# Patient Record
Sex: Female | Born: 1950 | Race: White | Hispanic: No | Marital: Married | Smoking: Former smoker
Health system: Southern US, Community
[De-identification: ages and names within clinical notes are randomized; demographics above are authoritative.]

## PROBLEM LIST (undated history)

## (undated) DIAGNOSIS — I2510 Atherosclerotic heart disease of native coronary artery without angina pectoris: Secondary | ICD-10-CM

## (undated) DIAGNOSIS — D649 Anemia, unspecified: Secondary | ICD-10-CM

## (undated) DIAGNOSIS — F419 Anxiety disorder, unspecified: Secondary | ICD-10-CM

## (undated) DIAGNOSIS — M1990 Unspecified osteoarthritis, unspecified site: Secondary | ICD-10-CM

## (undated) DIAGNOSIS — D689 Coagulation defect, unspecified: Secondary | ICD-10-CM

## (undated) DIAGNOSIS — J449 Chronic obstructive pulmonary disease, unspecified: Secondary | ICD-10-CM

## (undated) DIAGNOSIS — I10 Essential (primary) hypertension: Secondary | ICD-10-CM

## (undated) DIAGNOSIS — I739 Peripheral vascular disease, unspecified: Secondary | ICD-10-CM

## (undated) HISTORY — DX: Anxiety disorder, unspecified: F41.9

## (undated) HISTORY — PX: TRIGGER FINGER RELEASE: SHX641

## (undated) HISTORY — DX: Chronic obstructive pulmonary disease, unspecified: J44.9

## (undated) HISTORY — PX: TUBAL LIGATION: SHX77

## (undated) HISTORY — DX: Anemia, unspecified: D64.9

## (undated) HISTORY — DX: Coagulation defect, unspecified: D68.9

## (undated) HISTORY — DX: Atherosclerotic heart disease of native coronary artery without angina pectoris: I25.10

## (undated) HISTORY — PX: TOOTH EXTRACTION: SUR596

## (undated) HISTORY — DX: Peripheral vascular disease, unspecified: I73.9

---

## 1997-06-24 ENCOUNTER — Other Ambulatory Visit: Admission: RE | Admit: 1997-06-24 | Discharge: 1997-06-24 | Payer: Self-pay | Admitting: Gynecology

## 1998-01-04 ENCOUNTER — Other Ambulatory Visit: Admission: RE | Admit: 1998-01-04 | Discharge: 1998-01-04 | Payer: Self-pay | Admitting: Gynecology

## 1998-07-21 ENCOUNTER — Other Ambulatory Visit: Admission: RE | Admit: 1998-07-21 | Discharge: 1998-07-21 | Payer: Self-pay | Admitting: Gynecology

## 1999-07-24 ENCOUNTER — Other Ambulatory Visit: Admission: RE | Admit: 1999-07-24 | Discharge: 1999-07-24 | Payer: Self-pay | Admitting: Gynecology

## 1999-08-07 ENCOUNTER — Other Ambulatory Visit: Admission: RE | Admit: 1999-08-07 | Discharge: 1999-08-07 | Payer: Self-pay | Admitting: Gynecology

## 2000-08-20 ENCOUNTER — Other Ambulatory Visit: Admission: RE | Admit: 2000-08-20 | Discharge: 2000-08-20 | Payer: Self-pay | Admitting: Gynecology

## 2001-10-16 ENCOUNTER — Other Ambulatory Visit: Admission: RE | Admit: 2001-10-16 | Discharge: 2001-10-16 | Payer: Self-pay | Admitting: Gynecology

## 2001-12-09 ENCOUNTER — Encounter: Payer: Self-pay | Admitting: Family Medicine

## 2001-12-09 ENCOUNTER — Ambulatory Visit (HOSPITAL_COMMUNITY): Admission: RE | Admit: 2001-12-09 | Discharge: 2001-12-09 | Payer: Self-pay | Admitting: Family Medicine

## 2002-11-09 ENCOUNTER — Other Ambulatory Visit: Admission: RE | Admit: 2002-11-09 | Discharge: 2002-11-09 | Payer: Self-pay | Admitting: Gynecology

## 2003-01-07 ENCOUNTER — Encounter: Payer: Self-pay | Admitting: Family Medicine

## 2003-01-07 ENCOUNTER — Ambulatory Visit (HOSPITAL_COMMUNITY): Admission: RE | Admit: 2003-01-07 | Discharge: 2003-01-07 | Payer: Self-pay | Admitting: Family Medicine

## 2003-03-31 ENCOUNTER — Ambulatory Visit (HOSPITAL_COMMUNITY): Admission: RE | Admit: 2003-03-31 | Discharge: 2003-03-31 | Payer: Self-pay | Admitting: Family Medicine

## 2003-11-08 ENCOUNTER — Other Ambulatory Visit: Admission: RE | Admit: 2003-11-08 | Discharge: 2003-11-08 | Payer: Self-pay | Admitting: Gynecology

## 2004-01-31 ENCOUNTER — Ambulatory Visit (HOSPITAL_COMMUNITY): Admission: RE | Admit: 2004-01-31 | Discharge: 2004-01-31 | Payer: Self-pay | Admitting: Family Medicine

## 2004-02-21 ENCOUNTER — Ambulatory Visit: Payer: Self-pay | Admitting: Internal Medicine

## 2004-02-23 ENCOUNTER — Ambulatory Visit (HOSPITAL_COMMUNITY): Admission: RE | Admit: 2004-02-23 | Discharge: 2004-02-23 | Payer: Self-pay | Admitting: Internal Medicine

## 2004-02-23 HISTORY — PX: COLONOSCOPY: SHX174

## 2004-05-08 ENCOUNTER — Ambulatory Visit (HOSPITAL_COMMUNITY): Admission: RE | Admit: 2004-05-08 | Discharge: 2004-05-08 | Payer: Self-pay | Admitting: Family Medicine

## 2004-08-09 ENCOUNTER — Ambulatory Visit (HOSPITAL_COMMUNITY): Admission: RE | Admit: 2004-08-09 | Discharge: 2004-08-09 | Payer: Self-pay | Admitting: Family Medicine

## 2004-12-21 ENCOUNTER — Other Ambulatory Visit: Admission: RE | Admit: 2004-12-21 | Discharge: 2004-12-21 | Payer: Self-pay | Admitting: Gynecology

## 2005-12-11 ENCOUNTER — Ambulatory Visit (HOSPITAL_COMMUNITY): Admission: RE | Admit: 2005-12-11 | Discharge: 2005-12-11 | Payer: Self-pay | Admitting: Family Medicine

## 2006-04-22 ENCOUNTER — Ambulatory Visit (HOSPITAL_COMMUNITY): Admission: RE | Admit: 2006-04-22 | Discharge: 2006-04-22 | Payer: Self-pay | Admitting: Family Medicine

## 2006-11-20 ENCOUNTER — Ambulatory Visit (HOSPITAL_COMMUNITY): Admission: RE | Admit: 2006-11-20 | Discharge: 2006-11-20 | Payer: Self-pay | Admitting: Family Medicine

## 2009-02-21 ENCOUNTER — Ambulatory Visit: Payer: Self-pay | Admitting: Cardiology

## 2009-10-11 ENCOUNTER — Encounter (HOSPITAL_COMMUNITY): Admission: RE | Admit: 2009-10-11 | Discharge: 2009-11-10 | Payer: Self-pay | Admitting: Family Medicine

## 2009-11-22 ENCOUNTER — Ambulatory Visit (HOSPITAL_COMMUNITY): Admission: RE | Admit: 2009-11-22 | Discharge: 2009-11-22 | Payer: Self-pay | Admitting: Family Medicine

## 2010-02-23 ENCOUNTER — Ambulatory Visit (HOSPITAL_COMMUNITY): Admission: RE | Admit: 2010-02-23 | Discharge: 2010-02-23 | Payer: Self-pay | Admitting: Family Medicine

## 2010-08-11 NOTE — Op Note (Signed)
Desiree Barber, Desiree Barber             ACCOUNT NO.:  [id number]   MEDICAL RECORD NO.:  [id number]          PATIENT TYPE:  AMB   LOCATION:  DAY                           FACILITY:  [facility]   PHYSICIAN:  R. Roetta Sessions, M.D. DATE OF BIRTH:  25-Feb-1951   DATE OF PROCEDURE:  02/23/2004  DATE OF DISCHARGE:                                 OPERATIVE REPORT   PROCEDURE:  Screening colonoscopy.   INDICATIONS FOR PROCEDURE:  A 60 year old lady sent over at the courtesy of  Dr. Phillips Odor for colorectal cancer screening.  She has no GI symptoms.  There  is no family history of colorectal neoplasia.  She has never had a  colonoscopy or other imaging of her colon.  This colonoscopy is being done  as a standard screening maneuver.  This approach has been discussed with the  patient at length.  Potential risks, benefits, and alternatives have been  reviewed, questions answered.  She is agreeable.   PROCEDURE NOTE:  O2 saturations, blood pressure, pulse, and respirations  were monitored throughout the entire procedure.   CONSCIOUS SEDATION:  Versed and Demerol in incremental doses intravenously.   INSTRUMENT:  The Olympus video chip system (pediatric scope).   FINDINGS:  Digital rectal exam revealed a somewhat lax sphincter tone.   ENDOSCOPIC FINDINGS:  Prep was adequate.   RECTUM:  Examination of rectal mucosa including retroflexed view of the anal  verge revealed no abnormalities.   COLON:  Colonic mucosa was surveyed from the rectosigmoid junction through  the left transverse and right colon to the area of the appendiceal orifice,  ileocecal valve, and cecum.  These structures were well-seen, photographed  for the record.  From this level, the scope was slowly withdrawn.  All  previously mentioned mucosal surfaces were again seen.  The colonic mucosa  appeared normal.  The patient tolerated the procedure well, was reacted in  endoscopy.   IMPRESSION:  1.  Normal rectum aside from internal  hemorrhoids (minimal).  2.  Normal colon.   RECOMMENDATIONS:  Repeat colonoscopy 10 years.     Otelia Sergeant   RMR/MEDQ  D:  02/23/2004  T:  02/23/2004  Job:  [id number]   cc:   Corrie Mckusick, M.D.  Fax: [phone]

## 2012-06-23 ENCOUNTER — Other Ambulatory Visit (HOSPITAL_COMMUNITY): Payer: Self-pay | Admitting: Family Medicine

## 2012-06-23 ENCOUNTER — Ambulatory Visit (HOSPITAL_COMMUNITY)
Admission: RE | Admit: 2012-06-23 | Discharge: 2012-06-23 | Disposition: A | Discharge status: Home / Self Care | Payer: Managed Care, Other (non HMO) | Source: Ambulatory Visit | Attending: Family Medicine | Admitting: Family Medicine

## 2012-06-23 DIAGNOSIS — R509 Fever, unspecified: Secondary | ICD-10-CM | POA: Insufficient documentation

## 2012-06-23 DIAGNOSIS — J984 Other disorders of lung: Secondary | ICD-10-CM | POA: Insufficient documentation

## 2012-06-23 DIAGNOSIS — J069 Acute upper respiratory infection, unspecified: Secondary | ICD-10-CM | POA: Insufficient documentation

## 2012-06-23 DIAGNOSIS — R05 Cough: Secondary | ICD-10-CM | POA: Insufficient documentation

## 2012-06-23 DIAGNOSIS — R059 Cough, unspecified: Secondary | ICD-10-CM | POA: Insufficient documentation

## 2013-08-11 ENCOUNTER — Encounter (INDEPENDENT_AMBULATORY_CARE_PROVIDER_SITE_OTHER): Payer: Self-pay

## 2013-08-11 ENCOUNTER — Other Ambulatory Visit (HOSPITAL_COMMUNITY): Payer: Self-pay | Admitting: Family Medicine

## 2013-08-11 ENCOUNTER — Ambulatory Visit (HOSPITAL_COMMUNITY)
Admission: RE | Admit: 2013-08-11 | Discharge: 2013-08-11 | Disposition: A | Discharge status: Home / Self Care | Payer: Managed Care, Other (non HMO) | Source: Ambulatory Visit | Attending: Family Medicine | Admitting: Family Medicine

## 2013-08-11 DIAGNOSIS — J449 Chronic obstructive pulmonary disease, unspecified: Secondary | ICD-10-CM

## 2013-08-11 DIAGNOSIS — Z87891 Personal history of nicotine dependence: Secondary | ICD-10-CM | POA: Insufficient documentation

## 2013-08-11 DIAGNOSIS — R05 Cough: Secondary | ICD-10-CM | POA: Insufficient documentation

## 2013-08-11 DIAGNOSIS — R0602 Shortness of breath: Secondary | ICD-10-CM | POA: Insufficient documentation

## 2013-08-11 DIAGNOSIS — R059 Cough, unspecified: Secondary | ICD-10-CM | POA: Insufficient documentation

## 2015-04-28 ENCOUNTER — Telehealth: Payer: Self-pay

## 2015-04-28 NOTE — Telephone Encounter (Signed)
Pt called to say that she can't seem to get a hold of DS and was wanting to get her procedure scheduled. I told her that DS said to give her 10-15 minutes that she would try to call her back that she couldn't triage at the moment. Pt said that she and husband are waiting to go out, but would wait for DS to call back first. Please call her at [phone]

## 2015-04-28 NOTE — Telephone Encounter (Signed)
See separate triage.  

## 2015-05-03 ENCOUNTER — Telehealth: Payer: Self-pay

## 2015-05-03 NOTE — Telephone Encounter (Signed)
Gastroenterology Pre-Procedure Review  Request Date:04/28/2015 Requesting Physician: Delman Cheadle, PA  PATIENT REVIEW QUESTIONS: The patient responded to the following health history questions as indicated:    1. Diabetes Melitis: no 2. Joint replacements in the past 12 months: no 3. Major health problems in the past 3 months: no 4. Has an artificial valve or MVP: no 5. Has a defibrillator: no 6. Has been advised in past to take antibiotics in advance of a procedure like teeth cleaning: no 7. Family history of colon cancer: no  8. Alcohol Use: Drinks about 3 beers daily 9. History of sleep apnea: no     MEDICATIONS & ALLERGIES:    Patient reports the following regarding taking any blood thinners:   Plavix? no Aspirin? no Coumadin? no  Patient confirms/reports the following medications:  Current Outpatient Prescriptions  Medication Sig Dispense Refill  . ALPRAZolam (XANAX) 0.25 MG tablet Take 0.25 mg by mouth at bedtime as needed for anxiety.    Marland Kitchen tiotropium (SPIRIVA) 18 MCG inhalation capsule Place 18 mcg into inhaler and inhale daily.     No current facility-administered medications for this visit.    Patient confirms/reports the following allergies:  Allergies  Allergen Reactions  . Demerol [Meperidine]   . Oxycontin [Oxycodone Hcl]     No orders of the defined types were placed in this encounter.    AUTHORIZATION INFORMATION Primary Insurance:  ID #:   Group #:  Pre-Cert / Auth required:  Pre-Cert / Auth #:   Secondary Insurance:   ID #:   Group #:  Pre-Cert / Auth required:  Pre-Cert / Auth #:   SCHEDULE INFORMATION: Procedure has been scheduled as follows:  Date:             Time:  Location:   This Gastroenterology Pre-Precedure Review Form is being routed to the following provider(s): R. Garfield Cornea, MD

## 2015-05-04 NOTE — Telephone Encounter (Signed)
Pt said she drinks 12 oz beers and now she says that it's probably 1-2 a day and sometimes not any. She just likes the taste of the beer. She has been doing this for about 20 years.  She said she did not have any problems with sedation before, but she did get deathly sick on the Demerol. She said pain meds make her very sick on her stomach.  Please advise!

## 2015-05-04 NOTE — Telephone Encounter (Signed)
Please find out what size beers and how long she has been doing this. Did she have any issues with not being sedated adequately with last TCS?

## 2015-05-04 NOTE — Telephone Encounter (Signed)
Let's bring her in to discuss her sedation. May be best as a proprofol candidate.

## 2015-05-06 NOTE — Telephone Encounter (Signed)
Pt is aware and appt scheduled with Walden Field, NP on 05/18/2015 at 2:30 PM.

## 2015-05-18 ENCOUNTER — Ambulatory Visit: Payer: Managed Care, Other (non HMO) | Admitting: Nurse Practitioner

## 2015-05-18 ENCOUNTER — Ambulatory Visit (INDEPENDENT_AMBULATORY_CARE_PROVIDER_SITE_OTHER): Payer: BLUE CROSS/BLUE SHIELD | Admitting: Nurse Practitioner

## 2015-05-18 ENCOUNTER — Other Ambulatory Visit: Payer: Self-pay

## 2015-05-18 ENCOUNTER — Encounter: Payer: Self-pay | Admitting: Nurse Practitioner

## 2015-05-18 VITALS — BP 174/91 | HR 79 | Temp 97.4°F | Ht 64.0 in | Wt 103.8 lb

## 2015-05-18 DIAGNOSIS — Z1211 Encounter for screening for malignant neoplasm of colon: Secondary | ICD-10-CM | POA: Diagnosis not present

## 2015-05-18 DIAGNOSIS — Z886 Allergy status to analgesic agent status: Secondary | ICD-10-CM | POA: Insufficient documentation

## 2015-05-18 MED ORDER — PEG 3350-KCL-NA BICARB-NACL 420 G PO SOLR: 4000.0000 mL | Freq: Once | ORAL | Status: DC

## 2015-05-18 NOTE — Assessment & Plan Note (Signed)
Patient due for routine screening colonoscopy. Was offered an office visit due to daily alcohol consumption and allergy to Demerol. States she drinks about 2-3 beers a day for the past 20 years. Is also currently on Xanax. Generally asymptomatic from a GI standpoint. We will proceed with screening colonoscopy on propofol.  Proceed with TCS in the OR with propofol/MAC with Dr. Gala Romney in near future: the risks, benefits, and alternatives have been discussed with the patient in detail. The patient states understanding and desires to proceed.  The patient is not on any anticoagulants, chronic pain medications, or antidepressants. Daily alcohol consumption and Xanax along with allergy Demerol. We'll plan for the procedure and the OR on propofol/MAC to remove issue with allergy as well as promote adequate sedation given polypharmacy and daily alcohol consumption.

## 2015-05-18 NOTE — Progress Notes (Signed)
cc'ed to pcp °

## 2015-05-18 NOTE — Patient Instructions (Signed)
1. We will schedule your procedure for you. 2. Further recommendations to be based on results of your procedure. 3. Return for follow-up as needed for GI symptoms or based on post procedure recommendations.

## 2015-05-18 NOTE — Progress Notes (Signed)
Primary Care Physician:  Purvis Kilts, MD Primary Gastroenterologist:  Dr. Gala Romney   Chief Complaint  Patient presents with  . Colonoscopy    HPI:   Desiree Barber is a 65 y.o. female who presents to schedule colonoscopy. Phone triage was initially attempted but she was referred for office visit due to daily beer consumption and reaction to Demerol including nausea and vomiting. Previous colonoscopy completed 02/23/2004 which found normal rectum aside from internal hemorrhoids, normal colon. Recommend repeat colonoscopy in 10 years (2015). She is 2 years overdue for her repeat colonoscopy.  Today she states she's allergic to Demerol and has severe N/V on it. Denies abdominal pain, N/V, hematochezia, melena, unintentional weight loss, fever, chills, changes in bowel habits. Denies chest pain, dyspnea, dizziness, lightheadedness, syncope, near syncope. Denies any other upper or lower GI symptoms.  Past Medical History  Diagnosis Date  . COPD (chronic obstructive pulmonary disease) ([place])   . Anxiety     Occasional    Past Surgical History  Procedure Laterality Date  . Colonoscopy  02/23/04    RMR: normal rectum and colon.minimal internal hemorrhoids    Current Outpatient Prescriptions  Medication Sig Dispense Refill  . ALPRAZolam (XANAX) 0.25 MG tablet Take 0.25 mg by mouth at bedtime as needed for anxiety.    Marland Kitchen tiotropium (SPIRIVA) 18 MCG inhalation capsule Place 18 mcg into inhaler and inhale daily.     No current facility-administered medications for this visit.    Allergies as of 05/18/2015 - Review Complete 05/18/2015  Allergen Reaction Noted  . Demerol [meperidine]  05/03/2015  . Oxycontin [oxycodone hcl]  05/03/2015    Family History  Problem Relation Age of Onset  . Colon cancer Neg Hx     Social History   Social History  . Marital Status: Married    Spouse Name: N/A  . Number of Children: N/A  . Years of Education: N/A   Occupational History    . Not on file.   Social History Main Topics  . Smoking status: Current Every Day Smoker -- 0.50 packs/day    Types: Cigarettes  . Smokeless tobacco: Never Used  . Alcohol Use: 0.0 oz/week    0 Standard drinks or equivalent per week     Comment: 3 beers a day  . Drug Use: No  . Sexual Activity: Not on file   Other Topics Concern  . Not on file   Social History Narrative    Review of Systems: General: Negative for anorexia, weight loss, fever, chills, fatigue, weakness. Eyes: Negative for vision changes.  ENT: Negative for hoarseness, difficulty swallowing. CV: Negative for chest pain, angina, palpitations, peripheral edema.  Respiratory: Negative for dyspnea at rest, cough, sputum, wheezing.  GI: See history of present illness. Derm: Negative for rash or itching.  Endo: Negative for unusual weight change.  Heme: Negative for bruising or bleeding. Allergy: Negative for rash or hives.    Physical Exam: BP 174/91 mmHg  Pulse 79  Temp(Src) 97.4 F (36.3 C) (Oral)  Ht 5\' 4"  (1.626 m)  Wt 103 lb 12.8 oz (47.083 kg)  BMI 17.81 kg/m2 General:   Alert and oriented. Pleasant and cooperative. Well-nourished and well-developed.  Head:  Normocephalic and atraumatic. Eyes:  Without icterus, sclera clear and conjunctiva pink.  Ears:  Normal auditory acuity. Cardiovascular:  S1, S2 present without murmurs appreciated. Extremities without clubbing or edema. Respiratory:  Clear to auscultation bilaterally. No wheezes, rales, or rhonchi. No distress.  Gastrointestinal:  +  BS, soft, non-tender and non-distended. No HSM noted. No guarding or rebound. No masses appreciated.  Rectal:  Deferred  Musculoskalatal:  Symmetrical without gross deformities. Neurologic:  Alert and oriented x4;  grossly normal neurologically. Psych:  Alert and cooperative. Normal mood and affect. Heme/Lymph/Immune: No excessive bruising noted.    05/18/2015 2:45 PM

## 2015-05-19 ENCOUNTER — Telehealth: Payer: Self-pay

## 2015-05-19 NOTE — Telephone Encounter (Signed)
Per Elta Guadeloupe at [employer] is required

## 2015-06-07 NOTE — Patient Instructions (Signed)
Desiree Barber  06/07/2015     @PREFPERIOPPHARMACY @   Your procedure is scheduled on  06/13/2015  Report to [facility] at  915  A.M.  Call this number if you have problems the morning of surgery:  [phone]   Remember:  Do not eat food or drink liquids after midnight.  Take these medicines the morning of surgery with A SIP OF WATER  Xanax. Take your spiriva before you come.   Do not wear jewelry, make-up or nail polish.  Do not wear lotions, powders, or perfumes.  You may wear deodorant.  Do not shave 48 hours prior to surgery.  Men may shave face and neck.  Do not bring valuables to the hospital.  [facility] is not responsible for any belongings or valuables.  Contacts, dentures or bridgework may not be worn into surgery.  Leave your suitcase in the car.  After surgery it may be brought to your room.  For patients admitted to the hospital, discharge time will be determined by your treatment team.  Patients discharged the day of surgery will not be allowed to drive home.   Name and phone number of your driver:   family Special instructions:  Follow the diet and prep instructions given to you by Dr Roseanne Kaufman office.  Please read over the following fact sheets that you were given. Coughing and Deep Breathing, Surgical Site Infection Prevention, Anesthesia Post-op Instructions and Care and Recovery After Surgery      Colonoscopy A colonoscopy is an exam to look at the entire large intestine (colon). This exam can help find problems such as tumors, polyps, inflammation, and areas of bleeding. The exam takes about 1 hour.  LET [facility] CARE PROVIDER KNOW ABOUT:   Any allergies you have.  All medicines you are taking, including vitamins, herbs, eye drops, creams, and over-the-counter medicines.  Previous problems you or members of your family have had with the use of anesthetics.  Any blood disorders you have.  Previous surgeries you have had.  Medical  conditions you have. RISKS AND COMPLICATIONS  Generally, this is a safe procedure. However, as with any procedure, complications can occur. Possible complications include:  Bleeding.  Tearing or rupture of the colon wall.  Reaction to medicines given during the exam.  Infection (rare). BEFORE THE PROCEDURE   Ask your health care provider about changing or stopping your regular medicines.  You may be prescribed an oral bowel prep. This involves drinking a large amount of medicated liquid, starting the day before your procedure. The liquid will cause you to have multiple loose stools until your stool is almost clear or light green. This cleans out your colon in preparation for the procedure.  Do not eat or drink anything else once you have started the bowel prep, unless your health care provider tells you it is safe to do so.  Arrange for someone to drive you home after the procedure. PROCEDURE   You will be given medicine to help you relax (sedative).  You will lie on your side with your knees bent.  A long, flexible tube with a light and camera on the end (colonoscope) will be inserted through the rectum and into the colon. The camera sends video back to a computer screen as it moves through the colon. The colonoscope also releases carbon dioxide gas to inflate the colon. This helps your health care provider see the area better.  During the exam,  your health care provider may take a small tissue sample (biopsy) to be examined under a microscope if any abnormalities are found.  The exam is finished when the entire colon has been viewed. AFTER THE PROCEDURE   Do not drive for 24 hours after the exam.  You may have a small amount of blood in your stool.  You may pass moderate amounts of gas and have mild abdominal cramping or bloating. This is caused by the gas used to inflate your colon during the exam.  Ask when your test results will be ready and how you will get your results.  Make sure you get your test results.   This information is not intended to replace advice given to you by your health care provider. Make sure you discuss any questions you have with your health care provider.   Document Released: 03/09/2000 Document Revised: 12/31/2012 Document Reviewed: 11/17/2012 Elsevier Interactive Patient Education 2016 [employer] Colonoscopy, Care After Refer to this sheet in the next few weeks. These instructions provide you with information on caring for yourself after your procedure. Your health care provider may also give you more specific instructions. Your treatment has been planned according to current medical practices, but problems sometimes occur. Call your health care provider if you have any problems or questions after your procedure. WHAT TO EXPECT AFTER THE PROCEDURE  After your procedure, it is typical to have the following:  A small amount of blood in your stool.  Moderate amounts of gas and mild abdominal cramping or bloating. HOME CARE INSTRUCTIONS  Do not drive, operate machinery, or sign important documents for 24 hours.  You may shower and resume your regular physical activities, but move at a slower pace for the first 24 hours.  Take frequent rest periods for the first 24 hours.  Walk around or put a warm pack on your abdomen to help reduce abdominal cramping and bloating.  Drink enough fluids to keep your urine clear or pale yellow.  You may resume your normal diet as instructed by your health care provider. Avoid heavy or fried foods that are hard to digest.  Avoid drinking alcohol for 24 hours or as instructed by your health care provider.  Only take over-the-counter or prescription medicines as directed by your health care provider.  If a tissue sample (biopsy) was taken during your procedure:  Do not take aspirin or blood thinners for 7 days, or as instructed by your health care provider.  Do not drink alcohol for 7 days, or  as instructed by your health care provider.  Eat soft foods for the first 24 hours. SEEK MEDICAL CARE IF: You have persistent spotting of blood in your stool 2-3 days after the procedure. SEEK IMMEDIATE MEDICAL CARE IF:  You have more than a small spotting of blood in your stool.  You pass large blood clots in your stool.  Your abdomen is swollen (distended).  You have nausea or vomiting.  You have a fever.  You have increasing abdominal pain that is not relieved with medicine.   This information is not intended to replace advice given to you by your health care provider. Make sure you discuss any questions you have with your health care provider.   Document Released: 10/25/2003 Document Revised: 12/31/2012 Document Reviewed: 11/17/2012 Elsevier Interactive Patient Education 2016 Elsevier Inc[employer] PATIENT INSTRUCTIONS POST-ANESTHESIA  IMMEDIATELY FOLLOWING SURGERY:  Do not drive or operate machinery for the first twenty four hours after surgery.  Do not make any  important decisions for twenty four hours after surgery or while taking narcotic pain medications or sedatives.  If you develop intractable nausea and vomiting or a severe headache please notify your doctor immediately.  FOLLOW-UP:  Please make an appointment with your surgeon as instructed. You do not need to follow up with anesthesia unless specifically instructed to do so.  WOUND CARE INSTRUCTIONS (if applicable):  Keep a dry clean dressing on the anesthesia/puncture wound site if there is drainage.  Once the wound has quit draining you may leave it open to air.  Generally you should leave the bandage intact for twenty four hours unless there is drainage.  If the epidural site drains for more than 36-48 hours please call the anesthesia department.  QUESTIONS?:  Please feel free to call your physician or the hospital operator if you have any questions, and they will be happy to assist you.

## 2015-06-08 ENCOUNTER — Other Ambulatory Visit: Payer: Self-pay

## 2015-06-08 ENCOUNTER — Encounter (HOSPITAL_COMMUNITY): Payer: Self-pay

## 2015-06-08 ENCOUNTER — Encounter (HOSPITAL_COMMUNITY)
Admission: RE | Admit: 2015-06-08 | Discharge: 2015-06-08 | Disposition: A | Discharge status: Home / Self Care | Payer: BLUE CROSS/BLUE SHIELD | Source: Ambulatory Visit | Attending: Internal Medicine | Admitting: Internal Medicine

## 2015-06-08 DIAGNOSIS — Z01810 Encounter for preprocedural cardiovascular examination: Secondary | ICD-10-CM | POA: Insufficient documentation

## 2015-06-08 DIAGNOSIS — Z01812 Encounter for preprocedural laboratory examination: Secondary | ICD-10-CM | POA: Diagnosis present

## 2015-06-08 LAB — CBC WITH DIFFERENTIAL/PLATELET
BASOS ABS: 0 10*3/uL (ref 0.0–0.1)
BASOS PCT: 0 %
Eosinophils Absolute: 0.2 10*3/uL (ref 0.0–0.7)
Eosinophils Relative: 2 %
HEMATOCRIT: 49.2 % — AB (ref 36.0–46.0)
HEMOGLOBIN: 16 g/dL — AB (ref 12.0–15.0)
LYMPHS PCT: 17 %
Lymphs Abs: 1.7 10*3/uL (ref 0.7–4.0)
MCH: 32 pg (ref 26.0–34.0)
MCHC: 32.5 g/dL (ref 30.0–36.0)
MCV: 98.4 fL (ref 78.0–100.0)
MONOS PCT: 10 %
Monocytes Absolute: 0.9 10*3/uL (ref 0.1–1.0)
NEUTROS ABS: 6.8 10*3/uL (ref 1.7–7.7)
NEUTROS PCT: 71 %
Platelets: 347 10*3/uL (ref 150–400)
RBC: 5 MIL/uL (ref 3.87–5.11)
RDW: 13.6 % (ref 11.5–15.5)
WBC: 9.5 10*3/uL (ref 4.0–10.5)

## 2015-06-08 LAB — BASIC METABOLIC PANEL
ANION GAP: 6 (ref 5–15)
BUN: 6 mg/dL (ref 6–20)
CHLORIDE: 102 mmol/L (ref 101–111)
CO2: 29 mmol/L (ref 22–32)
Calcium: 8.7 mg/dL — ABNORMAL LOW (ref 8.9–10.3)
Creatinine, Ser: 0.58 mg/dL (ref 0.44–1.00)
GFR calc non Af Amer: >60 mL/min (ref >60)
Glucose, Bld: 85 mg/dL (ref 65–99)
POTASSIUM: 4.4 mmol/L (ref 3.5–5.1)
Sodium: 137 mmol/L (ref 135–145)

## 2015-06-08 NOTE — Pre-Procedure Instructions (Signed)
Patient given information to sign up for my chart at home. 

## 2015-06-13 ENCOUNTER — Ambulatory Visit (HOSPITAL_COMMUNITY): Payer: BLUE CROSS/BLUE SHIELD | Admitting: Anesthesiology

## 2015-06-13 ENCOUNTER — Encounter (HOSPITAL_COMMUNITY): Payer: Self-pay | Admitting: *Deleted

## 2015-06-13 ENCOUNTER — Ambulatory Visit (HOSPITAL_COMMUNITY)
Admission: RE | Admit: 2015-06-13 | Discharge: 2015-06-13 | Disposition: A | Discharge status: Home / Self Care | Payer: BLUE CROSS/BLUE SHIELD | Source: Ambulatory Visit | Attending: Internal Medicine | Admitting: Internal Medicine

## 2015-06-13 ENCOUNTER — Encounter (HOSPITAL_COMMUNITY)
Admission: RE | Disposition: A | Discharge status: Home / Self Care | Payer: Self-pay | Source: Ambulatory Visit | Attending: Internal Medicine

## 2015-06-13 DIAGNOSIS — Z1211 Encounter for screening for malignant neoplasm of colon: Secondary | ICD-10-CM | POA: Insufficient documentation

## 2015-06-13 DIAGNOSIS — Z86010 Personal history of colonic polyps: Secondary | ICD-10-CM | POA: Insufficient documentation

## 2015-06-13 DIAGNOSIS — D125 Benign neoplasm of sigmoid colon: Secondary | ICD-10-CM | POA: Insufficient documentation

## 2015-06-13 DIAGNOSIS — Z79899 Other long term (current) drug therapy: Secondary | ICD-10-CM | POA: Diagnosis not present

## 2015-06-13 DIAGNOSIS — F419 Anxiety disorder, unspecified: Secondary | ICD-10-CM | POA: Insufficient documentation

## 2015-06-13 DIAGNOSIS — J449 Chronic obstructive pulmonary disease, unspecified: Secondary | ICD-10-CM | POA: Insufficient documentation

## 2015-06-13 DIAGNOSIS — K5730 Diverticulosis of large intestine without perforation or abscess without bleeding: Secondary | ICD-10-CM | POA: Diagnosis not present

## 2015-06-13 DIAGNOSIS — Z888 Allergy status to other drugs, medicaments and biological substances status: Secondary | ICD-10-CM | POA: Diagnosis not present

## 2015-06-13 HISTORY — PX: COLONOSCOPY WITH PROPOFOL: SHX5780

## 2015-06-13 HISTORY — PX: POLYPECTOMY: SHX5525

## 2015-06-13 SURGERY — COLONOSCOPY WITH PROPOFOL
Anesthesia: Monitor Anesthesia Care

## 2015-06-13 MED ORDER — DEXAMETHASONE SODIUM PHOSPHATE 4 MG/ML IJ SOLN
4.0000 mg | Freq: Once | INTRAMUSCULAR | Status: AC
  Administered 2015-06-13: 4 mg via INTRAVENOUS
  Filled 2015-06-13: qty 1

## 2015-06-13 MED ORDER — FENTANYL CITRATE (PF) 100 MCG/2ML IJ SOLN: 25.0000 ug | INTRAMUSCULAR | Status: DC | PRN

## 2015-06-13 MED ORDER — GLYCOPYRROLATE 0.2 MG/ML IJ SOLN
0.2000 mg | Freq: Once | INTRAMUSCULAR | Status: AC
  Administered 2015-06-13: 0.2 mg via INTRAVENOUS

## 2015-06-13 MED ORDER — PROPOFOL 10 MG/ML IV BOLUS
INTRAVENOUS | Status: DC | PRN
  Administered 2015-06-13 (×2): 10 mg via INTRAVENOUS
  Administered 2015-06-13: 20 mg via INTRAVENOUS

## 2015-06-13 MED ORDER — PROPOFOL 10 MG/ML IV BOLUS
INTRAVENOUS | Status: AC
  Filled 2015-06-13: qty 40

## 2015-06-13 MED ORDER — DEXAMETHASONE SODIUM PHOSPHATE 4 MG/ML IJ SOLN
INTRAMUSCULAR | Status: AC
  Filled 2015-06-13: qty 1

## 2015-06-13 MED ORDER — MIDAZOLAM HCL 2 MG/2ML IJ SOLN
INTRAMUSCULAR | Status: AC
  Filled 2015-06-13: qty 2

## 2015-06-13 MED ORDER — MIDAZOLAM HCL 2 MG/2ML IJ SOLN
1.0000 mg | INTRAMUSCULAR | Status: DC | PRN
  Administered 2015-06-13: 2 mg via INTRAVENOUS

## 2015-06-13 MED ORDER — ONDANSETRON HCL 4 MG/2ML IJ SOLN
4.0000 mg | Freq: Once | INTRAMUSCULAR | Status: AC
  Administered 2015-06-13: 4 mg via INTRAVENOUS

## 2015-06-13 MED ORDER — PROPOFOL 500 MG/50ML IV EMUL
INTRAVENOUS | Status: DC | PRN
  Administered 2015-06-13: 12:00:00 via INTRAVENOUS
  Administered 2015-06-13: 125 ug/kg/min via INTRAVENOUS

## 2015-06-13 MED ORDER — FENTANYL CITRATE (PF) 100 MCG/2ML IJ SOLN
25.0000 ug | Freq: Once | INTRAMUSCULAR | Status: AC
  Administered 2015-06-13: 25 ug via INTRAVENOUS

## 2015-06-13 MED ORDER — ONDANSETRON HCL 4 MG/2ML IJ SOLN
INTRAMUSCULAR | Status: AC
  Filled 2015-06-13: qty 2

## 2015-06-13 MED ORDER — ONDANSETRON HCL 4 MG/2ML IJ SOLN: 4.0000 mg | Freq: Once | INTRAMUSCULAR | Status: DC | PRN

## 2015-06-13 MED ORDER — GLYCOPYRROLATE 0.2 MG/ML IJ SOLN
INTRAMUSCULAR | Status: AC
  Filled 2015-06-13: qty 1

## 2015-06-13 MED ORDER — FENTANYL CITRATE (PF) 100 MCG/2ML IJ SOLN
INTRAMUSCULAR | Status: AC
  Filled 2015-06-13: qty 2

## 2015-06-13 MED ORDER — LACTATED RINGERS IV SOLN
INTRAVENOUS | Status: DC
  Administered 2015-06-13 (×2): via INTRAVENOUS

## 2015-06-13 NOTE — Anesthesia Postprocedure Evaluation (Signed)
Anesthesia Post Note  Patient: Desiree Barber  Procedure(s) Performed: Procedure(s) (LRB): COLONOSCOPY WITH PROPOFOL (N/A) POLYPECTOMY  Patient location during evaluation: PACU Anesthesia Type: MAC Level of consciousness: awake and alert Pain management: pain level controlled Respiratory status: spontaneous breathing Cardiovascular status: blood pressure returned to baseline Postop Assessment: no signs of nausea or vomiting Anesthetic complications: no    Last Vitals:  Filed Vitals:   06/13/15 1255 06/13/15 1306  BP:  141/57  Pulse: 91 91  Temp:  37.1 C  Resp: 17     Last Pain:  Filed Vitals:   06/13/15 1307  PainSc: 0-No pain                 Mahari Strahm

## 2015-06-13 NOTE — H&P (View-Only) (Signed)
Primary Care Physician:  Purvis Kilts, MD Primary Gastroenterologist:  Dr. Gala Romney   Chief Complaint  Patient presents with  . Colonoscopy    HPI:   Desiree Barber is a 65 y.o. female who presents to schedule colonoscopy. Phone triage was initially attempted but she was referred for office visit due to daily beer consumption and reaction to Demerol including nausea and vomiting. Previous colonoscopy completed 02/23/2004 which found normal rectum aside from internal hemorrhoids, normal colon. Recommend repeat colonoscopy in 10 years (2015). She is 2 years overdue for her repeat colonoscopy.  Today she states she's allergic to Demerol and has severe N/V on it. Denies abdominal pain, N/V, hematochezia, melena, unintentional weight loss, fever, chills, changes in bowel habits. Denies chest pain, dyspnea, dizziness, lightheadedness, syncope, near syncope. Denies any other upper or lower GI symptoms.  Past Medical History  Diagnosis Date  . COPD (chronic obstructive pulmonary disease) ([place])   . Anxiety     Occasional    Past Surgical History  Procedure Laterality Date  . Colonoscopy  02/23/04    RMR: normal rectum and colon.minimal internal hemorrhoids    Current Outpatient Prescriptions  Medication Sig Dispense Refill  . ALPRAZolam (XANAX) 0.25 MG tablet Take 0.25 mg by mouth at bedtime as needed for anxiety.    Marland Kitchen tiotropium (SPIRIVA) 18 MCG inhalation capsule Place 18 mcg into inhaler and inhale daily.     No current facility-administered medications for this visit.    Allergies as of 05/18/2015 - Review Complete 05/18/2015  Allergen Reaction Noted  . Demerol [meperidine]  05/03/2015  . Oxycontin [oxycodone hcl]  05/03/2015    Family History  Problem Relation Age of Onset  . Colon cancer Neg Hx     Social History   Social History  . Marital Status: Married    Spouse Name: N/A  . Number of Children: N/A  . Years of Education: N/A   Occupational History    . Not on file.   Social History Main Topics  . Smoking status: Current Every Day Smoker -- 0.50 packs/day    Types: Cigarettes  . Smokeless tobacco: Never Used  . Alcohol Use: 0.0 oz/week    0 Standard drinks or equivalent per week     Comment: 3 beers a day  . Drug Use: No  . Sexual Activity: Not on file   Other Topics Concern  . Not on file   Social History Narrative    Review of Systems: General: Negative for anorexia, weight loss, fever, chills, fatigue, weakness. Eyes: Negative for vision changes.  ENT: Negative for hoarseness, difficulty swallowing. CV: Negative for chest pain, angina, palpitations, peripheral edema.  Respiratory: Negative for dyspnea at rest, cough, sputum, wheezing.  GI: See history of present illness. Derm: Negative for rash or itching.  Endo: Negative for unusual weight change.  Heme: Negative for bruising or bleeding. Allergy: Negative for rash or hives.    Physical Exam: BP 174/91 mmHg  Pulse 79  Temp(Src) 97.4 F (36.3 C) (Oral)  Ht 5\' 4"  (1.626 m)  Wt 103 lb 12.8 oz (47.083 kg)  BMI 17.81 kg/m2 General:   Alert and oriented. Pleasant and cooperative. Well-nourished and well-developed.  Head:  Normocephalic and atraumatic. Eyes:  Without icterus, sclera clear and conjunctiva pink.  Ears:  Normal auditory acuity. Cardiovascular:  S1, S2 present without murmurs appreciated. Extremities without clubbing or edema. Respiratory:  Clear to auscultation bilaterally. No wheezes, rales, or rhonchi. No distress.  Gastrointestinal:  +  BS, soft, non-tender and non-distended. No HSM noted. No guarding or rebound. No masses appreciated.  Rectal:  Deferred  Musculoskalatal:  Symmetrical without gross deformities. Neurologic:  Alert and oriented x4;  grossly normal neurologically. Psych:  Alert and cooperative. Normal mood and affect. Heme/Lymph/Immune: No excessive bruising noted.    05/18/2015 2:45 PM

## 2015-06-13 NOTE — Anesthesia Preprocedure Evaluation (Signed)
Anesthesia Evaluation  Patient identified by MRN, date of birth, ID band Patient awake    Reviewed: Allergy & Precautions, NPO status , Patient's Chart, lab work & pertinent test results  Airway Mallampati: I  TM Distance: >3 FB     Dental  (+) Teeth Intact, Poor Dentition   Pulmonary COPD, Current Smoker,    breath sounds clear to auscultation- rhonchi       Cardiovascular negative cardio ROS   Rhythm:Regular Rate:Normal     Neuro/Psych PSYCHIATRIC DISORDERS Anxiety    GI/Hepatic negative GI ROS,   Endo/Other    Renal/GU      Musculoskeletal   Abdominal   Peds  Hematology   Anesthesia Other Findings   Reproductive/Obstetrics                             Anesthesia Physical Anesthesia Plan  ASA: III  Anesthesia Plan: MAC   Post-op Pain Management:    Induction: Intravenous  Airway Management Planned: Simple Face Mask  Additional Equipment:   Intra-op Plan:   Post-operative Plan:   Informed Consent: I have reviewed the patients History and Physical, chart, labs and discussed the procedure including the risks, benefits and alternatives for the proposed anesthesia with the patient or authorized representative who has indicated his/her understanding and acceptance.     Plan Discussed with:   Anesthesia Plan Comments:         Anesthesia Quick Evaluation

## 2015-06-13 NOTE — Transfer of Care (Signed)
Immediate Anesthesia Transfer of Care Note  Patient: Desiree Barber  Procedure(s) Performed: Procedure(s) with comments: COLONOSCOPY WITH PROPOFOL (N/A) - 1045 POLYPECTOMY - sigmoid colon polyp  Patient Location: PACU  Anesthesia Type:MAC  Level of Consciousness: awake  Airway & Oxygen Therapy: Patient Spontanous Breathing and Patient connected to face mask oxygen  Post-op Assessment: Report given to RN  Post vital signs: Reviewed and stable  Last Vitals:  Filed Vitals:   06/13/15 1135 06/13/15 1140  BP:  146/85  Pulse:    Temp:    Resp: 17 18    Complications: No apparent anesthesia complications

## 2015-06-13 NOTE — Interval H&P Note (Signed)
History and Physical Interval Note:  06/13/2015 11:38 AM  Desiree Barber  has presented today for surgery, with the diagnosis of screening colonoscopy  The various methods of treatment have been discussed with the patient and family. After consideration of risks, benefits and other options for treatment, the patient has consented to  Procedure(s) with comments: COLONOSCOPY WITH PROPOFOL (N/A) - 1045 as a surgical intervention .  The patient's history has been reviewed, patient examined, no change in status, stable for surgery.  I have reviewed the patient's chart and labs.  Questions were answered to the patient's satisfaction.     Desiree Barber  No change. Screening colonoscopy per plan.The risks, benefits, limitations, alternatives and imponderables have been reviewed with the patient. Questions have been answered. All parties are agreeable.

## 2015-06-13 NOTE — Discharge Instructions (Signed)
°Colonoscopy °Discharge Instructions ° °Read the instructions outlined below and refer to this sheet in the next few weeks. These discharge instructions provide you with general information on caring for yourself after you leave the hospital. Your doctor may also give you specific instructions. While your treatment has been planned according to the most current medical practices available, unavoidable complications occasionally occur. If you have any problems or questions after discharge, call Dr. Rourk at [phone]. °ACTIVITY °· You may resume your regular activity, but move at a slower pace for the next 24 hours.  °· Take frequent rest periods for the next 24 hours.  °· Walking will help get rid of the air and reduce the bloated feeling in your belly (abdomen).  °· No driving for 24 hours (because of the medicine (anesthesia) used during the test).   °· Do not sign any important legal documents or operate any machinery for 24 hours (because of the anesthesia used during the test).  °NUTRITION °· Drink plenty of fluids.  °· You may resume your normal diet as instructed by your doctor.  °· Begin with a light meal and progress to your normal diet. Heavy or fried foods are harder to digest and may make you feel sick to your stomach (nauseated).  °· Avoid alcoholic beverages for 24 hours or as instructed.  °MEDICATIONS °· You may resume your normal medications unless your doctor tells you otherwise.  °WHAT YOU CAN EXPECT TODAY °· Some feelings of bloating in the abdomen.  °· Passage of more gas than usual.  °· Spotting of blood in your stool or on the toilet paper.  °IF YOU HAD POLYPS REMOVED DURING THE COLONOSCOPY: °· No aspirin products for 7 days or as instructed.  °· No alcohol for 7 days or as instructed.  °· Eat a soft diet for the next 24 hours.  °FINDING OUT THE RESULTS OF YOUR TEST °Not all test results are available during your visit. If your test results are not back during the visit, make an appointment  with your caregiver to find out the results. Do not assume everything is normal if you have not heard from your caregiver or the medical facility. It is important for you to follow up on all of your test results.  °SEEK IMMEDIATE MEDICAL ATTENTION IF: °· You have more than a spotting of blood in your stool.  °· Your belly is swollen (abdominal distention).  °· You are nauseated or vomiting.  °· You have a temperature over 101.  °· You have abdominal pain or discomfort that is severe or gets worse throughout the day.  ° ° °Colon polyp and diverticulosis information provided ° °Further recommendations to follow pending review of pathology report ° ° °Diverticulosis °Diverticulosis is the condition that develops when small pouches (diverticula) form in the wall of your colon. Your colon, or large intestine, is where water is absorbed and stool is formed. The pouches form when the inside layer of your colon pushes through weak spots in the outer layers of your colon. °CAUSES  °No one knows exactly what causes diverticulosis. °RISK FACTORS °· Being older than 50. Your risk for this condition increases with age. Diverticulosis is rare in people younger than 40 years. By age 80, almost everyone has it. °· Eating a low-fiber diet. °· Being frequently constipated. °· Being overweight. °· Not getting enough exercise. °· Smoking. °· Taking over-the-counter pain medicines, like aspirin and ibuprofen. °SYMPTOMS  °Most people with diverticulosis do not have symptoms. °DIAGNOSIS  °  Because diverticulosis often has no symptoms, health care providers often discover the condition during an exam for other colon problems. In many cases, a health care provider will diagnose diverticulosis while using a flexible scope to examine the colon (colonoscopy). TREATMENT  If you have never developed an infection related to diverticulosis, you may not need treatment. If you have had an infection before, treatment may include:  Eating more  fruits, vegetables, and grains.  Taking a fiber supplement.  Taking a live bacteria supplement (probiotic).  Taking medicine to relax your colon. HOME CARE INSTRUCTIONS   Drink at least 6-8 glasses of water each day to prevent constipation.  Try not to strain when you have a bowel movement.  Keep all follow-up appointments. If you have had an infection before:  Increase the fiber in your diet as directed by your health care provider or dietitian.  Take a dietary fiber supplement if your health care provider approves.  Only take medicines as directed by your health care provider. SEEK MEDICAL CARE IF:   You have abdominal pain.  You have bloating.  You have cramps.  You have not gone to the bathroom in 3 days. SEEK IMMEDIATE MEDICAL CARE IF:   Your pain gets worse.  Yourbloating becomes very bad.  You have a fever or chills, and your symptoms suddenly get worse.  You begin vomiting.  You have bowel movements that are bloody or black. MAKE SURE YOU:  Understand these instructions.  Will watch your condition.  Will get help right away if you are not doing well or get worse.   This information is not intended to replace advice given to you by your health care provider. Make sure you discuss any questions you have with your health care provider.   Document Released: 12/08/2003 Document Revised: 03/17/2013 Document Reviewed: 02/04/2013 Elsevier Interactive Patient Education 2016 Elsevier Inc[employer] Colon Polyps Polyps are lumps of extra tissue growing inside the body. Polyps can grow in the large intestine (colon). Most colon polyps are noncancerous (benign). However, some colon polyps can become cancerous over time. Polyps that are larger than a pea may be harmful. To be safe, caregivers remove and test all polyps. CAUSES  Polyps form when mutations in the genes cause your cells to grow and divide even though no more tissue is needed. RISK FACTORS There are a number  of risk factors that can increase your chances of getting colon polyps. They include:  Being older than 50 years.  Family history of colon polyps or colon cancer.  Long-term colon diseases, such as colitis or Crohn disease.  Being overweight.  Smoking.  Being inactive.  Drinking too much alcohol. SYMPTOMS  Most small polyps do not cause symptoms. If symptoms are present, they may include:  Blood in the stool. The stool may look dark red or black.  Constipation or diarrhea that lasts longer than 1 week. DIAGNOSIS People often do not know they have polyps until their caregiver finds them during a regular checkup. Your caregiver can use 4 tests to check for polyps:  Digital rectal exam. The caregiver wears gloves and feels inside the rectum. This test would find polyps only in the rectum.  Barium enema. The caregiver puts a liquid called barium into your rectum before taking X-rays of your colon. Barium makes your colon look white. Polyps are dark, so they are easy to see in the X-ray pictures.  Sigmoidoscopy. A thin, flexible tube (sigmoidoscope) is placed into your rectum. The sigmoidoscope has a  light and tiny camera in it. The caregiver uses the sigmoidoscope to look at the last third of your colon.  Colonoscopy. This test is like sigmoidoscopy, but the caregiver looks at the entire colon. This is the most common method for finding and removing polyps. TREATMENT  Any polyps will be removed during a sigmoidoscopy or colonoscopy. The polyps are then tested for cancer. PREVENTION  To help lower your risk of getting more colon polyps:  Eat plenty of fruits and vegetables. Avoid eating fatty foods.  Do not smoke.  Avoid drinking alcohol.  Exercise every day.  Lose weight if recommended by your caregiver.  Eat plenty of calcium and folate. Foods that are rich in calcium include milk, cheese, and broccoli. Foods that are rich in folate include chickpeas, kidney beans, and  spinach. HOME CARE INSTRUCTIONS Keep all follow-up appointments as directed by your caregiver. You may need periodic exams to check for polyps. SEEK MEDICAL CARE IF: You notice bleeding during a bowel movement.   This information is not intended to replace advice given to you by your health care provider. Make sure you discuss any questions you have with your health care provider.   Document Released: 12/07/2003 Document Revised: 04/02/2014 Document Reviewed: 05/22/2011 Elsevier Interactive Patient Education [employer].

## 2015-06-15 NOTE — Op Note (Signed)
[facility] Patient Name: Desiree Barber Procedure Date: 06/13/2015 11:47 AM MRN: [id number] Date of Birth: Oct 08, 1950 Attending MD: Norvel Richards , MD CSN: [id number] Age: 65 Admit Type: Outpatient Procedure:                Colonoscopy Indications:              Screening for colorectal malignant neoplasm Providers:                Norvel Richards, MD, Janeece Riggers, RN, Georgeann Oppenheim, Technician Referring MD:             Dr. Hilma Favors, MD (Referring MD) Medicines:                Monitored Anesthesia Care Complications:            No immediate complications. Estimated Blood Loss:     Estimated blood loss was minimal. Procedure:                Pre-Anesthesia Assessment:                           - Prior to the procedure, a History and Physical                            was performed, and patient medications and                            allergies were reviewed. The patient's tolerance of                            previous anesthesia was also reviewed. The risks                            and benefits of the procedure and the sedation                            options and risks were discussed with the patient.                            All questions were answered, and informed consent                            was obtained. Prior Anticoagulants: The patient has                            taken no previous anticoagulant or antiplatelet                            agents. ASA Grade Assessment: III - A patient with                            severe systemic disease. After reviewing the risks  and benefits, the patient was deemed in                            satisfactory condition to undergo the procedure.                           After obtaining informed consent, the colonoscope                            was passed under direct vision. Throughout the                            procedure, the patient's blood pressure,  pulse, and                            oxygen saturations were monitored continuously. The                            [id number]) scope was introduced through                            the anus and advanced to the the cecum, identified                            by appendiceal orifice and ileocecal valve. The                            colonoscopy was performed without difficulty. The                            patient tolerated the procedure well. The quality                            of the bowel preparation was adequate. The                            ileocecal valve, appendiceal orifice, and rectum                            were photographed. Scope In: 11:54:46 AM Scope Out: 12:16:32 PM Scope Withdrawal Time: 0 hours 6 minutes 39 seconds  Total Procedure Duration: 0 hours 21 minutes 46 seconds  Findings:      A 6 mm polyp was found in the sigmoid colon. The polyp was       semi-pedunculated. The polyp was removed with a cold snare. Resection       and retrieval were complete. Estimated blood loss was minimal.      Multiple medium-mouthed diverticula were found in the entire colon.       There was no evidence of diverticular bleeding.      The retroflexed view of the distal rectum and anal verge was normal and       showed no anal or rectal abnormalities. Impression:               - One 6 mm polyp in the sigmoid colon, removed  with                            a cold snare. Resected and retrieved.                           - Diverticulosis in the entire examined colon.                            There was no evidence of diverticular bleeding.                           - The distal rectum and anal verge are normal on                            retroflexion view. Moderate Sedation:      Moderate (conscious) sedation was personally administered by an       anesthesia professional. The following parameters were monitored: oxygen       saturation, heart rate, blood pressure,  respiratory rate, EKG, adequacy       of pulmonary ventilation, and response to care. Total physician       intraservice time was 22 minutes. Recommendation:           - Patient has a contact number available for                            emergencies. The signs and symptoms of potential                            delayed complications were discussed with the                            patient. Return to normal activities tomorrow.                            Written discharge instructions were provided to the                            patient.                           - Resume previous diet. Repeat colonoscopy pending                            review of pathology report                           - Continue present medications.                           - Repeat colonoscopy is recommended for                            surveillance. The colonoscopy date will be  determined after pathology results from today's                            exam become available for review. Procedure Code(s):        --- Professional ---                           [phone], Colonoscopy, flexible; with removal of                            tumor(s), polyp(s), or other lesion(s) by snare                            technique Diagnosis Code(s):        --- Professional ---                           Z12.11, Encounter for screening for malignant                            neoplasm of colon                           D12.5, Benign neoplasm of sigmoid colon                           K57.30, Diverticulosis of large intestine without                            perforation or abscess without bleeding CPT copyright 2016 American Medical Association. All rights reserved. The codes documented in this report are preliminary and upon coder review may  be revised to meet current compliance requirements. Cristopher Estimable. Jalayia Bagheri, MD Norvel Richards, MD 06/15/2015 9:07:10 AM This report has been signed  electronically. Number of Addenda: 0

## 2015-06-16 ENCOUNTER — Encounter: Payer: Self-pay | Admitting: Internal Medicine

## 2015-06-17 ENCOUNTER — Encounter (HOSPITAL_COMMUNITY): Payer: Self-pay | Admitting: Internal Medicine

## 2015-12-20 DIAGNOSIS — J449 Chronic obstructive pulmonary disease, unspecified: Secondary | ICD-10-CM | POA: Diagnosis not present

## 2015-12-20 DIAGNOSIS — F419 Anxiety disorder, unspecified: Secondary | ICD-10-CM | POA: Diagnosis not present

## 2015-12-20 DIAGNOSIS — J4520 Mild intermittent asthma, uncomplicated: Secondary | ICD-10-CM | POA: Diagnosis not present

## 2015-12-20 DIAGNOSIS — Z681 Body mass index (BMI) 19 or less, adult: Secondary | ICD-10-CM | POA: Diagnosis not present

## 2015-12-20 DIAGNOSIS — K219 Gastro-esophageal reflux disease without esophagitis: Secondary | ICD-10-CM | POA: Diagnosis not present

## 2015-12-30 DIAGNOSIS — R102 Pelvic and perineal pain: Secondary | ICD-10-CM | POA: Diagnosis not present

## 2015-12-30 DIAGNOSIS — R300 Dysuria: Secondary | ICD-10-CM | POA: Diagnosis not present

## 2016-02-24 DIAGNOSIS — N390 Urinary tract infection, site not specified: Secondary | ICD-10-CM | POA: Diagnosis not present

## 2016-04-13 ENCOUNTER — Ambulatory Visit: Payer: BLUE CROSS/BLUE SHIELD | Admitting: Urology

## 2016-08-24 DIAGNOSIS — J449 Chronic obstructive pulmonary disease, unspecified: Secondary | ICD-10-CM | POA: Diagnosis not present

## 2016-08-24 DIAGNOSIS — I10 Essential (primary) hypertension: Secondary | ICD-10-CM | POA: Diagnosis not present

## 2016-08-24 DIAGNOSIS — Z1389 Encounter for screening for other disorder: Secondary | ICD-10-CM | POA: Diagnosis not present

## 2016-08-24 DIAGNOSIS — Z681 Body mass index (BMI) 19 or less, adult: Secondary | ICD-10-CM | POA: Diagnosis not present

## 2016-08-24 DIAGNOSIS — E782 Mixed hyperlipidemia: Secondary | ICD-10-CM | POA: Diagnosis not present

## 2016-10-22 DIAGNOSIS — M818 Other osteoporosis without current pathological fracture: Secondary | ICD-10-CM | POA: Diagnosis not present

## 2016-10-22 DIAGNOSIS — E2839 Other primary ovarian failure: Secondary | ICD-10-CM | POA: Diagnosis not present

## 2017-01-28 DIAGNOSIS — H2513 Age-related nuclear cataract, bilateral: Secondary | ICD-10-CM | POA: Diagnosis not present

## 2017-01-28 DIAGNOSIS — H02831 Dermatochalasis of right upper eyelid: Secondary | ICD-10-CM | POA: Diagnosis not present

## 2017-01-28 DIAGNOSIS — H02834 Dermatochalasis of left upper eyelid: Secondary | ICD-10-CM | POA: Diagnosis not present

## 2017-01-28 DIAGNOSIS — H04123 Dry eye syndrome of bilateral lacrimal glands: Secondary | ICD-10-CM | POA: Diagnosis not present

## 2017-02-28 DIAGNOSIS — M7072 Other bursitis of hip, left hip: Secondary | ICD-10-CM | POA: Diagnosis not present

## 2017-02-28 DIAGNOSIS — M545 Low back pain: Secondary | ICD-10-CM | POA: Diagnosis not present

## 2017-02-28 DIAGNOSIS — Z681 Body mass index (BMI) 19 or less, adult: Secondary | ICD-10-CM | POA: Diagnosis not present

## 2017-02-28 DIAGNOSIS — J449 Chronic obstructive pulmonary disease, unspecified: Secondary | ICD-10-CM | POA: Diagnosis not present

## 2017-03-01 ENCOUNTER — Ambulatory Visit (HOSPITAL_COMMUNITY)
Admission: RE | Admit: 2017-03-01 | Discharge: 2017-03-01 | Disposition: A | Discharge status: Home / Self Care | Payer: Medicare Other | Source: Ambulatory Visit | Attending: Family Medicine | Admitting: Family Medicine

## 2017-03-01 ENCOUNTER — Other Ambulatory Visit (HOSPITAL_COMMUNITY): Payer: Self-pay | Admitting: Family Medicine

## 2017-03-01 DIAGNOSIS — M5136 Other intervertebral disc degeneration, lumbar region: Secondary | ICD-10-CM | POA: Insufficient documentation

## 2017-03-01 DIAGNOSIS — M7072 Other bursitis of hip, left hip: Secondary | ICD-10-CM

## 2017-03-01 DIAGNOSIS — R52 Pain, unspecified: Secondary | ICD-10-CM

## 2017-03-01 DIAGNOSIS — R102 Pelvic and perineal pain: Secondary | ICD-10-CM | POA: Diagnosis not present

## 2017-03-01 DIAGNOSIS — M545 Low back pain: Secondary | ICD-10-CM | POA: Diagnosis not present

## 2017-03-07 DIAGNOSIS — M47816 Spondylosis without myelopathy or radiculopathy, lumbar region: Secondary | ICD-10-CM | POA: Diagnosis not present

## 2017-03-07 DIAGNOSIS — Z681 Body mass index (BMI) 19 or less, adult: Secondary | ICD-10-CM | POA: Diagnosis not present

## 2017-07-09 DIAGNOSIS — I10 Essential (primary) hypertension: Secondary | ICD-10-CM | POA: Diagnosis not present

## 2017-07-09 DIAGNOSIS — Z1389 Encounter for screening for other disorder: Secondary | ICD-10-CM | POA: Diagnosis not present

## 2017-07-09 DIAGNOSIS — Z0001 Encounter for general adult medical examination with abnormal findings: Secondary | ICD-10-CM | POA: Diagnosis not present

## 2017-07-09 DIAGNOSIS — Z79899 Other long term (current) drug therapy: Secondary | ICD-10-CM | POA: Diagnosis not present

## 2017-07-09 DIAGNOSIS — E785 Hyperlipidemia, unspecified: Secondary | ICD-10-CM | POA: Diagnosis not present

## 2017-07-09 DIAGNOSIS — M7070 Other bursitis of hip, unspecified hip: Secondary | ICD-10-CM | POA: Diagnosis not present

## 2017-07-09 DIAGNOSIS — Z681 Body mass index (BMI) 19 or less, adult: Secondary | ICD-10-CM | POA: Diagnosis not present

## 2017-07-09 DIAGNOSIS — Z0000 Encounter for general adult medical examination without abnormal findings: Secondary | ICD-10-CM | POA: Diagnosis not present

## 2017-07-09 DIAGNOSIS — R944 Abnormal results of kidney function studies: Secondary | ICD-10-CM | POA: Diagnosis not present

## 2017-07-22 DIAGNOSIS — Z1231 Encounter for screening mammogram for malignant neoplasm of breast: Secondary | ICD-10-CM | POA: Diagnosis not present

## 2017-08-21 DIAGNOSIS — D72829 Elevated white blood cell count, unspecified: Secondary | ICD-10-CM | POA: Diagnosis not present

## 2017-10-22 DIAGNOSIS — Z1389 Encounter for screening for other disorder: Secondary | ICD-10-CM | POA: Diagnosis not present

## 2017-10-22 DIAGNOSIS — M5030 Other cervical disc degeneration, unspecified cervical region: Secondary | ICD-10-CM | POA: Diagnosis not present

## 2017-10-22 DIAGNOSIS — Z681 Body mass index (BMI) 19 or less, adult: Secondary | ICD-10-CM | POA: Diagnosis not present

## 2017-10-22 DIAGNOSIS — F419 Anxiety disorder, unspecified: Secondary | ICD-10-CM | POA: Diagnosis not present

## 2017-11-13 DIAGNOSIS — Z681 Body mass index (BMI) 19 or less, adult: Secondary | ICD-10-CM | POA: Diagnosis not present

## 2017-11-13 DIAGNOSIS — I10 Essential (primary) hypertension: Secondary | ICD-10-CM | POA: Diagnosis not present

## 2017-11-13 DIAGNOSIS — G545 Neuralgic amyotrophy: Secondary | ICD-10-CM | POA: Diagnosis not present

## 2017-12-30 DIAGNOSIS — H2513 Age-related nuclear cataract, bilateral: Secondary | ICD-10-CM | POA: Diagnosis not present

## 2017-12-30 DIAGNOSIS — H04123 Dry eye syndrome of bilateral lacrimal glands: Secondary | ICD-10-CM | POA: Diagnosis not present

## 2017-12-30 DIAGNOSIS — H02834 Dermatochalasis of left upper eyelid: Secondary | ICD-10-CM | POA: Diagnosis not present

## 2017-12-30 DIAGNOSIS — H02831 Dermatochalasis of right upper eyelid: Secondary | ICD-10-CM | POA: Diagnosis not present

## 2018-02-19 DIAGNOSIS — Z1389 Encounter for screening for other disorder: Secondary | ICD-10-CM | POA: Diagnosis not present

## 2018-02-19 DIAGNOSIS — Z681 Body mass index (BMI) 19 or less, adult: Secondary | ICD-10-CM | POA: Diagnosis not present

## 2018-02-19 DIAGNOSIS — M5416 Radiculopathy, lumbar region: Secondary | ICD-10-CM | POA: Diagnosis not present

## 2018-02-19 DIAGNOSIS — M5136 Other intervertebral disc degeneration, lumbar region: Secondary | ICD-10-CM | POA: Diagnosis not present

## 2018-03-27 DIAGNOSIS — M5136 Other intervertebral disc degeneration, lumbar region: Secondary | ICD-10-CM | POA: Diagnosis not present

## 2018-03-27 DIAGNOSIS — M5410 Radiculopathy, site unspecified: Secondary | ICD-10-CM | POA: Diagnosis not present

## 2018-03-27 DIAGNOSIS — M4807 Spinal stenosis, lumbosacral region: Secondary | ICD-10-CM | POA: Diagnosis not present

## 2018-04-08 DIAGNOSIS — M48061 Spinal stenosis, lumbar region without neurogenic claudication: Secondary | ICD-10-CM | POA: Insufficient documentation

## 2018-04-08 DIAGNOSIS — I10 Essential (primary) hypertension: Secondary | ICD-10-CM | POA: Diagnosis not present

## 2018-04-08 DIAGNOSIS — M48062 Spinal stenosis, lumbar region with neurogenic claudication: Secondary | ICD-10-CM | POA: Diagnosis not present

## 2018-04-08 DIAGNOSIS — Z681 Body mass index (BMI) 19 or less, adult: Secondary | ICD-10-CM | POA: Diagnosis not present

## 2018-04-09 DIAGNOSIS — H6120 Impacted cerumen, unspecified ear: Secondary | ICD-10-CM | POA: Diagnosis not present

## 2018-04-09 DIAGNOSIS — Z1389 Encounter for screening for other disorder: Secondary | ICD-10-CM | POA: Diagnosis not present

## 2018-04-09 DIAGNOSIS — Z681 Body mass index (BMI) 19 or less, adult: Secondary | ICD-10-CM | POA: Diagnosis not present

## 2018-04-09 DIAGNOSIS — R630 Anorexia: Secondary | ICD-10-CM | POA: Diagnosis not present

## 2018-04-09 DIAGNOSIS — J019 Acute sinusitis, unspecified: Secondary | ICD-10-CM | POA: Diagnosis not present

## 2018-04-15 ENCOUNTER — Encounter: Payer: Self-pay | Admitting: Internal Medicine

## 2018-04-16 DIAGNOSIS — Z681 Body mass index (BMI) 19 or less, adult: Secondary | ICD-10-CM | POA: Diagnosis not present

## 2018-04-16 DIAGNOSIS — R197 Diarrhea, unspecified: Secondary | ICD-10-CM | POA: Diagnosis not present

## 2018-04-16 DIAGNOSIS — R51 Headache: Secondary | ICD-10-CM | POA: Diagnosis not present

## 2018-04-21 ENCOUNTER — Other Ambulatory Visit (HOSPITAL_COMMUNITY): Payer: Self-pay | Admitting: Physician Assistant

## 2018-04-21 ENCOUNTER — Ambulatory Visit (HOSPITAL_COMMUNITY)
Admission: RE | Admit: 2018-04-21 | Discharge: 2018-04-21 | Disposition: A | Discharge status: Home / Self Care | Payer: Medicare Other | Source: Ambulatory Visit | Attending: Physician Assistant | Admitting: Physician Assistant

## 2018-04-21 DIAGNOSIS — R05 Cough: Secondary | ICD-10-CM | POA: Insufficient documentation

## 2018-04-21 DIAGNOSIS — N390 Urinary tract infection, site not specified: Secondary | ICD-10-CM | POA: Diagnosis not present

## 2018-04-21 DIAGNOSIS — Z1389 Encounter for screening for other disorder: Secondary | ICD-10-CM | POA: Diagnosis not present

## 2018-04-21 DIAGNOSIS — R39198 Other difficulties with micturition: Secondary | ICD-10-CM | POA: Diagnosis not present

## 2018-04-21 DIAGNOSIS — R51 Headache: Secondary | ICD-10-CM | POA: Diagnosis not present

## 2018-04-21 DIAGNOSIS — R059 Cough, unspecified: Secondary | ICD-10-CM

## 2018-04-21 DIAGNOSIS — R634 Abnormal weight loss: Secondary | ICD-10-CM | POA: Diagnosis not present

## 2018-04-21 DIAGNOSIS — Z681 Body mass index (BMI) 19 or less, adult: Secondary | ICD-10-CM | POA: Diagnosis not present

## 2018-05-13 ENCOUNTER — Ambulatory Visit (INDEPENDENT_AMBULATORY_CARE_PROVIDER_SITE_OTHER): Payer: Medicare Other | Admitting: Internal Medicine

## 2018-05-13 ENCOUNTER — Encounter: Payer: Self-pay | Admitting: Internal Medicine

## 2018-05-13 ENCOUNTER — Other Ambulatory Visit: Payer: Self-pay

## 2018-05-13 VITALS — BP 145/80 | HR 92 | Temp 97.9°F | Ht 64.5 in | Wt 88.6 lb

## 2018-05-13 DIAGNOSIS — R6881 Early satiety: Secondary | ICD-10-CM

## 2018-05-13 DIAGNOSIS — R634 Abnormal weight loss: Secondary | ICD-10-CM | POA: Diagnosis not present

## 2018-05-13 DIAGNOSIS — K8070 Calculus of gallbladder and bile duct without cholecystitis without obstruction: Secondary | ICD-10-CM | POA: Diagnosis not present

## 2018-05-13 MED ORDER — PANTOPRAZOLE SODIUM 40 MG PO TBEC: 40.0000 mg | DELAYED_RELEASE_TABLET | Freq: Every day | ORAL | 3 refills | Status: DC

## 2018-05-13 NOTE — Progress Notes (Signed)
Primary Care Physician:  Ginger Organ Primary Gastroenterologist:  Dr.   Pre-Procedure History & Physical: HPI:  Desiree Barber is a 68 y.o. female here for further evaluation of weight loss/early satiety.  Noted to have a single large gallstone in her gallbladder on MRI done for her spine recently.  Scheduled for spine surgery the end of next week.  Patient states she gets hungry but after couple of mouthful she is full.  She denies abdominal pain; she denies frequent reflux symptoms;  she does take OTC Nexium on a "as needed basis".  Denies dysphagia.  She has lost about 12 pounds since she was weighed in here about 2 years ago.   She readily admits to taking 3 Goody powders daily for various aches and pains previously on Advil.  Patient denies any recent melena or rectal bleeding in fact, denies any change in bowel habits.  Reports extensive dental work.  Multiple crowns.  She has a difficult difficult time chewing meat and other foods.    Past Medical History:  Diagnosis Date  . Anxiety    Occasional  . COPD (chronic obstructive pulmonary disease) ([place])     Past Surgical History:  Procedure Laterality Date  . COLONOSCOPY  02/23/04   RMR: normal rectum and colon.minimal internal hemorrhoids  . COLONOSCOPY WITH PROPOFOL N/A 06/13/2015   Procedure: COLONOSCOPY WITH PROPOFOL;  Surgeon: Daneil Dolin, MD;  Location: [facility];  Service: Endoscopy;  Laterality: N/A;  1045  . POLYPECTOMY  06/13/2015   Procedure: POLYPECTOMY;  Surgeon: Daneil Dolin, MD;  Location: [facility];  Service: Endoscopy;;  sigmoid colon polyp  . TOOTH EXTRACTION    . TRIGGER FINGER RELEASE Bilateral   . TUBAL LIGATION      Prior to Admission medications   Medication Sig Start Date End Date Taking? Authorizing Provider  ALPRAZolam Duanne Moron) 0.25 MG tablet Take 0.25 mg by mouth at bedtime as needed for anxiety.   Yes [provider]  lisinopril (PRINIVIL,ZESTRIL) 5 MG tablet  Take 0.5 tablets by mouth daily. 04/24/18  Yes [provider]  tiotropium (SPIRIVA) 18 MCG inhalation capsule Place 18 mcg into inhaler and inhale 3 (three) times a week.    Yes [provider]  aspirin 81 MG tablet Take 81 mg by mouth daily.    [provider]  pantoprazole (PROTONIX) 40 MG tablet Take 1 tablet (40 mg total) by mouth daily. 05/13/18   Theia Dezeeuw, Cristopher Estimable, MD  polyethylene glycol-electrolytes (NULYTELY/GOLYTELY) 420 g solution Take 4,000 mLs by mouth once. Patient not taking: Reported on 05/13/2018 05/18/15   Carlis Stable, NP  vitamin B-12 (CYANOCOBALAMIN) 100 MCG tablet Take 100 mcg by mouth daily.    [provider]    Allergies as of 05/13/2018 - Review Complete 05/13/2018  Allergen Reaction Noted  . Demerol [meperidine]  05/03/2015  . Oxycontin [oxycodone hcl]  05/03/2015  . Fish allergy Nausea Only 06/13/2015    Family History  Problem Relation Age of Onset  . Colon cancer Neg Hx     Social History   Socioeconomic History  . Marital status: Married    Spouse name: Not on file  . Number of children: Not on file  . Years of education: Not on file  . Highest education level: Not on file  Occupational History  . Not on file  Social Needs  . Financial resource strain: Not on file  . Food insecurity:    Worry:  Not on file    Inability: Not on file  . Transportation needs:    Medical: Not on file    Non-medical: Not on file  Tobacco Use  . Smoking status: Current Every Day Smoker    Packs/day: 0.50    Years: 30.00    Pack years: 15.00    Types: Cigarettes  . Smokeless tobacco: Never Used  Substance and Sexual Activity  . Alcohol use: Yes    Alcohol/week: 0.0 standard drinks    Comment: 3 beers a day  . Drug use: No  . Sexual activity: Yes    Birth control/protection: Surgical  Lifestyle  . Physical activity:    Days per week: Not on file    Minutes per session: Not on file  . Stress: Not on file  Relationships  .  Social connections:    Talks on phone: Not on file    Gets together: Not on file    Attends religious service: Not on file    Active member of club or organization: Not on file    Attends meetings of clubs or organizations: Not on file    Relationship status: Not on file  . Intimate partner violence:    Fear of current or ex partner: Not on file    Emotionally abused: Not on file    Physically abused: Not on file    Forced sexual activity: Not on file  Other Topics Concern  . Not on file  Social History Narrative  . Not on file    Review of Systems: See HPI, otherwise negative ROS  Physical Exam: BP (!) 145/80   Pulse 92   Temp 97.9 F (36.6 C) (Oral)   Ht 5' 4.5" (1.638 m)   Wt 88 lb 9.6 oz (40.2 kg)   BMI 14.97 kg/m  General:   Alert,   pleasant and cooperative in NAD Skin:  Intact without significant lesions or rashes. Neck:  Supple; no masses or thyromegaly. No significant cervical adenopathy. Lungs:  Clear throughout to auscultation.   No wheezes, crackles, or rhonchi. No acute distress. Heart:  Regular rate and rhythm; no murmurs, clicks, rubs,  or gallops. Abdomen: Non-distended, normal bowel sounds.  Soft and nontender without appreciable mass or hepatosplenomegaly.  Pulses:  Normal pulses noted. Extremities:  Without clubbing or edema.  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  Noted solitary gallstone on MRI done for other reasons.  She is devoid of any sort of abdominal pain. Patient with occult gallstones.  Clinically, I do not feel this is causing her any problems at this time. Her weight loss and early satiety may be multifactorial in etiology.  She has difficulty processing her meals due to relatively poor dentition.  I do not feel a GI evaluation is needed prior to her spine surgery, however. Evaluation of her upper GI tract may be warranted in the near future if she does not improve with my  recommendations as outlined below:    Recommendations:  Stop goody powders/ Aleve/ Advil entirely.  Begin Protonix 40 mg daily (disp 30 with 3 refills)  We will plan her back in the office in 6 weeks.  If  patient is not significantly improved when she returns for follow-up visit in 6 weeks, then we will offer a diagnostic EGD  No need for gallbladder evaluation at this time  Office visit in 6 weeks      Notice: This dictation was prepared with  Dragon dictation along with smaller Company secretary. Any transcriptional errors that result from this process are unintentional and may not be corrected upon review.

## 2018-05-13 NOTE — Progress Notes (Signed)
Protonix 40 mg daily #30 w 3 rfs [facility].

## 2018-05-13 NOTE — Patient Instructions (Signed)
Stop goody powders/ Aleve/ Advil  Begin Protonix 40 mg daily (disp 30 with 3 refills)  I recommend an EGD (upper endoscopy - but it can wait until after you have back surgery - if needed)  No need for a gallbladder evaluation at this time  Office visit in 6 weeks

## 2018-05-23 DIAGNOSIS — M48062 Spinal stenosis, lumbar region with neurogenic claudication: Secondary | ICD-10-CM | POA: Diagnosis not present

## 2018-06-25 DIAGNOSIS — F419 Anxiety disorder, unspecified: Secondary | ICD-10-CM | POA: Diagnosis not present

## 2018-06-25 DIAGNOSIS — Z681 Body mass index (BMI) 19 or less, adult: Secondary | ICD-10-CM | POA: Diagnosis not present

## 2018-08-20 DIAGNOSIS — Z0000 Encounter for general adult medical examination without abnormal findings: Secondary | ICD-10-CM | POA: Diagnosis not present

## 2018-08-20 DIAGNOSIS — Z681 Body mass index (BMI) 19 or less, adult: Secondary | ICD-10-CM | POA: Diagnosis not present

## 2018-08-20 DIAGNOSIS — Z1389 Encounter for screening for other disorder: Secondary | ICD-10-CM | POA: Diagnosis not present

## 2018-08-25 DIAGNOSIS — Z681 Body mass index (BMI) 19 or less, adult: Secondary | ICD-10-CM | POA: Diagnosis not present

## 2018-08-25 DIAGNOSIS — M791 Myalgia, unspecified site: Secondary | ICD-10-CM | POA: Diagnosis not present

## 2018-09-02 DIAGNOSIS — M25511 Pain in right shoulder: Secondary | ICD-10-CM | POA: Diagnosis not present

## 2018-09-02 DIAGNOSIS — M5412 Radiculopathy, cervical region: Secondary | ICD-10-CM | POA: Diagnosis not present

## 2018-09-16 DIAGNOSIS — Z1231 Encounter for screening mammogram for malignant neoplasm of breast: Secondary | ICD-10-CM | POA: Diagnosis not present

## 2018-09-17 ENCOUNTER — Encounter (HOSPITAL_COMMUNITY): Payer: Self-pay | Admitting: Occupational Therapy

## 2018-09-17 ENCOUNTER — Other Ambulatory Visit: Payer: Self-pay

## 2018-09-17 ENCOUNTER — Ambulatory Visit (HOSPITAL_COMMUNITY): Payer: Medicare Other | Attending: Orthopedic Surgery | Admitting: Occupational Therapy

## 2018-09-17 DIAGNOSIS — M25511 Pain in right shoulder: Secondary | ICD-10-CM | POA: Diagnosis not present

## 2018-09-17 DIAGNOSIS — R29898 Other symptoms and signs involving the musculoskeletal system: Secondary | ICD-10-CM | POA: Diagnosis not present

## 2018-09-17 DIAGNOSIS — G8929 Other chronic pain: Secondary | ICD-10-CM

## 2018-09-17 NOTE — Patient Instructions (Signed)
Repeat all exercises 10-15 times, 1-2 times per day.  1) Shoulder Protraction    Begin with elbows by your side, slowly "punch" straight out in front of you.      2) Shoulder Flexion  Standing:         Begin with arms at your side with thumbs pointed up, slowly raise both arms up and forward towards overhead.               3) Horizontal abduction/adduction  Standing:           Begin with arms straight out in front of you, bring out to the side in at "T" shape. Keep arms straight entire time.                 4) Internal & External Rotation    *No band* -Stand with elbows at the side and elbows bent 90 degrees. Move your forearms away from your body, then bring back inward toward the body.     5) Shoulder Abduction  Standing:       Lying on your back begin with your arms flat on the table next to your side. Slowly move your arms out to the side so that they go overhead, in a jumping jack or snow angel movement.      6) (Home) Extension: Isometric / Bilateral Arm Retraction - Sitting   Facing anchor, hold hands and elbow at shoulder height, with elbow bent.  Pull arms back to squeeze shoulder blades together. Repeat 10-15 times. 1-3 times/day.   7) (Clinic) Extension / Flexion (Assist)   Face anchor, pull arms back, keeping elbow straight, and squeze shoulder blades together. Repeat 10-15 times. 1-3 times/day.   Copyright  VHI. All rights reserved.   8) (Home) Retraction: Row - Bilateral (Anchor)   Facing anchor, arms reaching forward, pull hands toward stomach, keeping elbows bent and at your sides and pinching shoulder blades together. Repeat 10-15 times. 1-3 times/day.   Copyright  VHI. All rights reserved.

## 2018-09-17 NOTE — Therapy (Signed)
[facility] [address] [place], [place], [address] Phone: [phone]   Fax:  [phone]  Occupational Therapy Evaluation  Patient Details  Name: Desiree Barber MRN: [id number] Date of Birth: [birth year] Referring Provider (OT): Dr. Victorino December   Encounter Date: 09/17/2018  OT End of Session - 09/17/18 1058    Visit Number  1    Number of Visits  4    Date for OT Re-Evaluation  10/17/18    Authorization Type  1) Medicare A & B 2) Generic [employer]    OT Start Time  1010    OT Stop Time  1050    OT Time Calculation (min)  40 min    Activity Tolerance  Patient tolerated treatment well    Behavior During Therapy  [facility] for tasks assessed/performed       Past Medical History:  Diagnosis Date  . Anxiety    Occasional  . COPD (chronic obstructive pulmonary disease) ([place])     Past Surgical History:  Procedure Laterality Date  . COLONOSCOPY  02/23/04   RMR: normal rectum and colon.minimal internal hemorrhoids  . COLONOSCOPY WITH PROPOFOL N/A 06/13/2015   Procedure: COLONOSCOPY WITH PROPOFOL;  Surgeon: Daneil Dolin, MD;  [facility];  Service: Endoscopy;  Laterality: N/A;  1045  . POLYPECTOMY  06/13/2015   Procedure: POLYPECTOMY;  Surgeon: Daneil Dolin, MD;  [facility];  Service: Endoscopy;;  sigmoid colon polyp  . TOOTH EXTRACTION    . TRIGGER FINGER RELEASE Bilateral   . TUBAL LIGATION      There were no vitals filed for this visit.  Subjective Assessment - 09/17/18 1052    Subjective   S: It feels like sciatica in my arm.    Pertinent History  Pt is a 68 y/o female presenting with right shoulder pain beginning approximately 1 year ago. Pt also reports burning and itching sensation in her dorsal forearm. Per chart review MD suspecting cervical radiculopathy. Pt was referred to occupational therapy for evaluation and treatment.    Special Tests  FOTO: 64/100    Patient Stated Goals  To have less pain in  my arm.    Currently in Pain?  Yes    Pain Score  5     Pain Location  Shoulder    Pain Orientation  Right    Pain Descriptors / Indicators  Aching;Burning    Pain Type  Chronic pain    Pain Radiating Towards  forearm    Pain Onset  More than a month ago    Pain Frequency  Constant    Aggravating Factors   certain movements, lifitng items    Pain Relieving Factors  nothing    Effect of Pain on Daily Activities  min effect on ADLs, pt pushes through the pain        [facility] OT Assessment - 09/17/18 1007      Assessment   Medical Diagnosis  right shoulder pain    Referring Provider (OT)  Dr. Victorino December    Onset Date/Surgical Date  09/16/17   approximate date-1 year ago   Hand Dominance  Right    Next MD Visit  not yet scheduled    Prior Therapy  None      Precautions   Precautions  None      Restrictions   Weight Bearing Restrictions  No      Balance Screen   Has the patient fallen in  the past 6 months  No    Has the patient had a decrease in activity level because of a fear of falling?   No    Is the patient reluctant to leave their home because of a fear of falling?   No      Prior Function   Level of Independence  Independent    Vocation  Retired    Leisure  Pt enjoys gardening, 4-wheeling in [place], Radio producer on computer, walking      ADL   ADL comments  Pt is having difficulty with pressing down on surfaces for activities such as scrubbing. Also has difficulty with lifting weighted items such as pots and pans. Also has difficulty operating mouse on computer. Experiences discomfort when driving.       Written Expression   Dominant Hand  Right      Cognition   Overall Cognitive Status  Within Functional Limits for tasks assessed      Observation/Other Assessments   Focus on Therapeutic Outcomes (FOTO)   64/100      ROM / Strength   AROM / PROM / Strength  AROM;PROM;Strength      Palpation   Palpation comment  Pt with moderate muscle knot in trapezius and  medial border of scapula      AROM   Overall AROM Comments  Assessed seated, er/IR adducted. Cervical ROM is WNL    AROM Assessment Site  Shoulder    Right/Left Shoulder  Right    Right Shoulder Flexion  170 Degrees    Right Shoulder ABduction  180 Degrees    Right Shoulder Internal Rotation  90 Degrees    Right Shoulder External Rotation  90 Degrees      PROM   PROM Assessment Site  --    Right/Left Shoulder  --      Strength   Overall Strength Comments  Assessed seated, er/IR adducted    Strength Assessment Site  Shoulder    Right/Left Shoulder  Right    Right Shoulder Flexion  4+/5    Right Shoulder ABduction  4/5    Right Shoulder Internal Rotation  5/5    Right Shoulder External Rotation  4+/5                      OT Education - 09/17/18 1041    Education Details  A/ROM exercises, red scapular theraband    Person(s) Educated  Patient    Methods  Explanation;Demonstration;Handout    Comprehension  Verbalized understanding;Returned demonstration       OT Short Term Goals - 09/17/18 1102      OT SHORT TERM GOAL #1   Title  Pt will be provided with and educated on HEP to improve ability to complete daily tasks and leisure tasks with minimal pain.    Time  4    Period  Weeks    Status  New    Target Date  10/17/18      OT SHORT TERM GOAL #2   Title  Pt will decrease pain in RUE to 3/10 or less to improve ability to use RUE for housework tasks such as scrubbing.    Time  4    Period  Weeks    Status  New      OT SHORT TERM GOAL #3   Title  Pt will decrease fascial restrictions in RUE to minimal amounts or less to decrease pain level during functional reaching tasks.  Time  4    Period  Weeks    Status  New      OT SHORT TERM GOAL #4   Title  Pt will increase RUE strength to 5/5 to improve ability to lift and carry weighted pots and pans without need for rest break or to quickly sit pans down.    Time  4    Period  Weeks    Status  New                Plan - 09/17/18 1058    Clinical Impression Statement  A: Pt is a 68 y/o female presenting with RUE pain centered around trapezius and medial border of scapula, and sensation changes in dorsal forearm. Pt with ROM and strength WNL, tenderness experienced with palpation at these areas. Pt experiences moderate pain daily with majority of tasks completed.    OT Occupational Profile and History  Problem Focused Assessment - Including review of records relating to presenting problem    Occupational performance deficits (Please refer to evaluation for details):  ADL's;IADL's;Leisure    Body Structure / Function / Physical Skills  UE functional use;Fascial restriction;Pain;Strength    Rehab Potential  Good    Clinical Decision Making  Limited treatment options, no task modification necessary    Comorbidities Affecting Occupational Performance:  None    Modification or Assistance to Complete Evaluation   No modification of tasks or assist necessary to complete eval    OT Frequency  1x / week    OT Duration  4 weeks    OT Treatment/Interventions  Self-care/ADL training;Ultrasound;Patient/family education;Passive range of motion;Cryotherapy;Electrical Stimulation;Therapeutic activities;Manual Therapy;Therapeutic exercise;Moist Heat    Plan  P: Pt will benefit from skilled OT services to decrease pain and fascial restrictions in RUE, and improve functional use of RUE for daily tasks. Treatment plan: Myofascial release, manual techniques, A/ROM, scapular stability and strengthening, general RUE strengthening, modalities prn    Consulted and Agree with Plan of Care  Patient       Patient will benefit from skilled therapeutic intervention in order to improve the following deficits and impairments:   Body Structure / Function / Physical Skills: UE functional use, Fascial restriction, Pain, Strength       Visit Diagnosis: 1. Chronic right shoulder pain   2. Other symptoms and signs  involving the musculoskeletal system       Problem List Patient Active Problem List   Diagnosis Date Noted  . History of colonic polyps   . Diverticulosis of colon without hemorrhage   . Encounter for screening colonoscopy 05/18/2015    Guadelupe Sabin, OTR/L  [phone] 09/17/2018, 11:06 AM  [facility] [address] [place], [place], [address] Phone: [phone]   Fax:  [phone]  Name: Desiree Barber MRN: [id number] Date of Birth: 1951-02-02

## 2018-09-24 DIAGNOSIS — Z681 Body mass index (BMI) 19 or less, adult: Secondary | ICD-10-CM | POA: Diagnosis not present

## 2018-09-24 DIAGNOSIS — N342 Other urethritis: Secondary | ICD-10-CM | POA: Diagnosis not present

## 2018-09-25 ENCOUNTER — Other Ambulatory Visit: Payer: Self-pay

## 2018-09-25 ENCOUNTER — Ambulatory Visit (HOSPITAL_COMMUNITY): Payer: Medicare Other | Attending: Orthopedic Surgery

## 2018-09-25 DIAGNOSIS — M25511 Pain in right shoulder: Secondary | ICD-10-CM | POA: Insufficient documentation

## 2018-09-25 DIAGNOSIS — R29898 Other symptoms and signs involving the musculoskeletal system: Secondary | ICD-10-CM | POA: Insufficient documentation

## 2018-09-25 DIAGNOSIS — G8929 Other chronic pain: Secondary | ICD-10-CM | POA: Insufficient documentation

## 2018-09-25 NOTE — Therapy (Signed)
[facility] [place], [place], [address] Phone: [phone]   Fax:  [phone]  Occupational Therapy Treatment  Patient Details  Name: LATAYVIA MANDUJANO MRN: [id number] Date of Birth: Mar 30, 1950 Referring Provider (OT): Dr. Victorino December   Encounter Date: 09/25/2018  OT End of Session - 09/25/18 1245    Visit Number  2    Number of Visits  4    Date for OT Re-Evaluation  10/17/18    Authorization Type  1) Medicare A & B 2) Generic [employer]    OT Start Time  1127    OT Stop Time  1215    OT Time Calculation (min)  48 min    Activity Tolerance  Patient tolerated treatment well    Behavior During Therapy  [facility] for tasks assessed/performed       Past Medical History:  Diagnosis Date  . Anxiety    Occasional  . COPD (chronic obstructive pulmonary disease) ([place])     Past Surgical History:  Procedure Laterality Date  . COLONOSCOPY  02/23/04   RMR: normal rectum and colon.minimal internal hemorrhoids  . COLONOSCOPY WITH PROPOFOL N/A 06/13/2015   Procedure: COLONOSCOPY WITH PROPOFOL;  Surgeon: Daneil Dolin, MD;  [facility];  Service: Endoscopy;  Laterality: N/A;  1045  . POLYPECTOMY  06/13/2015   Procedure: POLYPECTOMY;  Surgeon: Daneil Dolin, MD;  [facility];  Service: Endoscopy;;  sigmoid colon polyp  . TOOTH EXTRACTION    . TRIGGER FINGER RELEASE Bilateral   . TUBAL LIGATION      There were no vitals filed for this visit.  Subjective Assessment - 09/25/18 1244    Subjective   S: I have a UTI so the medicine is making me feel tired but I can't sleep.    Currently in Pain?  Yes    Pain Score  7     Pain Location  Arm    Pain Orientation  Right    Pain Descriptors / Indicators  Burning    Pain Type  Chronic pain    Pain Radiating Towards  N/A    Pain Onset  More than a month ago    Pain Frequency  Constant    Aggravating Factors   certain movements, lifting items    Pain Relieving Factors   nothing    Effect of Pain on Daily Activities  patient pushes through the pain         [facility] OT Assessment - 09/25/18 1254      Assessment   Medical Diagnosis  right shoulder pain      Precautions   Precautions  None               OT Treatments/Exercises (OP) - 09/25/18 1254      Exercises   Exercises  Shoulder      Shoulder Exercises: Supine   Protraction  PROM;5 reps    Horizontal ABduction  PROM;5 reps    External Rotation  PROM;5 reps   abducted   Internal Rotation  PROM;5 reps   abducted   Flexion  PROM;5 reps    ABduction  PROM;5 reps      Shoulder Exercises: Standing   Protraction  AROM;10 reps    Horizontal ABduction  AROM;10 reps    Flexion  AROM;10 reps    ABduction  AROM;10 reps    Other Standing Exercises  Y arms lift off; 10X; A/ROM  Shoulder Exercises: Therapy Ball   Other Therapy Ball Exercises  Chest press, flexion: green ball 10X      Shoulder Exercises: ROM/Strengthening   UBE (Upper Arm Bike)  level 1 2' reverse only   pace: 5.0-5.5     Manual Therapy   Manual Therapy  Myofascial release    Manual therapy comments  Manual therapy completed prior to exercises.     Myofascial Release  Myofascial release and manual stretching completed to right forearm, upper, arm, trapezius, and scapularis region to decrease fascial restrictions and increase joint mobility in a pain free zone.               OT Short Term Goals - 09/25/18 1242      OT SHORT TERM GOAL #1   Title  Pt will be provided with and educated on HEP to improve ability to complete daily tasks and leisure tasks with minimal pain.    Time  4    Period  Weeks    Status  On-going    Target Date  10/17/18      OT SHORT TERM GOAL #2   Title  Pt will decrease pain in RUE to 3/10 or less to improve ability to use RUE for housework tasks such as scrubbing.    Time  4    Period  Weeks    Status  On-going      OT SHORT TERM GOAL #3   Title  Pt will decrease fascial  restrictions in RUE to minimal amounts or less to decrease pain level during functional reaching tasks.    Time  4    Period  Weeks    Status  On-going      OT SHORT TERM GOAL #4   Title  Pt will increase RUE strength to 5/5 to improve ability to lift and carry weighted pots and pans without need for rest break or to quickly sit pans down.    Time  4    Period  Weeks    Status  On-going               Plan - 09/25/18 1210    Clinical Impression Statement  A: initiated myofascial release, gentle stretching while focusing on scapular stability and strengthening. Due to recent onset of UTI patient was monitored during session and rest breaks were provided as needed. VC for form and technique were provided.    Body Structure / Function / Physical Skills  UE functional use;Fascial restriction;Pain;Strength    Plan  P: Strengthening of periscapular muscles and thoracic extensors. Global neck strengthening.    Consulted and Agree with Plan of Care  Patient       Patient will benefit from skilled therapeutic intervention in order to improve the following deficits and impairments:   Body Structure / Function / Physical Skills: UE functional use, Fascial restriction, Pain, Strength       Visit Diagnosis: 1. Chronic right shoulder pain   2. Other symptoms and signs involving the musculoskeletal system       Problem List Patient Active Problem List   Diagnosis Date Noted  . History of colonic polyps   . Diverticulosis of colon without hemorrhage   . Encounter for screening colonoscopy 05/18/2015   Ailene Ravel, OTR/L,CBIS  [phone]  09/25/2018, 12:58 PM  [facility] [address] [place], [place], [address] Phone: [phone]   Fax:  [phone]  Name: XARA PAULDING MRN: [id number] Date  of Birth: September 26, 1950

## 2018-09-25 NOTE — Patient Instructions (Signed)
Complete 1-2 times a day. Complete 10-12 repetitions.   Wall Lift Off  1) Begin standing about one foot away from the wall and your forearms against the wall with elbows bent.   2) Keep your core engaged and shoulders gently squeezed back in order to maintain good posture while sliding your forearms up the wall. Don't sway your back as you get to the top.   3) At the top with your arms straight, lift the arms off the wall ~ 6 inches.   4) Slide back down the wall to starting position.

## 2018-09-30 ENCOUNTER — Other Ambulatory Visit: Payer: Self-pay

## 2018-09-30 ENCOUNTER — Ambulatory Visit (HOSPITAL_COMMUNITY): Payer: Medicare Other | Admitting: Occupational Therapy

## 2018-09-30 ENCOUNTER — Encounter (HOSPITAL_COMMUNITY): Payer: Self-pay | Admitting: Occupational Therapy

## 2018-09-30 DIAGNOSIS — M25511 Pain in right shoulder: Secondary | ICD-10-CM | POA: Diagnosis not present

## 2018-09-30 DIAGNOSIS — R29898 Other symptoms and signs involving the musculoskeletal system: Secondary | ICD-10-CM | POA: Diagnosis not present

## 2018-09-30 DIAGNOSIS — G8929 Other chronic pain: Secondary | ICD-10-CM | POA: Diagnosis not present

## 2018-09-30 NOTE — Therapy (Signed)
[facility] [address] [place], [place], [address] Phone: [phone]   Fax:  [phone]  Occupational Therapy Treatment  Patient Details  Name: Desiree Barber MRN: [id number] Date of Birth: December 28, 1950 Referring Provider (OT): Dr. Victorino December   Encounter Date: 09/30/2018  OT End of Session - 09/30/18 1403    Visit Number  3    Number of Visits  4    Date for OT Re-Evaluation  10/17/18    Authorization Type  1) Medicare A & B 2) Generic [employer]    OT Start Time  1313    OT Stop Time  1357    OT Time Calculation (min)  44 min    Activity Tolerance  Patient tolerated treatment well    Behavior During Therapy  [facility] for tasks assessed/performed       Past Medical History:  Diagnosis Date  . Anxiety    Occasional  . COPD (chronic obstructive pulmonary disease) ([place])     Past Surgical History:  Procedure Laterality Date  . COLONOSCOPY  02/23/04   RMR: normal rectum and colon.minimal internal hemorrhoids  . COLONOSCOPY WITH PROPOFOL N/A 06/13/2015   Procedure: COLONOSCOPY WITH PROPOFOL;  Surgeon: Daneil Dolin, MD;  [facility];  Service: Endoscopy;  Laterality: N/A;  1045  . POLYPECTOMY  06/13/2015   Procedure: POLYPECTOMY;  Surgeon: Daneil Dolin, MD;  [facility];  Service: Endoscopy;;  sigmoid colon polyp  . TOOTH EXTRACTION    . TRIGGER FINGER RELEASE Bilateral   . TUBAL LIGATION      There were no vitals filed for this visit.  Subjective Assessment - 09/30/18 1309    Subjective   S: My forearm is itching right now.    Currently in Pain?  Yes    Pain Score  4     Pain Location  Arm    Pain Orientation  Right    Pain Descriptors / Indicators  Burning    Pain Type  Chronic pain    Pain Radiating Towards  N/A    Pain Onset  More than a month ago    Pain Frequency  Constant    Aggravating Factors   certain movements,    Pain Relieving Factors  nothing    Effect of Pain on Daily Activities   pushes through the pain    Multiple Pain Sites  No         [facility] OT Assessment - 09/30/18 1309      Assessment   Medical Diagnosis  right shoulder pain      Precautions   Precautions  None               OT Treatments/Exercises (OP) - 09/30/18 1316      Exercises   Exercises  Shoulder;Wrist      Shoulder Exercises: Supine   Protraction  PROM;5 reps    Horizontal ABduction  PROM;5 reps    External Rotation  PROM;5 reps   abducted   Internal Rotation  PROM;5 reps   abducted   Flexion  PROM;5 reps    ABduction  PROM;5 reps      Shoulder Exercises: Prone   Other Prone Exercises  prone [place]: H1 & H2, 10X, A/ROM      Shoulder Exercises: Standing   Extension  Theraband;10 reps    Theraband Level (Shoulder Extension)  Level 2 (Red)    Row  Theraband;10 reps    Theraband Level (  Shoulder Row)  Level 2 (Red)    Retraction  Theraband;10 reps    Theraband Level (Shoulder Retraction)  Level 2 (Red)    Other Standing Exercises  Y arms lift off; 10X; A/ROM      Shoulder Exercises: Therapy Ball   Other Therapy Ball Exercises  Chest press, flexion: green ball 10X      Shoulder Exercises: ROM/Strengthening   X to V Arms  10X    Ball on Wall  1' flexion, 1' abduction    Other ROM/Strengthening Exercises  wall slides with back to door: 10X keeping backs of hands against door and back straight      Wrist Exercises   Wrist Flexion  Strengthening;10 reps    Bar Weights/Barbell (Wrist Flexion)  2 lbs    Wrist Extension  Strengthening;10 reps    Bar Weights/Barbell (Wrist Extension)  2 lbs    Wrist Radial Deviation  Strengthening;10 reps    Bar Weights/Barbell (Radial Deviation)  2 lbs    Other wrist exercises  wrist extensor stretch: 30" holds, 2x      Manual Therapy   Manual Therapy  Myofascial release    Manual therapy comments  Manual therapy completed prior to exercises.     Myofascial Release  Myofascial release and manual stretching completed to right forearm,  upper, arm, trapezius, and scapularis region to decrease fascial restrictions and increase joint mobility in a pain free zone.             OT Education - 09/30/18 1406    Education Details  wrist extensor stretch    Person(s) Educated  Patient    Methods  Explanation;Demonstration;Handout    Comprehension  Verbalized understanding;Returned demonstration       OT Short Term Goals - 09/25/18 1242      OT SHORT TERM GOAL #1   Title  Pt will be provided with and educated on HEP to improve ability to complete daily tasks and leisure tasks with minimal pain.    Time  4    Period  Weeks    Status  On-going    Target Date  10/17/18      OT SHORT TERM GOAL #2   Title  Pt will decrease pain in RUE to 3/10 or less to improve ability to use RUE for housework tasks such as scrubbing.    Time  4    Period  Weeks    Status  On-going      OT SHORT TERM GOAL #3   Title  Pt will decrease fascial restrictions in RUE to minimal amounts or less to decrease pain level during functional reaching tasks.    Time  4    Period  Weeks    Status  On-going      OT SHORT TERM GOAL #4   Title  Pt will increase RUE strength to 5/5 to improve ability to lift and carry weighted pots and pans without need for rest break or to quickly sit pans down.    Time  4    Period  Weeks    Status  On-going               Plan - 09/30/18 1404    Clinical Impression Statement  A: Continued with manual therapy, working on lateral forearm as well. Added wrist extensor stretch, pt reporting pulling/stretching sensation with exercise. Added scapular strengthening exercises, as well as exercises requiring active thoracic extension. Pt with max difficulty with scapular retraction during  prone exercises. Attempted wrist exercises targeting forearm, no pain or difficulty with exercises. Verbal cuing for form and technique during session.    Body Structure / Function / Physical Skills  UE functional use;Fascial  restriction;Pain;Strength    Plan  P: target scapular strengthening and continue incorporating thoracic extensors. follow up on wrist extensor stretch       Patient will benefit from skilled therapeutic intervention in order to improve the following deficits and impairments:   Body Structure / Function / Physical Skills: UE functional use, Fascial restriction, Pain, Strength       Visit Diagnosis: 1. Chronic right shoulder pain   2. Other symptoms and signs involving the musculoskeletal system       Problem List Patient Active Problem List   Diagnosis Date Noted  . History of colonic polyps   . Diverticulosis of colon without hemorrhage   . Encounter for screening colonoscopy 05/18/2015   Guadelupe Sabin, OTR/L  [phone] 09/30/2018, 2:07 PM  [facility] [address] [place], [place], [address] Phone: [phone]   Fax:  [phone]  Name: Desiree Barber MRN: [id number] Date of Birth: 1951/01/25

## 2018-09-30 NOTE — Patient Instructions (Signed)
Wrist extensor stretch: Hold for 30", complete 3x, 2x/day  1) While standing or sitting upright, hold your injured arm straight out in front of you and point your fingers down toward the ground. With the hand of the uninjured arm, grasp the hand of the injured arm, thumb pressing on the palm, and try to bend the wrist further towards your body.

## 2018-10-01 DIAGNOSIS — N6011 Diffuse cystic mastopathy of right breast: Secondary | ICD-10-CM | POA: Diagnosis not present

## 2018-10-01 DIAGNOSIS — R928 Other abnormal and inconclusive findings on diagnostic imaging of breast: Secondary | ICD-10-CM | POA: Diagnosis not present

## 2018-10-01 DIAGNOSIS — N6489 Other specified disorders of breast: Secondary | ICD-10-CM | POA: Diagnosis not present

## 2018-10-01 DIAGNOSIS — R922 Inconclusive mammogram: Secondary | ICD-10-CM | POA: Diagnosis not present

## 2018-10-08 ENCOUNTER — Other Ambulatory Visit: Payer: Self-pay

## 2018-10-08 ENCOUNTER — Encounter (HOSPITAL_COMMUNITY): Payer: Self-pay | Admitting: Occupational Therapy

## 2018-10-08 ENCOUNTER — Ambulatory Visit (HOSPITAL_COMMUNITY): Payer: Medicare Other | Admitting: Occupational Therapy

## 2018-10-08 DIAGNOSIS — G8929 Other chronic pain: Secondary | ICD-10-CM

## 2018-10-08 DIAGNOSIS — M25511 Pain in right shoulder: Secondary | ICD-10-CM | POA: Diagnosis not present

## 2018-10-08 DIAGNOSIS — R29898 Other symptoms and signs involving the musculoskeletal system: Secondary | ICD-10-CM

## 2018-10-08 NOTE — Patient Instructions (Addendum)
Flexibility: Neck Stretch   Grasp left arm above wrist and pull down across body while gently tilting head same direction. Hold for 3 seconds. Repeat 10 times.   Levator Scapula Stretch   Place left hand on same side shoulder blade. With other hand, gently stretch head down and away. Hold 3 seconds. Repeat 10 times.   Flexibility: Neck Stretch   Grasp left arm above wrist and pull down across body while gently tilting head same direction. Hold 3 seconds. Repeat 10 times.  Flexibility: Neck Retraction   Pull head straight back, keeping eyes and jaw level. *Give yourself a double chin.* Hold 3 seconds. Repeat 10 times.   Lower Cervical / Upper Thoracic Stretch   Clasp hands together in front with arms extended. Gently pull shoulder blades apart and bend head forward. Hold 3 seconds. Repeat 10 times.

## 2018-10-08 NOTE — Therapy (Signed)
[place], [place], [address] Phone: [phone]   Fax:  [phone]  Occupational Therapy Reassessment, Treatment, Discharge  Patient Details  Name: Desiree Barber MRN: [id number] Date of Birth: 1950/11/02 Referring Provider (OT): Dr. Victorino December   Encounter Date: 10/08/2018  OT End of Session - 10/08/18 1202    Visit Number  4    Number of Visits  4    Date for OT Re-Evaluation  10/17/18    Authorization Type  1) Medicare A & B 2) Generic [employer]    OT Start Time  1113    OT Stop Time  1157    OT Time Calculation (min)  44 min    Activity Tolerance  Patient tolerated treatment well    Behavior During Therapy  [facility] for tasks assessed/performed       Past Medical History:  Diagnosis Date  . Anxiety    Occasional  . COPD (chronic obstructive pulmonary disease) ([place])     Past Surgical History:  Procedure Laterality Date  . COLONOSCOPY  02/23/04   RMR: normal rectum and colon.minimal internal hemorrhoids  . COLONOSCOPY WITH PROPOFOL N/A 06/13/2015   Procedure: COLONOSCOPY WITH PROPOFOL;  Surgeon: Daneil Dolin, MD;  [facility];  Service: Endoscopy;  Laterality: N/A;  1045  . POLYPECTOMY  06/13/2015   Procedure: POLYPECTOMY;  Surgeon: Daneil Dolin, MD;  Location: [facility];  Service: Endoscopy;;  sigmoid colon polyp  . TOOTH EXTRACTION    . TRIGGER FINGER RELEASE Bilateral   . TUBAL LIGATION      There were no vitals filed for this visit.  Subjective Assessment - 10/08/18 1111    Subjective   S: The shoulder blade is fine.    Currently in Pain?  No/denies         [facility] OT Assessment - 10/08/18 1111      Assessment   Medical Diagnosis  right shoulder pain      Precautions   Precautions  None      Palpation   Palpation comment  small muscle knot in trapezius      AROM   Overall AROM Comments  Assessed seated, er/IR adducted. Cervical ROM is WNL    AROM Assessment Site   Shoulder    Right/Left Shoulder  Right    Right Shoulder Flexion  180 Degrees   170 previous   Right Shoulder ABduction  180 Degrees   same as previous   Right Shoulder Internal Rotation  90 Degrees   same as previous   Right Shoulder External Rotation  90 Degrees   same as previous     PROM   Overall PROM Comments  P/ROM is WNL      Strength   Overall Strength Comments  Assessed seated, er/IR adducted    Strength Assessment Site  Shoulder    Right/Left Shoulder  Right    Right Shoulder Flexion  5/5   4+/5 previous   Right Shoulder ABduction  5/5   4/5 previous   Right Shoulder Internal Rotation  5/5   same as previous   Right Shoulder External Rotation  5/5   4+/5 previous              OT Treatments/Exercises (OP) - 10/08/18 1127      ADLs   ADL Comments  Educated on computer ergonomics to improve posture and decrease fatigue during computer work.       Exercises  Exercises  Shoulder;Wrist;Neck      Neck Exercises: Stretches   Levator Stretch  2 reps;10 seconds    Neck Stretch  2 reps;10 seconds    Lower Cervical/Upper Thoracic Stretch  2 reps;10 seconds      Shoulder Exercises: Supine   Protraction  PROM;5 reps    Horizontal ABduction  PROM;5 reps    External Rotation  PROM;5 reps    Internal Rotation  PROM;5 reps    Flexion  PROM;5 reps    ABduction  PROM;5 reps      Shoulder Exercises: Prone   Other Prone Exercises  prone [place]: H1 & H2, 10X, A/ROM      Shoulder Exercises: Standing   Other Standing Exercises  Y arms lift off; 10X; A/ROM      Shoulder Exercises: ROM/Strengthening   Ball on Wall  1' flexion, 1' abduction    Other ROM/Strengthening Exercises  wall slides with back to door: 10X keeping backs of hands against door and back straight             OT Education - 10/08/18 1205    Education Details  cervical neck stretches, ulnar nerve stretch on doorway, Biomedical scientist) Educated  Patient    Methods   Explanation;Demonstration;Handout    Comprehension  Verbalized understanding;Returned demonstration       OT Short Term Goals - 10/08/18 1202      OT SHORT TERM GOAL #1   Title  Pt will be provided with and educated on HEP to improve ability to complete daily tasks and leisure tasks with minimal pain.    Time  4    Period  Weeks    Status  Achieved    Target Date  10/17/18      OT SHORT TERM GOAL #2   Title  Pt will decrease pain in RUE to 3/10 or less to improve ability to use RUE for housework tasks such as scrubbing.    Time  4    Period  Weeks    Status  Achieved      OT SHORT TERM GOAL #3   Title  Pt will decrease fascial restrictions in RUE to minimal amounts or less to decrease pain level during functional reaching tasks.    Time  4    Period  Weeks    Status  Achieved      OT SHORT TERM GOAL #4   Title  Pt will increase RUE strength to 5/5 to improve ability to lift and carry weighted pots and pans without need for rest break or to quickly sit pans down.    Time  4    Period  Weeks    Status  Achieved               Plan - 10/08/18 1202    Clinical Impression Statement  A: Reassessment completed this session, pt has met all goals and is no longer experiencing pain in her shoulder or scapular region. Pt reports only has pain at the base of her scalp sometimes-cervical stretches provided to address. Pt's primary complaint is her forearm itching/burning, however has improved over the past week. Pt is demonstrating shoulder ROM, strength, and functional use WNL. Provided education today and asked pt to obtain new referral if forearm discomfort hasn't improved in 1 month. Pt verbalized understanding. Of note-pt is actively trying to stop smoking.    Body Structure / Function / Physical Skills  UE functional use;Fascial restriction;Pain;Strength  Plan  P: Discharge pt       Patient will benefit from skilled therapeutic intervention in order to improve the following  deficits and impairments:   Body Structure / Function / Physical Skills: UE functional use, Fascial restriction, Pain, Strength       Visit Diagnosis: 1. Chronic right shoulder pain   2. Other symptoms and signs involving the musculoskeletal system       Problem List Patient Active Problem List   Diagnosis Date Noted  . History of colonic polyps   . Diverticulosis of colon without hemorrhage   . Encounter for screening colonoscopy 05/18/2015   Guadelupe Sabin, OTR/L  [phone] 10/08/2018, 12:07 PM  [facility] [address] [place], [place], [address] Phone: [phone]   Fax:  [phone]  Name: Desiree Barber MRN: [id number] Date of Birth: December 18, 1950    OCCUPATIONAL THERAPY DISCHARGE SUMMARY  Visits from Start of Care: 4  Current functional level related to goals / functional outcomes: See above. Pt has met all goals and is now using her RUE as dominant during ADL and leisure tasks. No pain experienced on a daily basis, occasional cervical neck discomfort.    Remaining deficits: Right dorsal forearm itching and burning   Education / Equipment: HEP for cervical stretching, computer ergonomics Plan: Patient agrees to discharge.  Patient goals were met. Patient is being discharged due to meeting the stated rehab goals.  ?????

## 2018-10-13 DIAGNOSIS — R39198 Other difficulties with micturition: Secondary | ICD-10-CM | POA: Diagnosis not present

## 2018-10-13 DIAGNOSIS — Z681 Body mass index (BMI) 19 or less, adult: Secondary | ICD-10-CM | POA: Diagnosis not present

## 2018-10-22 ENCOUNTER — Encounter (HOSPITAL_COMMUNITY): Payer: Medicare Other | Admitting: Occupational Therapy

## 2018-11-03 DIAGNOSIS — L42 Pityriasis rosea: Secondary | ICD-10-CM | POA: Diagnosis not present

## 2018-11-03 DIAGNOSIS — Z681 Body mass index (BMI) 19 or less, adult: Secondary | ICD-10-CM | POA: Diagnosis not present

## 2018-11-06 DIAGNOSIS — Z681 Body mass index (BMI) 19 or less, adult: Secondary | ICD-10-CM | POA: Diagnosis not present

## 2018-11-06 DIAGNOSIS — L42 Pityriasis rosea: Secondary | ICD-10-CM | POA: Diagnosis not present

## 2018-12-09 DIAGNOSIS — Z681 Body mass index (BMI) 19 or less, adult: Secondary | ICD-10-CM | POA: Diagnosis not present

## 2018-12-09 DIAGNOSIS — Z23 Encounter for immunization: Secondary | ICD-10-CM | POA: Diagnosis not present

## 2018-12-09 DIAGNOSIS — Z1389 Encounter for screening for other disorder: Secondary | ICD-10-CM | POA: Diagnosis not present

## 2018-12-09 DIAGNOSIS — Z0001 Encounter for general adult medical examination with abnormal findings: Secondary | ICD-10-CM | POA: Diagnosis not present

## 2018-12-24 DIAGNOSIS — J449 Chronic obstructive pulmonary disease, unspecified: Secondary | ICD-10-CM | POA: Diagnosis not present

## 2018-12-24 DIAGNOSIS — I10 Essential (primary) hypertension: Secondary | ICD-10-CM | POA: Diagnosis not present

## 2018-12-24 DIAGNOSIS — M5030 Other cervical disc degeneration, unspecified cervical region: Secondary | ICD-10-CM | POA: Diagnosis not present

## 2019-01-20 DIAGNOSIS — H2513 Age-related nuclear cataract, bilateral: Secondary | ICD-10-CM | POA: Diagnosis not present

## 2019-01-20 DIAGNOSIS — H02834 Dermatochalasis of left upper eyelid: Secondary | ICD-10-CM | POA: Diagnosis not present

## 2019-01-20 DIAGNOSIS — H04123 Dry eye syndrome of bilateral lacrimal glands: Secondary | ICD-10-CM | POA: Diagnosis not present

## 2019-01-20 DIAGNOSIS — H02831 Dermatochalasis of right upper eyelid: Secondary | ICD-10-CM | POA: Diagnosis not present

## 2019-03-26 DIAGNOSIS — I10 Essential (primary) hypertension: Secondary | ICD-10-CM | POA: Diagnosis not present

## 2019-03-26 DIAGNOSIS — J449 Chronic obstructive pulmonary disease, unspecified: Secondary | ICD-10-CM | POA: Diagnosis not present

## 2019-03-26 DIAGNOSIS — Z720 Tobacco use: Secondary | ICD-10-CM | POA: Diagnosis not present

## 2019-03-27 DIAGNOSIS — F17210 Nicotine dependence, cigarettes, uncomplicated: Secondary | ICD-10-CM | POA: Insufficient documentation

## 2019-03-27 DIAGNOSIS — F419 Anxiety disorder, unspecified: Secondary | ICD-10-CM | POA: Diagnosis present

## 2019-03-27 DIAGNOSIS — K5790 Diverticulosis of intestine, part unspecified, without perforation or abscess without bleeding: Secondary | ICD-10-CM | POA: Insufficient documentation

## 2019-03-30 DIAGNOSIS — Z681 Body mass index (BMI) 19 or less, adult: Secondary | ICD-10-CM | POA: Diagnosis not present

## 2019-03-30 DIAGNOSIS — M7551 Bursitis of right shoulder: Secondary | ICD-10-CM | POA: Diagnosis not present

## 2019-04-27 DIAGNOSIS — M79601 Pain in right arm: Secondary | ICD-10-CM | POA: Diagnosis not present

## 2019-04-27 DIAGNOSIS — Z681 Body mass index (BMI) 19 or less, adult: Secondary | ICD-10-CM | POA: Diagnosis not present

## 2019-04-27 DIAGNOSIS — R000 Tachycardia, unspecified: Secondary | ICD-10-CM | POA: Diagnosis not present

## 2019-05-18 DIAGNOSIS — M4722 Other spondylosis with radiculopathy, cervical region: Secondary | ICD-10-CM | POA: Diagnosis not present

## 2019-05-18 DIAGNOSIS — I10 Essential (primary) hypertension: Secondary | ICD-10-CM | POA: Diagnosis not present

## 2019-05-18 DIAGNOSIS — M25511 Pain in right shoulder: Secondary | ICD-10-CM | POA: Diagnosis not present

## 2019-05-22 DIAGNOSIS — M65811 Other synovitis and tenosynovitis, right shoulder: Secondary | ICD-10-CM | POA: Diagnosis not present

## 2019-05-22 DIAGNOSIS — M50122 Cervical disc disorder at C5-C6 level with radiculopathy: Secondary | ICD-10-CM | POA: Diagnosis not present

## 2019-05-22 DIAGNOSIS — M19011 Primary osteoarthritis, right shoulder: Secondary | ICD-10-CM | POA: Diagnosis not present

## 2019-05-22 DIAGNOSIS — S46011A Strain of muscle(s) and tendon(s) of the rotator cuff of right shoulder, initial encounter: Secondary | ICD-10-CM | POA: Diagnosis not present

## 2019-05-22 DIAGNOSIS — M4722 Other spondylosis with radiculopathy, cervical region: Secondary | ICD-10-CM | POA: Diagnosis not present

## 2019-05-22 DIAGNOSIS — M67911 Unspecified disorder of synovium and tendon, right shoulder: Secondary | ICD-10-CM | POA: Diagnosis not present

## 2019-05-24 DIAGNOSIS — J449 Chronic obstructive pulmonary disease, unspecified: Secondary | ICD-10-CM | POA: Diagnosis not present

## 2019-05-24 DIAGNOSIS — I10 Essential (primary) hypertension: Secondary | ICD-10-CM | POA: Diagnosis not present

## 2019-05-24 DIAGNOSIS — Z720 Tobacco use: Secondary | ICD-10-CM | POA: Diagnosis not present

## 2019-05-26 DIAGNOSIS — M19211 Secondary osteoarthritis, right shoulder: Secondary | ICD-10-CM | POA: Diagnosis not present

## 2019-05-26 DIAGNOSIS — M4722 Other spondylosis with radiculopathy, cervical region: Secondary | ICD-10-CM | POA: Diagnosis not present

## 2019-05-26 DIAGNOSIS — I10 Essential (primary) hypertension: Secondary | ICD-10-CM | POA: Diagnosis not present

## 2019-06-01 DIAGNOSIS — M67912 Unspecified disorder of synovium and tendon, left shoulder: Secondary | ICD-10-CM | POA: Diagnosis not present

## 2019-06-03 DIAGNOSIS — R531 Weakness: Secondary | ICD-10-CM | POA: Diagnosis not present

## 2019-06-03 DIAGNOSIS — R293 Abnormal posture: Secondary | ICD-10-CM | POA: Diagnosis not present

## 2019-06-03 DIAGNOSIS — M25611 Stiffness of right shoulder, not elsewhere classified: Secondary | ICD-10-CM | POA: Diagnosis not present

## 2019-06-03 DIAGNOSIS — M25511 Pain in right shoulder: Secondary | ICD-10-CM | POA: Diagnosis not present

## 2019-06-05 DIAGNOSIS — R531 Weakness: Secondary | ICD-10-CM | POA: Diagnosis not present

## 2019-06-05 DIAGNOSIS — M25511 Pain in right shoulder: Secondary | ICD-10-CM | POA: Diagnosis not present

## 2019-06-05 DIAGNOSIS — R293 Abnormal posture: Secondary | ICD-10-CM | POA: Diagnosis not present

## 2019-06-05 DIAGNOSIS — M25611 Stiffness of right shoulder, not elsewhere classified: Secondary | ICD-10-CM | POA: Diagnosis not present

## 2019-06-08 DIAGNOSIS — M25611 Stiffness of right shoulder, not elsewhere classified: Secondary | ICD-10-CM | POA: Diagnosis not present

## 2019-06-08 DIAGNOSIS — M25511 Pain in right shoulder: Secondary | ICD-10-CM | POA: Diagnosis not present

## 2019-06-08 DIAGNOSIS — R293 Abnormal posture: Secondary | ICD-10-CM | POA: Diagnosis not present

## 2019-06-08 DIAGNOSIS — R531 Weakness: Secondary | ICD-10-CM | POA: Diagnosis not present

## 2019-06-10 DIAGNOSIS — R531 Weakness: Secondary | ICD-10-CM | POA: Diagnosis not present

## 2019-06-10 DIAGNOSIS — M25611 Stiffness of right shoulder, not elsewhere classified: Secondary | ICD-10-CM | POA: Diagnosis not present

## 2019-06-10 DIAGNOSIS — M25511 Pain in right shoulder: Secondary | ICD-10-CM | POA: Diagnosis not present

## 2019-06-10 DIAGNOSIS — R293 Abnormal posture: Secondary | ICD-10-CM | POA: Diagnosis not present

## 2019-06-15 DIAGNOSIS — M25611 Stiffness of right shoulder, not elsewhere classified: Secondary | ICD-10-CM | POA: Diagnosis not present

## 2019-06-15 DIAGNOSIS — R293 Abnormal posture: Secondary | ICD-10-CM | POA: Diagnosis not present

## 2019-06-15 DIAGNOSIS — R531 Weakness: Secondary | ICD-10-CM | POA: Diagnosis not present

## 2019-06-15 DIAGNOSIS — M25511 Pain in right shoulder: Secondary | ICD-10-CM | POA: Diagnosis not present

## 2019-06-22 DIAGNOSIS — M25511 Pain in right shoulder: Secondary | ICD-10-CM | POA: Diagnosis not present

## 2019-06-22 DIAGNOSIS — R293 Abnormal posture: Secondary | ICD-10-CM | POA: Diagnosis not present

## 2019-06-22 DIAGNOSIS — R531 Weakness: Secondary | ICD-10-CM | POA: Diagnosis not present

## 2019-06-22 DIAGNOSIS — M25611 Stiffness of right shoulder, not elsewhere classified: Secondary | ICD-10-CM | POA: Diagnosis not present

## 2019-06-24 DIAGNOSIS — I10 Essential (primary) hypertension: Secondary | ICD-10-CM | POA: Diagnosis not present

## 2019-06-24 DIAGNOSIS — M25511 Pain in right shoulder: Secondary | ICD-10-CM | POA: Diagnosis not present

## 2019-06-24 DIAGNOSIS — M25611 Stiffness of right shoulder, not elsewhere classified: Secondary | ICD-10-CM | POA: Diagnosis not present

## 2019-06-24 DIAGNOSIS — R293 Abnormal posture: Secondary | ICD-10-CM | POA: Diagnosis not present

## 2019-06-24 DIAGNOSIS — J449 Chronic obstructive pulmonary disease, unspecified: Secondary | ICD-10-CM | POA: Diagnosis not present

## 2019-06-24 DIAGNOSIS — Z720 Tobacco use: Secondary | ICD-10-CM | POA: Diagnosis not present

## 2019-06-24 DIAGNOSIS — R531 Weakness: Secondary | ICD-10-CM | POA: Diagnosis not present

## 2019-06-26 DIAGNOSIS — M25611 Stiffness of right shoulder, not elsewhere classified: Secondary | ICD-10-CM | POA: Diagnosis not present

## 2019-06-26 DIAGNOSIS — R531 Weakness: Secondary | ICD-10-CM | POA: Diagnosis not present

## 2019-06-26 DIAGNOSIS — R293 Abnormal posture: Secondary | ICD-10-CM | POA: Diagnosis not present

## 2019-06-26 DIAGNOSIS — M25511 Pain in right shoulder: Secondary | ICD-10-CM | POA: Diagnosis not present

## 2019-06-29 DIAGNOSIS — M67912 Unspecified disorder of synovium and tendon, left shoulder: Secondary | ICD-10-CM | POA: Diagnosis not present

## 2019-07-07 DIAGNOSIS — M4722 Other spondylosis with radiculopathy, cervical region: Secondary | ICD-10-CM | POA: Diagnosis not present

## 2019-07-07 DIAGNOSIS — I10 Essential (primary) hypertension: Secondary | ICD-10-CM | POA: Diagnosis not present

## 2019-07-10 DIAGNOSIS — R58 Hemorrhage, not elsewhere classified: Secondary | ICD-10-CM | POA: Diagnosis not present

## 2019-07-10 DIAGNOSIS — Z681 Body mass index (BMI) 19 or less, adult: Secondary | ICD-10-CM | POA: Diagnosis not present

## 2019-07-10 DIAGNOSIS — M6210 Other rupture of muscle (nontraumatic), unspecified site: Secondary | ICD-10-CM | POA: Diagnosis not present

## 2019-07-10 DIAGNOSIS — I10 Essential (primary) hypertension: Secondary | ICD-10-CM | POA: Diagnosis not present

## 2019-07-10 DIAGNOSIS — J449 Chronic obstructive pulmonary disease, unspecified: Secondary | ICD-10-CM | POA: Diagnosis not present

## 2019-07-15 DIAGNOSIS — M25511 Pain in right shoulder: Secondary | ICD-10-CM | POA: Diagnosis not present

## 2019-07-20 DIAGNOSIS — Z1152 Encounter for screening for COVID-19: Secondary | ICD-10-CM | POA: Diagnosis not present

## 2019-07-24 ENCOUNTER — Emergency Department (HOSPITAL_COMMUNITY): Payer: Medicare Other

## 2019-07-24 ENCOUNTER — Observation Stay (HOSPITAL_COMMUNITY)
Admission: EM | Admit: 2019-07-24 | Discharge: 2019-07-25 | Disposition: A | Discharge status: Home Health | Payer: Medicare Other | Attending: Internal Medicine | Admitting: Internal Medicine

## 2019-07-24 ENCOUNTER — Other Ambulatory Visit: Payer: Self-pay

## 2019-07-24 ENCOUNTER — Encounter (HOSPITAL_COMMUNITY): Payer: Self-pay

## 2019-07-24 DIAGNOSIS — F17210 Nicotine dependence, cigarettes, uncomplicated: Secondary | ICD-10-CM | POA: Diagnosis not present

## 2019-07-24 DIAGNOSIS — J9601 Acute respiratory failure with hypoxia: Secondary | ICD-10-CM | POA: Diagnosis not present

## 2019-07-24 DIAGNOSIS — R0902 Hypoxemia: Secondary | ICD-10-CM | POA: Diagnosis not present

## 2019-07-24 DIAGNOSIS — J811 Chronic pulmonary edema: Secondary | ICD-10-CM | POA: Insufficient documentation

## 2019-07-24 DIAGNOSIS — I10 Essential (primary) hypertension: Secondary | ICD-10-CM | POA: Diagnosis not present

## 2019-07-24 DIAGNOSIS — Z981 Arthrodesis status: Secondary | ICD-10-CM | POA: Insufficient documentation

## 2019-07-24 DIAGNOSIS — M1990 Unspecified osteoarthritis, unspecified site: Secondary | ICD-10-CM | POA: Insufficient documentation

## 2019-07-24 DIAGNOSIS — M50223 Other cervical disc displacement at C6-C7 level: Secondary | ICD-10-CM | POA: Diagnosis not present

## 2019-07-24 DIAGNOSIS — M2578 Osteophyte, vertebrae: Secondary | ICD-10-CM | POA: Diagnosis not present

## 2019-07-24 DIAGNOSIS — J449 Chronic obstructive pulmonary disease, unspecified: Secondary | ICD-10-CM | POA: Insufficient documentation

## 2019-07-24 DIAGNOSIS — M4722 Other spondylosis with radiculopathy, cervical region: Secondary | ICD-10-CM | POA: Diagnosis not present

## 2019-07-24 DIAGNOSIS — J90 Pleural effusion, not elsewhere classified: Secondary | ICD-10-CM | POA: Diagnosis not present

## 2019-07-24 DIAGNOSIS — M50222 Other cervical disc displacement at C5-C6 level: Secondary | ICD-10-CM | POA: Diagnosis not present

## 2019-07-24 DIAGNOSIS — Z885 Allergy status to narcotic agent status: Secondary | ICD-10-CM | POA: Diagnosis not present

## 2019-07-24 DIAGNOSIS — Z9981 Dependence on supplemental oxygen: Secondary | ICD-10-CM | POA: Insufficient documentation

## 2019-07-24 DIAGNOSIS — T819XXA Unspecified complication of procedure, initial encounter: Secondary | ICD-10-CM | POA: Diagnosis present

## 2019-07-24 DIAGNOSIS — D649 Anemia, unspecified: Secondary | ICD-10-CM | POA: Diagnosis present

## 2019-07-24 DIAGNOSIS — Z79899 Other long term (current) drug therapy: Secondary | ICD-10-CM | POA: Diagnosis not present

## 2019-07-24 DIAGNOSIS — Z20822 Contact with and (suspected) exposure to covid-19: Secondary | ICD-10-CM | POA: Insufficient documentation

## 2019-07-24 DIAGNOSIS — Z720 Tobacco use: Secondary | ICD-10-CM | POA: Diagnosis not present

## 2019-07-24 HISTORY — DX: Essential (primary) hypertension: I10

## 2019-07-24 HISTORY — DX: Unspecified osteoarthritis, unspecified site: M19.90

## 2019-07-24 LAB — BASIC METABOLIC PANEL
Anion gap: 9 (ref 5–15)
BUN: 7 mg/dL — ABNORMAL LOW (ref 8–23)
CO2: 24 mmol/L (ref 22–32)
Calcium: 7.8 mg/dL — ABNORMAL LOW (ref 8.9–10.3)
Chloride: 105 mmol/L (ref 98–111)
Creatinine, Ser: 0.62 mg/dL (ref 0.44–1.00)
GFR calc Af Amer: >60 mL/min (ref >60)
GFR calc non Af Amer: >60 mL/min (ref >60)
Glucose, Bld: 169 mg/dL — ABNORMAL HIGH (ref 70–99)
Potassium: 4 mmol/L (ref 3.5–5.1)
Sodium: 138 mmol/L (ref 135–145)

## 2019-07-24 LAB — CBC
HCT: 31.2 % — ABNORMAL LOW (ref 36.0–46.0)
Hemoglobin: 8.8 g/dL — ABNORMAL LOW (ref 12.0–15.0)
MCH: 24.6 pg — ABNORMAL LOW (ref 26.0–34.0)
MCHC: 28.2 g/dL — ABNORMAL LOW (ref 30.0–36.0)
MCV: 87.2 fL (ref 80.0–100.0)
Platelets: 507 10*3/uL — ABNORMAL HIGH (ref 150–400)
RBC: 3.58 MIL/uL — ABNORMAL LOW (ref 3.87–5.11)
RDW: 17 % — ABNORMAL HIGH (ref 11.5–15.5)
WBC: 20.9 10*3/uL — ABNORMAL HIGH (ref 4.0–10.5)
nRBC: 0 % (ref 0.0–0.2)

## 2019-07-24 NOTE — ED Provider Notes (Signed)
[facility] EMERGENCY DEPARTMENT Provider Note   CSN: [id number] Arrival date & time: 07/24/19  2145     History Chief Complaint  Patient presents with  . Hypoxia    Desiree Barber is a 69 y.o. female with a history of COPD, HTN, and anxiety who presents the emergency department by EMS from the [facility] specialty surgical center with a chief complaint of hypoxia.  The patient underwent ACDF with Dr. Christella Noa this afternoon from approximately 13:00 to 16:00.  During the procedure, she was given Decadron, Versed, fentanyl, Toradol, rocuronium.  Patient was initially satting at 96% on 6 L after the procedure.  She was initially nauseated, which improved with Reglan.  She was able to sit up in a chair for several minutes on room air, but then oxygen saturation decreased to 88% and she was placed on 4 L nasal cannula.  She was able to decrease down to 2 L.  Around 18:20, ambulation was attempted, but she was unable to ambulate in the home due to an unsteady gait.  She was again endorsing nausea.  She remained on 2 L nasal cannula with oxygen saturation in the mid 90s.  She was able to tolerate sipping clear liquids, but continues to endorse generally not feeling well.  Stating that she felt as if she could not wake up.  Oxygen was able to be titrated down to 0.5 L nasal cannula with oxygen remaining at 92 to 94%.  At 19:10, she was able to ambulate with two-person assist due to unsteady gait.  She has been unable to void and bladder scan demonstrated 550.  She was titrated down to room air, but oxygen saturation decreased down to 88%.  She was instructed to cough and was started on incentive spirometry.  She denied any shortness of breath, respiratory distress.  With hypoxia, she was then again placed on 3 L. At 20:10, ambulation was attempted again with 2 nurses to assist.  She was able to void 100 cc clear yellow urine.  However, oxygen desaturated down to 79 to 80% after she was  on room air for 7 minutes.  She was again placed on 2 L nasal cannula and oxygen saturation increased to 97%.  At this time, Dr. Venetia Constable was notified about the patient's status and hypoxia on room air and she was advised to come to the emergency department for further work-up and evaluation.  The patient reports that she has been having a productive cough for the last few days with clear sputum.  No recent shortness of breath, chest pain, leg swelling, fever, chills, abdominal pain, nausea, vomiting, diarrhea.  Her only other complaint at this time is that she is very sleepy.  She reports that about a month ago that a potato basket fell onto her right arm and she has persistent bruising noted to the right forearm and upper arm.  No other recent falls or injuries.  Reports that she has had previous surgeries with general anesthesia without any difficulty following the procedure.  The history is provided by the patient. No language interpreter was used.       Past Medical History:  Diagnosis Date  . Anxiety    Occasional  . Arthritis   . COPD (chronic obstructive pulmonary disease) ([place])   . HTN (hypertension)     Patient Active Problem List   Diagnosis Date Noted  . Acute respiratory failure with hypoxia ([place]) 07/25/2019  . HTN (hypertension) 07/25/2019  . Post-operative complication  07/25/2019  . Anemia 07/25/2019  . History of colonic polyps   . Diverticulosis of colon without hemorrhage   . Encounter for screening colonoscopy 05/18/2015    Past Surgical History:  Procedure Laterality Date  . COLONOSCOPY  02/23/04   RMR: normal rectum and colon.minimal internal hemorrhoids  . COLONOSCOPY WITH PROPOFOL N/A 06/13/2015   Procedure: COLONOSCOPY WITH PROPOFOL;  Surgeon: Daneil Dolin, MD;  [facility];  Service: Endoscopy;  Laterality: N/A;  1045  . POLYPECTOMY  06/13/2015   Procedure: POLYPECTOMY;  Surgeon: Daneil Dolin, MD;  [facility];  Service:  Endoscopy;;  sigmoid colon polyp  . TOOTH EXTRACTION    . TRIGGER FINGER RELEASE Bilateral   . TUBAL LIGATION       OB History   No obstetric history on file.     Family History  Problem Relation Age of Onset  . Colon cancer Neg Hx     Social History   Tobacco Use  . Smoking status: Current Every Day Smoker    Packs/day: 0.50    Years: 30.00    Pack years: 15.00    Types: Cigarettes  . Smokeless tobacco: Never Used  Substance Use Topics  . Alcohol use: Yes    Alcohol/week: 0.0 standard drinks    Comment: 3 beers a day  . Drug use: No    Home Medications Prior to Admission medications   Medication Sig Start Date End Date Taking? Authorizing Provider  lisinopril (PRINIVIL,ZESTRIL) 5 MG tablet Take 0.5 tablets by mouth daily. 04/24/18  Yes [provider]    Allergies    Demerol [meperidine], Oxycontin [oxycodone hcl], and Fish allergy  Review of Systems   Review of Systems  Constitutional: Positive for fatigue. Negative for activity change, chills, diaphoresis and fever.  HENT: Negative for congestion, sinus pressure, sinus pain, sore throat and voice change.   Respiratory: Positive for cough. Negative for shortness of breath and wheezing.   Cardiovascular: Negative for chest pain, palpitations and leg swelling.  Gastrointestinal: Negative for abdominal pain, blood in stool, diarrhea, nausea and vomiting.  Genitourinary: Negative for dysuria, frequency, vaginal bleeding and vaginal pain.  Musculoskeletal: Negative for back pain.  Skin: Negative for rash.  Allergic/Immunologic: Negative for immunocompromised state.  Neurological: Negative for dizziness, seizures, syncope, speech difficulty, weakness, light-headedness, numbness and headaches.  Psychiatric/Behavioral: Negative for confusion.    Physical Exam Updated Vital Signs BP 114/64   Pulse 85   Temp 97.7 F (36.5 C) (Oral)   Resp 17   Ht 5' 2.5" (1.588 m)   Wt 41.7 kg   SpO2 97%   BMI 16.56  kg/m   Physical Exam Vitals and nursing note reviewed.  Constitutional:      General: She is not in acute distress.    Comments: Thin, elderly female Nasal cannula in place.  HENT:     Head: Normocephalic.  Eyes:     Conjunctiva/sclera: Conjunctivae normal.  Cardiovascular:     Rate and Rhythm: Normal rate and regular rhythm.     Pulses: Normal pulses.     Heart sounds: Normal heart sounds. No murmur. No friction rub. No gallop.   Pulmonary:     Effort: Pulmonary effort is normal. No respiratory distress.     Breath sounds: No stridor. No wheezing, rhonchi or rales.     Comments: Lungs are clear to auscultation bilaterally.  No increased work of breathing. Chest:     Chest wall: No tenderness.  Abdominal:  General: There is no distension.     Palpations: Abdomen is soft. There is no mass.     Tenderness: There is no abdominal tenderness. There is no right CVA tenderness, left CVA tenderness, guarding or rebound.     Hernia: No hernia is present.  Musculoskeletal:     Cervical back: Neck supple.     Right lower leg: No edema.     Left lower leg: No edema.     Comments: Large bruise noted to the right forearm.  There are several small areas of healing bruises noted to the right upper arm.  Skin:    General: Skin is warm.     Findings: No rash.  Neurological:     Mental Status: She is alert.  Psychiatric:        Behavior: Behavior normal.     ED Results / Procedures / Treatments   Labs (all labs ordered are listed, but only abnormal results are displayed) Labs Reviewed  CBC - Abnormal; Notable for the following components:      Result Value   WBC 20.9 (*)    RBC 3.58 (*)    Hemoglobin 8.8 (*)    HCT 31.2 (*)    MCH 24.6 (*)    MCHC 28.2 (*)    RDW 17.0 (*)    Platelets 507 (*)    All other components within normal limits  BASIC METABOLIC PANEL - Abnormal; Notable for the following components:   Glucose, Bld 169 (*)    BUN 7 (*)    Calcium 7.8 (*)    All  other components within normal limits  RESPIRATORY PANEL BY RT PCR (FLU A&B, COVID)  BRAIN NATRIURETIC PEPTIDE  HIV ANTIBODY (ROUTINE TESTING W REFLEX)  BRAIN NATRIURETIC PEPTIDE  CBC  BASIC METABOLIC PANEL  TROPONIN I (HIGH SENSITIVITY)  TROPONIN I (HIGH SENSITIVITY)    EKG EKG Interpretation  Date/Time:  Saturday Jul 25 2019 02:07:54 EDT Ventricular Rate:  89 PR Interval:  148 QRS Duration: 96 QT Interval:  384 QTC Calculation: 467 R Axis:   58 Text Interpretation: Normal sinus rhythm Incomplete right bundle branch block Cannot rule out Anterior infarct , age undetermined Abnormal ECG No significant change was found Confirmed by Ezequiel Essex [phone]) on 07/25/2019 3:06:19 AM   Radiology DG Chest 2 View  Result Date: 07/24/2019 CLINICAL DATA:  Postop hypoxia EXAM: CHEST - 2 VIEW COMPARISON:  04/21/2018 FINDINGS: Cardiac shadows within normal limits. The lungs are hyperinflated. Mild interstitial edema is seen with minimal posterior effusions. No focal infiltrate is seen. No bony abnormality is noted. Postsurgical changes are noted in the cervical spine. IMPRESSION: Mild interstitial edema with small effusions. Electronically Signed   By: Inez Catalina M.D.   On: 07/24/2019 23:24    Procedures .Critical Care Performed by: Joanne Gavel, PA-C Authorized by: Joanne Gavel, PA-C   Critical care provider statement:    Critical care time (minutes):  40   Critical care time was exclusive of:  Separately billable procedures and treating other patients and teaching time   Critical care was necessary to treat or prevent imminent or life-threatening deterioration of the following conditions:  Respiratory failure   Critical care was time spent personally by me on the following activities:  Ordering and performing treatments and interventions, ordering and review of laboratory studies, ordering and review of radiographic studies, pulse oximetry, re-evaluation of patient's condition,  obtaining history from patient or surrogate, examination of patient, evaluation of patient's response to  treatment and development of treatment plan with patient or surrogate   I assumed direction of critical care for this patient from another provider in my specialty: no     (including critical care time)  Medications Ordered in ED Medications  ondansetron (ZOFRAN) tablet 4 mg (has no administration in time range)    Or  ondansetron (ZOFRAN) injection 4 mg (has no administration in time range)  lisinopril (ZESTRIL) tablet 2.5 mg (has no administration in time range)  HYDROcodone-acetaminophen (NORCO/VICODIN) 5-325 MG per tablet 1-2 tablet (1 tablet Oral Given 07/25/19 0505)  furosemide (LASIX) injection 20 mg (20 mg Intravenous Given 07/25/19 0308)    ED Course  I have reviewed the triage vital signs and the nursing notes.  Pertinent labs & imaging results that were available during my care of the patient were reviewed by me and considered in my medical decision making (see chart for details).    MDM Rules/Calculators/A&P                      69 year old female with a history of COPD, HTN, and anxiety presenting by EMS from the [facility] surgical center with hypoxia.  Patient was found to have sats in the upper 70s several hours after undergoing ACDF earlier today.  Patient's only complaint in the ER is that she is very sleepy.  The patient was seen and independently evaluated by Dr. Wyvonnia Dusky, attending physician, who is in agreement with work-up and plan.  Leukocytosis of 20.9.  Likely secondary to recent surgical procedure and corticosteroids given interoperatively.  Hemoglobin is 8.8.  No previous labs available for comparison.  Unsure if this is acute or chronic.  However, patient is having no active bleeding.  EKG with normal sinus rhythm.  Troponin is not elevated.  Patient is having no chest pain at this time.  Doubt ACS.  Patient does have a history of COPD, but less likely COPD  exacerbation his lungs are clear on exam.  Chest x-ray with mild interstitial edema with small effusions.  Although patient does not have a history of heart failure, could consider acute onset of heart failure possibly secondary to intraoperative fluids.  Will order BNP.  We will give 20 of Lasix.  Patient has had no urinary retention and has voided successfully in the emergency department.  She was ambulated by staff and oxygen saturation dropped to 85% on room air.  She is now currently on 2 L nasal cannula with oxygen saturation at 96%.  Did consider PE, but patient's only risk factor is her age.  She is having no chest pain and is denying shortness of breath at this time.  Spoke with Dr. Alcario Drought with a hospitalist team who accept the patient for admission.The patient appears reasonably stabilized for admission considering the current resources, flow, and capabilities available in the ED at this time, and I doubt any other [facility] requiring further screening and/or treatment in the ED prior to admission.  Final Clinical Impression(s) / ED Diagnoses Final diagnoses:  None    Rx / DC Orders ED Discharge Orders    None       Alekhya Gravlin A, PA-C 07/25/19 0540    Ezequiel Essex, MD 07/25/19 [id number]

## 2019-07-24 NOTE — ED Triage Notes (Signed)
Pt BIB [facility] for eval of hypoxia on ambulation after an ACDF at outpt surgery center today. Pt was ambulating after recovery and noted to drop to the 70s for staff, prompting them to call 911. EMS reports 100% on 2LPM during transport.

## 2019-07-25 ENCOUNTER — Encounter (HOSPITAL_COMMUNITY): Payer: Self-pay | Admitting: Internal Medicine

## 2019-07-25 ENCOUNTER — Observation Stay (HOSPITAL_BASED_OUTPATIENT_CLINIC_OR_DEPARTMENT_OTHER): Payer: Medicare Other

## 2019-07-25 DIAGNOSIS — I5031 Acute diastolic (congestive) heart failure: Secondary | ICD-10-CM

## 2019-07-25 DIAGNOSIS — I10 Essential (primary) hypertension: Secondary | ICD-10-CM | POA: Diagnosis not present

## 2019-07-25 DIAGNOSIS — J9601 Acute respiratory failure with hypoxia: Secondary | ICD-10-CM

## 2019-07-25 DIAGNOSIS — D649 Anemia, unspecified: Secondary | ICD-10-CM | POA: Diagnosis present

## 2019-07-25 DIAGNOSIS — I97131 Postprocedural heart failure following other surgery: Secondary | ICD-10-CM

## 2019-07-25 DIAGNOSIS — T819XXA Unspecified complication of procedure, initial encounter: Secondary | ICD-10-CM | POA: Diagnosis present

## 2019-07-25 LAB — CBC
HCT: 30.9 % — ABNORMAL LOW (ref 36.0–46.0)
Hemoglobin: 8.8 g/dL — ABNORMAL LOW (ref 12.0–15.0)
MCH: 24.6 pg — ABNORMAL LOW (ref 26.0–34.0)
MCHC: 28.5 g/dL — ABNORMAL LOW (ref 30.0–36.0)
MCV: 86.3 fL (ref 80.0–100.0)
Platelets: 448 10*3/uL — ABNORMAL HIGH (ref 150–400)
RBC: 3.58 MIL/uL — ABNORMAL LOW (ref 3.87–5.11)
RDW: 17.1 % — ABNORMAL HIGH (ref 11.5–15.5)
WBC: 18.7 10*3/uL — ABNORMAL HIGH (ref 4.0–10.5)
nRBC: 0 % (ref 0.0–0.2)

## 2019-07-25 LAB — BASIC METABOLIC PANEL
Anion gap: 7 (ref 5–15)
BUN: 9 mg/dL (ref 8–23)
CO2: 28 mmol/L (ref 22–32)
Calcium: 8.1 mg/dL — ABNORMAL LOW (ref 8.9–10.3)
Chloride: 101 mmol/L (ref 98–111)
Creatinine, Ser: 0.56 mg/dL (ref 0.44–1.00)
GFR calc Af Amer: >60 mL/min (ref >60)
GFR calc non Af Amer: >60 mL/min (ref >60)
Glucose, Bld: 110 mg/dL — ABNORMAL HIGH (ref 70–99)
Potassium: 6.2 mmol/L — ABNORMAL HIGH (ref 3.5–5.1)
Sodium: 136 mmol/L (ref 135–145)

## 2019-07-25 LAB — RESPIRATORY PANEL BY RT PCR (FLU A&B, COVID)
Influenza A by PCR: NEGATIVE
Influenza B by PCR: NEGATIVE
SARS Coronavirus 2 by RT PCR: NEGATIVE

## 2019-07-25 LAB — TROPONIN I (HIGH SENSITIVITY)
Troponin I (High Sensitivity): 7 ng/L (ref <18)
Troponin I (High Sensitivity): 8 ng/L (ref <18)

## 2019-07-25 LAB — ECHOCARDIOGRAM COMPLETE
Height: 62.5 in
Weight: 1472 oz

## 2019-07-25 LAB — HIV ANTIBODY (ROUTINE TESTING W REFLEX): HIV Screen 4th Generation wRfx: NONREACTIVE

## 2019-07-25 LAB — POTASSIUM: Potassium: 4.2 mmol/L (ref 3.5–5.1)

## 2019-07-25 LAB — BRAIN NATRIURETIC PEPTIDE: B Natriuretic Peptide: 109.6 pg/mL — ABNORMAL HIGH (ref 0.0–100.0)

## 2019-07-25 MED ORDER — ONDANSETRON HCL 4 MG PO TABS: 4.0000 mg | ORAL_TABLET | Freq: Four times a day (QID) | ORAL | Status: DC | PRN

## 2019-07-25 MED ORDER — SODIUM POLYSTYRENE SULFONATE 15 GM/60ML PO SUSP: 30.0000 g | Freq: Once | ORAL | Status: DC

## 2019-07-25 MED ORDER — FUROSEMIDE 10 MG/ML IJ SOLN
20.0000 mg | Freq: Once | INTRAMUSCULAR | Status: AC
Start: 2019-07-25 — End: 2019-07-25
  Administered 2019-07-25: 20 mg via INTRAVENOUS
  Filled 2019-07-25: qty 2

## 2019-07-25 MED ORDER — HYDROCODONE-ACETAMINOPHEN 5-325 MG PO TABS
1.0000 | ORAL_TABLET | Freq: Four times a day (QID) | ORAL | Status: DC | PRN
  Administered 2019-07-25: 1 via ORAL
  Filled 2019-07-25: qty 2

## 2019-07-25 MED ORDER — FUROSEMIDE 20 MG PO TABS
20.0000 mg | ORAL_TABLET | Freq: Every day | ORAL | 0 refills | Status: DC | PRN
Start: 2019-07-25 — End: 2022-01-05

## 2019-07-25 MED ORDER — ONDANSETRON HCL 4 MG/2ML IJ SOLN: 4.0000 mg | Freq: Four times a day (QID) | INTRAMUSCULAR | Status: DC | PRN

## 2019-07-25 MED ORDER — FUROSEMIDE 10 MG/ML IJ SOLN
20.0000 mg | Freq: Once | INTRAMUSCULAR | Status: AC
  Administered 2019-07-25: 15:00:00 20 mg via INTRAVENOUS
  Filled 2019-07-25: qty 2

## 2019-07-25 MED ORDER — LISINOPRIL 2.5 MG PO TABS
2.5000 mg | ORAL_TABLET | Freq: Every day | ORAL | Status: DC
  Filled 2019-07-25: qty 1

## 2019-07-25 NOTE — ED Notes (Signed)
Removed [place] and sat with pt to monitor SpO2 trend. Pt SpO2 remained above 94% for approx 5 mins before decreasing to 86% on RA. MD notified.

## 2019-07-25 NOTE — ED Notes (Signed)
Echo in progress.

## 2019-07-25 NOTE — Discharge Summary (Signed)
Physician Discharge Summary  Desiree Barber [id number] DOB: July 11, 1950 DOA: 07/24/2019  PCP: Cory Munch, PA-C  Admit date: 07/24/2019  Discharge date: 07/25/2019  Admitted From:Home  Disposition:  Home  Recommendations for Outpatient Follow-up:  1. Follow up with PCP in 1-2 weeks 2. Take Lasix as prescribed for any leg swelling, 3lb weight gain in 24 hour period, or worsening dyspnea 3. Follow up with Dr. Christella Noa as scheduled previously  Home Health:Yes with PT/OT  Equipment/Devices:Home O2, 2L [place]  Discharge Condition:Stable  CODE STATUS: Full  Diet recommendation: Heart Healthy  Brief/Interim Summary: Per HPI: Desiree Barber is a 69 y.o. female with medical history significant of COPD, HTN, anxiety, cervical disk disease.  Pt underwent ACDF with Dr. Christella Noa yesterday afternoon.  Post op pt with persistent O2 requirement initially.  Though slowly improving.  By time she was sent to ED only hypoxia with ambulation.  During procedure given Decadron, Versed, fentanyl, Toradol, rocuronium.  Unclear how much blood loss or fluid was given during surgery.  5/1: Patient was admitted with acute hypoxemic respiratory failure in the setting of recent ACDF and likely was volume overloaded based on CXR. She has diuresed well and is currently euvolemic, but is still requiring some oxygen at baseline. She will be discharged with home 2L  and can have this weaned in the outpatient setting. She will be given Lasix as needed for dyspnea or fluid overload. Follow up with PCP outpatient and continue pain medications as prescribed per Neurosurgery.  Discharge Diagnoses:  Principal Problem:   Acute respiratory failure with hypoxia (HCC) Active Problems:   HTN (hypertension)   Post-operative complication   Anemia  Acute hypoxemic respiratory failure secondary to mild pulmonary edema in setting of COPD with ongoing tobacco abuse.  Discharge Instructions  Discharge  Instructions    Diet - low sodium heart healthy   Complete by: As directed    Increase activity slowly   Complete by: As directed      Allergies as of 07/25/2019      Reactions   Demerol [meperidine]    Oxycontin [oxycodone Hcl]    Fish Allergy Nausea Only   Only fish from [place]      Medication List    TAKE these medications   furosemide 20 MG tablet Commonly known as: Lasix Take 1 tablet (20 mg total) by mouth daily as needed for fluid or edema (shortness of breath).   lisinopril 5 MG tablet Commonly known as: ZESTRIL Take 0.5 tablets by mouth daily.            Durable Medical Equipment  (From admission, onward)         Start     Ordered   07/25/19 1329  For home use only DME oxygen  Once    Question Answer Comment  Length of Need Lifetime   Mode or (Route) Nasal cannula   Liters per Minute 2   Frequency Continuous (stationary and portable oxygen unit needed)   Oxygen conserving device Yes   Oxygen delivery system Gas      07/25/19 1328         Follow-up Information    [facility] Follow up.   Why: Home Oxygen Therapy initiate prior to discharge from hospital Contact information: [place] [address] [phone]        [facility] Follow up.   Why: Home Health service Physical Therapy and Occupational Therapy. Contact information: [place]  [place] [address] [phone]        Cory Munch, PA-C Follow up in 1 week(s).   Specialties: Physician Assistant, Internal Medicine Contact information: [place] [address] [phone]          Allergies  Allergen Reactions  . Demerol [Meperidine]   . Oxycontin [Oxycodone Hcl]   . Fish Allergy Nausea Only    Only fish from [place]    Consultations:  None   Procedures/Studies: DG Chest 2 View  Result Date: 07/24/2019 CLINICAL DATA:  Postop hypoxia EXAM: CHEST - 2 VIEW  COMPARISON:  04/21/2018 FINDINGS: Cardiac shadows within normal limits. The lungs are hyperinflated. Mild interstitial edema is seen with minimal posterior effusions. No focal infiltrate is seen. No bony abnormality is noted. Postsurgical changes are noted in the cervical spine. IMPRESSION: Mild interstitial edema with small effusions. Electronically Signed   By: Inez Catalina M.D.   On: 07/24/2019 23:24   ECHOCARDIOGRAM COMPLETE  Result Date: 07/25/2019    ECHOCARDIOGRAM REPORT   Patient Name:   Desiree Barber Date of Exam: 07/25/2019 Medical Rec #:  [id number]         Height:       62.5 in Accession #:    [id number]        Weight:       92.0 lb Date of Birth:  1950-06-26         BSA:          1.383 m Patient Age:    23 years          BP:           127/62 mmHg Patient Gender: F                 HR:           90 bpm. Exam Location:  Inpatient Procedure: 2D Echo Indications:    CHF-Acute Diastolic [id number] / [id number]  History:        Patient has no prior history of Echocardiogram examinations.                 Risk Factors:Hypertension. Acute respiratory failure with                 hypoxia.  Sonographer:    Vikki Ports Turrentine Referring Phys: [phone] JARED M GARDNER  Sonographer Comments: Patient unable to tolerate probe in SSN window. IMPRESSIONS  1. Vigorous LV systolic function; grade 1 diastolic dysfunction.  2. Left ventricular ejection fraction, by estimation, is 70 to 75%. The left ventricle has hyperdynamic function. The left ventricle has no regional wall motion abnormalities. Left ventricular diastolic parameters are consistent with Grade I diastolic dysfunction (impaired relaxation).  3. Right ventricular systolic function is normal. The right ventricular size is normal.  4. The mitral valve is normal in structure. No evidence of mitral valve regurgitation. No evidence of mitral stenosis.  5. The aortic valve is tricuspid. Aortic valve regurgitation is not visualized. Mild to moderate aortic valve  sclerosis/calcification is present, without any evidence of aortic stenosis.  6. The inferior vena cava is normal in size with greater than 50% respiratory variability, suggesting right atrial pressure of 3 mmHg. FINDINGS  Left Ventricle: Left ventricular ejection fraction, by estimation, is 70 to 75%. The left ventricle has hyperdynamic function. The left ventricle has no regional wall motion abnormalities. The left ventricular internal cavity size was normal in size. There is no left ventricular hypertrophy. Left  ventricular diastolic parameters are consistent with Grade I diastolic dysfunction (impaired relaxation). Right Ventricle: The right ventricular size is normal. Right ventricular systolic function is normal. Left Atrium: Left atrial size was normal in size. Right Atrium: Right atrial size was normal in size. Pericardium: There is no evidence of pericardial effusion. Mitral Valve: The mitral valve is normal in structure. Normal mobility of the mitral valve leaflets. Moderate mitral annular calcification. No evidence of mitral valve regurgitation. No evidence of mitral valve stenosis. Tricuspid Valve: The tricuspid valve is normal in structure. Tricuspid valve regurgitation is not demonstrated. No evidence of tricuspid stenosis. Aortic Valve: The aortic valve is tricuspid. Aortic valve regurgitation is not visualized. Mild to moderate aortic valve sclerosis/calcification is present, without any evidence of aortic stenosis. Aortic valve mean gradient measures 7.0 mmHg. Aortic valve peak gradient measures 11.3 mmHg. Aortic valve area, by VTI measures 1.94 cm. Pulmonic Valve: The pulmonic valve was normal in structure. Pulmonic valve regurgitation is not visualized. No evidence of pulmonic stenosis. Aorta: The aortic root is normal in size and structure. Venous: The inferior vena cava is normal in size with greater than 50% respiratory variability, suggesting right atrial pressure of 3 mmHg. IAS/Shunts: No  atrial level shunt detected by color flow Doppler. Additional Comments: Vigorous LV systolic function; grade 1 diastolic dysfunction.  LEFT VENTRICLE PLAX 2D LVIDd:         3.70 cm  Diastology LVIDs:         2.40 cm  LV e' lateral:   8.27 cm/s LV PW:         0.90 cm  LV E/e' lateral: 14.5 LV IVS:        0.90 cm  LV e' medial:    7.72 cm/s LVOT diam:     1.60 cm  LV E/e' medial:  15.5 LV SV:         62 LV SV Index:   45 LVOT Area:     2.01 cm  RIGHT VENTRICLE RV S prime:     11.50 cm/s TAPSE (M-mode): 2.1 cm LEFT ATRIUM             Index       RIGHT ATRIUM           Index LA diam:        3.20 cm 2.31 cm/m  RA Area:     11.90 cm LA Vol (A2C):   32.2 ml 23.29 ml/m RA Volume:   27.30 ml  19.75 ml/m LA Vol (A4C):   27.4 ml 19.82 ml/m LA Biplane Vol: 29.9 ml 21.63 ml/m  AORTIC VALVE AV Area (Vmax):    1.70 cm AV Area (Vmean):   1.85 cm AV Area (VTI):     1.94 cm AV Vmax:           168.00 cm/s AV Vmean:          122.000 cm/s AV VTI:            0.319 m AV Peak Grad:      11.3 mmHg AV Mean Grad:      7.0 mmHg LVOT Vmax:         142.00 cm/s LVOT Vmean:        112.000 cm/s LVOT VTI:          0.308 m LVOT/AV VTI ratio: 0.97  AORTA Ao Root diam: 2.40 cm MITRAL VALVE MV Area (PHT): 4.86 cm     SHUNTS MV Decel Time: 156 msec  Systemic VTI:  0.31 m MV E velocity: 120.00 cm/s  Systemic Diam: 1.60 cm MV A velocity: 126.00 cm/s MV E/A ratio:  0.95 Kirk Ruths MD Electronically signed by Kirk Ruths MD Signature Date/Time: 07/25/2019/1:12:23 PM    Final      Discharge Exam: Vitals:   07/25/19 1215 07/25/19 1230  BP:    Pulse: 81 88  Resp: 14 14  Temp:    SpO2: 100% 96%   Vitals:   07/25/19 1145 07/25/19 1200 07/25/19 1215 07/25/19 1230  BP:      Pulse: 80 80 81 88  Resp: 13 14 14 14   Temp:      TempSrc:      SpO2: 100% 100% 100% 96%  Weight:      Height:        General: Pt is alert, awake, not in acute distress Cardiovascular: RRR, S1/S2 +, no rubs, no gallops Respiratory: CTA bilaterally,  no wheezing, no rhonchi, on 2L [place] Abdominal: Soft, NT, ND, bowel sounds + Extremities: no edema, no cyanosis    The results of significant diagnostics from this hospitalization (including imaging, microbiology, ancillary and laboratory) are listed below for reference.     Microbiology: Recent Results (from the past 240 hour(s))  Respiratory Panel by RT PCR (Flu A&B, Covid) - Nasopharyngeal Swab     Status: None   Collection Time: 07/25/19  3:30 AM   Specimen: Nasopharyngeal Swab  Result Value Ref Range Status   SARS Coronavirus 2 by RT PCR NEGATIVE NEGATIVE Final    Comment: (NOTE) SARS-CoV-2 target nucleic acids are NOT DETECTED. The SARS-CoV-2 RNA is generally detectable in upper respiratoy specimens during the acute phase of infection. The lowest concentration of SARS-CoV-2 viral copies this assay can detect is 131 copies/mL. A negative result does not preclude SARS-Cov-2 infection and should not be used as the sole basis for treatment or other patient management decisions. A negative result may occur with  improper specimen collection/handling, submission of specimen other than nasopharyngeal swab, presence of viral mutation(s) within the areas targeted by this assay, and inadequate number of viral copies (<131 copies/mL). A negative result must be combined with clinical observations, patient history, and epidemiological information. The expected result is Negative. Fact Sheet for Patients:  PinkCheek.be Fact Sheet for Healthcare Providers:  GravelBags.it This test is not yet ap proved or cleared by the [place] FDA and  has been authorized for detection and/or diagnosis of SARS-CoV-2 by FDA under an Emergency Use Authorization (EUA). This EUA will remain  in effect (meaning this test can be used) for the duration of the COVID-19 declaration under Section 564(b)(1) of the Act, 21 U.S.C. section 360bbb-3(b)(1),  unless the authorization is terminated or revoked sooner.    Influenza A by PCR NEGATIVE NEGATIVE Final   Influenza B by PCR NEGATIVE NEGATIVE Final    Comment: (NOTE) The Xpert Xpress SARS-CoV-2/FLU/RSV assay is intended as an aid in  the diagnosis of influenza from Nasopharyngeal swab specimens and  should not be used as a sole basis for treatment. Nasal washings and  aspirates are unacceptable for Xpert Xpress SARS-CoV-2/FLU/RSV  testing. Fact Sheet for Patients: PinkCheek.be Fact Sheet for Healthcare Providers: GravelBags.it This test is not yet approved or cleared by the [place] FDA and  has been authorized for detection and/or diagnosis of SARS-CoV-2 by  FDA under an Emergency Use Authorization (EUA). This EUA will remain  in effect (meaning this test can be used) for the duration of the  Covid-19 declaration under Section 564(b)(1) of the Act, 21  U.S.C. section 360bbb-3(b)(1), unless the authorization is  terminated or revoked. Performed at [facility], [place] [address] [place], [place] [address]      Labs: BNP (last 3 results) Recent Labs    07/25/19 0453  BNP 123456*   Basic Metabolic Panel: Recent Labs  Lab 07/24/19 2226 07/25/19 0601 07/25/19 0852  NA 138 136  --   K 4.0 6.2* 4.2  CL 105 101  --   CO2 24 28  --   GLUCOSE 169* 110*  --   BUN 7* 9  --   CREATININE 0.62 0.56  --   CALCIUM 7.8* 8.1*  --    Liver Function Tests: No results for input(s): AST, ALT, ALKPHOS, BILITOT, PROT, ALBUMIN in the last 168 hours. No results for input(s): LIPASE, AMYLASE in the last 168 hours. No results for input(s): AMMONIA in the last 168 hours. CBC: Recent Labs  Lab 07/24/19 2226 07/25/19 0601  WBC 20.9* 18.7*  HGB 8.8* 8.8*  HCT 31.2* 30.9*  MCV 87.2 86.3  PLT 507* 448*   Cardiac Enzymes: No results for input(s): CKTOTAL, CKMB, CKMBINDEX, TROPONINI in the last 168 hours. BNP: Invalid  input(s): POCBNP CBG: No results for input(s): GLUCAP in the last 168 hours. D-Dimer No results for input(s): DDIMER in the last 72 hours. Hgb A1c No results for input(s): HGBA1C in the last 72 hours. Lipid Profile No results for input(s): CHOL, HDL, LDLCALC, TRIG, CHOLHDL, LDLDIRECT in the last 72 hours. Thyroid function studies No results for input(s): TSH, T4TOTAL, T3FREE, THYROIDAB in the last 72 hours.  Invalid input(s): FREET3 Anemia work up No results for input(s): VITAMINB12, FOLATE, FERRITIN, TIBC, IRON, RETICCTPCT in the last 72 hours. Urinalysis No results found for: COLORURINE, APPEARANCEUR, [place], [place], [place], [place], [place], [place], PROTEINUR, UROBILINOGEN, NITRITE, LEUKOCYTESUR Sepsis Labs Invalid input(s): PROCALCITONIN,  WBC,  LACTICIDVEN Microbiology Recent Results (from the past 240 hour(s))  Respiratory Panel by RT PCR (Flu A&B, Covid) - Nasopharyngeal Swab     Status: None   Collection Time: 07/25/19  3:30 AM   Specimen: Nasopharyngeal Swab  Result Value Ref Range Status   SARS Coronavirus 2 by RT PCR NEGATIVE NEGATIVE Final    Comment: (NOTE) SARS-CoV-2 target nucleic acids are NOT DETECTED. The SARS-CoV-2 RNA is generally detectable in upper respiratoy specimens during the acute phase of infection. The lowest concentration of SARS-CoV-2 viral copies this assay can detect is 131 copies/mL. A negative result does not preclude SARS-Cov-2 infection and should not be used as the sole basis for treatment or other patient management decisions. A negative result may occur with  improper specimen collection/handling, submission of specimen other than nasopharyngeal swab, presence of viral mutation(s) within the areas targeted by this assay, and inadequate number of viral copies (<131 copies/mL). A negative result must be combined with clinical observations, patient history, and epidemiological information. The expected result is Negative. Fact  Sheet for Patients:  PinkCheek.be Fact Sheet for Healthcare Providers:  GravelBags.it This test is not yet ap proved or cleared by the [place] FDA and  has been authorized for detection and/or diagnosis of SARS-CoV-2 by FDA under an Emergency Use Authorization (EUA). This EUA will remain  in effect (meaning this test can be used) for the duration of the COVID-19 declaration under Section 564(b)(1) of the Act, 21 U.S.C. section 360bbb-3(b)(1), unless the authorization is terminated or revoked sooner.    Influenza A by PCR NEGATIVE  NEGATIVE Final   Influenza B by PCR NEGATIVE NEGATIVE Final    Comment: (NOTE) The Xpert Xpress SARS-CoV-2/FLU/RSV assay is intended as an aid in  the diagnosis of influenza from Nasopharyngeal swab specimens and  should not be used as a sole basis for treatment. Nasal washings and  aspirates are unacceptable for Xpert Xpress SARS-CoV-2/FLU/RSV  testing. Fact Sheet for Patients: PinkCheek.be Fact Sheet for Healthcare Providers: GravelBags.it This test is not yet approved or cleared by the [place] FDA and  has been authorized for detection and/or diagnosis of SARS-CoV-2 by  FDA under an Emergency Use Authorization (EUA). This EUA will remain  in effect (meaning this test can be used) for the duration of the  Covid-19 declaration under Section 564(b)(1) of the Act, 21  U.S.C. section 360bbb-3(b)(1), unless the authorization is  terminated or revoked. Performed at [facility], [place] [address], [place],  [address]      Time coordinating discharge: 35 minutes  SIGNED:   Rodena Goldmann, DO [facility] 07/25/2019, 2:25 PM  If 7PM-7AM, please contact night-coverage [email or web address]

## 2019-07-25 NOTE — ED Notes (Signed)
Pulse ox sat 96% RA while ambulating. Pt laid back in bed and pulse ox was 85% RA. Pt now 98%back on 2L.

## 2019-07-25 NOTE — ED Notes (Signed)
Patient verbalizes understanding of discharge instructions. Opportunity for questioning and answers were provided. Armband removed by staff, pt discharged from ED.  

## 2019-07-25 NOTE — ED Notes (Signed)
Patient in bed resting comfortably. Arousable. Attempted to removed supplemental 02 via [place]; noted Sp02 decreased, patient denies any shortness of breath when asked; patient placed patient back on supplemental o2 at 3 L via [place] and noted Sp02 improved to 96%

## 2019-07-25 NOTE — Progress Notes (Signed)
  Echocardiogram 2D Echocardiogram has been performed.  Desiree Barber A Desiree Barber 07/25/2019, 11:36 AM

## 2019-07-25 NOTE — ED Notes (Signed)
Lunch Tray Ordered 1115. 

## 2019-07-25 NOTE — ED Notes (Signed)
Pt received her oxygen tank And she is ready to go

## 2019-07-25 NOTE — H&P (Signed)
History and Physical    Desiree Barber [id number] DOB: 03-Dec-1950 DOA: 07/24/2019  PCP: Cory Munch, PA-C  Patient coming from: Home, outpt surgical center  I have personally briefly reviewed patient's old medical records in [place]  Chief Complaint: Hypoxia  HPI: Desiree Barber is a 69 y.o. female with medical history significant of COPD, HTN, anxiety, cervical disk disease.  Pt underwent ACDF with Dr. Christella Noa yesterday afternoon.  Post op pt with persistent O2 requirement initially.  Though slowly improving.  By time she was sent to ED only hypoxia with ambulation.  During procedure given Decadron, Versed, fentanyl, Toradol, rocuronium.  Unclear how much blood loss or fluid was given during surgery.   ED Course: CXR shows mild pulm edema, pt given 20mg  IV lasix.  HGB 8.8, but unknown baseline.  SOB has continued to improve, now only desats to 88 with ambulation, and is satting upper 90s on RA at rest.   Review of Systems: As per HPI, otherwise all review of systems negative.  Past Medical History:  Diagnosis Date  . Anxiety    Occasional  . Arthritis   . COPD (chronic obstructive pulmonary disease) ([place])   . HTN (hypertension)     Past Surgical History:  Procedure Laterality Date  . COLONOSCOPY  02/23/04   RMR: normal rectum and colon.minimal internal hemorrhoids  . COLONOSCOPY WITH PROPOFOL N/A 06/13/2015   Procedure: COLONOSCOPY WITH PROPOFOL;  Surgeon: Daneil Dolin, MD;  [facility];  Service: Endoscopy;  Laterality: N/A;  1045  . POLYPECTOMY  06/13/2015   Procedure: POLYPECTOMY;  Surgeon: Daneil Dolin, MD;  [facility];  Service: Endoscopy;;  sigmoid colon polyp  . TOOTH EXTRACTION    . TRIGGER FINGER RELEASE Bilateral   . TUBAL LIGATION       reports that she has been smoking cigarettes. She has a 15.00 pack-year smoking history. She has never used smokeless tobacco. She reports current alcohol use. She  reports that she does not use drugs.  Allergies  Allergen Reactions  . Demerol [Meperidine]   . Oxycontin [Oxycodone Hcl]   . Fish Allergy Nausea Only    Only fish from [place]    Family History  Problem Relation Age of Onset  . Colon cancer Neg Hx      Prior to Admission medications   Medication Sig Start Date End Date Taking? Authorizing Provider  lisinopril (PRINIVIL,ZESTRIL) 5 MG tablet Take 0.5 tablets by mouth daily. 04/24/18  Yes [provider]    Physical Exam: Vitals:   07/24/19 2154 07/25/19 0102 07/25/19 0423 07/25/19 0445  BP: (!) 104/37 129/61 136/70 114/64  Pulse: 87 88 87 85  Resp: 18 18 17    Temp: 97.7 F (36.5 C)     TempSrc: Oral     SpO2: 98% 98% 99% 97%  Weight:      Height:        Constitutional: NAD, calm, comfortable Eyes: PERRL, lids and conjunctivae normal ENMT: Mucous membranes are moist. Posterior pharynx clear of any exudate or lesions.Normal dentition.  Neck: normal, supple, no masses, no thyromegaly Respiratory: clear to auscultation bilaterally, no wheezing, no crackles. Normal respiratory effort. No accessory muscle use.  Cardiovascular: Regular rate and rhythm, no murmurs / rubs / gallops. No extremity edema. 2+ pedal pulses. No carotid bruits.  Abdomen: no tenderness, no masses palpated. No hepatosplenomegaly. Bowel sounds positive.  Musculoskeletal: no clubbing / cyanosis. No joint deformity upper and lower extremities.  Good ROM, no contractures. Normal muscle tone.  Skin: no rashes, lesions, ulcers. No induration Neurologic: CN 2-12 grossly intact. Sensation intact, DTR normal. Strength 5/5 in all 4.  Psychiatric: Normal judgment and insight. Alert and oriented x 3. Normal mood.    Labs on Admission: I have personally reviewed following labs and imaging studies  CBC: Recent Labs  Lab 07/24/19 2226  WBC 20.9*  HGB 8.8*  HCT 31.2*  MCV 87.2  PLT 0000000*   Basic Metabolic Panel: Recent Labs  Lab 07/24/19 2226    NA 138  K 4.0  CL 105  CO2 24  GLUCOSE 169*  BUN 7*  CREATININE 0.62  CALCIUM 7.8*   GFR: Estimated Creatinine Clearance: 44.3 mL/min (by C-G formula based on SCr of 0.62 mg/dL). Liver Function Tests: No results for input(s): AST, ALT, ALKPHOS, BILITOT, PROT, ALBUMIN in the last 168 hours. No results for input(s): LIPASE, AMYLASE in the last 168 hours. No results for input(s): AMMONIA in the last 168 hours. Coagulation Profile: No results for input(s): INR, PROTIME in the last 168 hours. Cardiac Enzymes: No results for input(s): CKTOTAL, CKMB, CKMBINDEX, TROPONINI in the last 168 hours. BNP (last 3 results) No results for input(s): PROBNP in the last 8760 hours. HbA1C: No results for input(s): HGBA1C in the last 72 hours. CBG: No results for input(s): GLUCAP in the last 168 hours. Lipid Profile: No results for input(s): CHOL, HDL, LDLCALC, TRIG, CHOLHDL, LDLDIRECT in the last 72 hours. Thyroid Function Tests: No results for input(s): TSH, T4TOTAL, FREET4, T3FREE, THYROIDAB in the last 72 hours. Anemia Panel: No results for input(s): VITAMINB12, FOLATE, FERRITIN, TIBC, IRON, RETICCTPCT in the last 72 hours. Urine analysis: No results found for: COLORURINE, APPEARANCEUR, [place], [place], GLUCOSEU, [place], BILIRUBINUR, KETONESUR, PROTEINUR, UROBILINOGEN, NITRITE, LEUKOCYTESUR  Radiological Exams on Admission: DG Chest 2 View  Result Date: 07/24/2019 CLINICAL DATA:  Postop hypoxia EXAM: CHEST - 2 VIEW COMPARISON:  04/21/2018 FINDINGS: Cardiac shadows within normal limits. The lungs are hyperinflated. Mild interstitial edema is seen with minimal posterior effusions. No focal infiltrate is seen. No bony abnormality is noted. Postsurgical changes are noted in the cervical spine. IMPRESSION: Mild interstitial edema with small effusions. Electronically Signed   By: Inez Catalina M.D.   On: 07/24/2019 23:24    EKG: Independently reviewed.  Assessment/Plan Principal Problem:   Acute  respiratory failure with hypoxia (HCC) Active Problems:   HTN (hypertension)   Post-operative complication   Anemia    1. Acute resp failure with hypoxia - 1. Seems to be fluid overload based on CXR showing mild pulm edema, improvement with lasix. 2. Repeat labs this AM pending, BNP pending 3. May want to consider another 20mg  IV lasix this morning 4. Unclear how much fluid she got in surgery yesterday or how much blood loss. 5. 2d echo ordered 6. Cont pulse ox 7. Tele monitor 2. Anemia - 1. Unclear baseline HGB 2. Repeat CBC pending this morning 3. May need further work up / anemia pnl depending on results 3. POD #1 ACDF - 1. PRN norco ordered for pain 2. May want to touch base with Dr. Christella Noa in AM 4. HTN - 1. Cont lisinopril  DVT prophylaxis: SCDs Code Status: Full Family Communication: No family in room Disposition Plan: Home after hypoxia resolved Consults called: None Admission status: Place in 66    Jathen Sudano, [place] [facility]  How to contact the [facility] Attending or Consulting provider [place] or covering provider during after  hours 7P -7A, for this patient?  1. Check the care team in [facility] and look for a) attending/consulting [facility] provider listed and b) the [facility] team listed 2. Log into [email or web address]  Amion Physician Scheduling and messaging for groups and whole hospitals  On call and physician scheduling software for group practices, residents, hospitalists and other medical providers for call, clinic, rotation and shift schedules. OnCall Enterprise is a hospital-wide system for scheduling doctors and paging doctors on call. EasyPlot is for scientific plotting and data analysis.  [email or web address]  and use [place]'s universal password to access. If you do not have the password, please contact the hospital operator.  3. Locate the [facility] provider you are looking for under [facility] and page to a number that you can be directly reached. 4. If you still  have difficulty reaching the provider, please page the [facility] (Director on Call) for the Hospitalists listed on amion for assistance.  07/25/2019, 5:22 AM

## 2019-07-25 NOTE — Care Management (Incomplete)
ED CM received consult for home oxygen arrangements and [facility] services recommendations, CM discussed with patient and she is agreeable. Discussed CMS for quality Has been set up with [employer] and [facility]

## 2019-07-25 NOTE — ED Notes (Signed)
SATURATION QUALIFICATIONS: (This note is used to comply with regulatory documentation for home oxygen)  Patient Saturations on Room Air at Rest = 86%  Patient Saturations on Room Air while Ambulating =83 %  Patient Saturations on 2 Liters of oxygen while Ambulating = 94%  Please briefly explain why patient needs home oxygen:

## 2019-07-25 NOTE — ED Notes (Signed)
Pt SpO2 decreased to 83% on RA while lying in bed. Pt SpO2 88-94% on RA while ambulating. Ambulated with steady gait and no discomfort. Placed pt on 2L [place] upon returning to bed, currently 99%.

## 2019-07-25 NOTE — ED Notes (Signed)
Patient removed oxygen to eat. Education provided on the importance of keep [place] on to promote circulation

## 2019-07-28 DIAGNOSIS — J9601 Acute respiratory failure with hypoxia: Secondary | ICD-10-CM | POA: Diagnosis not present

## 2019-08-13 DIAGNOSIS — I10 Essential (primary) hypertension: Secondary | ICD-10-CM | POA: Diagnosis not present

## 2019-08-13 DIAGNOSIS — M4722 Other spondylosis with radiculopathy, cervical region: Secondary | ICD-10-CM | POA: Diagnosis not present

## 2019-08-20 DIAGNOSIS — D649 Anemia, unspecified: Secondary | ICD-10-CM | POA: Diagnosis not present

## 2019-08-20 DIAGNOSIS — R5383 Other fatigue: Secondary | ICD-10-CM | POA: Diagnosis not present

## 2019-08-20 DIAGNOSIS — F419 Anxiety disorder, unspecified: Secondary | ICD-10-CM | POA: Diagnosis not present

## 2019-08-20 DIAGNOSIS — J449 Chronic obstructive pulmonary disease, unspecified: Secondary | ICD-10-CM | POA: Diagnosis not present

## 2019-08-20 DIAGNOSIS — Z681 Body mass index (BMI) 19 or less, adult: Secondary | ICD-10-CM | POA: Diagnosis not present

## 2019-08-24 DIAGNOSIS — J449 Chronic obstructive pulmonary disease, unspecified: Secondary | ICD-10-CM | POA: Diagnosis not present

## 2019-08-24 DIAGNOSIS — I10 Essential (primary) hypertension: Secondary | ICD-10-CM | POA: Diagnosis not present

## 2019-08-24 DIAGNOSIS — Z720 Tobacco use: Secondary | ICD-10-CM | POA: Diagnosis not present

## 2019-10-16 DIAGNOSIS — Z1211 Encounter for screening for malignant neoplasm of colon: Secondary | ICD-10-CM | POA: Diagnosis not present

## 2019-10-21 ENCOUNTER — Other Ambulatory Visit (HOSPITAL_COMMUNITY): Payer: Self-pay | Admitting: Physician Assistant

## 2019-10-21 DIAGNOSIS — Z1231 Encounter for screening mammogram for malignant neoplasm of breast: Secondary | ICD-10-CM

## 2019-10-21 DIAGNOSIS — Z1389 Encounter for screening for other disorder: Secondary | ICD-10-CM | POA: Diagnosis not present

## 2019-10-21 DIAGNOSIS — F419 Anxiety disorder, unspecified: Secondary | ICD-10-CM | POA: Diagnosis not present

## 2019-10-21 DIAGNOSIS — Z0000 Encounter for general adult medical examination without abnormal findings: Secondary | ICD-10-CM | POA: Diagnosis not present

## 2019-10-21 DIAGNOSIS — Z681 Body mass index (BMI) 19 or less, adult: Secondary | ICD-10-CM | POA: Diagnosis not present

## 2019-10-21 DIAGNOSIS — D509 Iron deficiency anemia, unspecified: Secondary | ICD-10-CM | POA: Diagnosis not present

## 2019-10-23 DIAGNOSIS — J449 Chronic obstructive pulmonary disease, unspecified: Secondary | ICD-10-CM | POA: Diagnosis not present

## 2019-10-23 DIAGNOSIS — I10 Essential (primary) hypertension: Secondary | ICD-10-CM | POA: Diagnosis not present

## 2019-10-23 DIAGNOSIS — Z720 Tobacco use: Secondary | ICD-10-CM | POA: Diagnosis not present

## 2019-10-26 ENCOUNTER — Other Ambulatory Visit (HOSPITAL_COMMUNITY): Payer: Self-pay | Admitting: Physician Assistant

## 2019-10-26 DIAGNOSIS — R928 Other abnormal and inconclusive findings on diagnostic imaging of breast: Secondary | ICD-10-CM

## 2019-10-27 ENCOUNTER — Ambulatory Visit (HOSPITAL_COMMUNITY)
Admission: RE | Admit: 2019-10-27 | Discharge: 2019-10-27 | Disposition: A | Discharge status: Home / Self Care | Payer: Medicare Other | Source: Ambulatory Visit | Attending: Physician Assistant | Admitting: Physician Assistant

## 2019-10-27 ENCOUNTER — Other Ambulatory Visit: Payer: Self-pay

## 2019-10-27 DIAGNOSIS — R928 Other abnormal and inconclusive findings on diagnostic imaging of breast: Secondary | ICD-10-CM | POA: Diagnosis not present

## 2019-10-27 DIAGNOSIS — N6312 Unspecified lump in the right breast, upper inner quadrant: Secondary | ICD-10-CM | POA: Diagnosis not present

## 2019-10-27 DIAGNOSIS — R922 Inconclusive mammogram: Secondary | ICD-10-CM | POA: Diagnosis not present

## 2019-10-28 ENCOUNTER — Ambulatory Visit (HOSPITAL_COMMUNITY): Payer: Medicare Other

## 2019-12-24 DIAGNOSIS — J449 Chronic obstructive pulmonary disease, unspecified: Secondary | ICD-10-CM | POA: Diagnosis not present

## 2019-12-24 DIAGNOSIS — Z720 Tobacco use: Secondary | ICD-10-CM | POA: Diagnosis not present

## 2019-12-24 DIAGNOSIS — I10 Essential (primary) hypertension: Secondary | ICD-10-CM | POA: Diagnosis not present

## 2019-12-28 DIAGNOSIS — Z23 Encounter for immunization: Secondary | ICD-10-CM | POA: Diagnosis not present

## 2019-12-30 DIAGNOSIS — N390 Urinary tract infection, site not specified: Secondary | ICD-10-CM | POA: Diagnosis not present

## 2020-02-23 DIAGNOSIS — J449 Chronic obstructive pulmonary disease, unspecified: Secondary | ICD-10-CM | POA: Diagnosis not present

## 2020-02-23 DIAGNOSIS — I10 Essential (primary) hypertension: Secondary | ICD-10-CM | POA: Diagnosis not present

## 2020-02-23 DIAGNOSIS — Z720 Tobacco use: Secondary | ICD-10-CM | POA: Diagnosis not present

## 2020-06-22 DIAGNOSIS — I10 Essential (primary) hypertension: Secondary | ICD-10-CM | POA: Diagnosis not present

## 2020-06-22 DIAGNOSIS — Z720 Tobacco use: Secondary | ICD-10-CM | POA: Diagnosis not present

## 2020-06-22 DIAGNOSIS — J449 Chronic obstructive pulmonary disease, unspecified: Secondary | ICD-10-CM | POA: Diagnosis not present

## 2020-07-11 DIAGNOSIS — N342 Other urethritis: Secondary | ICD-10-CM | POA: Diagnosis not present

## 2020-07-11 DIAGNOSIS — Z1331 Encounter for screening for depression: Secondary | ICD-10-CM | POA: Diagnosis not present

## 2020-07-11 DIAGNOSIS — Z681 Body mass index (BMI) 19 or less, adult: Secondary | ICD-10-CM | POA: Diagnosis not present

## 2020-07-11 DIAGNOSIS — F17200 Nicotine dependence, unspecified, uncomplicated: Secondary | ICD-10-CM | POA: Diagnosis not present

## 2020-07-11 DIAGNOSIS — R319 Hematuria, unspecified: Secondary | ICD-10-CM | POA: Diagnosis not present

## 2020-07-23 DIAGNOSIS — Z720 Tobacco use: Secondary | ICD-10-CM | POA: Diagnosis not present

## 2020-07-23 DIAGNOSIS — I10 Essential (primary) hypertension: Secondary | ICD-10-CM | POA: Diagnosis not present

## 2020-07-23 DIAGNOSIS — J449 Chronic obstructive pulmonary disease, unspecified: Secondary | ICD-10-CM | POA: Diagnosis not present

## 2020-07-25 DIAGNOSIS — R319 Hematuria, unspecified: Secondary | ICD-10-CM | POA: Diagnosis not present

## 2020-07-25 DIAGNOSIS — N390 Urinary tract infection, site not specified: Secondary | ICD-10-CM | POA: Diagnosis not present

## 2020-07-25 DIAGNOSIS — F17200 Nicotine dependence, unspecified, uncomplicated: Secondary | ICD-10-CM | POA: Diagnosis not present

## 2020-08-02 IMAGING — DX DG CHEST 2V
2 series · 2 of 2 positions shown · non-contrast
Comparison: PA and lateral chest 08/11/2013.

CLINICAL DATA: Cough and intermittent headaches for 3 days scratch
the cough and intermittent headaches for 3 weeks.

EXAM:
CHEST - 2 VIEW

[chest pa]
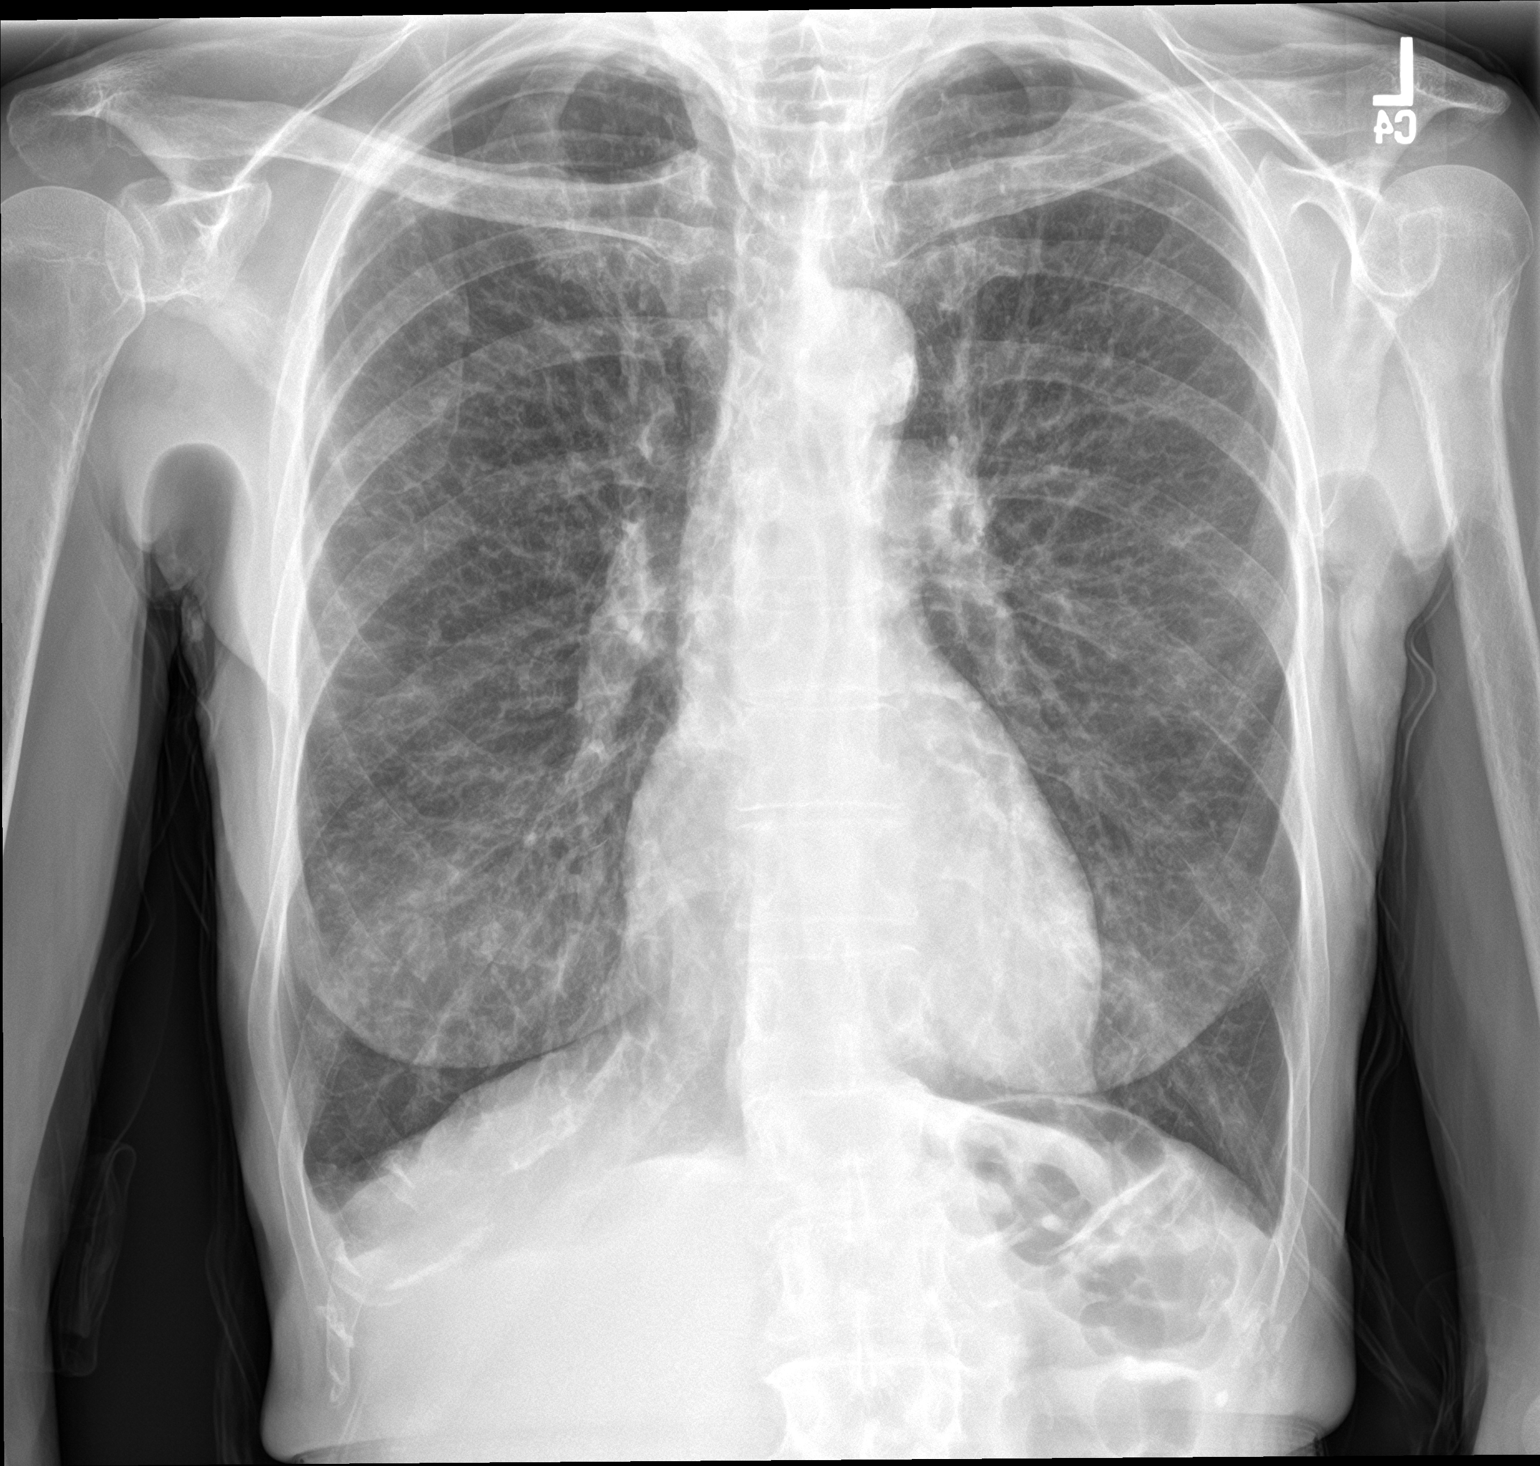

[chest lat]
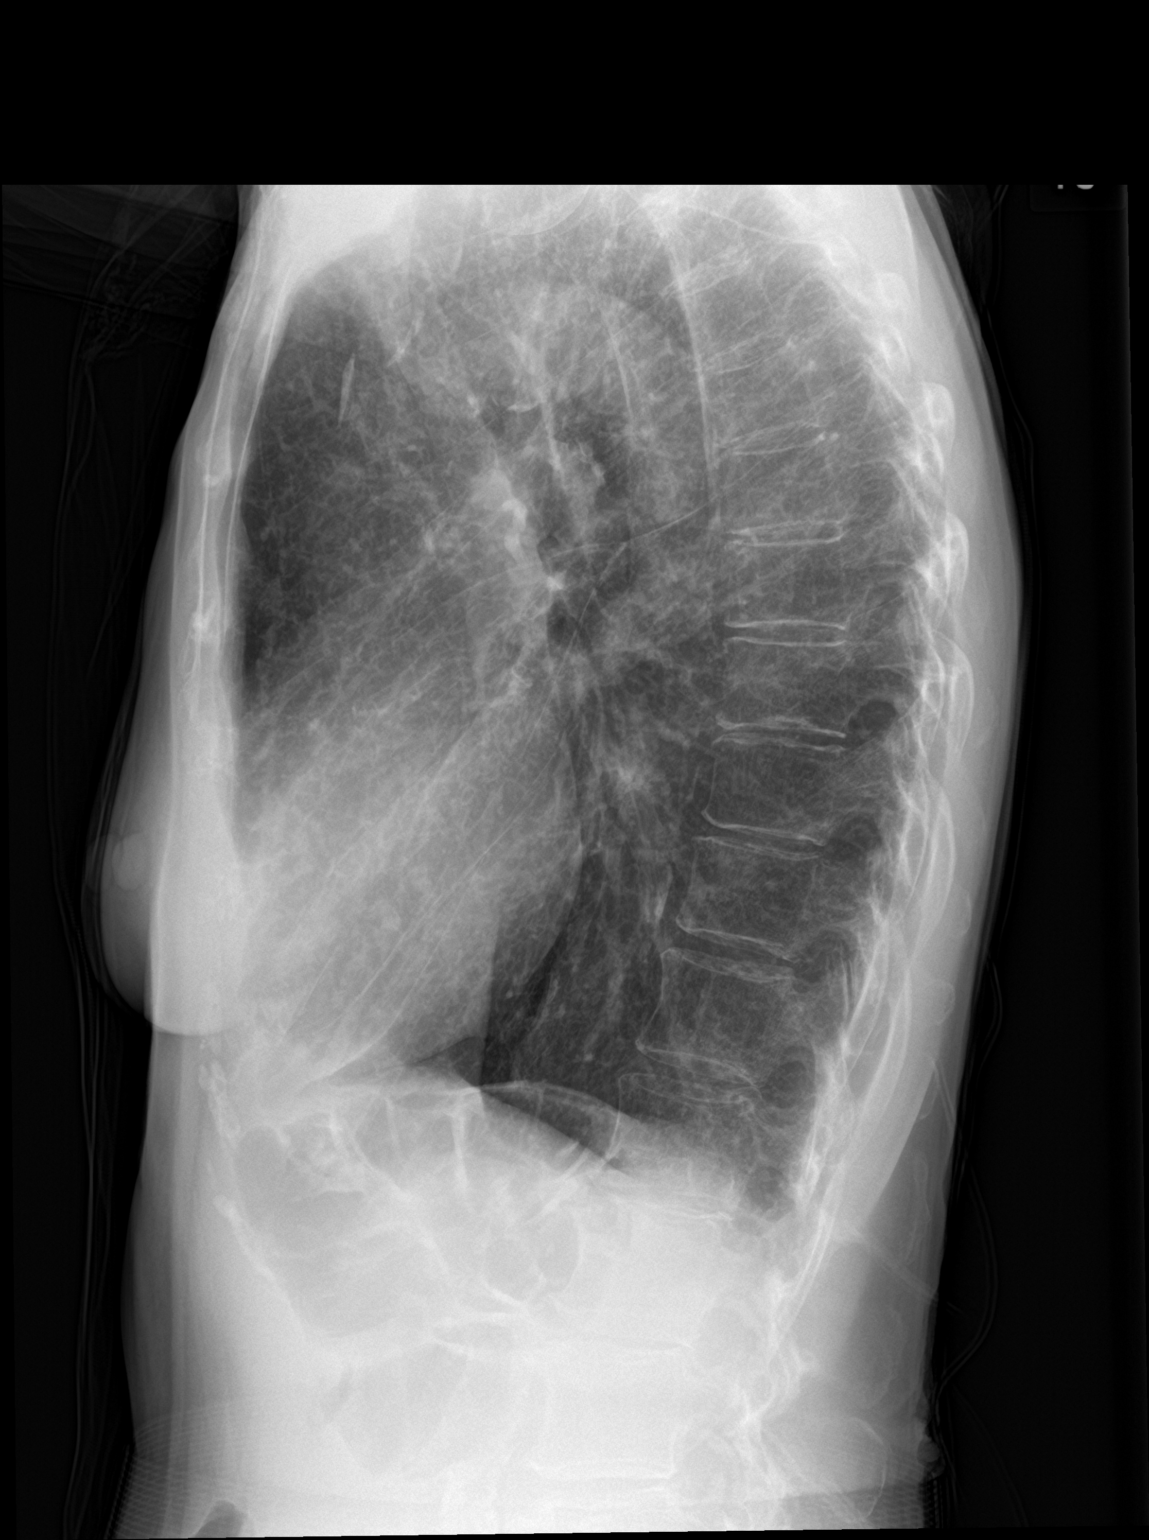

[2 of 2 positions shown; findings below may reference images not displayed]

FINDINGS: The chest is hyperexpanded and there is peribronchial thickening. No
consolidative process, pneumothorax or effusion. Mild blunting at
the right costophrenic angle is unchanged and likely due to scar.
Heart size is normal. Aortic atherosclerosis is noted.
IMPRESSION: Findings consistent with COPD.

No acute disease.

## 2020-08-09 ENCOUNTER — Encounter (HOSPITAL_COMMUNITY): Payer: Self-pay | Admitting: *Deleted

## 2020-08-09 ENCOUNTER — Emergency Department (HOSPITAL_COMMUNITY): Payer: Medicare Other

## 2020-08-09 ENCOUNTER — Emergency Department (HOSPITAL_COMMUNITY)
Admission: EM | Admit: 2020-08-09 | Discharge: 2020-08-09 | Disposition: A | Discharge status: Home / Self Care | Payer: Medicare Other | Attending: Emergency Medicine | Admitting: Emergency Medicine

## 2020-08-09 ENCOUNTER — Other Ambulatory Visit: Payer: Self-pay

## 2020-08-09 DIAGNOSIS — F17210 Nicotine dependence, cigarettes, uncomplicated: Secondary | ICD-10-CM | POA: Diagnosis not present

## 2020-08-09 DIAGNOSIS — Z79899 Other long term (current) drug therapy: Secondary | ICD-10-CM | POA: Insufficient documentation

## 2020-08-09 DIAGNOSIS — G319 Degenerative disease of nervous system, unspecified: Secondary | ICD-10-CM | POA: Diagnosis not present

## 2020-08-09 DIAGNOSIS — R0602 Shortness of breath: Secondary | ICD-10-CM | POA: Insufficient documentation

## 2020-08-09 DIAGNOSIS — J449 Chronic obstructive pulmonary disease, unspecified: Secondary | ICD-10-CM | POA: Diagnosis not present

## 2020-08-09 DIAGNOSIS — E860 Dehydration: Secondary | ICD-10-CM | POA: Insufficient documentation

## 2020-08-09 DIAGNOSIS — J019 Acute sinusitis, unspecified: Secondary | ICD-10-CM | POA: Diagnosis not present

## 2020-08-09 DIAGNOSIS — I10 Essential (primary) hypertension: Secondary | ICD-10-CM | POA: Diagnosis not present

## 2020-08-09 DIAGNOSIS — H538 Other visual disturbances: Secondary | ICD-10-CM | POA: Insufficient documentation

## 2020-08-09 DIAGNOSIS — R42 Dizziness and giddiness: Secondary | ICD-10-CM | POA: Diagnosis not present

## 2020-08-09 LAB — BASIC METABOLIC PANEL
Anion gap: 7 (ref 5–15)
BUN: 9 mg/dL (ref 8–23)
CO2: 32 mmol/L (ref 22–32)
Calcium: 9 mg/dL (ref 8.9–10.3)
Chloride: 97 mmol/L — ABNORMAL LOW (ref 98–111)
Creatinine, Ser: 0.54 mg/dL (ref 0.44–1.00)
GFR, Estimated: >60 mL/min (ref >60)
Glucose, Bld: 91 mg/dL (ref 70–99)
Potassium: 4.3 mmol/L (ref 3.5–5.1)
Sodium: 136 mmol/L (ref 135–145)

## 2020-08-09 LAB — URINALYSIS, ROUTINE W REFLEX MICROSCOPIC
Bilirubin Urine: NEGATIVE
Glucose, UA: NEGATIVE mg/dL
Hgb urine dipstick: NEGATIVE
Ketones, ur: NEGATIVE mg/dL
Leukocytes,Ua: NEGATIVE
Nitrite: NEGATIVE
Protein, ur: NEGATIVE mg/dL
Specific Gravity, Urine: 1.006 (ref 1.005–1.030)
pH: 7 (ref 5.0–8.0)

## 2020-08-09 LAB — CBC
HCT: 50.2 % — ABNORMAL HIGH (ref 36.0–46.0)
Hemoglobin: 15.9 g/dL — ABNORMAL HIGH (ref 12.0–15.0)
MCH: 33 pg (ref 26.0–34.0)
MCHC: 31.7 g/dL (ref 30.0–36.0)
MCV: 104.1 fL — ABNORMAL HIGH (ref 80.0–100.0)
Platelets: 356 10*3/uL (ref 150–400)
RBC: 4.82 MIL/uL (ref 3.87–5.11)
RDW: 12.6 % (ref 11.5–15.5)
WBC: 9.6 10*3/uL (ref 4.0–10.5)
nRBC: 0 % (ref 0.0–0.2)

## 2020-08-09 MED ORDER — SODIUM CHLORIDE 0.9 % IV BOLUS
1000.0000 mL | Freq: Once | INTRAVENOUS | Status: AC
  Administered 2020-08-09: 1000 mL via INTRAVENOUS

## 2020-08-09 NOTE — ED Notes (Signed)
Patient to MRI at this time.

## 2020-08-09 NOTE — ED Triage Notes (Signed)
C/O DIZZINESS FOR 3 DAYS

## 2020-08-09 NOTE — Discharge Instructions (Addendum)
I feel you are likely lightheaded due to dehydration and a lack of fluid intake and food intake.  Please work to increase the amount of water that you are drinking daily.  I recommend more than 64 ounces of water in a day.  You can also consider trying electrolyte replacement beverages such as Gatorade or Powerade.  I would also recommend that you follow-up with your optometrist regarding the blurry vision in the left eye.  Please continue to monitor your symptoms closely.  If they persist, please follow-up with your regular doctor.  If they worsen in any way, please come back to the emergency department for reevaluation.  It was a pleasure to meet you.

## 2020-08-09 NOTE — ED Provider Notes (Addendum)
[facility] Provider Note   CSN: [id number] Arrival date & time: 08/09/20  1157     History Chief Complaint  Patient presents with  . Dizziness    Desiree Barber is a 70 y.o. female.  HPI Patient is a 70 year old female with a history of anxiety, COPD, hypertension, who presents to the emergency department due to lightheadedness.  She states her symptoms started about 3 days ago.  States her symptoms worsen when going from a sitting to a standing position.  Denies any episodes of syncope, falls, or head trauma.  She reports associated blurry vision in the left eye.  She states she can still see but "feels like she has wax paper on the eye".  This started at about the same time she became lightheaded.  She states she has had decreased appetite but this appears to be her baseline.  She reports chronic shortness of breath that has not acutely worsened.  No chest pain, nausea, vomiting, diarrhea, abdominal pain.  She smokes about 1 pack/day.    Past Medical History:  Diagnosis Date  . Anxiety    Occasional  . Arthritis   . COPD (chronic obstructive pulmonary disease) ([place])   . HTN (hypertension)     Patient Active Problem List   Diagnosis Date Noted  . Acute respiratory failure with hypoxia ([place]) 07/25/2019  . HTN (hypertension) 07/25/2019  . Post-operative complication 17/61/6073  . Anemia 07/25/2019  . History of colonic polyps   . Diverticulosis of colon without hemorrhage   . Encounter for screening colonoscopy 05/18/2015    Past Surgical History:  Procedure Laterality Date  . COLONOSCOPY  02/23/04   RMR: normal rectum and colon.minimal internal hemorrhoids  . COLONOSCOPY WITH PROPOFOL N/A 06/13/2015   Procedure: COLONOSCOPY WITH PROPOFOL;  Surgeon: Daneil Dolin, MD;  [facility];  Service: Endoscopy;  Laterality: N/A;  1045  . POLYPECTOMY  06/13/2015   Procedure: POLYPECTOMY;  Surgeon: Daneil Dolin, MD;  [facility];   Service: Endoscopy;;  sigmoid colon polyp  . TOOTH EXTRACTION    . TRIGGER FINGER RELEASE Bilateral   . TUBAL LIGATION       OB History   No obstetric history on file.     Family History  Problem Relation Age of Onset  . Colon cancer Neg Hx     Social History   Tobacco Use  . Smoking status: Current Every Day Smoker    Packs/day: 0.50    Years: 30.00    Pack years: 15.00    Types: Cigarettes  . Smokeless tobacco: Never Used  Substance Use Topics  . Alcohol use: Yes    Alcohol/week: 0.0 standard drinks    Comment: 3 beers a day  . Drug use: No    Home Medications Prior to Admission medications   Medication Sig Start Date End Date Taking? Authorizing Provider  Ascorbic Acid (VITAMIN C) 100 MG tablet Take 100 mg by mouth daily.   Yes [provider]  lisinopril (PRINIVIL,ZESTRIL) 5 MG tablet Take 0.5 tablets by mouth daily. 04/24/18  Yes [provider]  umeclidinium bromide (INCRUSE ELLIPTA) 62.5 MCG/INH AEPB Inhale 1 puff into the lungs daily.   Yes [provider]  vitamin B-12 (CYANOCOBALAMIN) 100 MCG tablet Take 100 mcg by mouth daily.   Yes [provider]  furosemide (LASIX) 20 MG tablet Take 1 tablet (20 mg total) by mouth daily as needed for fluid or edema (shortness of breath).  07/25/19 07/24/20  Heath Lark D, DO    Allergies    Demerol [meperidine], Oxycontin [oxycodone hcl], and Fish allergy  Review of Systems   Review of Systems  All other systems reviewed and are negative. Ten systems reviewed and are negative for acute change, except as noted in the HPI.   Physical Exam Updated Vital Signs BP (!) 161/80   Pulse 72   Temp 98.2 F (36.8 C) (Oral)   Resp 18   Ht 5' 4.5" (1.638 m)   Wt 43.1 kg   SpO2 93%   BMI 16.05 kg/m   Physical Exam Vitals and nursing note reviewed.  Constitutional:      General: She is not in acute distress.    Appearance: Normal appearance. She is not ill-appearing, toxic-appearing or  diaphoretic.  HENT:     Head: Normocephalic and atraumatic.     Right Ear: External ear normal.     Left Ear: External ear normal.     Nose: Nose normal.     Mouth/Throat:     Mouth: Mucous membranes are moist.     Pharynx: Oropharynx is clear. No oropharyngeal exudate or posterior oropharyngeal erythema.  Eyes:     General: No scleral icterus.       Right eye: No discharge.        Left eye: No discharge.     Extraocular Movements: Extraocular movements intact.     Conjunctiva/sclera: Conjunctivae normal.     Pupils: Pupils are equal, round, and reactive to light.     Comments: EOMI. PERRL.  Visual fields intact in both eyes.  Cardiovascular:     Rate and Rhythm: Normal rate and regular rhythm.     Pulses: Normal pulses.     Heart sounds: Normal heart sounds. No murmur heard. No friction rub. No gallop.   Pulmonary:     Effort: Pulmonary effort is normal. No respiratory distress.     Breath sounds: Normal breath sounds. No stridor. No wheezing, rhonchi or rales.  Abdominal:     General: Abdomen is flat.     Tenderness: There is no abdominal tenderness.  Musculoskeletal:        General: Normal range of motion.     Cervical back: Normal range of motion and neck supple. No tenderness.  Skin:    General: Skin is warm and dry.  Neurological:     General: No focal deficit present.     Mental Status: She is alert and oriented to person, place, and time.     Comments: Patient is oriented to person, place, and time. Patient phonates in clear, complete, and coherent sentences. Strength is 5/5 in all four extremities. Distal sensation intact in all four extremities.  Psychiatric:        Mood and Affect: Mood normal.        Behavior: Behavior normal.    ED Results / Procedures / Treatments   Labs (all labs ordered are listed, but only abnormal results are displayed) Labs Reviewed  BASIC METABOLIC PANEL - Abnormal; Notable for the following components:      Result Value   Chloride  97 (*)    All other components within normal limits  CBC - Abnormal; Notable for the following components:   Hemoglobin 15.9 (*)    HCT 50.2 (*)    MCV 104.1 (*)    All other components within normal limits  URINALYSIS, ROUTINE W REFLEX MICROSCOPIC    EKG EKG Interpretation  Date/Time:  Tuesday  Aug 09 2020 12:30:07 EDT Ventricular Rate:  83 PR Interval:  146 QRS Duration: 96 QT Interval:  388 QTC Calculation: 455 R Axis:   18 Text Interpretation: Normal sinus rhythm Right atrial enlargement Incomplete right bundle branch block Cannot rule out Anterior infarct , age undetermined Abnormal ECG No significant change since last tracing 5/21 Confirmed by Aletta Edouard [phone]) on 08/09/2020 12:40:41 PM  Radiology MR BRAIN WO CONTRAST  Result Date: 08/09/2020 CLINICAL DATA:  Vision loss, monocular; blurry vision in left eye with lightheadedness for 3 days. Additional history provided: Dizziness for 3 days, cough. EXAM: MRI HEAD WITHOUT CONTRAST TECHNIQUE: Multiplanar, multiecho pulse sequences of the brain and surrounding structures were obtained without intravenous contrast. COMPARISON:  Cervical spine radiographs 08/13/2019. FINDINGS: Brain: Intermittently motion degraded examination. Most notably, there is moderate to moderately severe motion degradation of the sagittal T1 weighted sequence. No more than mild motion degradation of the remaining acquired sequences. Mild cerebral and cerebellar atrophy. Mild-to-moderate multifocal T2/FLAIR hyperintensity within the cerebral white matter and pons, nonspecific but compatible with chronic small vessel ischemic disease. There is no acute infarct. No evidence of intracranial mass. No chronic intracranial blood products. No extra-axial fluid collection. No midline shift. Vascular: Expected proximal arterial flow voids. Skull and upper cervical spine: Within the limitations of motion degradation, no focal suspicious marrow lesion is identified.  Sinuses/Orbits: Visualized orbits show no acute finding. Small right maxillary sinus mucous retention cyst moderate-sized left maxillary sinus mucous retention cyst. Trace bilateral ethmoid sinus mucosal thickening. Other: Trace fluid within the bilateral mastoid air cells. IMPRESSION: Intermittently motion degraded examination, as described. No evidence of acute intracranial abnormality. Mild-to-moderate chronic small vessel ischemic changes within cerebral white matter and pons. Mild generalized parenchymal atrophy. Paranasal sinus disease, as described. Trace fluid within the bilateral mastoid air cells. Electronically Signed   By: Kellie Simmering DO   On: 08/09/2020 14:35   DG Chest Portable 1 View  Result Date: 08/09/2020 CLINICAL DATA:  Dizziness.  Hypertension. EXAM: PORTABLE CHEST 1 VIEW COMPARISON:  July 24, 2019 FINDINGS: Lungs are hyperexpanded. There is diffuse interstitial thickening throughout the lungs. No edema or airspace opacity. Heart size and pulmonary vascularity are normal. No adenopathy. There is aortic atherosclerosis. There is evidence of an old healed fracture of the left clavicle. There is postoperative change in the lower cervical spine. IMPRESSION: Lungs hyperexpanded. Diffuse interstitial thickening, likely due to chronic bronchitis. No edema or airspace opacity. Heart size normal. Aortic Atherosclerosis (ICD10-I70.0). Electronically Signed   By: Lowella Grip III M.D.   On: 08/09/2020 14:17    Procedures Procedures   Medications Ordered in ED Medications  sodium chloride 0.9 % bolus 1,000 mL (0 mLs Intravenous Stopped 08/09/20 1510)    ED Course  I have reviewed the triage vital signs and the nursing notes.  Pertinent labs & imaging results that were available during my care of the patient were reviewed by me and considered in my medical decision making (see chart for details).  Clinical Course as of 08/09/20 1639  Tue Aug 09, 2020  1435 Hemoglobin(!): 15.9 [LJ]   1435 HCT(!): 50.2 [LJ]  1444 She is here with some dizziness lightheadedness that is been going on for the last few days.  She said she does not eat or drink much anyways but does not feel this is changed.  No falls or head trauma.  Getting labs EKG chest x-ray and likely MRI brain.  If all testing negative and she is symptomatically improved likely  outpatient follow-up [MB]    Clinical Course User Index [LJ] Rayna Sexton, PA-C [MB] Hayden Rasmussen, MD   MDM Rules/Calculators/A&P                          Pt is a 70 y.o. female who presents to the ED due to lightheadedness and blurry vision in the left eye.   Labs: CBC with elevated hemoglobin of 15.9, hematocrit of 50.2, MCV of 104.1. BMP with a chloride of 97. UA is negative.  Imaging: Chest x-ray shows hyperexpanded lungs with diffuse interstitial thickening likely due to chronic bronchitis.  No edema or airspace opacities.  No cardiomegaly. MRI of the brain shows no evidence of acute intracranial abnormality.  I, Rayna Sexton, PA-C, personally reviewed and evaluated these images and lab results as part of my medical decision-making.  Unsure of the source of the patient's symptoms.  She has an elevated hemoglobin and hematocrit which is likely due to hemoconcentration.  Patient endorses poor p.o. intake.  Discussed increasing her fluid intake.  She was given a liter of IV fluids and notes significant improvement in her symptoms.  She has ambulated in the emergency department without difficulty.  Feel that patient is stable for discharge at this time and she is agreeable.  Recommended patient continue to monitor her symptoms closely.  If they worsen, she understands that she needs to come back to the emergency department immediately for reevaluation.  She is going to f/u with her PCP.  Her questions were answered and she was amicable at the time of discharge.  Note: Portions of this report may have been transcribed using voice  recognition software. Every effort was made to ensure accuracy; however, inadvertent computerized transcription errors may be present.   Final Clinical Impression(s) / ED Diagnoses Final diagnoses:  Lightheadedness  Dehydration   Rx / DC Orders ED Discharge Orders    None       Rayna Sexton, PA-C 08/09/20 1446    Rayna Sexton, PA-C 08/09/20 1700    Hayden Rasmussen, MD 08/09/20 1827

## 2020-08-15 DIAGNOSIS — J449 Chronic obstructive pulmonary disease, unspecified: Secondary | ICD-10-CM | POA: Diagnosis not present

## 2020-08-15 DIAGNOSIS — F419 Anxiety disorder, unspecified: Secondary | ICD-10-CM | POA: Diagnosis not present

## 2020-08-15 DIAGNOSIS — Z681 Body mass index (BMI) 19 or less, adult: Secondary | ICD-10-CM | POA: Diagnosis not present

## 2020-08-15 DIAGNOSIS — R42 Dizziness and giddiness: Secondary | ICD-10-CM | POA: Diagnosis not present

## 2020-09-22 DIAGNOSIS — I10 Essential (primary) hypertension: Secondary | ICD-10-CM | POA: Diagnosis not present

## 2020-09-22 DIAGNOSIS — J449 Chronic obstructive pulmonary disease, unspecified: Secondary | ICD-10-CM | POA: Diagnosis not present

## 2020-09-30 DIAGNOSIS — Z20822 Contact with and (suspected) exposure to covid-19: Secondary | ICD-10-CM | POA: Diagnosis not present

## 2020-10-19 DIAGNOSIS — U071 COVID-19: Secondary | ICD-10-CM | POA: Diagnosis not present

## 2020-10-19 DIAGNOSIS — Z23 Encounter for immunization: Secondary | ICD-10-CM | POA: Diagnosis not present

## 2020-11-15 DIAGNOSIS — Z0000 Encounter for general adult medical examination without abnormal findings: Secondary | ICD-10-CM | POA: Diagnosis not present

## 2020-11-15 DIAGNOSIS — Z681 Body mass index (BMI) 19 or less, adult: Secondary | ICD-10-CM | POA: Diagnosis not present

## 2020-11-15 DIAGNOSIS — Z1331 Encounter for screening for depression: Secondary | ICD-10-CM | POA: Diagnosis not present

## 2020-11-21 DIAGNOSIS — Z0000 Encounter for general adult medical examination without abnormal findings: Secondary | ICD-10-CM | POA: Diagnosis not present

## 2020-11-21 DIAGNOSIS — Z681 Body mass index (BMI) 19 or less, adult: Secondary | ICD-10-CM | POA: Diagnosis not present

## 2020-11-21 DIAGNOSIS — Z1389 Encounter for screening for other disorder: Secondary | ICD-10-CM | POA: Diagnosis not present

## 2021-02-21 DIAGNOSIS — R7981 Abnormal blood-gas level: Secondary | ICD-10-CM | POA: Diagnosis not present

## 2021-02-21 DIAGNOSIS — F419 Anxiety disorder, unspecified: Secondary | ICD-10-CM | POA: Diagnosis not present

## 2021-02-21 DIAGNOSIS — Z681 Body mass index (BMI) 19 or less, adult: Secondary | ICD-10-CM | POA: Diagnosis not present

## 2021-02-21 DIAGNOSIS — Z23 Encounter for immunization: Secondary | ICD-10-CM | POA: Diagnosis not present

## 2021-02-21 DIAGNOSIS — J449 Chronic obstructive pulmonary disease, unspecified: Secondary | ICD-10-CM | POA: Diagnosis not present

## 2021-03-28 DIAGNOSIS — Z23 Encounter for immunization: Secondary | ICD-10-CM | POA: Diagnosis not present

## 2021-04-03 DIAGNOSIS — Z20822 Contact with and (suspected) exposure to covid-19: Secondary | ICD-10-CM | POA: Diagnosis not present

## 2021-05-25 DIAGNOSIS — Z20822 Contact with and (suspected) exposure to covid-19: Secondary | ICD-10-CM | POA: Diagnosis not present

## 2021-05-29 DIAGNOSIS — J449 Chronic obstructive pulmonary disease, unspecified: Secondary | ICD-10-CM | POA: Diagnosis not present

## 2021-05-29 DIAGNOSIS — R7981 Abnormal blood-gas level: Secondary | ICD-10-CM | POA: Diagnosis not present

## 2021-05-29 DIAGNOSIS — Z681 Body mass index (BMI) 19 or less, adult: Secondary | ICD-10-CM | POA: Diagnosis not present

## 2021-05-29 DIAGNOSIS — J9611 Chronic respiratory failure with hypoxia: Secondary | ICD-10-CM | POA: Diagnosis not present

## 2021-06-13 ENCOUNTER — Other Ambulatory Visit: Payer: Self-pay

## 2021-06-13 ENCOUNTER — Ambulatory Visit (INDEPENDENT_AMBULATORY_CARE_PROVIDER_SITE_OTHER): Payer: Medicare Other | Admitting: Internal Medicine

## 2021-06-13 ENCOUNTER — Encounter: Payer: Self-pay | Admitting: Internal Medicine

## 2021-06-13 DIAGNOSIS — J449 Chronic obstructive pulmonary disease, unspecified: Secondary | ICD-10-CM | POA: Diagnosis not present

## 2021-06-13 DIAGNOSIS — Z87891 Personal history of nicotine dependence: Secondary | ICD-10-CM

## 2021-06-13 DIAGNOSIS — J441 Chronic obstructive pulmonary disease with (acute) exacerbation: Secondary | ICD-10-CM | POA: Insufficient documentation

## 2021-06-13 NOTE — Patient Instructions (Addendum)
Congratulations on not smoking -  it's the most important aspect of your care.  ? ?Ok to try off incruse to see if it affects your wind /breathing when walking and if worse consider restarting incruse or substituting anoro (the highest octane)  ? ?We will call you to set up our lung cancer screening program from the [place] office but the scan can be dose here at [facility] ? ?Follow up is as needed with PFTs in return if you don't feel like you are doing well ?

## 2021-06-13 NOTE — Assessment & Plan Note (Signed)
Pt declined pfts today which is fine with me as long as has reached desired level of ex tol and not having aecopd as the stopping smoking has major impact on her symptoms ? ?I did rec alpha one screening and maint of cigs.    ?I reviewed the Fletcher curve with the patient that basically indicates  if you quit smoking when your best day FEV1 is still   preserved (as is likely  the case here)  it is highly unlikely you will progress to severe disease and informed the patient there was  no medication on the market that has proven to alter the curve/ its downward trajectory  or the likelihood of progression of their disease(unlike other chronic medical conditions such as atheroclerosis where we do think we can change the natural hx with risk reducing meds)    Therefore stopping smoking and maintaining abstinence are  the most important aspects of care, not choice of inhalers or for that matter, doctors.  ? ?Treatment other than smoking cessation  is entirely directed by severity of symptoms and focused also on reducing exacerbations, not attempting to change the natural history of the disease.   ?

## 2021-06-13 NOTE — Assessment & Plan Note (Signed)
Quit smoking 05/29/21  ?- referred to lung cancer screening program 06/13/2021 >>>  ? ?Low-dose CT lung cancer screening is recommended for patients who ?are 22-71 years of age with a 20+ pack-year history of smoking and ?who are currently smoking or quit <=15 years ago. ?No coughing up blood  ?No unintentional weight loss of > 15 pounds in the last 6 months  ?>>> eligible thru age 48 under current guidelines > referred for shared decision making. ? ? ?Each maintenance medication was reviewed in detail including emphasizing most importantly the difference between maintenance and prns and under what circumstances the prns are to be triggered using an action plan format where appropriate. ? ?Total time for H and P, chart review, counseling, reviewing dpi device(s) , directly observing portions of ambulatory 02 saturation study/ and generating customized AVS unique to this office visit / same day charting = 38 min  ?     ?  ?      ?

## 2021-06-13 NOTE — Progress Notes (Signed)
? ?Desiree Barber, female    DOB: [birth year],    MRN: [id number] ? ? ?Brief patient profile:  ?70  yowf  quit smoking 05/29/21 with doe/cough which improved  referred to pulmonary clinic in [facility]  06/13/2021 by Desiree Mares PA for ? 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  ? ? ? ? ?History of Present Illness  ?06/13/2021  Pulmonary/ 1st office eval/ Desiree Barber / Desiree Barber Office  ?Chief Complaint  ?Patient presents with  ? Consult  ?  Consult for COPD with hypoxia and need for O2 form [facility]   ?Dyspnea:  12-15 min walks 1/2 mile some inclines s stopping on incruse x > year. 02 sats now ok p stops but not checking at peak ex  ?Cough: none  ?Sleep: well / on side bed is flat  ?SABA use: none  ? ?No obvious day to day or daytime variability or assoc excess/ purulent sputum or mucus plugs or hemoptysis or cp or chest tightness, subjective wheeze or overt sinus or hb symptoms.  ? ?Sleeping  without nocturnal  or early am exacerbation  of respiratory  c/o's or need for noct saba. Also denies any obvious fluctuation of symptoms with weather or environmental changes or other aggravating or alleviating factors except as outlined above  ? ?No unusual exposure hx or h/o childhood pna/ asthma or knowledge of premature birth. ? ?Current Allergies, Complete Past Medical History, Past Surgical History, Family History, and Social History were reviewed in [employer] record. ? ?ROS  The following are not active complaints unless bolded ?Hoarseness, sore throat, dysphagia, dental problems, itching, sneezing,  nasal congestion or discharge of excess mucus or purulent secretions, ear ache,   fever, chills, sweats, unintended wt loss or wt gain, classically pleuritic or exertional cp,  orthopnea pnd or arm/hand swelling  or leg swelling, presyncope, palpitations, abdominal pain, anorexia, nausea, vomiting, diarrhea  or change in bowel habits or change in bladder habits, change in stools or change in urine,  dysuria, hematuria,  rash, arthralgias, visual complaints, headache, numbness, weakness or ataxia or problems with walking or coordination,  change in mood or  memory. ?      ?   ? ?Past Medical History:  ?Diagnosis Date  ? Anxiety   ? Occasional  ? Arthritis   ? COPD (chronic obstructive pulmonary disease) ([place])   ? HTN (hypertension)   ? ? ?Outpatient Medications Prior to Visit  ?Medication Sig Dispense Refill  ? ALPRAZolam (XANAX) 0.25 MG tablet Take 0.25 mg by mouth daily as needed.    ? Ascorbic Acid (VITAMIN C) 100 MG tablet Take 100 mg by mouth daily.    ? umeclidinium bromide (INCRUSE ELLIPTA) 62.5 MCG/INH AEPB Inhale 1 puff into the lungs daily.    ? vitamin B-12 (CYANOCOBALAMIN) 100 MCG tablet Take 100 mcg by mouth daily.    ? furosemide (LASIX) 20 MG tablet Take 1 tablet (20 mg total) by mouth daily as needed for fluid or edema (shortness of breath). 30 tablet 0  ?       ?  ? ? ?Objective:  ?  ? ?BP 140/82 (BP Location: Left Arm, Patient Position: Sitting)   Pulse 80   Temp 97.7 ?F (36.5 ?C) (Temporal)   Ht '5\' 2"'$  (1.575 m)   Wt 98 lb (44.5 kg)   SpO2 97% Comment: ra  BMI 17.92 kg/m?  ? ?SpO2: 97 % (ra) ? ?HEENT : pt wearing mask not removed for exam due  to covid - 19 concerns.  ? ?NECK :  without JVD/Nodes/TM/ nl carotid upstrokes bilaterally ? ? ?LUNGS: no acc muscle use,  Min barrel  contour chest wall with bilateral  slightly decreased bs s audible wheeze and  without cough on insp or exp maneuvers and min  Hyperresonant  to  percussion bilaterally   ? ? ?CV:  RRR  no s3 or murmur or increase in P2, and no edema  ? ?ABD:  soft and nontender with pos end  insp Hoover's  in the supine position. No bruits or organomegaly appreciated, bowel sounds nl ? ?MS:   Nl gait/  ext warm without deformities, calf tenderness, cyanosis or clubbing ?No obvious joint restrictions  ? ?SKIN: warm and dry without lesions   ? ?NEURO:  alert, approp, nl sensorium with  no motor or cerebellar deficits apparent.  ?     ? ? ?   ?Assessment  ? ?COPD  GOLD ? / group B  ?Pt declined pfts today which is fine with me as long as has reached desired level of ex tol and not having aecopd as the stopping smoking has major impact on her symptoms ? ?I did rec alpha one screening and maint of cigs.    ?I reviewed the Fletcher curve with the patient that basically indicates  if you quit smoking when your best day FEV1 is still   preserved (as is likely  the case here)  it is highly unlikely you will progress to severe disease and informed the patient there was  no medication on the market that has proven to alter the curve/ its downward trajectory  or the likelihood of progression of their disease(unlike other chronic medical conditions such as atheroclerosis where we do think we can change the natural hx with risk reducing meds)    Therefore stopping smoking and maintaining abstinence are  the most important aspects of care, not choice of inhalers or for that matter, doctors.  ? ?Treatment other than smoking cessation  is entirely directed by severity of symptoms and focused also on reducing exacerbations, not attempting to change the natural history of the disease.   ? ? ? ? ?Desiree Gully, MD ?06/13/2021 ?   ?

## 2021-06-15 LAB — ALPHA-1-ANTITRYPSIN PHENOTYP: A-1 Antitrypsin: 177 mg/dL (ref 101–187)

## 2021-06-16 DIAGNOSIS — Z20822 Contact with and (suspected) exposure to covid-19: Secondary | ICD-10-CM | POA: Diagnosis not present

## 2021-06-23 DIAGNOSIS — J449 Chronic obstructive pulmonary disease, unspecified: Secondary | ICD-10-CM | POA: Diagnosis not present

## 2021-06-23 DIAGNOSIS — I10 Essential (primary) hypertension: Secondary | ICD-10-CM | POA: Diagnosis not present

## 2021-06-23 DIAGNOSIS — Z20822 Contact with and (suspected) exposure to covid-19: Secondary | ICD-10-CM | POA: Diagnosis not present

## 2021-07-10 DIAGNOSIS — Z20822 Contact with and (suspected) exposure to covid-19: Secondary | ICD-10-CM | POA: Diagnosis not present

## 2021-07-11 DIAGNOSIS — Z20822 Contact with and (suspected) exposure to covid-19: Secondary | ICD-10-CM | POA: Diagnosis not present

## 2021-07-17 DIAGNOSIS — R059 Cough, unspecified: Secondary | ICD-10-CM | POA: Diagnosis not present

## 2021-07-17 DIAGNOSIS — Z20822 Contact with and (suspected) exposure to covid-19: Secondary | ICD-10-CM | POA: Diagnosis not present

## 2021-07-17 DIAGNOSIS — R051 Acute cough: Secondary | ICD-10-CM | POA: Diagnosis not present

## 2021-07-26 ENCOUNTER — Other Ambulatory Visit: Payer: Self-pay

## 2021-07-26 DIAGNOSIS — Z122 Encounter for screening for malignant neoplasm of respiratory organs: Secondary | ICD-10-CM

## 2021-07-26 DIAGNOSIS — Z87891 Personal history of nicotine dependence: Secondary | ICD-10-CM

## 2021-07-30 DIAGNOSIS — Z20822 Contact with and (suspected) exposure to covid-19: Secondary | ICD-10-CM | POA: Diagnosis not present

## 2021-07-31 DIAGNOSIS — Z20822 Contact with and (suspected) exposure to covid-19: Secondary | ICD-10-CM | POA: Diagnosis not present

## 2021-09-08 ENCOUNTER — Ambulatory Visit (INDEPENDENT_AMBULATORY_CARE_PROVIDER_SITE_OTHER): Payer: Medicare Other | Admitting: Acute Care

## 2021-09-08 ENCOUNTER — Encounter: Payer: Self-pay | Admitting: Acute Care

## 2021-09-08 DIAGNOSIS — Z87891 Personal history of nicotine dependence: Secondary | ICD-10-CM

## 2021-09-08 NOTE — Patient Instructions (Signed)
Thank you for participating in the [facility] Cancer Screening Program. It was our pleasure to meet you today. We will call you with the results of your scan within the next few days. Your scan will be assigned a Lung RADS category score by the physicians reading the scans.  This Lung RADS score determines follow up scanning.  See below for description of categories, and follow up screening recommendations. We will be in touch to schedule your follow up screening annually or based on recommendations of our providers. We will fax a copy of your scan results to your Primary Care Physician, or the physician who referred you to the program, to ensure they have the results. Please call the office if you have any questions or concerns regarding your scanning experience or results.  Our office number is [phone]. Please speak with Denise Phelps, RN. , or  Denise Buckner RN, They are  our Lung Cancer Screening RN.'s If They are unavailable when you call, Please leave a message on the voice mail. We will return your call at our earliest convenience.This voice mail is monitored several times a day.  Remember, if your scan is normal, we will scan you annually as long as you continue to meet the criteria for the program. (Age 55-77, Current smoker or smoker who has quit within the last 15 years). If you are a smoker, remember, quitting is the single most powerful action that you can take to decrease your risk of lung cancer and other pulmonary, breathing related problems. We know quitting is hard, and we are here to help.  Please let us know if there is anything we can do to help you meet your goal of quitting. If you are a former smoker, congratulations. We are proud of you! Remain smoke free! Remember you can refer friends or family members through the number above.  We will screen them to make sure they meet criteria for the program. Thank you for helping us take better care of you by  participating in Lung Screening.  You can receive free nicotine replacement therapy ( patches, gum or mints) by calling 1-800-QUIT NOW. Please call so we can get you on the path to becoming  a non-smoker. I know it is hard, but you can do this!  Lung RADS Categories:  Lung RADS 1: no nodules or definitely non-concerning nodules.  Recommendation is for a repeat annual scan in 12 months.  Lung RADS 2:  nodules that are non-concerning in appearance and behavior with a very low likelihood of becoming an active cancer. Recommendation is for a repeat annual scan in 12 months.  Lung RADS 3: nodules that are probably non-concerning , includes nodules with a low likelihood of becoming an active cancer.  Recommendation is for a 6-month repeat screening scan. Often noted after an upper respiratory illness. We will be in touch to make sure you have no questions, and to schedule your 6-month scan.  Lung RADS 4 A: nodules with concerning findings, recommendation is most often for a follow up scan in 3 months or additional testing based on our provider's assessment of the scan. We will be in touch to make sure you have no questions and to schedule the recommended 3 month follow up scan.  Lung RADS 4 B:  indicates findings that are concerning. We will be in touch with you to schedule additional diagnostic testing based on our provider's  assessment of the scan.  Other options for assistance in smoking cessation (   As covered by your insurance benefits)  Hypnosis for smoking cessation  [employer] [phone]  Acupuncture for smoking cessation  [facility] [phone]   

## 2021-09-08 NOTE — Progress Notes (Signed)
Virtual Visit via Telephone Note  I connected with Desiree Barber on 02/07/21 at  2:00 PM EST by telephone and verified that I am speaking with the correct person using two identifiers.  Location: Patient: Home Provider: Working from home   I discussed the limitations, risks, security and privacy concerns of performing an evaluation and management service by telephone and the availability of in person appointments. I also discussed with the patient that there may be a patient responsible charge related to this service. The patient expressed understanding and agreed to proceed.  Shared Decision Making Visit Lung Cancer Screening Program [phone])   Eligibility: Age 71 y.o. Pack Years Smoking History Calculation 27 (# packs/per year x # years smoked) Recent History of coughing up blood  no Unexplained weight loss? no ( >Than 15 pounds within the last 6 months ) Prior History Lung / other cancer no (Diagnosis within the last 5 years already requiring surveillance chest CT Scans). Smoking Status Former Smoker Former Smokers: Years since quit: < 1 year  Quit Date: 05/29/2021  Visit Components: Discussion included one or more decision making aids. yes Discussion included risk/benefits of screening. yes Discussion included potential follow up diagnostic testing for abnormal scans. yes Discussion included meaning and risk of over diagnosis. yes Discussion included meaning and risk of False Positives. yes Discussion included meaning of total radiation exposure. yes  Counseling Included: Importance of adherence to annual lung cancer LDCT screening. yes Impact of comorbidities on ability to participate in the program. yes Ability and willingness to under diagnostic treatment. yes  Smoking Cessation Counseling: Current Smokers:  Discussed importance of smoking cessation. yes Information about tobacco cessation classes and interventions provided to patient. yes Patient provided with  "ticket" for LDCT Scan. yes Symptomatic Patient. no  Counseling NA Diagnosis Code: Tobacco Use Z72.0 Asymptomatic Patient yes  Counseling NA Former Smokers:  Discussed the importance of maintaining cigarette abstinence. yes Diagnosis Code: Personal History of Nicotine Dependence. O67.124 Information about tobacco cessation classes and interventions provided to patient. Yes Patient provided with "ticket" for LDCT Scan. yes Written Order for Lung Cancer Screening with LDCT placed in Epic. Yes (CT Chest Lung Cancer Screening Low Dose W/O CM) [id number] Z12.2-Screening of respiratory organs Z87.891-Personal history of nicotine dependence   I spent 25 minutes of face to face time with her discussing the risks and benefits of lung cancer screening. We viewed a power point together that explained in detail the above noted topics. We took the time to pause the power point at intervals to allow for questions to be asked and answered to ensure understanding. We discussed that she had taken the single most powerful action possible to decrease her risk of developing lung cancer when she quit smoking. I counseled her to remain smoke free, and to contact me if she ever had the desire to smoke again so that I can provide resources and tools to help support the effort to remain smoke free. We discussed the time and location of the scan, and that either  Doroteo Glassman RN or I will call with the results within  24-48 hours of receiving them. She has my card and contact information in the event she needs to speak with me, in addition to a copy of the power point we reviewed as a resource. She verbalized understanding of all of the above and had no further questions upon leaving the office.     I explained to the patient that there has been a high incidence  of coronary artery disease noted on these exams. I explained that this is a non-gated exam therefore degree or severity cannot be determined. This patient is not  on statin therapy. I have asked the patient to follow-up with their PCP regarding any incidental finding of coronary artery disease and management with diet or medication as they feel is clinically indicated. The patient verbalized understanding of the above and had no further questions.   Daymian Lill D. Kenton Kingfisher, NP-C [facility] Personal contact information can be found on Amion  09/08/2021, 9:38 AM

## 2021-09-11 ENCOUNTER — Ambulatory Visit (HOSPITAL_COMMUNITY)
Admission: RE | Admit: 2021-09-11 | Discharge: 2021-09-11 | Disposition: A | Discharge status: Home / Self Care | Payer: Medicare Other | Source: Ambulatory Visit | Attending: Acute Care | Admitting: Acute Care

## 2021-09-11 ENCOUNTER — Telehealth: Payer: Self-pay | Admitting: Acute Care

## 2021-09-11 DIAGNOSIS — Z122 Encounter for screening for malignant neoplasm of respiratory organs: Secondary | ICD-10-CM | POA: Diagnosis not present

## 2021-09-11 DIAGNOSIS — J439 Emphysema, unspecified: Secondary | ICD-10-CM | POA: Diagnosis not present

## 2021-09-11 DIAGNOSIS — Z87891 Personal history of nicotine dependence: Secondary | ICD-10-CM | POA: Diagnosis not present

## 2021-09-11 NOTE — Telephone Encounter (Signed)
IMPRESSION: 1. Lung-RADS 4A, suspicious. Follow up low-dose chest CT without contrast in 3 months (please use the following order, "CT CHEST LCS NODULE FOLLOW-UP W/O CM") is recommended. Innumerable solid pulmonary nodules scattered throughout both lungs, largest 14.9 mm in volume derived mean diameter in the subpleural peripheral basilar right lower lobe, potentially inflammatory given widespread moderate cylindrical bronchiectasis. 2. Three-vessel coronary atherosclerosis. 3. Aortic Atherosclerosis (ICD10-I70.0) and Emphysema (ICD10-J43.9).   These results will be called to the ordering clinician or representative by the Radiologist Assistant, and communication documented in the PACS or [employer].     Electronically Signed   By: Ilona Sorrel M.D.   On: 09/11/2021 11:48

## 2021-09-12 DIAGNOSIS — Z681 Body mass index (BMI) 19 or less, adult: Secondary | ICD-10-CM | POA: Diagnosis not present

## 2021-09-12 DIAGNOSIS — J449 Chronic obstructive pulmonary disease, unspecified: Secondary | ICD-10-CM | POA: Diagnosis not present

## 2021-09-12 DIAGNOSIS — F419 Anxiety disorder, unspecified: Secondary | ICD-10-CM | POA: Diagnosis not present

## 2021-09-15 ENCOUNTER — Telehealth: Payer: Self-pay | Admitting: Acute Care

## 2021-09-15 DIAGNOSIS — Z87891 Personal history of nicotine dependence: Secondary | ICD-10-CM

## 2021-09-15 DIAGNOSIS — R911 Solitary pulmonary nodule: Secondary | ICD-10-CM

## 2021-09-15 NOTE — Telephone Encounter (Signed)
I have called the patient with the results of her low dose Ct Chest. I have explained that her scan was read as a Lung RADS 4 A : suspicious findings, either short term follow up in 3 months or alternatively  PET Scan evaluation may be considered when there is a solid component of  8 mm or larger. There is a 14.9 mm nodule in the subpleural peripheral basilar right lower lobenoted on the scan, potentially inflammatory given widespread moderate cylindrical bronchiectasis. She is in agreement with a 3 month follow up.   Dr. Sherene Sires, she wanted me to let you know findings. I don't think she has a flutter valve for her bronchiectasis. She will get a 3 month follow up in September.   Schae, this is patient's first scan, not sure if this nodule will qualify as new, but wanted you to know.   Angelique Blonder, please fax results to PCP and order 3 month follow up low dose CT chest. Thanks so much.

## 2021-12-20 ENCOUNTER — Other Ambulatory Visit (HOSPITAL_COMMUNITY): Payer: Self-pay | Admitting: Family Medicine

## 2021-12-20 DIAGNOSIS — R7981 Abnormal blood-gas level: Secondary | ICD-10-CM | POA: Diagnosis not present

## 2021-12-20 DIAGNOSIS — J449 Chronic obstructive pulmonary disease, unspecified: Secondary | ICD-10-CM | POA: Diagnosis not present

## 2021-12-20 DIAGNOSIS — Z1231 Encounter for screening mammogram for malignant neoplasm of breast: Secondary | ICD-10-CM

## 2021-12-20 DIAGNOSIS — Z1331 Encounter for screening for depression: Secondary | ICD-10-CM | POA: Diagnosis not present

## 2021-12-20 DIAGNOSIS — Z23 Encounter for immunization: Secondary | ICD-10-CM | POA: Diagnosis not present

## 2021-12-20 DIAGNOSIS — F419 Anxiety disorder, unspecified: Secondary | ICD-10-CM | POA: Diagnosis not present

## 2021-12-20 DIAGNOSIS — Z6820 Body mass index (BMI) 20.0-20.9, adult: Secondary | ICD-10-CM | POA: Diagnosis not present

## 2021-12-20 DIAGNOSIS — Z0001 Encounter for general adult medical examination with abnormal findings: Secondary | ICD-10-CM | POA: Diagnosis not present

## 2021-12-21 ENCOUNTER — Other Ambulatory Visit (HOSPITAL_COMMUNITY): Payer: Self-pay | Admitting: Family Medicine

## 2021-12-21 ENCOUNTER — Other Ambulatory Visit: Payer: Self-pay | Admitting: *Deleted

## 2021-12-21 DIAGNOSIS — Z87891 Personal history of nicotine dependence: Secondary | ICD-10-CM

## 2021-12-21 DIAGNOSIS — R911 Solitary pulmonary nodule: Secondary | ICD-10-CM

## 2021-12-21 DIAGNOSIS — M79601 Pain in right arm: Secondary | ICD-10-CM

## 2021-12-23 ENCOUNTER — Encounter (HOSPITAL_COMMUNITY): Payer: Self-pay | Admitting: *Deleted

## 2021-12-23 ENCOUNTER — Observation Stay (HOSPITAL_COMMUNITY)
Admission: EM | Admit: 2021-12-23 | Discharge: 2021-12-24 | Disposition: A | Discharge status: Home / Self Care | Payer: Medicare Other | Attending: Family Medicine | Admitting: Family Medicine

## 2021-12-23 ENCOUNTER — Other Ambulatory Visit: Payer: Self-pay

## 2021-12-23 ENCOUNTER — Emergency Department (HOSPITAL_COMMUNITY): Payer: Medicare Other

## 2021-12-23 DIAGNOSIS — I10 Essential (primary) hypertension: Secondary | ICD-10-CM | POA: Diagnosis not present

## 2021-12-23 DIAGNOSIS — F419 Anxiety disorder, unspecified: Secondary | ICD-10-CM | POA: Diagnosis present

## 2021-12-23 DIAGNOSIS — Z79899 Other long term (current) drug therapy: Secondary | ICD-10-CM | POA: Insufficient documentation

## 2021-12-23 DIAGNOSIS — Z87891 Personal history of nicotine dependence: Secondary | ICD-10-CM | POA: Insufficient documentation

## 2021-12-23 DIAGNOSIS — J441 Chronic obstructive pulmonary disease with (acute) exacerbation: Secondary | ICD-10-CM | POA: Diagnosis not present

## 2021-12-23 DIAGNOSIS — D72829 Elevated white blood cell count, unspecified: Secondary | ICD-10-CM | POA: Insufficient documentation

## 2021-12-23 DIAGNOSIS — R0602 Shortness of breath: Secondary | ICD-10-CM | POA: Diagnosis not present

## 2021-12-23 DIAGNOSIS — J9601 Acute respiratory failure with hypoxia: Secondary | ICD-10-CM | POA: Diagnosis not present

## 2021-12-23 DIAGNOSIS — Z20822 Contact with and (suspected) exposure to covid-19: Secondary | ICD-10-CM | POA: Insufficient documentation

## 2021-12-23 DIAGNOSIS — Z681 Body mass index (BMI) 19 or less, adult: Secondary | ICD-10-CM | POA: Diagnosis not present

## 2021-12-23 DIAGNOSIS — R030 Elevated blood-pressure reading, without diagnosis of hypertension: Secondary | ICD-10-CM | POA: Diagnosis not present

## 2021-12-23 DIAGNOSIS — R0689 Other abnormalities of breathing: Secondary | ICD-10-CM | POA: Diagnosis not present

## 2021-12-23 DIAGNOSIS — F17200 Nicotine dependence, unspecified, uncomplicated: Secondary | ICD-10-CM | POA: Insufficient documentation

## 2021-12-23 LAB — CBC WITH DIFFERENTIAL/PLATELET
Abs Immature Granulocytes: 0.05 10*3/uL (ref 0.00–0.07)
Basophils Absolute: 0.1 10*3/uL (ref 0.0–0.1)
Basophils Relative: 1 %
Eosinophils Absolute: 0.1 10*3/uL (ref 0.0–0.5)
Eosinophils Relative: 1 %
HCT: 38.1 % (ref 36.0–46.0)
Hemoglobin: 11.1 g/dL — ABNORMAL LOW (ref 12.0–15.0)
Immature Granulocytes: 0 %
Lymphocytes Relative: 14 %
Lymphs Abs: 1.8 10*3/uL (ref 0.7–4.0)
MCH: 25.1 pg — ABNORMAL LOW (ref 26.0–34.0)
MCHC: 29.1 g/dL — ABNORMAL LOW (ref 30.0–36.0)
MCV: 86.2 fL (ref 80.0–100.0)
Monocytes Absolute: 1.2 10*3/uL — ABNORMAL HIGH (ref 0.1–1.0)
Monocytes Relative: 10 %
Neutro Abs: 9.2 10*3/uL — ABNORMAL HIGH (ref 1.7–7.7)
Neutrophils Relative %: 74 %
Platelets: 458 10*3/uL — ABNORMAL HIGH (ref 150–400)
RBC: 4.42 MIL/uL (ref 3.87–5.11)
RDW: 16.5 % — ABNORMAL HIGH (ref 11.5–15.5)
WBC: 12.5 10*3/uL — ABNORMAL HIGH (ref 4.0–10.5)
nRBC: 0 % (ref 0.0–0.2)

## 2021-12-23 LAB — BASIC METABOLIC PANEL
Anion gap: 9 (ref 5–15)
BUN: 10 mg/dL (ref 8–23)
CO2: 29 mmol/L (ref 22–32)
Calcium: 9.1 mg/dL (ref 8.9–10.3)
Chloride: 100 mmol/L (ref 98–111)
Creatinine, Ser: 0.57 mg/dL (ref 0.44–1.00)
GFR, Estimated: >60 mL/min (ref >60)
Glucose, Bld: 109 mg/dL — ABNORMAL HIGH (ref 70–99)
Potassium: 3.5 mmol/L (ref 3.5–5.1)
Sodium: 138 mmol/L (ref 135–145)

## 2021-12-23 LAB — BRAIN NATRIURETIC PEPTIDE: B Natriuretic Peptide: 57 pg/mL (ref 0.0–100.0)

## 2021-12-23 MED ORDER — SODIUM CHLORIDE 0.9 % IV SOLN
500.0000 mg | INTRAVENOUS | Status: DC
  Administered 2021-12-23: 500 mg via INTRAVENOUS
  Filled 2021-12-23: qty 5

## 2021-12-23 MED ORDER — METHYLPREDNISOLONE SODIUM SUCC 125 MG IJ SOLR
125.0000 mg | Freq: Once | INTRAMUSCULAR | Status: AC
  Administered 2021-12-23: 125 mg via INTRAVENOUS
  Filled 2021-12-23: qty 2

## 2021-12-23 NOTE — ED Triage Notes (Signed)
Pt sent here from [facility] for RA sats were low. Pt received a neb tx and Covid/flu test-was negative.  Pt does state she has been short of breath with exertion. + occ. Cough.  Pt's sats on RA is 93% in triage. Per Karrie Doffing pt's RA sats was 88%. Pt states she has COPD and has quit smoking back in March.

## 2021-12-23 NOTE — ED Provider Notes (Signed)
[facility] EMERGENCY DEPARTMENT Provider Note   CSN: [id number] Arrival date & time: 12/23/21  1535     History {Add pertinent medical, surgical, social history, OB history to HPI:1} Chief Complaint  Patient presents with   Shortness of Breath    Desiree Barber is a 71 y.o. female with HTN, diverticulosis, COPD on ***L [place] O2 at home,  presents with SOB.  Patient  ***   RA sats were low. Pt received a neb tx and Covid/flu test-was negative.  Pt does state she has been short of breath with exertion. + occ. Cough.  Pt's sats on RA is 93% in triage. Per Karrie Doffing pt's RA sats was 88%. Pt states she has COPD and has quit smoking back in March.     Shortness of Breath      Home Medications Prior to Admission medications   Medication Sig Start Date End Date Taking? Authorizing Provider  ALPRAZolam (XANAX) 0.25 MG tablet Take 0.25 mg by mouth daily as needed. 06/12/21   [provider]  Ascorbic Acid (VITAMIN C) 100 MG tablet Take 100 mg by mouth daily.    [provider]  furosemide (LASIX) 20 MG tablet Take 1 tablet (20 mg total) by mouth daily as needed for fluid or edema (shortness of breath). 07/25/19 07/24/20  Manuella Ghazi, Pratik D, DO  umeclidinium bromide (INCRUSE ELLIPTA) 62.5 MCG/INH AEPB Inhale 1 puff into the lungs daily.    [provider]  vitamin B-12 (CYANOCOBALAMIN) 100 MCG tablet Take 100 mcg by mouth daily.    [provider]      Allergies    Demerol [meperidine], Oxycontin [oxycodone hcl], and Fish allergy    Review of Systems   Review of Systems  Respiratory:  Positive for shortness of breath.    Review of systems positive/negative for ***.  A 10 point review of systems was performed and is negative unless otherwise reported in HPI.  Physical Exam Updated Vital Signs BP 136/66 (BP Location: Right Arm)   Pulse 100   Temp 97.9 F (36.6 C) (Oral)   Resp 18   SpO2 93%  Physical Exam General: Normal appearing ***female/female,  lying in bed.  HEENT: PERRLA, Sclera anicteric, MMM, trachea midline. Cardiology: RRR, no murmurs/rubs/gallops. BL radial and DP pulses equal bilaterally.  Resp: Normal respiratory rate and effort. CTAB, no wheezes, rhonchi, crackles.  Abd: Soft, non-tender, non-distended. No rebound tenderness or guarding.  GU: Deferred. MSK: No peripheral edema or signs of trauma. Extremities without deformity or TTP. No cyanosis or clubbing. Skin: warm, dry. No rashes or lesions. Back: No CVA tenderness Neuro: A&Ox4, CNs II-XII grossly intact. MAEs. Sensation grossly intact.  Psych: Normal mood and affect.   ED Results / Procedures / Treatments   Labs (all labs ordered are listed, but only abnormal results are displayed) Labs Reviewed - No data to display  EKG None  Radiology DG Chest 2 View  Result Date: 12/23/2021 CLINICAL DATA:  Short of breath.  Low oxygen saturation. EXAM: CHEST - 2 VIEW COMPARISON:  08/09/2020. FINDINGS: Cardiac silhouette normal in size. No mediastinal or hilar masses or evidence of adenopathy. Lungs are hyperexpanded. There are thickened interstitial markings as well as additional reticulonodular type opacities, stable. No lung consolidation. No convincing edema. No pleural effusion or pneumothorax. Skeletal structures are intact. IMPRESSION: 1. No acute cardiopulmonary disease. 2. Chronic lung findings as detailed, without convincing change compared to the lung cancer screening chest CT dated 09/11/2021. Electronically Signed   By: Shanon Brow  Ormond M.D.   On: 12/23/2021 16:14    Procedures Procedures  {Document cardiac monitor, telemetry assessment procedure when appropriate:1}  Medications Ordered in ED Medications - No data to display  ED Course/ Medical Decision Making/ A&P                          Medical Decision Making Amount and/or Complexity of Data Reviewed Radiology: ordered.    '@HNNMDM'$ @ ***  I have personally reviewed and interpreted all labs and  imaging.      {Document critical care time when appropriate:1} {Document review of labs and clinical decision tools ie heart score, Chads2Vasc2 etc:1}  {Document your independent review of radiology images, and any outside records:1} {Document your discussion with family members, caretakers, and with consultants:1} {Document social determinants of health affecting pt's care:1} {Document your decision making why or why not admission, treatments were needed:1} Final Clinical Impression(s) / ED Diagnoses Final diagnoses:  None    Rx / DC Orders ED Discharge Orders     None        This note was created using dictation software, which may contain spelling or grammatical errors.

## 2021-12-23 NOTE — ED Notes (Signed)
Pt given peanut butter crackers and water per request.

## 2021-12-23 NOTE — ED Notes (Signed)
This RN ambulated Pt on Pulse Ox, Room Air around [place]. Pts HR increased from 102 to 115, and O2 Sats dropped from 91% to 69%. Pt returned to exam room and placed on 2L Nasal Cannula. Pts O2 Sats improved to 93%. Pt endorsed feeling exhausted and "out of breath." EDP Notified.

## 2021-12-24 DIAGNOSIS — J9601 Acute respiratory failure with hypoxia: Secondary | ICD-10-CM

## 2021-12-24 DIAGNOSIS — J441 Chronic obstructive pulmonary disease with (acute) exacerbation: Secondary | ICD-10-CM | POA: Diagnosis not present

## 2021-12-24 DIAGNOSIS — D72829 Elevated white blood cell count, unspecified: Secondary | ICD-10-CM | POA: Diagnosis not present

## 2021-12-24 LAB — MAGNESIUM: Magnesium: 2.2 mg/dL (ref 1.7–2.4)

## 2021-12-24 LAB — COMPREHENSIVE METABOLIC PANEL
ALT: 20 U/L (ref 0–44)
AST: 22 U/L (ref 15–41)
Albumin: 3.3 g/dL — ABNORMAL LOW (ref 3.5–5.0)
Alkaline Phosphatase: 107 U/L (ref 38–126)
Anion gap: 5 (ref 5–15)
BUN: 7 mg/dL — ABNORMAL LOW (ref 8–23)
CO2: 28 mmol/L (ref 22–32)
Calcium: 8.3 mg/dL — ABNORMAL LOW (ref 8.9–10.3)
Chloride: 104 mmol/L (ref 98–111)
Creatinine, Ser: 0.66 mg/dL (ref 0.44–1.00)
GFR, Estimated: >60 mL/min (ref >60)
Glucose, Bld: 140 mg/dL — ABNORMAL HIGH (ref 70–99)
Potassium: 4.1 mmol/L (ref 3.5–5.1)
Sodium: 137 mmol/L (ref 135–145)
Total Bilirubin: 0.6 mg/dL (ref 0.3–1.2)
Total Protein: 6.8 g/dL (ref 6.5–8.1)

## 2021-12-24 LAB — CBC WITH DIFFERENTIAL/PLATELET
Abs Immature Granulocytes: 0.06 10*3/uL (ref 0.00–0.07)
Basophils Absolute: 0 10*3/uL (ref 0.0–0.1)
Basophils Relative: 0 %
Eosinophils Absolute: 0 10*3/uL (ref 0.0–0.5)
Eosinophils Relative: 0 %
HCT: 37.2 % (ref 36.0–46.0)
Hemoglobin: 10.6 g/dL — ABNORMAL LOW (ref 12.0–15.0)
Immature Granulocytes: 1 %
Lymphocytes Relative: 5 %
Lymphs Abs: 0.6 10*3/uL — ABNORMAL LOW (ref 0.7–4.0)
MCH: 24.8 pg — ABNORMAL LOW (ref 26.0–34.0)
MCHC: 28.5 g/dL — ABNORMAL LOW (ref 30.0–36.0)
MCV: 86.9 fL (ref 80.0–100.0)
Monocytes Absolute: 0.1 10*3/uL (ref 0.1–1.0)
Monocytes Relative: 1 %
Neutro Abs: 9.8 10*3/uL — ABNORMAL HIGH (ref 1.7–7.7)
Neutrophils Relative %: 93 %
Platelets: 451 10*3/uL — ABNORMAL HIGH (ref 150–400)
RBC: 4.28 MIL/uL (ref 3.87–5.11)
RDW: 16.5 % — ABNORMAL HIGH (ref 11.5–15.5)
WBC: 10.5 10*3/uL (ref 4.0–10.5)
nRBC: 0 % (ref 0.0–0.2)

## 2021-12-24 LAB — PROCALCITONIN: Procalcitonin: <0.1 ng/mL

## 2021-12-24 LAB — SARS CORONAVIRUS 2 BY RT PCR: SARS Coronavirus 2 by RT PCR: NEGATIVE

## 2021-12-24 MED ORDER — ACETAMINOPHEN 325 MG PO TABS: 650.0000 mg | ORAL_TABLET | Freq: Four times a day (QID) | ORAL | Status: DC | PRN

## 2021-12-24 MED ORDER — UMECLIDINIUM BROMIDE 62.5 MCG/ACT IN AEPB
1.0000 | INHALATION_SPRAY | Freq: Every day | RESPIRATORY_TRACT | Status: DC
  Administered 2021-12-24: 1 via RESPIRATORY_TRACT
  Filled 2021-12-24: qty 7

## 2021-12-24 MED ORDER — ONDANSETRON HCL 4 MG PO TABS: 4.0000 mg | ORAL_TABLET | Freq: Four times a day (QID) | ORAL | Status: DC | PRN

## 2021-12-24 MED ORDER — OXYCODONE HCL 5 MG PO TABS: 5.0000 mg | ORAL_TABLET | ORAL | Status: DC | PRN

## 2021-12-24 MED ORDER — ALBUTEROL SULFATE (2.5 MG/3ML) 0.083% IN NEBU: 2.5000 mg | INHALATION_SOLUTION | RESPIRATORY_TRACT | Status: DC | PRN

## 2021-12-24 MED ORDER — METHYLPREDNISOLONE SODIUM SUCC 125 MG IJ SOLR
60.0000 mg | Freq: Two times a day (BID) | INTRAMUSCULAR | Status: DC
  Administered 2021-12-24: 60 mg via INTRAVENOUS
  Filled 2021-12-24: qty 2

## 2021-12-24 MED ORDER — METHYLPREDNISOLONE SODIUM SUCC 125 MG IJ SOLR: 125.0000 mg | Freq: Two times a day (BID) | INTRAMUSCULAR | Status: DC

## 2021-12-24 MED ORDER — VITAMIN B-12 100 MCG PO TABS
100.0000 ug | ORAL_TABLET | Freq: Every day | ORAL | Status: DC
  Administered 2021-12-24: 100 ug via ORAL
  Filled 2021-12-24: qty 1

## 2021-12-24 MED ORDER — HEPARIN SODIUM (PORCINE) 5000 UNIT/ML IJ SOLN: 5000.0000 [IU] | Freq: Three times a day (TID) | INTRAMUSCULAR | Status: DC

## 2021-12-24 MED ORDER — MORPHINE SULFATE (PF) 2 MG/ML IV SOLN: 1.0000 mg | INTRAVENOUS | Status: DC | PRN

## 2021-12-24 MED ORDER — MOMETASONE FURO-FORMOTEROL FUM 200-5 MCG/ACT IN AERO
2.0000 | INHALATION_SPRAY | Freq: Two times a day (BID) | RESPIRATORY_TRACT | Status: DC
  Administered 2021-12-24: 2 via RESPIRATORY_TRACT
  Filled 2021-12-24: qty 8.8

## 2021-12-24 MED ORDER — ACETAMINOPHEN 650 MG RE SUPP: 650.0000 mg | Freq: Four times a day (QID) | RECTAL | Status: DC | PRN

## 2021-12-24 MED ORDER — PREDNISONE 20 MG PO TABS: 40.0000 mg | ORAL_TABLET | Freq: Every day | ORAL | Status: DC

## 2021-12-24 MED ORDER — ALPRAZOLAM 0.25 MG PO TABS: 0.2500 mg | ORAL_TABLET | Freq: Every day | ORAL | Status: DC | PRN

## 2021-12-24 MED ORDER — ONDANSETRON HCL 4 MG/2ML IJ SOLN: 4.0000 mg | Freq: Four times a day (QID) | INTRAMUSCULAR | Status: DC | PRN

## 2021-12-24 MED ORDER — MORPHINE SULFATE (PF) 2 MG/ML IV SOLN: 2.0000 mg | INTRAVENOUS | Status: DC | PRN

## 2021-12-24 NOTE — Assessment & Plan Note (Signed)
-   Continue Xanax 

## 2021-12-24 NOTE — Discharge Summary (Signed)
Physician Discharge Summary  Desiree Barber FYB:[id number] DOB: 1950-12-08 DOA: 12/23/2021  PCP: Sharilyn Sites, MD  Admit date: 12/23/2021 Discharge date: 12/24/2021  Admitted From:  Home  Disposition: Home   Recommendations for Outpatient Follow-up:  Follow up with PCP in 1 weeks   Home Health: DME Home Oxygen   Discharge Condition: STABLE   CODE STATUS: FULL DIET: resume prior home diet    Brief Hospitalization Summary: Please see all hospital notes, images, labs for full details of the hospitalization. 71 y.o. female with medical history significant of anxiety, COPD, hypertension, presents to the ED with a chief complaint of shortness of breath.  Patient reports that she had shortness of breath for the last 10 days.  She has been checking her O2 sats with a pulse ox every day, and today it was down to 78% so she decided she needed to come into the ER.  She reports her shortness of breath has been constant and progressively worsening.  She has had a cough that is nonproductive.  She has heard herself wheezing.  Patient reports she does not have a rescue inhaler at home but she but has been using her twice daily inhaler.  Patient reports that she has not had a fever or felt lightheaded.  She has had palpitations with exertion.  Patient reports she was not exerting herself when she checked her pulse ox today.  She reports she feels much better now than when she came in.  She is confused as to why her COPD would flare when she quit smoking 7 months ago.  We discussed other triggers of COPD flare including season change and allergies.  Patient has no other complaints at this time.   Patient does not smoke she drinks 1-2 beers daily and has never had withdrawals.  She does not use drugs.  She is vaccinated for COVID and she has had this years flu shot.  She is full code.  Hospital Course by Problem   Assessment and Plan: * Acute respiratory failure with hypoxia (HCC) - Oxygen sats dropped  down to 69% with ambulation - Secondary to COPD exacerbation see the respective plan - Continue supplemental O2 --> Pt qualifies for  Home oxygen  - Continue bronchodilators  Leukocytosis White blood cell count 12.5 - steroid induced   Anxiety disorder - Continue Xanax  COPD with acute exacerbation (HCC) - Patient desaturated to 69% with ambulation. - Normalized on 2 L nasal cannula - Continue Incruse and Dulera - Pt was treated with scheduled and as needed breathing treatments - treated with IV steroid  Pt requested to discharge home today. She has an Rx filled from her urgent care visit for azithromycin and prednisone taper.  I told her to finish that prescription.  We made arrangements for home oxygen for her.  She will DC home today.  Outpatient follow up with PCP recommended.   Discharge Diagnoses:  Principal Problem:   Acute respiratory failure with hypoxia ([place]) Active Problems:   COPD with acute exacerbation (HCC)   Anxiety disorder   Leukocytosis   Discharge Instructions:  Allergies as of 12/24/2021       Reactions   Demerol [meperidine]    Oxycontin [oxycodone Hcl]    Fish Allergy Nausea Only   Only fish from [place]        Medication List     TAKE these medications    ALPRAZolam 0.25 MG tablet Commonly known as: XANAX Take 0.25 mg by mouth daily as  needed.   furosemide 20 MG tablet Commonly known as: Lasix Take 1 tablet (20 mg total) by mouth daily as needed for fluid or edema (shortness of breath).   Incruse Ellipta 62.5 MCG/ACT Aepb Generic drug: umeclidinium bromide Inhale 1 puff into the lungs daily.   vitamin B-12 100 MCG tablet Commonly known as: CYANOCOBALAMIN Take 100 mcg by mouth daily.   vitamin C 100 MG tablet Take 100 mg by mouth daily.               Durable Medical Equipment  (From admission, onward)           Start     Ordered   12/24/21 1019  For home use only DME oxygen  Once       Question Answer  Comment  Length of Need Lifetime   Mode or (Route) Nasal cannula   Liters per Minute 2   Frequency Continuous (stationary and portable oxygen unit needed)   Oxygen conserving device Yes   Oxygen delivery system Gas      12/24/21 1018            Follow-up Information     Sharilyn Sites, MD. Schedule an appointment as soon as possible for a visit in 1 week(s).   Specialty: Family Medicine Why: Hospital Follow Up Contact information: [address] [place] [address] [facility].   Why: If you have not heard from the [facility] driver before you leave the hospital, please call [phone] and speak with the call center to let them know you are leaving and they will have the driver call you Contact information: [place] [address] [phone]                Allergies  Allergen Reactions   Demerol [Meperidine]    Oxycontin [Oxycodone Hcl]    Fish Allergy Nausea Only    Only fish from [place]   Allergies as of 12/24/2021       Reactions   Demerol [meperidine]    Oxycontin [oxycodone Hcl]    Fish Allergy Nausea Only   Only fish from [place]        Medication List     TAKE these medications    ALPRAZolam 0.25 MG tablet Commonly known as: XANAX Take 0.25 mg by mouth daily as needed.   furosemide 20 MG tablet Commonly known as: Lasix Take 1 tablet (20 mg total) by mouth daily as needed for fluid or edema (shortness of breath).   Incruse Ellipta 62.5 MCG/ACT Aepb Generic drug: umeclidinium bromide Inhale 1 puff into the lungs daily.   vitamin B-12 100 MCG tablet Commonly known as: CYANOCOBALAMIN Take 100 mcg by mouth daily.   vitamin C 100 MG tablet Take 100 mg by mouth daily.               Durable Medical Equipment  (From admission, onward)           Start     Ordered   12/24/21 1019  For home use only DME oxygen  Once       Question Answer Comment  Length of Need  Lifetime   Mode or (Route) Nasal cannula   Liters per Minute 2   Frequency Continuous (stationary and portable oxygen unit needed)   Oxygen conserving device Yes   Oxygen delivery system Gas      12/24/21 1018  Procedures/Studies: DG Chest 2 View  Result Date: 12/23/2021 CLINICAL DATA:  Short of breath.  Low oxygen saturation. EXAM: CHEST - 2 VIEW COMPARISON:  08/09/2020. FINDINGS: Cardiac silhouette normal in size. No mediastinal or hilar masses or evidence of adenopathy. Lungs are hyperexpanded. There are thickened interstitial markings as well as additional reticulonodular type opacities, stable. No lung consolidation. No convincing edema. No pleural effusion or pneumothorax. Skeletal structures are intact. IMPRESSION: 1. No acute cardiopulmonary disease. 2. Chronic lung findings as detailed, without convincing change compared to the lung cancer screening chest CT dated 09/11/2021. Electronically Signed   By: Lajean Manes M.D.   On: 12/23/2021 16:14     Subjective: Awake, alert, cooperative, no CP, Pt wants to go home today.   Discharge Exam: Vitals:   12/24/21 0400 12/24/21 0724  BP: 128/66   Pulse: 83   Resp: 18   Temp: 98.3 F (36.8 C)   SpO2: 99% 92%   Vitals:   12/24/21 0030 12/24/21 0315 12/24/21 0400 12/24/21 0724  BP: (!) 121/53 (!) 120/54 128/66   Pulse: 83 71 83   Resp: '18 16 18   '$ Temp:  98.3 F (36.8 C) 98.3 F (36.8 C)   TempSrc:  Oral    SpO2: 100% 98% 99% 92%  Weight:   52.3 kg   Height:   '5\' 1"'$  (1.549 m)    General: Pt is alert, awake, not in acute distress Cardiovascular: normal S1/S2 +, no rubs, no gallops Respiratory: good air movement, bibasilar exp wheezing heard Abdominal: Soft, NT, ND, bowel sounds + Extremities: no edema, no cyanosis   The results of significant diagnostics from this hospitalization (including imaging, microbiology, ancillary and laboratory) are listed below for reference.     Microbiology: Recent Results  (from the past 240 hour(s))  SARS Coronavirus 2 by RT PCR (hospital order, performed in [facility] hospital lab) *cepheid single result test* Anterior Nasal Swab     Status: None   Collection Time: 12/24/21 12:05 AM   Specimen: Anterior Nasal Swab  Result Value Ref Range Status   SARS Coronavirus 2 by RT PCR NEGATIVE NEGATIVE Final    Comment: (NOTE) SARS-CoV-2 target nucleic acids are NOT DETECTED.  The SARS-CoV-2 RNA is generally detectable in upper and lower respiratory specimens during the acute phase of infection. The lowest concentration of SARS-CoV-2 viral copies this assay can detect is 250 copies / mL. A negative result does not preclude SARS-CoV-2 infection and should not be used as the sole basis for treatment or other patient management decisions.  A negative result may occur with improper specimen collection / handling, submission of specimen other than nasopharyngeal swab, presence of viral mutation(s) within the areas targeted by this assay, and inadequate number of viral copies (<250 copies / mL). A negative result must be combined with clinical observations, patient history, and epidemiological information.  Fact Sheet for Patients:   https://www.patel.info/  Fact Sheet for Healthcare Providers: https://hall.com/  This test is not yet approved or  cleared by the [place] FDA and has been authorized for detection and/or diagnosis of SARS-CoV-2 by FDA under an Emergency Use Authorization (EUA).  This EUA will remain in effect (meaning this test can be used) for the duration of the COVID-19 declaration under Section 564(b)(1) of the Act, 21 U.S.C. section 360bbb-3(b)(1), unless the authorization is terminated or revoked sooner.  Performed at [facility], [address], [place], [place] [address]      Labs: BNP (last 3 results) Recent  Labs    12/23/21 2040  BNP 29.9   Basic Metabolic Panel: Recent Labs  Lab  12/23/21 2040 12/24/21 0454  NA 138 137  K 3.5 4.1  CL 100 104  CO2 29 28  GLUCOSE 109* 140*  BUN 10 7*  CREATININE 0.57 0.66  CALCIUM 9.1 8.3*  MG  --  2.2   Liver Function Tests: Recent Labs  Lab 12/24/21 0454  AST 22  ALT 20  ALKPHOS 107  BILITOT 0.6  PROT 6.8  ALBUMIN 3.3*   No results for input(s): "LIPASE", "AMYLASE" in the last 168 hours. No results for input(s): "AMMONIA" in the last 168 hours. CBC: Recent Labs  Lab 12/23/21 2040 12/24/21 0454  WBC 12.5* 10.5  NEUTROABS 9.2* 9.8*  HGB 11.1* 10.6*  HCT 38.1 37.2  MCV 86.2 86.9  PLT 458* 451*   Cardiac Enzymes: No results for input(s): "CKTOTAL", "CKMB", "CKMBINDEX", "TROPONINI" in the last 168 hours. BNP: Invalid input(s): "POCBNP" CBG: No results for input(s): "GLUCAP" in the last 168 hours. D-Dimer No results for input(s): "DDIMER" in the last 72 hours. Hgb A1c No results for input(s): "HGBA1C" in the last 72 hours. Lipid Profile No results for input(s): "CHOL", "HDL", "LDLCALC", "TRIG", "CHOLHDL", "LDLDIRECT" in the last 72 hours. Thyroid function studies No results for input(s): "TSH", "T4TOTAL", "T3FREE", "THYROIDAB" in the last 72 hours.  Invalid input(s): "FREET3" Anemia work up No results for input(s): "VITAMINB12", "FOLATE", "FERRITIN", "TIBC", "IRON", "RETICCTPCT" in the last 72 hours. Urinalysis    Component Value Date/Time   COLORURINE YELLOW 08/09/2020 1224   APPEARANCEUR CLEAR 08/09/2020 1224   LABSPEC 1.006 08/09/2020 1224   PHURINE 7.0 08/09/2020 1224   GLUCOSEU NEGATIVE 08/09/2020 1224   [place] 08/09/2020 [place] 08/09/2020 [place] 08/09/2020 [place] 08/09/2020 1224   NITRITE NEGATIVE 08/09/2020 [place] 08/09/2020 1224   Sepsis Labs Recent Labs  Lab 12/23/21 2040 12/24/21 0454  WBC 12.5* 10.5   Microbiology Recent Results (from the past 240 hour(s))  SARS Coronavirus 2 by RT PCR  (hospital order, performed in [facility] hospital lab) *cepheid single result test* Anterior Nasal Swab     Status: None   Collection Time: 12/24/21 12:05 AM   Specimen: Anterior Nasal Swab  Result Value Ref Range Status   SARS Coronavirus 2 by RT PCR NEGATIVE NEGATIVE Final    Comment: (NOTE) SARS-CoV-2 target nucleic acids are NOT DETECTED.  The SARS-CoV-2 RNA is generally detectable in upper and lower respiratory specimens during the acute phase of infection. The lowest concentration of SARS-CoV-2 viral copies this assay can detect is 250 copies / mL. A negative result does not preclude SARS-CoV-2 infection and should not be used as the sole basis for treatment or other patient management decisions.  A negative result may occur with improper specimen collection / handling, submission of specimen other than nasopharyngeal swab, presence of viral mutation(s) within the areas targeted by this assay, and inadequate number of viral copies (<250 copies / mL). A negative result must be combined with clinical observations, patient history, and epidemiological information.  Fact Sheet for Patients:   https://www.patel.info/  Fact Sheet for Healthcare Providers: https://hall.com/  This test is not yet approved or  cleared by the [place] FDA and has been authorized for detection and/or diagnosis of SARS-CoV-2 by FDA under an Emergency Use Authorization (EUA).  This EUA will remain in effect (meaning this test can be used)  for the duration of the COVID-19 declaration under Section 564(b)(1) of the Act, 21 U.S.C. section 360bbb-3(b)(1), unless the authorization is terminated or revoked sooner.  Performed at [facility], [address], [place], [place] [address]    Time coordinating discharge:  35 mins   SIGNED:  Irwin Brakeman, MD  [facility] 12/24/2021, 12:20 PM How to contact the [facility] Attending or Consulting provider [place] or covering provider during after hours [place], for this patient?  Check the care team in [facility] and look for a) attending/consulting [facility] provider listed and b) the [facility] team listed Log into [email or web address] and use [place] universal password to access. If you do not have the password, please contact the hospital operator. Locate the [facility] provider you are looking for under [facility] and page to a number that you can be directly reached. If you still have difficulty reaching the provider, please page the [facility] (Director on Call) for the Hospitalists listed on amion for assistance.

## 2021-12-24 NOTE — Discharge Instructions (Signed)

## 2021-12-24 NOTE — H&P (Signed)
History and Physical    Patient: Desiree Barber CBS:[id number] DOB: 1950-10-18 DOA: 12/23/2021 DOS: the patient was seen and examined on 12/24/2021 PCP: Sharilyn Sites, MD  Patient coming from: Home  Chief Complaint:  Chief Complaint  Patient presents with   Shortness of Breath   HPI: Desiree Barber is a 71 y.o. female with medical history significant of anxiety, COPD, hypertension, presents to the ED with a chief complaint of shortness of breath.  Patient reports that she had shortness of breath for the last 10 days.  She has been checking her O2 sats with a pulse ox every day, and today it was down to 78% so she decided she needed to come into the ER.  She reports her shortness of breath has been constant and progressively worsening.  She has had a cough that is nonproductive.  She has heard herself wheezing.  Patient reports she does not have a rescue inhaler at home but she but has been using her twice daily inhaler.  Patient reports that she has not had a fever or felt lightheaded.  She has had palpitations with exertion.  Patient reports she was not exerting herself when she checked her pulse ox today.  She reports she feels much better now than when she came in.  She is confused as to why her COPD would flare when she quit smoking 7 months ago.  We discussed other triggers of COPD flare including season change and allergies.  Patient has no other complaints at this time.  Patient does not smoke she drinks 1-2 beers daily and has never had withdrawals.  She does not use drugs.  She is vaccinated for COVID and she has had this years flu shot.  She is full code. Review of Systems: As mentioned in the history of present illness. All other systems reviewed and are negative. Past Medical History:  Diagnosis Date   Anxiety    Occasional   Arthritis    COPD (chronic obstructive pulmonary disease) (HCC)    HTN (hypertension)    Past Surgical History:  Procedure Laterality Date    COLONOSCOPY  02/23/04   RMR: normal rectum and colon.minimal internal hemorrhoids   COLONOSCOPY WITH PROPOFOL N/A 06/13/2015   Procedure: COLONOSCOPY WITH PROPOFOL;  Surgeon: Daneil Dolin, MD;  [facility];  Service: Endoscopy;  Laterality: N/A;  1045   POLYPECTOMY  06/13/2015   Procedure: POLYPECTOMY;  Surgeon: Daneil Dolin, MD;  Location: [facility];  Service: Endoscopy;;  sigmoid colon polyp   TOOTH EXTRACTION     TRIGGER FINGER RELEASE Bilateral    TUBAL LIGATION     Social History:  reports that she quit smoking about 6 months ago. Her smoking use included cigarettes. She has a 27.50 pack-year smoking history. She has quit using smokeless tobacco. She reports current alcohol use. She reports that she does not use drugs.  Allergies  Allergen Reactions   Demerol [Meperidine]    Oxycontin [Oxycodone Hcl]    Fish Allergy Nausea Only    Only fish from [place]    Family History  Problem Relation Age of Onset   Colon cancer Neg Hx     Prior to Admission medications   Medication Sig Start Date End Date Taking? Authorizing Provider  ALPRAZolam (XANAX) 0.25 MG tablet Take 0.25 mg by mouth daily as needed. 06/12/21   [provider]  Ascorbic Acid (VITAMIN C) 100 MG tablet Take 100 mg by mouth daily.  [provider]  furosemide (LASIX) 20 MG tablet Take 1 tablet (20 mg total) by mouth daily as needed for fluid or edema (shortness of breath). 07/25/19 07/24/20  Manuella Ghazi, Pratik D, DO  umeclidinium bromide (INCRUSE ELLIPTA) 62.5 MCG/INH AEPB Inhale 1 puff into the lungs daily.    [provider]  vitamin B-12 (CYANOCOBALAMIN) 100 MCG tablet Take 100 mcg by mouth daily.    [provider]    Physical Exam: Vitals:   12/23/21 2330 12/24/21 0030 12/24/21 0315 12/24/21 0400  BP: (!) 153/62 (!) 121/53 (!) 120/54 128/66  Pulse: 85 83 71 83  Resp: '20 18 16 18  '$ Temp: 98.3 F (36.8 C)  98.3 F (36.8 C) 98.3 F (36.8 C)  TempSrc: Oral   Oral   SpO2: 98% 100% 98% 99%  Weight:    52.3 kg  Height:    '5\' 1"'$  (1.549 m)   1.  General: Patient lying supine in bed,  no acute distress   2. Psychiatric: Alert and oriented x 3, mood and behavior normal for situation, pleasant and cooperative with exam   3. Neurologic: Speech and language are normal, face is symmetric, moves all 4 extremities voluntarily, at baseline without acute deficits on limited exam   4. HEENMT:  Head is atraumatic, normocephalic, pupils reactive to light, neck is supple, trachea is midline, mucous membranes are moist   5. Respiratory : Lungs are clear to auscultation bilaterally without wheezing, rhonchi, rales, no cyanosis, no increase in work of breathing or accessory muscle use, nasal cannula in place   6. Cardiovascular : Heart rate normal, rhythm is regular, no murmurs, rubs or gallops, no peripheral edema, peripheral pulses palpated   7. Gastrointestinal:  Abdomen is soft, nondistended, nontender to palpation bowel sounds active, no masses or organomegaly palpated   8. Skin:  Skin is warm, dry and intact without rashes, acute lesions, or ulcers on limited exam   9.Musculoskeletal:  No acute deformities or trauma, no asymmetry in tone, no peripheral edema, peripheral pulses palpated, no tenderness to palpation in the extremities  Data Reviewed: In the ED Temp 97.9, heart rate 80-100, respiratory rate 18-22, blood pressure 136/64-188/81, satting 93-100% on 2 L nasal cannula - Patient did drop down to 69% with ambulation on room air - Patient has a leukocytosis 12.5 - Chemistries unremarkable - Chest x-ray shows no acute disease - Patient was started on Solu-Medrol and Zithromax - Admission was requested for COPD exacerbation  Assessment and Plan: * Acute respiratory failure with hypoxia ([place]) - Oxygen sats dropped down to 69% with ambulation - Likely secondary to COPD exacerbation see the respective plan - Continue supplemental O2 -  Continue breathing treatments - Wean off as tolerated - Continue to monitor  Leukocytosis White blood cell count 12.5 - Could be demargination from steroid or reactive - Procalcitonin is undetectable so not likely bacterial infection - UA pending - Trend in the a.m.  Anxiety disorder - Continue Xanax  COPD with acute exacerbation (HCC) - Patient saturating at 69% with ambulation. - Normalized on 2 L nasal cannula - Continue Incruse and Dulera - Continue scheduled and as needed breathing treatments - Continue steroid - Chest x-ray shows no acute disease - Continue to monitor      Advance Care Planning:   Code Status: Full Code  Consults: None  Family Communication: No family at bedside  Severity of Illness: The appropriate patient status for this patient is INPATIENT. Inpatient status is judged to be reasonable  and necessary in order to provide the required intensity of service to ensure the patient's safety. The patient's presenting symptoms, physical exam findings, and initial radiographic and laboratory data in the context of their chronic comorbidities is felt to place them at high risk for further clinical deterioration. Furthermore, it is not anticipated that the patient will be medically stable for discharge from the hospital within 2 midnights of admission.   * I certify that at the point of admission it is my clinical judgment that the patient will require inpatient hospital care spanning beyond 2 midnights from the point of admission due to high intensity of service, high risk for further deterioration and high frequency of surveillance required.*  Author: Rolla Plate, DO 12/24/2021 5:48 AM  For on call review [email or web address].

## 2021-12-24 NOTE — Progress Notes (Signed)
Nsg Discharge Note  Admit Date:  12/23/2021 Discharge date: 12/24/2021   Desiree Barber to be D/C'd Home per MD order.  AVS completed.   Removed IV-CDI.Reviewed d/c paperwork with patient and husband. Wheeled stable patient and belongings, including oxygen tank,  to main entrance where she was picked up by her husband.Patient/caregiver able to verbalize understanding.  Discharge Medication: Allergies as of 12/24/2021       Reactions   Demerol [meperidine] Other (See Comments)   Unknown   Oxycontin [oxycodone Hcl] Other (See Comments)   Unknown   Fish Allergy Nausea Only   Only fish from [place]        Medication List     TAKE these medications    ALPRAZolam 0.25 MG tablet Commonly known as: XANAX Take 0.25 mg by mouth daily as needed for anxiety or sleep.   furosemide 20 MG tablet Commonly known as: Lasix Take 1 tablet (20 mg total) by mouth daily as needed for fluid or edema (shortness of breath).   Incruse Ellipta 62.5 MCG/ACT Aepb Generic drug: umeclidinium bromide Inhale 1 puff into the lungs in the morning and at bedtime.   vitamin B-12 100 MCG tablet Commonly known as: CYANOCOBALAMIN Take 100 mcg by mouth daily.   vitamin C 100 MG tablet Take 100 mg by mouth daily.               Durable Medical Equipment  (From admission, onward)           Start     Ordered   12/24/21 1239  For home use only DME oxygen  Once       Comments: POC for COPD  Question Answer Comment  Length of Need Lifetime   Mode or (Route) Nasal cannula   Liters per Minute 2   Frequency Continuous (stationary and portable oxygen unit needed)   Oxygen conserving device Yes   Oxygen delivery system Gas      12/24/21 1239            Discharge Assessment: Vitals:   12/24/21 0400 12/24/21 0724  BP: 128/66   Pulse: 83   Resp: 18   Temp: 98.3 F (36.8 C)   SpO2: 99% 92%   Skin clean, dry and intact without evidence of skin break down, no evidence of skin tears  noted. IV catheter discontinued intact. Site without signs and symptoms of complications - no redness or edema noted at insertion site, patient denies c/o pain - only slight tenderness at site.  Dressing with slight pressure applied.  D/c Instructions-Education: Discharge instructions given to patient/family with verbalized understanding. D/c education completed with patient/family including follow up instructions, medication list, d/c activities limitations if indicated, with other d/c instructions as indicated by MD - patient able to verbalize understanding, all questions fully answered. Patient instructed to return to ED, call 911, or call MD for any changes in condition.  Patient escorted via [place], and D/C home via private auto.  Santa Lighter, RN 12/24/2021 4:02 PM

## 2021-12-24 NOTE — Plan of Care (Signed)
  Problem: Education: Goal: Knowledge of disease or condition will improve 12/24/2021 1600 by Santa Lighter, RN Outcome: Adequate for Discharge 12/24/2021 [phone] by Santa Lighter, RN Outcome: Progressing Goal: Knowledge of the prescribed therapeutic regimen will improve Outcome: Adequate for Discharge Goal: Individualized Educational Video(s) Outcome: Adequate for Discharge   Problem: Respiratory: Goal: Ability to maintain a clear airway will improve Outcome: Adequate for Discharge Goal: Levels of oxygenation will improve Outcome: Adequate for Discharge Goal: Ability to maintain adequate ventilation will improve Outcome: Adequate for Discharge   Problem: Education: Goal: Knowledge of General Education information will improve Description: Including pain rating scale, medication(s)/side effects and non-pharmacologic comfort measures 12/24/2021 1600 by Santa Lighter, RN Outcome: Adequate for Discharge 12/24/2021 0843 by Santa Lighter, RN Outcome: Progressing   Problem: Health Behavior/Discharge Planning: Goal: Ability to manage health-related needs will improve Outcome: Adequate for Discharge   Problem: Clinical Measurements: Goal: Ability to maintain clinical measurements within normal limits will improve Outcome: Adequate for Discharge Goal: Will remain free from infection Outcome: Adequate for Discharge Goal: Diagnostic test results will improve Outcome: Adequate for Discharge Goal: Respiratory complications will improve Outcome: Adequate for Discharge Goal: Cardiovascular complication will be avoided Outcome: Adequate for Discharge   Problem: Activity: Goal: Risk for activity intolerance will decrease Outcome: Adequate for Discharge   Problem: Nutrition: Goal: Adequate nutrition will be maintained Outcome: Adequate for Discharge   Problem: Coping: Goal: Level of anxiety will decrease Outcome: Adequate for Discharge   Problem: Elimination: Goal: Will not  experience complications related to bowel motility Outcome: Adequate for Discharge Goal: Will not experience complications related to urinary retention Outcome: Adequate for Discharge   Problem: Pain Managment: Goal: General experience of comfort will improve Outcome: Adequate for Discharge   Problem: Safety: Goal: Ability to remain free from injury will improve Outcome: Adequate for Discharge   Problem: Skin Integrity: Goal: Risk for impaired skin integrity will decrease Outcome: Adequate for Discharge   Problem: Education: Goal: Knowledge of General Education information will improve Description: Including pain rating scale, medication(s)/side effects and non-pharmacologic comfort measures Outcome: Adequate for Discharge   Problem: Health Behavior/Discharge Planning: Goal: Ability to manage health-related needs will improve Outcome: Adequate for Discharge   Problem: Clinical Measurements: Goal: Ability to maintain clinical measurements within normal limits will improve Outcome: Adequate for Discharge Goal: Will remain free from infection Outcome: Adequate for Discharge Goal: Diagnostic test results will improve Outcome: Adequate for Discharge Goal: Respiratory complications will improve Outcome: Adequate for Discharge Goal: Cardiovascular complication will be avoided Outcome: Adequate for Discharge   Problem: Activity: Goal: Risk for activity intolerance will decrease Outcome: Adequate for Discharge   Problem: Nutrition: Goal: Adequate nutrition will be maintained Outcome: Adequate for Discharge   Problem: Coping: Goal: Level of anxiety will decrease Outcome: Adequate for Discharge   Problem: Elimination: Goal: Will not experience complications related to bowel motility Outcome: Adequate for Discharge Goal: Will not experience complications related to urinary retention Outcome: Adequate for Discharge   Problem: Pain Managment: Goal: General experience of  comfort will improve Outcome: Adequate for Discharge   Problem: Safety: Goal: Ability to remain free from injury will improve Outcome: Adequate for Discharge

## 2021-12-24 NOTE — Care Management Obs Status (Signed)
[facility] NOTIFICATION   Patient Details  Name: Desiree Barber MRN: [id number] Date of Birth: Dec 30, 1950   Medicare Observation Status Notification Given:  Yes    Shade Flood, LCSW 12/24/2021, 12:17 PM

## 2021-12-24 NOTE — Hospital Course (Signed)
71 y.o. female with medical history significant of anxiety, COPD, hypertension, presents to the ED with a chief complaint of shortness of breath.  Patient reports that she had shortness of breath for the last 10 days.  She has been checking her O2 sats with a pulse ox every day, and today it was down to 78% so she decided she needed to come into the ER.  She reports her shortness of breath has been constant and progressively worsening.  She has had a cough that is nonproductive.  She has heard herself wheezing.  Patient reports she does not have a rescue inhaler at home but she but has been using her twice daily inhaler.  Patient reports that she has not had a fever or felt lightheaded.  She has had palpitations with exertion.  Patient reports she was not exerting herself when she checked her pulse ox today.  She reports she feels much better now than when she came in.  She is confused as to why her COPD would flare when she quit smoking 7 months ago.  We discussed other triggers of COPD flare including season change and allergies.  Patient has no other complaints at this time.   Patient does not smoke she drinks 1-2 beers daily and has never had withdrawals.  She does not use drugs.  She is vaccinated for COVID and she has had this years flu shot.  She is full code.

## 2021-12-24 NOTE — Plan of Care (Signed)
  Problem: Education: Goal: Knowledge of disease or condition will improve Outcome: Progressing   Problem: Activity: Goal: Ability to tolerate increased activity will improve Outcome: Progressing   Problem: Education: Goal: Knowledge of General Education information will improve Description: Including pain rating scale, medication(s)/side effects and non-pharmacologic comfort measures Outcome: Progressing   

## 2021-12-24 NOTE — Care Management CC44 (Signed)
Condition Code 44 Documentation Completed  Patient Details  Name: Desiree Barber MRN: [id number] Date of Birth: 1950-07-06   Condition Code 44 given:  Yes Patient signature on Condition Code 44 notice:  Yes Documentation of 2 MD's agreement:  Yes Code 44 added to claim:  Yes    Shade Flood, LCSW 12/24/2021, 12:17 PM

## 2021-12-24 NOTE — Progress Notes (Signed)
SATURATION QUALIFICATIONS: (This note is used to comply with regulatory documentation for home oxygen)  Patient Saturations on Room Air at Rest = 88%  Patient Saturations on Room Air while Ambulating = ?  Patient Saturations on 2 Liters of oxygen while Ambulating = 90%  Please briefly explain why patient needs home oxygen: I did not walk her on RA because she was satting at 88% on RA

## 2021-12-24 NOTE — TOC Transition Note (Signed)
Transition of Care [facility]) - CM/SW Discharge Note   Patient Details  Name: Desiree Barber MRN: [id number] Date of Birth: 04-20-1950  Transition of Care [facility]) CM/SW Contact:  Shade Flood, LCSW Phone Number: 12/24/2021, 12:14 PM   Clinical Narrative:      Pt stable for dc today with Home O2 per MD. Desiree Barber with pt to review dc planning. O2 CMS provider options reviewed. Referred to [facility] as requested. Portable tank will be brought to pt's room by [facility]. Desiree Barber at [place] will update on call staff at [facility] of need for afternoon delivery to pt's home. Pt aware.  There are no other TOC needs for dc.  Expected Discharge Plan: Home/Self Care Barriers to Discharge: Barriers Resolved   Patient Goals and CMS Choice Patient states their goals for this hospitalization and ongoing recovery are:: go home CMS Medicare.gov Compare Post Acute Care list provided to:: Patient Choice offered to / list presented to : Patient  Expected Discharge Plan and Services Expected Discharge Plan: Home/Self Care In-house Referral: Clinical Social Work   Post Acute Care Choice: Durable Medical Equipment Living arrangements for the past 2 months: [name] Expected Discharge Date: 12/24/21               DME Arranged: Oxygen DME Agency: [facility] Date DME Agency Contacted: 12/24/21   Representative spoke with at DME Agency: Desiree Barber            Prior Living Arrangements/Services Living arrangements for the past 2 months: [name] Lives with:: Spouse Patient language and need for interpreter reviewed:: Yes Do you feel safe going back to the place where you live?: Yes      Need for Family Participation in Patient Care: No (Comment)     Criminal Activity/Legal Involvement Pertinent to Current Situation/Hospitalization: No - Comment as needed  Activities of Daily Living Home Assistive Devices/Equipment: None ADL Screening (condition at time of admission) Patient's cognitive  ability adequate to safely complete daily activities?: Yes Is the patient deaf or have difficulty hearing?: No Does the patient have difficulty seeing, even when wearing glasses/contacts?: No Does the patient have difficulty concentrating, remembering, or making decisions?: No Patient able to express need for assistance with ADLs?: Yes Does the patient have difficulty dressing or bathing?: No Independently performs ADLs?: Yes (appropriate for developmental age) Does the patient have difficulty walking or climbing stairs?: No Weakness of Legs: None Weakness of Arms/Hands: None  Permission Sought/Granted Permission sought to share information with : Facility Art therapist granted to share information with : Yes, Verbal Permission Granted     Permission granted to share info w AGENCY: [employer]        Emotional Assessment   Attitude/Demeanor/Rapport: Engaged Affect (typically observed): Pleasant Orientation: : Oriented to Self, Oriented to Place, Oriented to  Time, Oriented to Situation Alcohol / Substance Use: Not Applicable Psych Involvement: No (comment)  Admission diagnosis:  COPD exacerbation ([place]) [J44.1] Acute respiratory failure with hypoxia ([place]) [J96.01] COPD with acute exacerbation ([place]) [J44.1] Patient Active Problem List   Diagnosis Date Noted   Leukocytosis 12/24/2021   Smoker 12/23/2021   COPD with acute exacerbation ([place]) 06/13/2021   Former smoker 06/13/2021   Acute respiratory failure with hypoxia ([place]) 07/25/2019   HTN (hypertension) 07/25/2019   Post-operative complication 44/03/270   Anemia 07/25/2019   Dependence on supplemental oxygen 07/24/2019   Anxiety disorder 03/27/2019   History of colonic polyps    Diverticulosis of colon without hemorrhage    Encounter  for screening colonoscopy 05/18/2015   PCP:  Sharilyn Sites, MD Pharmacy:   [facility], [address] [place]  [address] Phone: [phone] Fax: [phone]     Social Determinants of Health (SDOH) Interventions    Readmission Risk Interventions     No data to display            Final next level of care: Home/Self Care Barriers to Discharge: Barriers Resolved   Patient Goals and CMS Choice Patient states their goals for this hospitalization and ongoing recovery are:: go home CMS Medicare.gov Compare Post Acute Care list provided to:: Patient Choice offered to / list presented to : Patient  Discharge Placement                       Discharge Plan and Services In-house Referral: Clinical Social Work   Post Acute Care Choice: Durable Medical Equipment          DME Arranged: Oxygen DME Agency: [facility] Date DME Agency Contacted: 12/24/21   Representative spoke with at DME Agency: Desiree Barber            Social Determinants of Health ([place]) Interventions     Readmission Risk Interventions     No data to display

## 2021-12-24 NOTE — Assessment & Plan Note (Addendum)
-   Patient desaturated to 69% with ambulation. - Normalized on 2 L nasal cannula - Continue Incruse and Dulera - Pt was treated with scheduled and as needed breathing treatments - treated with IV steroid  Pt requested to discharge home today. She has an Rx filled from her urgent care visit for azithromycin and prednisone taper.  I told her to finish that prescription.  We made arrangements for home oxygen for her.  She will DC home today.  Outpatient follow up with PCP recommended.

## 2021-12-24 NOTE — Assessment & Plan Note (Addendum)
-   Oxygen sats dropped down to 69% with ambulation - Secondary to COPD exacerbation see the respective plan - Continue supplemental O2 --> Pt qualifies for  Home oxygen  - Continue bronchodilators

## 2021-12-24 NOTE — Assessment & Plan Note (Addendum)
White blood cell count 12.5 - steroid induced

## 2021-12-25 ENCOUNTER — Ambulatory Visit (HOSPITAL_COMMUNITY)
Admission: RE | Admit: 2021-12-25 | Discharge: 2021-12-25 | Disposition: A | Discharge status: Home / Self Care | Payer: Medicare Other | Source: Ambulatory Visit | Attending: Family Medicine | Admitting: Family Medicine

## 2021-12-25 DIAGNOSIS — M19031 Primary osteoarthritis, right wrist: Secondary | ICD-10-CM | POA: Diagnosis not present

## 2021-12-25 DIAGNOSIS — Z872 Personal history of diseases of the skin and subcutaneous tissue: Secondary | ICD-10-CM | POA: Diagnosis not present

## 2021-12-25 DIAGNOSIS — M79601 Pain in right arm: Secondary | ICD-10-CM | POA: Insufficient documentation

## 2021-12-28 ENCOUNTER — Ambulatory Visit (HOSPITAL_COMMUNITY)
Admission: RE | Admit: 2021-12-28 | Discharge: 2021-12-28 | Disposition: A | Discharge status: Home / Self Care | Payer: Medicare Other | Source: Ambulatory Visit | Attending: Acute Care | Admitting: Acute Care

## 2021-12-28 DIAGNOSIS — Z87891 Personal history of nicotine dependence: Secondary | ICD-10-CM | POA: Diagnosis not present

## 2021-12-28 DIAGNOSIS — R911 Solitary pulmonary nodule: Secondary | ICD-10-CM | POA: Diagnosis not present

## 2021-12-29 DIAGNOSIS — Z6821 Body mass index (BMI) 21.0-21.9, adult: Secondary | ICD-10-CM | POA: Diagnosis not present

## 2021-12-29 DIAGNOSIS — J9611 Chronic respiratory failure with hypoxia: Secondary | ICD-10-CM | POA: Diagnosis not present

## 2021-12-29 DIAGNOSIS — J449 Chronic obstructive pulmonary disease, unspecified: Secondary | ICD-10-CM | POA: Diagnosis not present

## 2022-01-05 ENCOUNTER — Encounter: Payer: Self-pay | Admitting: Internal Medicine

## 2022-01-05 ENCOUNTER — Encounter: Payer: Self-pay | Admitting: Hematology

## 2022-01-05 ENCOUNTER — Ambulatory Visit (INDEPENDENT_AMBULATORY_CARE_PROVIDER_SITE_OTHER): Payer: Medicare Other | Admitting: Internal Medicine

## 2022-01-05 ENCOUNTER — Inpatient Hospital Stay: Payer: Medicare Other | Attending: Hematology | Admitting: Hematology

## 2022-01-05 VITALS — BP 128/58 | HR 78 | Temp 98.2°F | Ht 64.5 in | Wt 119.4 lb

## 2022-01-05 VITALS — BP 153/74 | HR 80 | Temp 98.0°F | Resp 16 | Ht 64.5 in | Wt 119.8 lb

## 2022-01-05 DIAGNOSIS — D72829 Elevated white blood cell count, unspecified: Secondary | ICD-10-CM | POA: Insufficient documentation

## 2022-01-05 DIAGNOSIS — M8580 Other specified disorders of bone density and structure, unspecified site: Secondary | ICD-10-CM | POA: Insufficient documentation

## 2022-01-05 DIAGNOSIS — Z885 Allergy status to narcotic agent status: Secondary | ICD-10-CM | POA: Insufficient documentation

## 2022-01-05 DIAGNOSIS — J9611 Chronic respiratory failure with hypoxia: Secondary | ICD-10-CM

## 2022-01-05 DIAGNOSIS — Z79899 Other long term (current) drug therapy: Secondary | ICD-10-CM | POA: Insufficient documentation

## 2022-01-05 DIAGNOSIS — Z8719 Personal history of other diseases of the digestive system: Secondary | ICD-10-CM | POA: Diagnosis not present

## 2022-01-05 DIAGNOSIS — J479 Bronchiectasis, uncomplicated: Secondary | ICD-10-CM | POA: Insufficient documentation

## 2022-01-05 DIAGNOSIS — J449 Chronic obstructive pulmonary disease, unspecified: Secondary | ICD-10-CM | POA: Diagnosis not present

## 2022-01-05 DIAGNOSIS — D649 Anemia, unspecified: Secondary | ICD-10-CM

## 2022-01-05 DIAGNOSIS — D75839 Thrombocytosis, unspecified: Secondary | ICD-10-CM | POA: Diagnosis not present

## 2022-01-05 DIAGNOSIS — I2584 Coronary atherosclerosis due to calcified coronary lesion: Secondary | ICD-10-CM

## 2022-01-05 DIAGNOSIS — J432 Centrilobular emphysema: Secondary | ICD-10-CM | POA: Insufficient documentation

## 2022-01-05 DIAGNOSIS — J441 Chronic obstructive pulmonary disease with (acute) exacerbation: Secondary | ICD-10-CM

## 2022-01-05 DIAGNOSIS — Z87891 Personal history of nicotine dependence: Secondary | ICD-10-CM | POA: Insufficient documentation

## 2022-01-05 DIAGNOSIS — Z801 Family history of malignant neoplasm of trachea, bronchus and lung: Secondary | ICD-10-CM | POA: Diagnosis not present

## 2022-01-05 DIAGNOSIS — I2510 Atherosclerotic heart disease of native coronary artery without angina pectoris: Secondary | ICD-10-CM | POA: Diagnosis not present

## 2022-01-05 DIAGNOSIS — K449 Diaphragmatic hernia without obstruction or gangrene: Secondary | ICD-10-CM | POA: Insufficient documentation

## 2022-01-05 MED ORDER — ANORO ELLIPTA 62.5-25 MCG/ACT IN AEPB: 1.0000 | INHALATION_SPRAY | Freq: Every day | RESPIRATORY_TRACT | 11 refills | Status: DC

## 2022-01-05 NOTE — Progress Notes (Signed)
CONSULT NOTE  Patient Care Team: Sharilyn Sites, MD as PCP - General (Family Medicine) Gala Romney Cristopher Estimable, MD as Consulting Physician (Gastroenterology) Derek Jack, MD as Medical Oncologist (Hematology)  CHIEF COMPLAINTS/PURPOSE OF CONSULTATION:  Leukocytosis and thrombocytosis.  HISTORY OF PRESENTING ILLNESS:  Desiree Barber 71 y.o. female is seen in consultation today at the request of Dr. Hilma Favors for further work-up and management of leukocytosis and thrombocytosis.  CBC on 12/21/2021 showed white count elevated at 13.3 with 74% neutrophils, 14% lymphocytes, 8% monocytes, 2% eosinophils and 1% basophils.  Hemoglobin was 10.7 with MCV 84.  Platelet count was elevated at 511.  She was admitted to the hospital from 12/23/2021 through 12/24/2021 due to COPD exacerbation and possible infection.  She was treated with steroids and antibiotics.  CBC on 12/24/2021 showed white count normalized to 10.5 but still with elevated absolute neutrophil count.  She has completed prednisone course on 01/03/2022.  She also uses Trelegy inhaler daily.  She reportedly quit smoking on 05/29/2021 and has gained about 22 pounds since then.  Denies any fevers or night sweats.  No recurrent infections.  Denies any prior history of thrombosis.  No aquagenic pruritus or vasomotor symptoms.    MEDICAL HISTORY:  Past Medical History:  Diagnosis Date   Anxiety    Occasional   Arthritis    COPD (chronic obstructive pulmonary disease) (HCC)    HTN (hypertension)     SURGICAL HISTORY: Past Surgical History:  Procedure Laterality Date   COLONOSCOPY  02/23/04   RMR: normal rectum and colon.minimal internal hemorrhoids   COLONOSCOPY WITH PROPOFOL N/A 06/13/2015   Procedure: COLONOSCOPY WITH PROPOFOL;  Surgeon: Daneil Dolin, MD;  [facility];  Service: Endoscopy;  Laterality: N/A;  1045   POLYPECTOMY  06/13/2015   Procedure: POLYPECTOMY;  Surgeon: Daneil Dolin, MD;  [facility]: [facility];   Service: Endoscopy;;  sigmoid colon polyp   TOOTH EXTRACTION     TRIGGER FINGER RELEASE Bilateral    TUBAL LIGATION      SOCIAL HISTORY: Social History   Socioeconomic History   Marital status: Married    Spouse name: Not on file   Number of children: Not on file   Years of education: Not on file   Highest education level: Not on file  Occupational History   Not on file  Tobacco Use   Smoking status: Former    Packs/day: 0.50    Years: 55.00    Total pack years: 27.50    Types: Cigarettes    Quit date: 05/29/2021    Years since quitting: 0.6   Smokeless tobacco: Former   Tobacco comments:    Quit date 05/29/2021  Vaping Use   Vaping Use: Never used  Substance and Sexual Activity   Alcohol use: Yes    Alcohol/week: 0.0 standard drinks of alcohol    Comment: 3 beers a day   Drug use: No   Sexual activity: Yes    Birth control/protection: Surgical  Other Topics Concern   Not on file  Social History Narrative   Not on file   Social Determinants of Health   Financial Resource Strain: Not on file  Food Insecurity: Unknown (12/24/2021)   Hunger Vital Sign    Worried About Running Out of Food in the Last Year: Never true    Ran Out of Food in the Last Year: Not on file  Transportation Needs: No Transportation Needs (12/24/2021)   [place] - Transportation  Lack of Transportation (Medical): No    Lack of Transportation (Non-Medical): No  Physical Activity: Not on file  Stress: Not on file  Social Connections: Not on file  Intimate Partner Violence: Not At Risk (12/24/2021)   Humiliation, Afraid, Rape, and Kick questionnaire    Fear of Current or Ex-Partner: No    Emotionally Abused: No    Physically Abused: No    Sexually Abused: No    FAMILY HISTORY: Family History  Problem Relation Age of Onset   Colon cancer Neg Hx     ALLERGIES:  is allergic to demerol [meperidine], oxycontin [oxycodone hcl], and fish allergy.  MEDICATIONS:  Current Outpatient Medications   Medication Sig Dispense Refill   ALPRAZolam (XANAX) 0.25 MG tablet Take 0.25 mg by mouth daily as needed for anxiety or sleep.     Ascorbic Acid (VITAMIN C) 100 MG tablet Take 100 mg by mouth daily.     TRELEGY ELLIPTA 100-62.5-25 MCG/ACT AEPB Inhale 1 puff into the lungs daily.     vitamin B-12 (CYANOCOBALAMIN) 100 MCG tablet Take 100 mcg by mouth daily.     No current facility-administered medications for this visit.    REVIEW OF SYSTEMS:   Constitutional: Denies fevers, chills or abnormal night sweats Eyes: Denies blurriness of vision, double vision or watery eyes Ears, nose, mouth, throat, and face: Denies mucositis or sore throat Respiratory: Positive for shortness of breath on exertion. Cardiovascular: Denies palpitation, chest discomfort or lower extremity swelling Gastrointestinal:  Denies nausea, heartburn or change in bowel habits Skin: Denies abnormal skin rashes Lymphatics: Denies new lymphadenopathy or easy bruising Neurological:Denies numbness, tingling or new weaknesses Behavioral/Psych: Positive for anxiety and difficulty sleeping. All other systems were reviewed with the patient and are negative.  PHYSICAL EXAMINATION: ECOG PERFORMANCE STATUS: 1 - Symptomatic but completely ambulatory  Vitals:   01/05/22 0822  BP: (!) 153/74  Pulse: 80  Resp: 16  Temp: 98 F (36.7 C)  SpO2: 95%   Filed Weights   01/05/22 0822  Weight: 119 lb 12.8 oz (54.3 kg)    GENERAL:alert, no distress and comfortable SKIN: skin color, texture, turgor are normal, no rashes or significant lesions EYES: normal, conjunctiva are pink and non-injected, sclera clear OROPHARYNX:no exudate, no erythema and lips, buccal mucosa, and tongue normal  NECK: supple, thyroid normal size, non-tender, without nodularity LYMPH:  no palpable lymphadenopathy in the cervical, axillary or inguinal LUNGS: clear to auscultation and percussion with normal breathing effort HEART: regular rate & rhythm and no  murmurs and no lower extremity edema ABDOMEN:abdomen soft, non-tender and normal bowel sounds Musculoskeletal:no cyanosis of digits and no clubbing  PSYCH: alert & oriented x 3 with fluent speech NEURO: no focal motor/sensory deficits  LABORATORY DATA:  I have reviewed the data as listed Recent Results (from the past 2160 hour(s))  Procalcitonin - Baseline     Status: None   Collection Time: 12/22/21  8:35 PM  Result Value Ref Range   Procalcitonin <0.10 ng/mL    Comment:        Interpretation: PCT (Procalcitonin) <= 0.5 ng/mL: Systemic infection (sepsis) is not likely. Local bacterial infection is possible. (NOTE)       Sepsis PCT Algorithm           Lower Respiratory Tract  Infection PCT Algorithm    ----------------------------     ----------------------------         PCT < 0.25 ng/mL                PCT < 0.10 ng/mL          Strongly encourage             Strongly discourage   discontinuation of antibiotics    initiation of antibiotics    ----------------------------     -----------------------------       PCT 0.25 - 0.50 ng/mL            PCT 0.10 - 0.25 ng/mL               OR       >80% decrease in PCT            Discourage initiation of                                            antibiotics      Encourage discontinuation           of antibiotics    ----------------------------     -----------------------------         PCT >= 0.50 ng/mL              PCT 0.26 - 0.50 ng/mL               AND        <80% decrease in PCT             Encourage initiation of                                             antibiotics       Encourage continuation           of antibiotics    ----------------------------     -----------------------------        PCT >= 0.50 ng/mL                  PCT > 0.50 ng/mL               AND         increase in PCT                  Strongly encourage                                      initiation of antibiotics    Strongly  encourage escalation           of antibiotics                                     -----------------------------                                           PCT <= 0.25 ng/mL  OR                                        > 80% decrease in PCT                                      Discontinue / Do not initiate                                             antibiotics  Performed at [facility], [address] [place], [place] [address]   CBC with Differential     Status: Abnormal   Collection Time: 12/23/21  8:40 PM  Result Value Ref Range   WBC 12.5 (H) 4.0 - 10.5 K/uL   RBC 4.42 3.87 - 5.11 MIL/uL   Hemoglobin 11.1 (L) 12.0 - 15.0 g/dL   HCT 38.1 36.0 - 46.0 %   MCV 86.2 80.0 - 100.0 fL   MCH 25.1 (L) 26.0 - 34.0 pg   MCHC 29.1 (L) 30.0 - 36.0 g/dL   RDW 16.5 (H) 11.5 - 15.5 %   Platelets 458 (H) 150 - 400 K/uL   nRBC 0.0 0.0 - 0.2 %   Neutrophils Relative % 74 %   Neutro Abs 9.2 (H) 1.7 - 7.7 K/uL   Lymphocytes Relative 14 %   Lymphs Abs 1.8 0.7 - 4.0 K/uL   Monocytes Relative 10 %   Monocytes Absolute 1.2 (H) 0.1 - 1.0 K/uL   Eosinophils Relative 1 %   Eosinophils Absolute 0.1 0.0 - 0.5 K/uL   Basophils Relative 1 %   Basophils Absolute 0.1 0.0 - 0.1 K/uL   Immature Granulocytes 0 %   Abs Immature Granulocytes 0.05 0.00 - 0.07 K/uL    Comment: Performed at [facility], [address], [place], [place] [address]  Basic metabolic panel     Status: Abnormal   Collection Time: 12/23/21  8:40 PM  Result Value Ref Range   Sodium 138 135 - 145 mmol/L   Potassium 3.5 3.5 - 5.1 mmol/L   Chloride 100 98 - 111 mmol/L   CO2 29 22 - 32 mmol/L   Glucose, Bld 109 (H) 70 - 99 mg/dL    Comment: Glucose reference range applies only to samples taken after fasting for at least 8 hours.   BUN 10 8 - 23 mg/dL   Creatinine, Ser 0.57 0.44 - 1.00 mg/dL   Calcium 9.1 8.9 - 10.3 mg/dL   GFR, Estimated >60 >60 mL/min    Comment: (NOTE) Calculated using  the CKD-EPI Creatinine Equation (2021)    Anion gap 9 5 - 15    Comment: Performed at [facility], [address], [place], [place] [address]  Brain natriuretic peptide     Status: None   Collection Time: 12/23/21  8:40 PM  Result Value Ref Range   B Natriuretic Peptide 57.0 0.0 - 100.0 pg/mL    Comment: Performed at [facility], [address], [place], [place] [address]  SARS Coronavirus 2 by RT PCR (hospital order, performed in [facility] hospital lab) *cepheid single result test* Anterior Nasal Swab     Status: None   Collection Time: 12/24/21 12:05 AM  Specimen: Anterior Nasal Swab  Result Value Ref Range   SARS Coronavirus 2 by RT PCR NEGATIVE NEGATIVE    Comment: (NOTE) SARS-CoV-2 target nucleic acids are NOT DETECTED.  The SARS-CoV-2 RNA is generally detectable in upper and lower respiratory specimens during the acute phase of infection. The lowest concentration of SARS-CoV-2 viral copies this assay can detect is 250 copies / mL. A negative result does not preclude SARS-CoV-2 infection and should not be used as the sole basis for treatment or other patient management decisions.  A negative result may occur with improper specimen collection / handling, submission of specimen other than nasopharyngeal swab, presence of viral mutation(s) within the areas targeted by this assay, and inadequate number of viral copies (<250 copies / mL). A negative result must be combined with clinical observations, patient history, and epidemiological information.  Fact Sheet for Patients:   https://www.patel.info/  Fact Sheet for Healthcare Providers: https://hall.com/  This test is not yet approved or  cleared by the [place] FDA and has been authorized for detection and/or diagnosis of SARS-CoV-2 by FDA under an Emergency Use Authorization (EUA).  This EUA will remain in effect (meaning this test can be used) for the duration of  the COVID-19 declaration under Section 564(b)(1) of the Act, 21 U.S.C. section 360bbb-3(b)(1), unless the authorization is terminated or revoked sooner.  Performed at [facility], [address] [place], [place] [address]   Comprehensive metabolic panel     Status: Abnormal   Collection Time: 12/24/21  4:54 AM  Result Value Ref Range   Sodium 137 135 - 145 mmol/L   Potassium 4.1 3.5 - 5.1 mmol/L   Chloride 104 98 - 111 mmol/L   CO2 28 22 - 32 mmol/L   Glucose, Bld 140 (H) 70 - 99 mg/dL    Comment: Glucose reference range applies only to samples taken after fasting for at least 8 hours.   BUN 7 (L) 8 - 23 mg/dL   Creatinine, Ser 0.66 0.44 - 1.00 mg/dL   Calcium 8.3 (L) 8.9 - 10.3 mg/dL   Total Protein 6.8 6.5 - 8.1 g/dL   Albumin 3.3 (L) 3.5 - 5.0 g/dL   AST 22 15 - 41 U/L   ALT 20 0 - 44 U/L   Alkaline Phosphatase 107 38 - 126 U/L   Total Bilirubin 0.6 0.3 - 1.2 mg/dL   GFR, Estimated >60 >60 mL/min    Comment: (NOTE) Calculated using the CKD-EPI Creatinine Equation (2021)    Anion gap 5 5 - 15    Comment: Performed at [facility], [place] [address] [place], [place] [address]  Magnesium     Status: None   Collection Time: 12/24/21  4:54 AM  Result Value Ref Range   Magnesium 2.2 1.7 - 2.4 mg/dL    Comment: Performed at [facility] [address] [place], [place] [address]  CBC with Differential/Platelet     Status: Abnormal   Collection Time: 12/24/21  4:54 AM  Result Value Ref Range   WBC 10.5 4.0 - 10.5 K/uL   RBC 4.28 3.87 - 5.11 MIL/uL   Hemoglobin 10.6 (L) 12.0 - 15.0 g/dL   HCT 37.2 36.0 - 46.0 %   MCV 86.9 80.0 - 100.0 fL   MCH 24.8 (L) 26.0 - 34.0 pg   MCHC 28.5 (L) 30.0 - 36.0 g/dL   RDW 16.5 (H) 11.5 - 15.5 %   Platelets 451 (H) 150 - 400 K/uL  nRBC 0.0 0.0 - 0.2 %   Neutrophils Relative % 93 %   Neutro Abs 9.8 (H) 1.7 - 7.7 K/uL   Lymphocytes Relative 5 %   Lymphs Abs 0.6 (L) 0.7 - 4.0 K/uL   Monocytes Relative 1 %   Monocytes Absolute  0.1 0.1 - 1.0 K/uL   Eosinophils Relative 0 %   Eosinophils Absolute 0.0 0.0 - 0.5 K/uL   Basophils Relative 0 %   Basophils Absolute 0.0 0.0 - 0.1 K/uL   Immature Granulocytes 1 %   Abs Immature Granulocytes 0.06 0.00 - 0.07 K/uL    Comment: Performed at [facility], [address] [place], [place] [address]    RADIOGRAPHIC STUDIES: I have personally reviewed the radiological images as listed and agreed with the findings in the report. CT CHEST LCS NODULE F/U LOW DOSE WO CONTRAST  Result Date: 12/29/2021 CLINICAL DATA:  Former smoker with 55 pack-year history EXAM: CT CHEST WITHOUT CONTRAST FOR LUNG CANCER SCREENING NODULE FOLLOW-UP TECHNIQUE: Multidetector CT imaging of the chest was performed following the standard protocol without IV contrast. RADIATION DOSE REDUCTION: This exam was performed according to the departmental dose-optimization program which includes automated exposure control, adjustment of the mA and/or kV according to patient size and/or use of iterative reconstruction technique. COMPARISON:  Lung cancer screening CT dated June 19th 2023 FINDINGS: Cardiovascular: Normal heart size. No pericardial effusion. Severe three-vessel coronary artery calcifications. Normal caliber thoracic aorta with severe calcified plaque. Mediastinum/Nodes: Small hiatal hernia. Thyroid is unremarkable. No pathologically enlarged nodes seen in the chest. Lungs/Pleura: Bilateral bronchiectasis and scattered areas of mucous plugging. Moderate centrilobular emphysema. New consolidation of the lingula. Previously seen large nodule of the right lower measuring 14.9 mm on image 280 is stable. Other nodules are resolved, decreased in size or stable. Upper Abdomen: No acute abnormality. Musculoskeletal: No chest wall mass or suspicious bone lesions identified. IMPRESSION: 1. New consolidation of the lingula, likely due to worsened chronic atypical infection. Recommend follow-up chest CT in 3 months to ensure  resolution. Lung-RADS 0, incomplete. Additional lung cancer screening CT images/or comparison to prior chest CT examinations is needed. 2. Previously seen nodule of the right lower is stable. Other nodules are resolved, decreased in size or stable. 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 3. Severe three-vessel coronary artery calcifications. 4. Aortic Atherosclerosis (ICD10-I70.0) and Emphysema (ICD10-J43.9). Electronically Signed   By: Yetta Glassman M.D.   On: 12/29/2021 17:07   DG Forearm Right  Result Date: 12/26/2021 CLINICAL DATA:  Right posterior forearm itching and redness. EXAM: RIGHT FOREARM - 2 VIEW COMPARISON:  None Available. FINDINGS: There is no evidence of fracture or dislocation. Chronic deformity of the ulnar styloid is noted. Probable fibrocystic degenerative joint changes are identified in the carpal bones. Osteopenia of the distal ulna and radius are identified. IMPRESSION: No acute fracture or dislocation. Degenerative joint changes of right wrist. Electronically Signed   By: Abelardo Diesel M.D.   On: 12/26/2021 14:21   DG Chest 2 View  Result Date: 12/23/2021 CLINICAL DATA:  Short of breath.  Low oxygen saturation. EXAM: CHEST - 2 VIEW COMPARISON:  08/09/2020. FINDINGS: Cardiac silhouette normal in size. No mediastinal or hilar masses or evidence of adenopathy. Lungs are hyperexpanded. There are thickened interstitial markings as well as additional reticulonodular type opacities, stable. No lung consolidation. No convincing edema. No pleural effusion or pneumothorax. Skeletal structures are intact. IMPRESSION: 1. No acute cardiopulmonary disease. 2. Chronic  lung findings as detailed, without convincing change compared to the lung cancer screening chest CT dated 09/11/2021. Electronically Signed   By: Lajean Manes M.D.   On: 12/23/2021 16:14    ASSESSMENT:  1.  Leukocytosis and thrombocytosis: -  Patient seen at the request of Dr. Hilma Favors for elevated white count and platelet count. - CBC (12/21/2021): WBC-13.3 (74% N, 14% L, 8% M, 2% E, 1% B), Hb-10.7, MCV-84, PLT-511. - She was recently admitted to the hospital from 12/23/2021 through 12/24/2021, treated with steroids and antibiotics for possible COPD exacerbation/infection. - She completed prednisone taper on 01/03/2022.  She uses Trelegy inhaler daily. - Denies any fevers or night sweats.  Gained 22 pounds since she quit smoking.  No recurrent infections.  No vasomotor symptoms/aquagenic pruritus/prior thrombosis.  2.  Social/family history: - She lives at home with her husband.  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  Quit smoking on 05/29/2021.  Smoked 1 pack/day for 55 years. - No family history of leukemia.  Maternal uncle had lung cancer and was smoker.  PLAN:  1.  Leukocytosis and thrombocytosis: - We have reviewed differential diagnosis of leukocytosis including both reactive to and clonal causes. - Recommend repeating CBC next week as she just finished prednisone taper on 01/03/2022. - Leukocytosis can be seen in patients who use steroid inhalers like Trelegy on a regular basis. - She has been anemic for the last few times.  Will check ferritin and iron panel, E99 and folic acid.  Thrombocytosis can be explained if her ferritin is low.  If platelet count elevated despite normal ferritin levels, consider checking for myeloproliferative neoplasms.  2.  Smoking history: - She smoked 1 pack/day for 55 years. - We will talk to her about lung cancer screening scan at next visit.    All questions were answered. The patient knows to call the clinic with any problems, questions or concerns.      Derek Jack, MD 01/05/22 11:52 AM

## 2022-01-05 NOTE — Patient Instructions (Addendum)
Desiree Barber  Discharge Instructions  You were seen and examined today by Dr. Delton Coombes. Dr. Delton Coombes is a hematologist, meaning that he specializes in blood abnormalities. Dr. Delton Coombes discussed your past medical history, family history of cancers/blood conditions and the events that led to you being here today.  You were referred to Dr. Delton Coombes due to abnormal blood counts. Dr. Delton Coombes has recommended additional labs the week of the 23rd for further evaluation.  Follow-up as scheduled.  Thank you for choosing [facility] to provide your oncology and hematology care.   To afford each patient quality time with our provider, please arrive at least 15 minutes before your scheduled appointment time. You may need to reschedule your appointment if you arrive late (10 or more minutes). Arriving late affects you and other patients whose appointments are after yours.  Also, if you miss three or more appointments without notifying the office, you may be dismissed from the clinic at the provider's discretion.    Again, thank you for choosing [facility].  Our hope is that these requests will decrease the amount of time that you wait before being seen by our physicians.   If you have a lab appointment with the [facility] please come in thru the Main Entrance and check in at the main information desk.           _____________________________________________________________  Should you have questions after your visit to [facility], please contact our office at [phone] and follow the prompts.  Our office hours are 8:00 a.m. to 4:30 p.m. Monday - Thursday and 8:00 a.m. to 2:30 p.m. Friday.  Please note that voicemails left after 4:00 p.m. may not be returned until the following business day.  We are closed weekends and all major holidays.  You do have access to a nurse 24-7, just call the main number to the  clinic [phone] and do not press any options, hold on the line and a nurse will answer the phone.    For prescription refill requests, have your pharmacy contact our office and allow 72 hours.    Masks are optional in the cancer centers. If you would like for your care team to wear a mask while they are taking care of you, please let them know. You may have one support person who is at least 71 years old accompany you for your appointments.

## 2022-01-05 NOTE — Patient Instructions (Addendum)
Bronchiectasis =   you have scarring of your bronchial tubes which means that they don't function perfectly normally and mucus tends to pool in certain areas of your lung which can cause pneumonia and further scarring of your lung and bronchial tubes  For cough /congestion > mucinex  1200 mg every 12 hours and use the flutter valve as much as possible to help bring mucus up prior to your next ct to clear out the lingula   Make sure you check your oxygen saturation  AT  your highest level of activity (not after you stop)   to be sure it stays over 90% and adjust  02 flow upward to maintain this level if needed but remember to turn it back to previous settings when you stop (to conserve your supply).   My office will be contacting you by phone for referral to [facility] cardiology ( [facility])   - if you don't hear back from my office within one week please call us back or notify us thru MyChart and we'll address it right away.    Please schedule a follow up office visit in 6 weeks, call sooner if needed

## 2022-01-05 NOTE — Progress Notes (Unsigned)
Desiree Barber, female    DOB: 06/10/1950,    MRN: [id number]   Brief patient profile:  38  yowf  quit smoking 05/29/21 with doe/cough which improved  referred to pulmonary clinic in [place]  06/13/2021 by Collene Mares PA for ? 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      History of Present Illness  06/13/2021  Pulmonary/ 1st office eval/ Desiree Barber / [place] [facility]  Chief Complaint  Patient presents with   Consult    Consult for COPD with hypoxia and need for O2 form [facility]   Dyspnea:  12-15 min walks 1/2 mile some inclines s stopping on incruse x > year. 02 sats now ok p stops but not checking at peak ex  Cough: none  Sleep: well / on side bed is flat  SABA use: none   Rec Congratulations on not smoking -  it's the most important aspect of your care.  Ok to try off incruse to see if it affects your wind /breathing when walking and if worse consider restarting incruse or substituting anoro (the highest octane)  > did fine off incruse   71 y.o. female with medical history significant of anxiety, COPD, hypertension, presents to the ED with a chief complaint of shortness of breath.  Patient reports that she had shortness of breath for the last 10 days.  She has been checking her O2 sats with a pulse ox every day, and today it was down to 78% so she decided she needed to come into the ER.  She reports her shortness of breath has been constant and progressively worsening.  She has had a cough that is nonproductive.  She has heard herself wheezing.  Patient reports she does not have a rescue inhaler at home but she but has been using her twice daily inhaler.  Patient reports that she has not had a fever or felt lightheaded.  She has had palpitations with exertion.  Patient reports she was not exerting herself when she checked her pulse ox today.  She reports she feels much better now than when she came in.  She is confused as to why her COPD would flare when she quit smoking 7 months ago.   We discussed other triggers of COPD flare including season change and allergies.  Patient has no other complaints at this time.   Patient does not smoke she drinks 1-2 beers daily and has never had withdrawals.  She does not use drugs.  She is vaccinated for COVID and she has had this years flu shot.  She is full code.   Hospital Course by Problem    Assessment and Plan: * Acute respiratory failure with hypoxia (HCC) - Oxygen sats dropped down to 69% with ambulation - Secondary to COPD exacerbation see the respective plan - Continue supplemental O2 --> Pt qualifies for  Home oxygen  - Continue bronchodilators   Leukocytosis White blood cell count 12.5 - steroid induced    Anxiety disorder - Continue Xanax   COPD with acute exacerbation (HCC) - Patient desaturated to 69% with ambulation. - Normalized on 2 L nasal cannula - Continue Incruse and Dulera - Pt was treated with scheduled and as needed breathing treatments - treated with IV steroid   Pt requested to discharge home today. She has an Rx filled from her urgent care visit for azithromycin and prednisone taper.  I told her to finish that prescription.  We made arrangements for home oxygen for her.  She will DC home today.  Outpatient follow up with PCP recommended.    Discharge Diagnoses:  Principal Problem:   Acute respiratory failure with hypoxia ([place]) Active Problems:   COPD with acute exacerbation (HCC)   Anxiety disorder   Leukocytosis   01/05/2022  f/u ov/[place] office/Desiree Barber re: copd group E  maint on trelegy   Chief Complaint  Patient presents with   Hospitalization Follow-up    [facility] [place] admission from 9/30-10/1  Feels better since discharge but states her legs are giving out on her more that they used to since leaving the hospital    Dyspnea:  improved/ back shopping  Cough: min rattling  Sleeping: bed is flat/ on side / one pillow  SABA use: no 02: 2lpm hs and prn  Covid status: x 4  Lung cancer  screening: done    No obvious day to day or daytime variability or assoc excess/ purulent sputum or mucus plugs or hemoptysis or cp or chest tightness, subjective wheeze or overt sinus or hb symptoms.   Sleeping  without nocturnal  or early am exacerbation  of respiratory  c/o's or need for noct saba. Also denies any obvious fluctuation of symptoms with weather or environmental changes or other aggravating or alleviating factors except as outlined above   No unusual exposure hx or h/o childhood pna/ asthma or knowledge of premature birth.  Current Allergies, Complete Past Medical History, Past Surgical History, Family History, and Social History were reviewed in [employer] record.  ROS  The following are not active complaints unless bolded Hoarseness, sore throat, dysphagia, dental problems, itching, sneezing,  nasal congestion or discharge of excess mucus or purulent secretions, ear ache,   fever, chills, sweats, unintended wt loss or wt gain, classically pleuritic or exertional cp,  orthopnea pnd or arm/hand swelling  or leg swelling, presyncope, palpitations, abdominal pain, anorexia, nausea, vomiting, diarrhea  or change in bowel habits or change in bladder habits, change in stools or change in urine, dysuria, hematuria,  rash, arthralgias, visual complaints, headache, numbness, weakness or ataxia or problems with walking or coordination,  change in mood or  memory.          Current Meds  Medication Sig   ALPRAZolam (XANAX) 0.25 MG tablet Take 0.25 mg by mouth daily as needed for anxiety or sleep.   Ascorbic Acid (VITAMIN C) 100 MG tablet Take 100 mg by mouth daily.   TRELEGY ELLIPTA 100-62.5-25 MCG/ACT AEPB Inhale 1 puff into the lungs daily.   vitamin B-12 (CYANOCOBALAMIN) 100 MCG tablet Take 100 mcg by mouth daily.                    Past Medical History:  Diagnosis Date   Anxiety    Occasional   Arthritis    COPD (chronic obstructive pulmonary  disease) (HCC)    HTN (hypertension)        Objective:     Wt Readings from Last 3 Encounters:  01/05/22 119 lb 6.4 oz (54.2 kg)  01/05/22 119 lb 12.8 oz (54.3 kg)  12/24/21 115 lb 3.2 oz (52.3 kg)      Vital signs reviewed  01/05/2022  - Note at rest 02 sats  ***% on ***   General appearance:    pleasant amb wf    HEENT : Oropharynx  clear   Nasal turbinates nl    NECK :  without  apparent JVD/ palpable Nodes/TM    LUNGS: no acc  muscle use,  Min barrel  contour chest wall with bilateral  slightly decreased bs s audible wheeze and  without cough on insp or exp maneuvers and min  Hyperresonant  to  percussion bilaterally    CV:  RRR  no s3 or murmur or increase in P2, and no edema   ABD:  soft and nontender with pos end  insp Hoover's  in the supine position.  No bruits or organomegaly appreciated   MS:  Nl gait/ ext warm without deformities Or obvious joint restrictions  calf tenderness, cyanosis or clubbing     SKIN: warm and dry without lesions    NEURO:  alert, approp, nl sensorium with  no motor or cerebellar deficits apparent.                  Assessment

## 2022-01-06 ENCOUNTER — Encounter: Payer: Self-pay | Admitting: Internal Medicine

## 2022-01-06 DIAGNOSIS — J479 Bronchiectasis, uncomplicated: Secondary | ICD-10-CM | POA: Insufficient documentation

## 2022-01-06 DIAGNOSIS — J9611 Chronic respiratory failure with hypoxia: Secondary | ICD-10-CM | POA: Insufficient documentation

## 2022-01-06 NOTE — Assessment & Plan Note (Addendum)
Started on 02 12/23/21 2lpm hs and prn  - 01/05/2022 patient walked at a slow pace on room air x 3 laps each 150 ft. . Desat to 87% at end of third lap and reported legs being tired on lap 2. SOB on last lap  rec no need for 02 at rest or room to room at home but advised:   Make sure you check your oxygen saturation  AT  your highest level of activity (not after you stop)   to be sure it stays over 90% and adjust  02 flow upward to maintain this level if needed but remember to turn it back to previous settings when you stop (to conserve your supply).          Each maintenance medication was reviewed in detail including emphasizing most importantly the difference between maintenance and prns and under what circumstances the prns are to be triggered using an action plan format where appropriate.  Total time for H and P, chart review, counseling, reviewing dpi/02 flutter valve  device(s) and generating customized AVS unique to this office visit / same day charting  > 40 min post hosp f/u multiple separate new pulmonary issues

## 2022-01-06 NOTE — Assessment & Plan Note (Addendum)
See [facility] - referred to cards/RDS  01/05/2022 >>>   Explained this was an incidental finding that increases risk of IHD esp angina but no immediate need for concern in absence of ex cp (note leg weakness described though with hypoxemia may represent claudication)

## 2022-01-06 NOTE — Assessment & Plan Note (Signed)
L[facility]  12/28/21  Bilateral bronchiectasis and scattered areas of mucous plugging. Moderate centrilobular emphysema. New consolidation of the Lingula.  Reviewed pathophysiology and rx emphasizing "mucociliary escalator" malfunction and need to use Anoro daily and max mucolytic/ flutter valve rx then return for f/u cxr in 6 weeks

## 2022-01-06 NOTE — Assessment & Plan Note (Addendum)
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

## 2022-01-08 ENCOUNTER — Telehealth: Payer: Self-pay | Admitting: *Deleted

## 2022-01-08 NOTE — Telephone Encounter (Signed)
FYI: Lung Cancer Screening 12/28/21    IMPRESSION: 1. New consolidation of the lingula, likely due to worsened chronic atypical infection. Recommend follow-up chest CT in 3 months to ensure resolution. Lung-RADS 0, incomplete. Additional lung cancer screening CT images/or comparison to prior chest CT examinations is needed. 2. Previously seen nodule of the right lower is stable. Other nodules are resolved, decreased in size or stable. 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 3. Severe three-vessel coronary artery calcifications. 4. Aortic Atherosclerosis (ICD10-I70.0) and Emphysema (ICD10-J43.9).  Pt saw Dr Melvyn Novas in office on 01/05/22. Per Dr Melvyn Novas he will see pt back in office and will update [place] when to start pt back in lung cancer screening.

## 2022-01-10 NOTE — Telephone Encounter (Signed)
Pt has been added to reminder list.

## 2022-01-11 ENCOUNTER — Other Ambulatory Visit: Payer: Self-pay

## 2022-01-11 DIAGNOSIS — R911 Solitary pulmonary nodule: Secondary | ICD-10-CM

## 2022-01-11 DIAGNOSIS — Z87891 Personal history of nicotine dependence: Secondary | ICD-10-CM

## 2022-01-16 ENCOUNTER — Inpatient Hospital Stay: Payer: Medicare Other

## 2022-01-16 DIAGNOSIS — J441 Chronic obstructive pulmonary disease with (acute) exacerbation: Secondary | ICD-10-CM | POA: Diagnosis not present

## 2022-01-16 DIAGNOSIS — D75839 Thrombocytosis, unspecified: Secondary | ICD-10-CM | POA: Diagnosis not present

## 2022-01-16 DIAGNOSIS — D649 Anemia, unspecified: Secondary | ICD-10-CM

## 2022-01-16 DIAGNOSIS — K449 Diaphragmatic hernia without obstruction or gangrene: Secondary | ICD-10-CM | POA: Diagnosis not present

## 2022-01-16 DIAGNOSIS — D72829 Elevated white blood cell count, unspecified: Secondary | ICD-10-CM | POA: Diagnosis not present

## 2022-01-16 DIAGNOSIS — J432 Centrilobular emphysema: Secondary | ICD-10-CM | POA: Diagnosis not present

## 2022-01-16 DIAGNOSIS — J479 Bronchiectasis, uncomplicated: Secondary | ICD-10-CM | POA: Diagnosis not present

## 2022-01-16 LAB — CBC WITH DIFFERENTIAL/PLATELET
Abs Immature Granulocytes: 0.03 10*3/uL (ref 0.00–0.07)
Basophils Absolute: 0 10*3/uL (ref 0.0–0.1)
Basophils Relative: 1 %
Eosinophils Absolute: 0.2 10*3/uL (ref 0.0–0.5)
Eosinophils Relative: 3 %
HCT: 35.8 % — ABNORMAL LOW (ref 36.0–46.0)
Hemoglobin: 10 g/dL — ABNORMAL LOW (ref 12.0–15.0)
Immature Granulocytes: 0 %
Lymphocytes Relative: 17 %
Lymphs Abs: 1.2 10*3/uL (ref 0.7–4.0)
MCH: 24.3 pg — ABNORMAL LOW (ref 26.0–34.0)
MCHC: 27.9 g/dL — ABNORMAL LOW (ref 30.0–36.0)
MCV: 87.1 fL (ref 80.0–100.0)
Monocytes Absolute: 0.6 10*3/uL (ref 0.1–1.0)
Monocytes Relative: 9 %
Neutro Abs: 5.2 10*3/uL (ref 1.7–7.7)
Neutrophils Relative %: 70 %
Platelets: 456 10*3/uL — ABNORMAL HIGH (ref 150–400)
RBC: 4.11 MIL/uL (ref 3.87–5.11)
RDW: 16.8 % — ABNORMAL HIGH (ref 11.5–15.5)
WBC: 7.4 10*3/uL (ref 4.0–10.5)
nRBC: 0 % (ref 0.0–0.2)

## 2022-01-16 LAB — IRON AND TIBC
Iron: 16 ug/dL — ABNORMAL LOW (ref 28–170)
Saturation Ratios: 3 % — ABNORMAL LOW (ref 10.4–31.8)
TIBC: 487 ug/dL — ABNORMAL HIGH (ref 250–450)
UIBC: 471 ug/dL

## 2022-01-16 LAB — RETICULOCYTES
Immature Retic Fract: 30.5 % — ABNORMAL HIGH (ref 2.3–15.9)
RBC.: 4.06 MIL/uL (ref 3.87–5.11)
Retic Count, Absolute: 101.1 10*3/uL (ref 19.0–186.0)
Retic Ct Pct: 2.5 % (ref 0.4–3.1)

## 2022-01-16 LAB — LACTATE DEHYDROGENASE: LDH: 168 U/L (ref 98–192)

## 2022-01-16 LAB — FERRITIN: Ferritin: 4 ng/mL — ABNORMAL LOW (ref 11–307)

## 2022-01-16 LAB — FOLATE: Folate: 20.6 ng/mL (ref >5.9)

## 2022-01-16 LAB — VITAMIN B12: Vitamin B-12: 1111 pg/mL — ABNORMAL HIGH (ref 180–914)

## 2022-01-17 DIAGNOSIS — D8481 Immunodeficiency due to conditions classified elsewhere: Secondary | ICD-10-CM | POA: Diagnosis not present

## 2022-01-17 DIAGNOSIS — Z8261 Family history of arthritis: Secondary | ICD-10-CM | POA: Diagnosis not present

## 2022-01-17 DIAGNOSIS — D649 Anemia, unspecified: Secondary | ICD-10-CM | POA: Diagnosis not present

## 2022-01-23 NOTE — Progress Notes (Unsigned)
[address] [place], [place] [id number]   CLINIC:  Medical Oncology/Hematology  PCP:  Sharilyn Sites, [address] [place] [address] [phone]   REASON FOR VISIT:  Follow-up for leukocytosis and thrombocytosis  PRIOR THERAPY: None  CURRENT THERAPY: Under work-up  INTERVAL HISTORY:  Ms. Schepp 71 y.o. female returns for routine follow-up of her leukocytosis and thrombocytosis.  She was seen for initial consultation by Dr. Delton Coombes on 01/05/2022.  At today's visit, she reports feeling fair.  She denies any changes in her health since her initial visit with Dr. Delton Coombes on 2 weeks ago.  She continues to deny any fevers or night sweats.  She has not had any unintentional weight loss, but has gained 20+ pounds since she quit smoking.  No recurrent infections.  She denies any prior history of thrombosis.  She denies any aquagenic pruritus or vasomotor symptoms.  She reports that she quit smoking in March 2023.  She continues to use Trelegy (steroid containing inhaler) daily for her COPD, and has required systemic steroids on multiple occasions, most recently completed prednisone taper on 01/03/2022.    Regarding her recently noted iron deficiency anemia, she denies any melena, rectal bleeding, or other signs of abnormal blood loss.  She takes Goody powder twice daily for pain relief, no other blood thinners or NSAIDs.  She previously took iron supplement, stopped taking it a year ago due to constipation and stomach cramping.  History of blood transfusion in infancy, she does not donate blood.  She reports an iron rich diet.  Colonoscopy in 2017 showed tubular adenoma, she has never had an EGD.  She reports fatigue with energy about 50%.  Occasional chest pain that she attributes to acid reflux.  Baseline dyspnea on exertion from COPD.  No pica, lightheadedness, syncope, or headaches.  She has 50% energy and 100% appetite. She endorses that she  is maintaining a stable weight.   REVIEW OF SYSTEMS:  Review of Systems  Constitutional:  Positive for fatigue. Negative for appetite change, chills, diaphoresis, fever and unexpected weight change.  HENT:   Negative for lump/mass and nosebleeds.   Eyes:  Negative for eye problems.  Respiratory:  Positive for shortness of breath (With exertion, COPD). Negative for cough and hemoptysis.   Cardiovascular:  Positive for chest pain (Acid reflux per patient). Negative for leg swelling and palpitations.  Gastrointestinal:  Negative for abdominal pain, blood in stool, constipation, diarrhea, nausea and vomiting.  Genitourinary:  Negative for hematuria.   Musculoskeletal:  Positive for myalgias (Leg pain with exertion).  Skin: Negative.   Neurological:  Positive for numbness. Negative for dizziness, headaches and light-headedness.  Hematological:  Does not bruise/bleed easily.      PAST MEDICAL/SURGICAL HISTORY:  Past Medical History:  Diagnosis Date   Anxiety    Occasional   Arthritis    COPD (chronic obstructive pulmonary disease) (HCC)    HTN (hypertension)    Past Surgical History:  Procedure Laterality Date   COLONOSCOPY  02/23/04   RMR: normal rectum and colon.minimal internal hemorrhoids   COLONOSCOPY WITH PROPOFOL N/A 06/13/2015   Procedure: COLONOSCOPY WITH PROPOFOL;  Surgeon: Daneil Dolin, MD;  Location: [facility];  Service: Endoscopy;  Laterality: N/A;  1045   POLYPECTOMY  06/13/2015   Procedure: POLYPECTOMY;  Surgeon: Daneil Dolin, MD;  Location: [facility];  Service: Endoscopy;;  sigmoid colon polyp   TOOTH EXTRACTION     TRIGGER FINGER RELEASE Bilateral  TUBAL LIGATION       SOCIAL HISTORY:  Social History   Socioeconomic History   Marital status: Married    Spouse name: Not on file   Number of children: Not on file   Years of education: Not on file   Highest education level: Not on file  Occupational History   Not on file  Tobacco Use   Smoking  status: Former    Packs/day: 0.50    Years: 55.00    Total pack years: 27.50    Types: Cigarettes    Quit date: 05/29/2021    Years since quitting: 0.6   Smokeless tobacco: Former   Tobacco comments:    Quit date 05/29/2021  Vaping Use   Vaping Use: Never used  Substance and Sexual Activity   Alcohol use: Yes    Alcohol/week: 0.0 standard drinks of alcohol    Comment: 3 beers a day   Drug use: No   Sexual activity: Yes    Birth control/protection: Surgical  Other Topics Concern   Not on file  Social History Narrative   Not on file   Social Determinants of Health   Financial Resource Strain: Not on file  Food Insecurity: Unknown (12/24/2021)   Hunger Vital Sign    Worried About Running Out of Food in the Last Year: Never true    Ran Out of Food in the Last Year: Not on file  Transportation Needs: No Transportation Needs (12/24/2021)   PRAPARE - Hydrologist (Medical): No    Lack of Transportation (Non-Medical): No  Physical Activity: Not on file  Stress: Not on file  Social Connections: Not on file  Intimate Partner Violence: Not At Risk (12/24/2021)   Humiliation, Afraid, Rape, and Kick questionnaire    Fear of Current or Ex-Partner: No    Emotionally Abused: No    Physically Abused: No    Sexually Abused: No    FAMILY HISTORY:  Family History  Problem Relation Age of Onset   Colon cancer Neg Hx     CURRENT MEDICATIONS:  Outpatient Encounter Medications as of 01/24/2022  Medication Sig   ALPRAZolam (XANAX) 0.25 MG tablet Take 0.25 mg by mouth daily as needed for anxiety or sleep.   Ascorbic Acid (VITAMIN C) 100 MG tablet Take 100 mg by mouth daily.   umeclidinium-vilanterol (ANORO ELLIPTA) 62.5-25 MCG/ACT AEPB Inhale 1 puff into the lungs daily.   vitamin B-12 (CYANOCOBALAMIN) 100 MCG tablet Take 100 mcg by mouth daily.   No facility-administered encounter medications on file as of 01/24/2022.    ALLERGIES:  Allergies  Allergen  Reactions   Demerol [Meperidine] Other (See Comments)    Unknown   Oxycontin [Oxycodone Hcl] Other (See Comments)    Unknown   Fish Allergy Nausea Only    Only fish from [place]     PHYSICAL EXAM:  ECOG PERFORMANCE STATUS: 1 - Symptomatic but completely ambulatory  There were no vitals filed for this visit. There were no vitals filed for this visit. Physical Exam Constitutional:      Appearance: Normal appearance.  HENT:     Head: Normocephalic and atraumatic.     Mouth/Throat:     Mouth: Mucous membranes are moist.  Eyes:     Extraocular Movements: Extraocular movements intact.     Pupils: Pupils are equal, round, and reactive to light.  Cardiovascular:     Rate and Rhythm: Normal rate and regular rhythm.     Pulses:  Dorsalis pedis pulses are 1+ on the right side and 1+ on the left side.       Posterior tibial pulses are 1+ on the right side and 1+ on the left side.     Heart sounds: Normal heart sounds.  Pulmonary:     Effort: Pulmonary effort is normal.     Breath sounds: Normal breath sounds. Decreased air movement present.  Abdominal:     General: Bowel sounds are normal.     Palpations: Abdomen is soft.     Tenderness: There is no abdominal tenderness.  Musculoskeletal:        General: No swelling.     Right lower leg: No edema.     Left lower leg: No edema.  Lymphadenopathy:     Cervical: No cervical adenopathy.  Skin:    General: Skin is warm and dry.  Neurological:     General: No focal deficit present.     Mental Status: She is alert and oriented to person, place, and time.  Psychiatric:        Mood and Affect: Mood normal.        Behavior: Behavior normal.      LABORATORY DATA:  I have reviewed the labs as listed.  CBC    Component Value Date/Time   WBC 7.4 01/16/2022 0952   RBC 4.06 01/16/2022 0952   RBC 4.11 01/16/2022 0952   HGB 10.0 (L) 01/16/2022 0952   HCT 35.8 (L) 01/16/2022 0952   PLT 456 (H) 01/16/2022 0952   MCV  87.1 01/16/2022 0952   MCH 24.3 (L) 01/16/2022 0952   MCHC 27.9 (L) 01/16/2022 0952   RDW 16.8 (H) 01/16/2022 0952   LYMPHSABS 1.2 01/16/2022 0952   MONOABS 0.6 01/16/2022 0952   EOSABS 0.2 01/16/2022 0952   BASOSABS 0.0 01/16/2022 0952      Latest Ref Rng & Units 12/24/2021    4:54 AM 12/23/2021    8:40 PM 08/09/2020   12:59 PM  CMP  Glucose 70 - 99 mg/dL 140  109  91   BUN 8 - 23 mg/dL '7  10  9   '$ Creatinine 0.44 - 1.00 mg/dL 0.66  0.57  0.54   Sodium 135 - 145 mmol/L 137  138  136   Potassium 3.5 - 5.1 mmol/L 4.1  3.5  4.3   Chloride 98 - 111 mmol/L 104  100  97   CO2 22 - 32 mmol/L 28  29  32   Calcium 8.9 - 10.3 mg/dL 8.3  9.1  9.0   Total Protein 6.5 - 8.1 g/dL 6.8     Total Bilirubin 0.3 - 1.2 mg/dL 0.6     Alkaline Phos 38 - 126 U/L 107     AST 15 - 41 U/L 22     ALT 0 - 44 U/L 20       DIAGNOSTIC IMAGING:  I have independently reviewed the relevant imaging and discussed with the patient.  ASSESSMENT & PLAN: 1.   Leukocytosis and thrombocytosis: - Patient seen at the request of Dr. Hilma Favors for elevated white count and platelet count. - Labs from PCP (12/21/2021): WBC 13.3, Hgb 10.7/MCV 84, platelets 511 - Review of lab trends shows intermittently elevated white blood cells over the past 5+ years - She was recently admitted to the hospital from 12/23/2021 through 12/24/2021, treated with steroids and antibiotics for possible COPD exacerbation/infection. - She completed prednisone taper on 01/03/2022.  She uses Trelegy inhaler daily. - Denies  any fevers or night sweats.  Gained 22 pounds since she quit smoking.  No recurrent infections.  No vasomotor symptoms/aquagenic pruritus/prior thrombosis. - Most recent CBC (01/16/2022), checked 2 weeks after completion of steroid taper, showed normalization of WBC at 7.4 with normal differential. - Additional hematology work-up (01/16/2022): Normal LDH.  Reticulocytes 2.5%. Significant iron deficiency with ferritin 4, iron saturation  3%, and TIBC 487 Normal B12 and folate. - Clinical picture favors reactive thrombocytosis from iron deficiency.  Leukocytosis likely reactive in the setting of COPD and frequent steroids, including chronic steroid inhaler (Trelegy) - PLAN: No indication for MPN work-up at this time, but would consider additional testing if patient had persistent unexplained leukocytosis or thrombocytosis.  2.  Iron deficiency anemia - Labs from 01/16/2022 show Hgb 10.0/MCV 87.1, ferritin 4, iron saturation 3%, TIBC 487.  B12, folate, LDH, and reticulocytes were normal.  Recent CMP (123) showed normal kidney function. - Colonoscopy (06/13/2015): Diverticulosis, polyp x1 (tubular adenoma) - 5 year follow-up recommended by Dr. Gala Romney - She has never had EGD. - She takes Goody powder twice daily. - Took iron supplement previously, quit 1 year ago due to constipation and upset stomach - No bright red blood per rectum or melena. - Reports fatigue with energy about 50% - PLAN: We will check stool cards x3 and refer to GI for work-up of possible occult GI blood loss. - Trial of oral iron supplementation (ferrous sulfate daily) - Repeat labs and RTC in 8 weeks   3.  Smoking history - She smoked 1 PPD x55 years, quit in March 2023 - This patient meets criteria for low-dose CT lung cancer screening until age 35 - She receives annual lung cancer screening via NP Eric [facility] at [facility] pulmonology  4.  Other concerns - Patient described symptoms consistent with leg claudication - This was discussed with her, and recommendation made for her to speak further with her PCP as she will likely need referral to vascular surgery for additional work-up and treatment - Also discussed with patient that her CT scan showed coronary artery disease, which places her at increased risk of heart attack.  We reviewed alarm symptoms that would prompt immediate medical attention.  She follows with cardiology.  5.  Other history - PMH: COPD,  hypertension, anxiety, arthritis - She lives at home with her husband.  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  Quit smoking on 05/29/2021.  Smoked 1 pack/day for 55 years. - No family history of leukemia.  Maternal uncle had lung cancer and was smoker.   PLAN SUMMARY & DISPOSITION: Stool cards x3 Referral to Dr. Gala Romney for EGD/colonoscopy Labs in 8 weeks Office visit 1 week after labs  All questions were answered. The patient knows to call the clinic with any problems, questions or concerns.  Medical decision making: Moderate  Time spent on visit: I spent 25 minutes counseling the patient face to face. The total time spent in the appointment was 40 minutes and more than 50% was on counseling.   Harriett Rush, PA-C  01/24/2022 10:25 AM

## 2022-01-24 ENCOUNTER — Encounter: Payer: Self-pay | Admitting: Internal Medicine

## 2022-01-24 ENCOUNTER — Inpatient Hospital Stay: Payer: Medicare Other | Attending: Hematology | Admitting: Physician Assistant

## 2022-01-24 VITALS — BP 154/77 | HR 82 | Temp 98.0°F | Resp 16 | Wt 116.8 lb

## 2022-01-24 DIAGNOSIS — D72829 Elevated white blood cell count, unspecified: Secondary | ICD-10-CM | POA: Insufficient documentation

## 2022-01-24 DIAGNOSIS — R200 Anesthesia of skin: Secondary | ICD-10-CM | POA: Diagnosis not present

## 2022-01-24 DIAGNOSIS — D75839 Thrombocytosis, unspecified: Secondary | ICD-10-CM | POA: Diagnosis not present

## 2022-01-24 DIAGNOSIS — R0602 Shortness of breath: Secondary | ICD-10-CM | POA: Diagnosis not present

## 2022-01-24 DIAGNOSIS — Z79899 Other long term (current) drug therapy: Secondary | ICD-10-CM | POA: Insufficient documentation

## 2022-01-24 DIAGNOSIS — Z87891 Personal history of nicotine dependence: Secondary | ICD-10-CM | POA: Insufficient documentation

## 2022-01-24 DIAGNOSIS — M791 Myalgia, unspecified site: Secondary | ICD-10-CM | POA: Insufficient documentation

## 2022-01-24 DIAGNOSIS — D509 Iron deficiency anemia, unspecified: Secondary | ICD-10-CM | POA: Diagnosis not present

## 2022-01-24 DIAGNOSIS — K5900 Constipation, unspecified: Secondary | ICD-10-CM | POA: Diagnosis not present

## 2022-01-24 DIAGNOSIS — Z8719 Personal history of other diseases of the digestive system: Secondary | ICD-10-CM | POA: Diagnosis not present

## 2022-01-24 DIAGNOSIS — R079 Chest pain, unspecified: Secondary | ICD-10-CM | POA: Diagnosis not present

## 2022-01-24 DIAGNOSIS — Z885 Allergy status to narcotic agent status: Secondary | ICD-10-CM | POA: Insufficient documentation

## 2022-01-24 DIAGNOSIS — J449 Chronic obstructive pulmonary disease, unspecified: Secondary | ICD-10-CM | POA: Diagnosis not present

## 2022-01-24 NOTE — Patient Instructions (Addendum)
[facility] **   You were seen today by Tarri Abernethy PA-C for your follow-up visit.    ELEVATED WHITE BLOOD CELLS Your white blood cells have returned to normal.  They are elevated intermittently due to the steroids you take for your COPD.  ELEVATED PLATELETS Your platelets are  elevated due to your iron deficiency (see below).  IRON DEFICIENCY ANEMIA Your iron levels and red blood cells are lower than normal. This may be due to to possible bleeding from your stomach or intestines. We will check stool cards x3 to see if you have blood in your bowel movements.  Check samples on 3 different days of the week and bring the completed cards back to the fourth floor within the next 1 to 2 weeks. We will refer you to Dr. Gala Romney (gastroenterology) for colonoscopy and endoscopy to look at your stomach and intestines for possible sources of bleeding. You may have some ulcers or irritation in your stomach from taking Goody powder.  I strongly recommend that you stop taking Goody powder, ibuprofen, or any other NSAID medications. Start taking iron tablet daily. Take over-the-counter ferrous sulfate 325 mg (contains 65 mg elemental iron). Take this medication in the morning along with your vitamin C and a small glass of orange juice. You may need to take over-the-counter stool softener due to constipation from iron tablet. If you have any severe side effects or your blood levels do not improve, we will consider IV iron at follow-up visits.  LEG PAIN This is unlikely to be related to the abnormal blood labs that we are following you for, but your symptoms sound like they may be due to something called "claudication." This means that you may have some narrowed or blocked arteries in your legs which is causing decreased blood flow to your legs.  This causes symptoms of pain when you use the muscles in your legs. It is  important that you discuss this with your primary care doctor as soon as possible.  They will need to refer you to a vascular specialist for further testing and possible treatment.  CORONARY ARTERY DISEASE The most recent CT scan of your chest showed plaque in the arteries around your heart. This places you at increased risk of heart attack. Please continue to follow-up with your heart doctor. Please read the attached handout for important information about "red flag" warning signs that would need immediate medical attention.  LABS: Return in 8 weeks for repeat labs  FOLLOW-UP APPOINTMENT: Office visit in 8 weeks, 1 week after labs  ** Thank you for trusting me with your healthcare!  I strive to provide all of my patients with quality care at each visit.  If you receive a survey for this visit, I would be so grateful to you for taking the time to provide feedback.  Thank you in advance!  ~ Stefhanie Kachmar                   Dr. Derek Jack   &   Tarri Abernethy, PA-C   - - - - - - - - - - - - - - - - - -    Thank you for choosing [facility] to provide your oncology and hematology care.  To afford each patient quality time with our provider, please arrive at least 15 minutes before your scheduled appointment time.   If you  have a lab appointment with the [facility] please come in thru the Main Entrance and check in at the main information desk.  You need to re-schedule your appointment should you arrive 10 or more minutes late.  We strive to give you quality time with our providers, and arriving late affects you and other patients whose appointments are after yours.  Also, if you no show three or more times for appointments you may be dismissed from the clinic at the providers discretion.     Again, thank you for choosing [facility].  Our hope is that these requests will decrease the amount of time that you wait before being seen by our  physicians.       _____________________________________________________________  Should you have questions after your visit to [facility], please contact our office at [phone] and follow the prompts.  Our office hours are 8:00 a.m. and 4:30 p.m. Monday - Friday.  Please note that voicemails left after 4:00 p.m. may not be returned until the following business day.  We are closed weekends and major holidays.  You do have access to a nurse 24-7, just call the main number to the clinic [phone] and do not press any options, hold on the line and a nurse will answer the phone.    For prescription refill requests, have your pharmacy contact our office and allow 72 hours.

## 2022-01-29 DIAGNOSIS — M542 Cervicalgia: Secondary | ICD-10-CM | POA: Diagnosis not present

## 2022-01-29 DIAGNOSIS — Z6820 Body mass index (BMI) 20.0-20.9, adult: Secondary | ICD-10-CM | POA: Diagnosis not present

## 2022-01-29 DIAGNOSIS — M5030 Other cervical disc degeneration, unspecified cervical region: Secondary | ICD-10-CM | POA: Diagnosis not present

## 2022-02-02 ENCOUNTER — Other Ambulatory Visit: Payer: Self-pay

## 2022-02-02 DIAGNOSIS — R079 Chest pain, unspecified: Secondary | ICD-10-CM | POA: Diagnosis not present

## 2022-02-02 DIAGNOSIS — J449 Chronic obstructive pulmonary disease, unspecified: Secondary | ICD-10-CM | POA: Diagnosis not present

## 2022-02-02 DIAGNOSIS — D509 Iron deficiency anemia, unspecified: Secondary | ICD-10-CM

## 2022-02-02 DIAGNOSIS — K5900 Constipation, unspecified: Secondary | ICD-10-CM | POA: Diagnosis not present

## 2022-02-02 DIAGNOSIS — D75839 Thrombocytosis, unspecified: Secondary | ICD-10-CM | POA: Diagnosis not present

## 2022-02-02 DIAGNOSIS — D72829 Elevated white blood cell count, unspecified: Secondary | ICD-10-CM | POA: Diagnosis not present

## 2022-02-02 LAB — OCCULT BLOOD X 1 CARD TO LAB, STOOL
Fecal Occult Bld: POSITIVE — AB
Fecal Occult Bld: POSITIVE — AB
Fecal Occult Bld: POSITIVE — AB

## 2022-02-06 DIAGNOSIS — F419 Anxiety disorder, unspecified: Secondary | ICD-10-CM | POA: Diagnosis not present

## 2022-02-06 DIAGNOSIS — Z6821 Body mass index (BMI) 21.0-21.9, adult: Secondary | ICD-10-CM | POA: Diagnosis not present

## 2022-02-06 DIAGNOSIS — M542 Cervicalgia: Secondary | ICD-10-CM | POA: Diagnosis not present

## 2022-02-08 DIAGNOSIS — M4722 Other spondylosis with radiculopathy, cervical region: Secondary | ICD-10-CM | POA: Diagnosis not present

## 2022-02-09 ENCOUNTER — Ambulatory Visit
Admission: RE | Admit: 2022-02-09 | Discharge: 2022-02-09 | Disposition: A | Discharge status: Home / Self Care | Payer: Medicare Other | Source: Ambulatory Visit | Attending: Family Medicine | Admitting: Family Medicine

## 2022-02-09 DIAGNOSIS — Z1231 Encounter for screening mammogram for malignant neoplasm of breast: Secondary | ICD-10-CM

## 2022-02-14 ENCOUNTER — Ambulatory Visit (INDEPENDENT_AMBULATORY_CARE_PROVIDER_SITE_OTHER): Payer: Medicare Other | Admitting: Internal Medicine

## 2022-02-14 ENCOUNTER — Encounter: Payer: Self-pay | Admitting: Internal Medicine

## 2022-02-14 VITALS — BP 136/84 | HR 100 | Temp 97.6°F | Ht 65.0 in | Wt 117.8 lb

## 2022-02-14 DIAGNOSIS — I2584 Coronary atherosclerosis due to calcified coronary lesion: Secondary | ICD-10-CM

## 2022-02-14 DIAGNOSIS — J9611 Chronic respiratory failure with hypoxia: Secondary | ICD-10-CM | POA: Diagnosis not present

## 2022-02-14 DIAGNOSIS — J479 Bronchiectasis, uncomplicated: Secondary | ICD-10-CM | POA: Diagnosis not present

## 2022-02-14 DIAGNOSIS — I2510 Atherosclerotic heart disease of native coronary artery without angina pectoris: Secondary | ICD-10-CM | POA: Diagnosis not present

## 2022-02-14 NOTE — Patient Instructions (Signed)
Make sure you check your oxygen saturation  AT  your highest level of activity (not after you stop)   to be sure it stays over 90% and adjust  02 flow upward or slow down to maintain this level if needed but remember to turn it back to previous settings when you stop (to conserve your supply).   No change in medications   Please schedule a follow up visit in 12 months but call sooner if needed

## 2022-02-14 NOTE — Progress Notes (Unsigned)
Desiree Barber, female    DOB: November 13, 1950,    MRN: [id number]   Brief patient profile:  71  yowf  quit smoking 05/29/21/MM with doe/cough which improved  referred to pulmonary clinic in [place]  06/13/2021 by Desiree Mares PA for ? 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    History of Present Illness  06/13/2021  Pulmonary/ 1st office eval/ Desiree Barber / [place] [facility]  Chief Complaint  Patient presents with   Consult    Consult for COPD with hypoxia and need for O2 form [facility]   Dyspnea:  12-15 min walks 1/2 mile some inclines s stopping on incruse x > year. 02 sats now ok p stops but not checking at peak ex  Cough: none  Sleep: well / on side bed is flat  SABA use: none   Rec Congratulations on not smoking -  it's the most important aspect of your care.  Ok to try off incruse to see if it affects your wind /breathing when walking and if worse consider restarting incruse or substituting anoro (the highest octane)  > did fine off incruse   Admit date: 12/23/2021 Discharge date: 12/24/2021 71 y.o. female with medical history significant of anxiety, COPD, hypertension, presents to the ED with a chief complaint of shortness of breath.  Patient reports that she had shortness of breath for the last 10 days.  She has been checking her O2 sats with a pulse ox every day, and today it was down to 78% so she decided she needed to come into the ER.  She reports her shortness of breath has been constant and progressively worsening.  She has had a cough that is nonproductive.  She has heard herself wheezing.  Patient reports she does not have a rescue inhaler at home but she but has been using her twice daily inhaler.  Patient reports that she has not had a fever or felt lightheaded.  She has had palpitations with exertion.  Patient reports she was not exerting herself when she checked her pulse ox today.  She reports she feels much better now than when she came in.  She is confused as to why her COPD  would flare when she quit smoking 7 months ago.  We discussed other triggers of COPD flare including season change and allergies.  Patient has no other complaints at this time.   Patient does not smoke she drinks 1-2 beers daily and has never had withdrawals.  She does not use drugs.  She is vaccinated for COVID and she has had this years flu shot.  She is full code.   Hospital Course by Problem    Assessment and Plan: * Acute respiratory failure with hypoxia (HCC) - Oxygen sats dropped down to 69% with ambulation - Secondary to COPD exacerbation see the respective plan - Continue supplemental O2 --> Pt qualifies for  Home oxygen  - Continue bronchodilators   Leukocytosis White blood cell count 12.5 - steroid induced    Anxiety disorder - Continue Xanax   COPD with acute exacerbation (HCC) - Patient desaturated to 69% with ambulation. - Normalized on 2 L nasal cannula - Continue Incruse and Dulera - Pt was treated with scheduled and as needed breathing treatments - treated with IV steroid   Pt requested to discharge home today. She has an Rx filled from her urgent care visit for azithromycin and prednisone taper.  I told her to finish that prescription.  We made arrangements for home  oxygen for her.  She will DC home today.  Outpatient follow up with PCP recommended.    Discharge Diagnoses:  Principal Problem:   Acute respiratory failure with hypoxia (HCC)   COPD with acute exacerbation (HCC)   Anxiety disorder   Leukocytosis     01/05/2022  f/u ov/[place] office/Desiree Barber re: copd group E  maint on trelegy   Chief Complaint  Patient presents with   Hospitalization Follow-up    [facility] admission from 9/30-10/1  Feels better since discharge but states her legs are giving out on her more that they used to since leaving the hospital    Dyspnea:  improved/ back shopping  Cough: min rattling  Sleeping: bed is flat/ on side / one pillow  SABA use: no 02: 2lpm hs and prn   Covid status: x 4  Lung cancer screening: done  Rec Bronchiectasis =   you have scarring of your bronchial tubes which means that they don't function perfectly normally   For cough /congestion > mucinex  1200 mg every 12 hours and use the flutter valve as much as possible to help bring mucus up prior to your next ct to clear out the lingula  Make sure you check your oxygen saturation  AT  your highest level of activity (not after you stop)   My office will be contacting you by phone for referral to [facility] cardiology ( [facility] of [facility])      02/14/2022  f/u ov/[place] office/Desiree Barber re: bronchiectasis maint on anoro   Chief Complaint  Patient presents with   Follow-up    Breathing doing better but now has other health concerns to discuss   Dyspnea:  shopping ok / decorating  Cough: none  Sleeping: flat bed/ one pillow SABA use: none  02: not using  Lung cancer screening: in program    No obvious day to day or daytime variability or assoc excess/ purulent sputum or mucus plugs or hemoptysis or cp or chest tightness, subjective wheeze or overt sinus or hb symptoms.   Sleeping  without nocturnal  or early am exacerbation  of respiratory  c/o's or need for noct saba. Also denies any obvious fluctuation of symptoms with weather or environmental changes or other aggravating or alleviating factors except as outlined above   No unusual exposure hx or h/o childhood pna/ asthma or knowledge of premature birth.  Current Allergies, Complete Past Medical History, Past Surgical History, Family History, and Social History were reviewed in [employer] record.  ROS  The following are not active complaints unless bolded Hoarseness, sore throat, dysphagia, dental problems, itching, sneezing,  nasal congestion or discharge of excess mucus or purulent secretions, ear ache,   fever, chills, sweats, unintended wt loss or wt gain, classically pleuritic or exertional cp,  orthopnea pnd  or arm/hand swelling  or leg swelling, presyncope, palpitations, abdominal pain, anorexia, nausea, vomiting, diarrhea  or change in bowel habits or change in bladder habits, change in stools or change in urine, dysuria, hematuria,  rash, arthralgias, visual complaints, headache, numbness, weakness or ataxia or problems with walking or coordination,  change in mood or  memory.        Current Meds  Medication Sig   ALPRAZolam (XANAX) 0.25 MG tablet Take 0.25 mg by mouth daily as needed for anxiety or sleep.   Ascorbic Acid (VITAMIN C) 100 MG tablet Take 100 mg by mouth daily.   Iron Combinations (CHROMAGEN) capsule Take 1 capsule by mouth daily.  predniSONE (DELTASONE) 10 MG tablet Take by mouth.   umeclidinium-vilanterol (ANORO ELLIPTA) 62.5-25 MCG/ACT AEPB Inhale 1 puff into the lungs daily.   vitamin B-12 (CYANOCOBALAMIN) 100 MCG tablet Take 100 mcg by mouth daily.                Past Medical History:  Diagnosis Date   Anxiety    Occasional   Arthritis    COPD (chronic obstructive pulmonary disease) (HCC)    HTN (hypertension)        Objective:     02/14/2022     117   01/05/22 119 lb 6.4 oz (54.2 kg)  01/05/22 119 lb 12.8 oz (54.3 kg)  12/24/21 115 lb 3.2 oz (52.3 kg)     Vital signs reviewed  02/14/2022  - Note at rest 02 sats  95% on RA   General appearance:    amb wf nad    HEENT : Oropharynx  clear       NECK :  without  apparent JVD/ palpable Nodes/TM    LUNGS: no acc muscle use,  Min barrel  contour chest wall with bilateral  slightly decreased bs s audible wheeze and  without cough on insp or exp maneuvers and min  Hyperresonant  to  percussion bilaterally    CV:  RRR  no s3 or murmur or increase in P2, and no edema   ABD:  soft and nontender with pos end  insp Hoover's  in the supine position.  No bruits or organomegaly appreciated   MS:  Nl gait/ ext warm without deformities Or obvious joint restrictions  calf tenderness, cyanosis or clubbing      SKIN: warm and dry without lesions    NEURO:  alert, approp, nl sensorium with  no motor or cerebellar deficits apparent.              Assessment

## 2022-02-15 ENCOUNTER — Encounter: Payer: Self-pay | Admitting: Internal Medicine

## 2022-02-15 NOTE — Assessment & Plan Note (Signed)
LDSCT  12/28/21  Bilateral bronchiectasis and scattered areas of mucous plugging. Moderate centrilobular emphysema. New consolidation of the lingula.  Adequate control on present rx, reviewed in detail with pt > no change in rx needed    Continue anoro one click each am and f/u q 12 m, sooner prn flare

## 2022-02-15 NOTE — Progress Notes (Signed)
Cardiology Office Note:    Date:  02/19/2022   ID:  Desiree Barber, DOB 10-Dec-1950, MRN [id number]  PCP:  Desiree Sites, MD   [facility] Providers Cardiologist:  None    Referring MD: Desiree Rockers, MD    History of Present Illness:    Desiree Barber is a 71 y.o. female with a hx of HTN, coronary artery calcification, and COPD who was referred by Dr. Melvyn Novas for further evaluation of coronary artery Ca.  Patient followed by Dr. Melvyn Novas on 02/14/22 for COPD. She had a lung cancer screening CT chest which showed severe coronary artery calcification and severe atherosclerosis in the aorta prompting referral to Cardiology for further evaluation.  Today, the patient states that she has been having dyspnea on exertion but this has improved since she quit smoking 9 months ago. No chest pain, orthopnea, PND. States she is overall active without significant symptoms. She is occasionally on oxygen but she is needing it less. Has been released by Dr. Melvyn Novas.  She does report some fatigue in her legs with exertion. No significant pain. Symptoms have improved some with prednisone. No symptoms at rest.   Also having neck pain. Was started on prednisone with improvement. Plans to start PT.  Has recent history of GIB due to NSAID use. She is planned for EGD/colo.  Prior heavy smoking history 2ppd x55 years.   Past Medical History:  Diagnosis Date   Anxiety    Occasional   Arthritis    COPD (chronic obstructive pulmonary disease) (HCC)    HTN (hypertension)     Past Surgical History:  Procedure Laterality Date   COLONOSCOPY  02/23/04   RMR: normal rectum and colon.minimal internal hemorrhoids   COLONOSCOPY WITH PROPOFOL N/A 06/13/2015   Procedure: COLONOSCOPY WITH PROPOFOL;  Surgeon: Desiree Dolin, MD;  [facility]: [facility];  Service: Endoscopy;  Laterality: N/A;  1045   POLYPECTOMY  06/13/2015   Procedure: POLYPECTOMY;  Surgeon: Desiree Dolin, MD;  Location: [facility];   Service: Endoscopy;;  sigmoid colon polyp   TOOTH EXTRACTION     TRIGGER FINGER RELEASE Bilateral    TUBAL LIGATION      Current Medications: Current Meds  Medication Sig   ALPRAZolam (XANAX) 0.25 MG tablet Take 0.25 mg by mouth daily as needed for anxiety or sleep.   Ascorbic Acid (VITAMIN C) 100 MG tablet Take 100 mg by mouth daily.   Iron Combinations (CHROMAGEN) capsule Take 1 capsule by mouth daily.   losartan (COZAAR) 25 MG tablet Take 1 tablet (25 mg total) by mouth daily.   predniSONE (DELTASONE) 10 MG tablet Take by mouth.   rosuvastatin (CRESTOR) 20 MG tablet Take 1 tablet (20 mg total) by mouth daily.   umeclidinium-vilanterol (ANORO ELLIPTA) 62.5-25 MCG/ACT AEPB Inhale 1 puff into the lungs daily.   vitamin B-12 (CYANOCOBALAMIN) 100 MCG tablet Take 100 mcg by mouth daily.     Allergies:   Demerol [meperidine], Oxycontin [oxycodone hcl], and Fish allergy   Social History   Socioeconomic History   Marital status: Married    Spouse name: Not on file   Number of children: Not on file   Years of education: Not on file   Highest education level: Not on file  Occupational History   Not on file  Tobacco Use   Smoking status: Former    Packs/day: 0.50    Years: 55.00    Total pack years: 27.50    Types: Cigarettes  Quit date: 05/29/2021    Years since quitting: 0.7   Smokeless tobacco: Former   Tobacco comments:    Quit date 05/29/2021  Vaping Use   Vaping Use: Never used  Substance and Sexual Activity   Alcohol use: Yes    Alcohol/week: 0.0 standard drinks of alcohol    Comment: 3 beers a day   Drug use: No   Sexual activity: Yes    Birth control/protection: Surgical  Other Topics Concern   Not on file  Social History Narrative   Not on file   Social Determinants of Health   Financial Resource Strain: Not on file  Food Insecurity: Unknown (12/24/2021)   Hunger Vital Sign    Worried About Running Out of Food in the Last Year: Never true    Ran Out of  Food in the Last Year: Not on file  Transportation Needs: No Transportation Needs (12/24/2021)   PRAPARE - Hydrologist (Medical): No    Lack of Transportation (Non-Medical): No  Physical Activity: Not on file  Stress: Not on file  Social Connections: Not on file     Family History: The patient's family history is negative for Colon cancer and Breast cancer.  ROS:   Please see the history of present illness.     All other systems reviewed and are negative.  EKGs/Labs/Other Studies Reviewed:    The following studies were reviewed today: CT chest 12/2021: FINDINGS: Cardiovascular: Normal heart size. No pericardial effusion. Severe three-vessel coronary artery calcifications. Normal caliber thoracic aorta with severe calcified plaque.   Mediastinum/Nodes: Small hiatal hernia. Thyroid is unremarkable. No pathologically enlarged nodes seen in the chest.   Lungs/Pleura: Bilateral bronchiectasis and scattered areas of mucous plugging. Moderate centrilobular emphysema. New consolidation of the lingula. Previously seen large nodule of the right lower measuring 14.9 mm on image 280 is stable. Other nodules are resolved, decreased in size or stable.   Upper Abdomen: No acute abnormality.   Musculoskeletal: No chest wall mass or suspicious bone lesions identified.   IMPRESSION: 1. New consolidation of the lingula, likely due to worsened chronic atypical infection. Recommend follow-up chest CT in 3 months to ensure resolution. Lung-RADS 0, incomplete. Additional lung cancer screening CT images/or comparison to prior chest CT examinations is needed. 2. Previously seen nodule of the right lower is stable. Other nodules are resolved, decreased in size or stable. 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 3. Severe three-vessel coronary artery calcifications. 4. Aortic  Atherosclerosis (ICD10-I70.0) and Emphysema (ICD10-J43.9).    EKG:  EKG is  ordered today.  The ekg ordered today demonstrates NSR, iRBBB, HR 82  Recent Labs: 12/23/2021: B Natriuretic Peptide 57.0 12/24/2021: ALT 20; BUN 7; Creatinine, Ser 0.66; Magnesium 2.2; Potassium 4.1; Sodium 137 01/16/2022: Hemoglobin 10.0; Platelets 456  Recent Lipid Panel No results found for: "CHOL", "TRIG", "HDL", "CHOLHDL", "VLDL", "LDLCALC", "LDLDIRECT"   Risk Assessment/Calculations:      HYPERTENSION CONTROL Vitals:   02/19/22 1026 02/19/22 1228  BP: (!) 152/98 (!) 143/92    The patient's blood pressure is elevated above target today.  In order to address the patient's elevated BP: A new medication was prescribed today.            Physical Exam:    VS:  BP (!) 143/92   Pulse 82   Ht '5\' 5"'$  (1.651 m)   Wt 120 lb 9.6 oz (54.7 kg)   SpO2 97%  BMI 20.07 kg/m     Wt Readings from Last 3 Encounters:  02/19/22 120 lb 9.6 oz (54.7 kg)  02/14/22 117 lb 12.8 oz (53.4 kg)  01/24/22 116 lb 13.5 oz (53 kg)     GEN:  Well nourished, well developed in no acute distress HEENT: Normal NECK: No JVD; No carotid bruits CARDIAC: RRR, 1/6 systolic murmur RESPIRATORY:  Clear to auscultation without rales, wheezing or rhonchi  ABDOMEN: Soft, non-tender, non-distended MUSCULOSKELETAL:  No edema; No deformity  SKIN: Warm and dry NEUROLOGIC:  Alert and oriented x 3 PSYCHIATRIC:  Normal affect   ASSESSMENT:    1. Coronary artery disease involving native heart without angina pectoris, unspecified vessel or lesion type   2. Primary hypertension   3. Claudication ([place])   4. Medication management   5. Hyperlipidemia, unspecified hyperlipidemia type   6. Aortic atherosclerosis ([place])   7. Chronic obstructive pulmonary disease, unspecified COPD type ([place])   8. History of tobacco use    PLAN:    In order of problems listed above:  #Coronary Artery Ca: #Multivessel CAD: Noted on CT chest which  showed severe triple vessel CAD. Currently, with mild fatigue and DOE but this is overall stable and improving slightly since quitting smoking. Discussed option of TTE/myoview but she would like to defer for now. Cannot tolerate ASA due to concern for GIB in the setting of NSAID use. May consider in the future after GI work-up. -Start crestor '20mg'$  daily -Cannot tolerate ASA due to recent GIB; is planned for EGD/colo -Check lipid panel now and 8 weeks after starting statin -May consider TTE/myoview in New Year  #Concern for LE Claudication: Patient presents LE fatigue with walking. Symptoms resolve with rest. Difficult to palpate PT/DP pulse with concern for possible claudication. Will check ABI/dopplers. -Check ABI and dopplers -Start crestor '20mg'$  daily as above  #HTN: Running mainly 130-140s at home. States she dropped low with lisinopril '5mg'$  in the past. Will trial low dose losartan and monitor response. -Start losartan '25mg'$  daily -BMET next week -Goal BP <130/90  #Concern for GIB: Patient with reported blood in her stool in the setting of NSAID use. Has been started on OTC PPI and is currently holding NSAIDs. Planned for GI work-up with EGD/colo. -Continue PPI -Hold ASA for now while awaiting GI work-up  #Aortic Athertosclerosis: -Check lipid panel -Start crestor '20mg'$  daily given severe Ca as detailed above -Goal LDL<70; can adjust crestor pending lipids  #COPD: Followed with Dr. Melvyn Novas. Overall improved symptoms since stopping smoking.  #History of Tobacco Abuse: Quit 9 months ago. Prior heavy smoker (2ppd x55years). -Enrolled in CT lung cancer screening           Medication Adjustments/Labs and Tests Ordered: Current medicines are reviewed at length with the patient today.  Concerns regarding medicines are outlined above.  Orders Placed This Encounter  Procedures   US ARTERIAL LOWER EXTREMITY DUPLEX BILATERAL   US ARTERIAL ABI (SCREENING LOWER EXTREMITY)   Basic  metabolic panel   Lipid Profile   Lipid Profile   EKG 12-Lead   Meds ordered this encounter  Medications   rosuvastatin (CRESTOR) 20 MG tablet    Sig: Take 1 tablet (20 mg total) by mouth daily.    Dispense:  90 tablet    Refill:  2   losartan (COZAAR) 25 MG tablet    Sig: Take 1 tablet (25 mg total) by mouth daily.    Dispense:  90 tablet    Refill:  2  Patient Instructions  Medication Instructions:   START TAKING ROSUVASTATIN (CRESTOR) 20 MG BY MOUTH DAILY  START TAKING LOSARTAN 25 MG BY MOUTH DAILY  *If you need a refill on your cardiac medications before your next appointment, please call your pharmacy*   Lab Work:  1.)  IN ONE WEEK HERE IN THE OFFICE--CHECK BMET AND LIPIDS--PLEASE COME FASTING  2.)  IN 8 WEEKS HERE IN THE OFFICE--CHECK LIPIDS--PLEASE COME FASTING TO THIS LAB APPOINTMENT  If you have labs (blood work) drawn today and your tests are completely normal, you will receive your results only by: [place] (if you have MyChart) OR A paper copy in the mail If you have any lab test that is abnormal or we need to change your treatment, we will call you to review the results.   Testing/Procedures:  Your physician has requested that you have a lower extremity arterial duplex. This test is an ultrasound of the arteries in the legs or arms. It looks at arterial blood flow in the legs and arms. Allow one hour for Lower and Upper Arterial scans. There are no restrictions or special instructions  DO ABI's SAME TIME AS THIS TEST   Your physician has requested that you have an ankle brachial index (ABI). During this test an ultrasound and blood pressure cuff are used to evaluate the arteries that supply the arms and legs with blood. Allow thirty minutes for this exam. There are no restrictions or special instructions.  DO LE ARTERIAL US SAME TIME AS THIS TEST    Follow-Up: At [facility], you and your health needs are our priority.  As part of our  continuing mission to provide you with exceptional heart care, we have created designated Provider Care Teams.  These Care Teams include your primary Cardiologist (physician) and Advanced Practice Providers (APPs -  Physician Assistants and Nurse Practitioners) who all work together to provide you with the care you need, when you need it.  We recommend signing up for the patient portal called "MyChart".  Sign up information is provided on this After Visit Summary.  MyChart is used to connect with patients for Virtual Visits (Telemedicine).  Patients are able to view lab/test results, encounter notes, upcoming appointments, etc.  Non-urgent messages can be sent to your provider as well.   To learn more about what you can do with MyChart, go to [email or web address].    Your next appointment:   6 month(s)  The format for your next appointment:   In Person  Provider:   DR. Steffon Gladu       PLEASE SEND [place] A MYCHART MESSAGE OR CALL IN JANUARY TO LET us KNOW WHEN YOU ARE READY TO SCHEDULE YOUR STRESS TEST AND ECHO   Important Information About Sugar         Signed, Freada Bergeron, MD  02/19/2022 12:29 PM    [place]

## 2022-02-15 NOTE — Assessment & Plan Note (Signed)
Started on 02 12/23/21 2lpm hs and prn by Hospitalist - 01/05/2022 patient walked at a slow pace on room air x 3 laps each 150 ft. . Desat to 87% at end of third lap and reported legs being tired on lap 2. SOB on last lap -  02/14/2022   Walked on RA  x  3  lap(s) =  approx 450  ft  @ mod pace, stopped due to end of study s sob  with lowest 02 sats 94%    Clearly improved, no need for amb 02  - advised: Make sure you check your oxygen saturation  AT  your highest level of activity (not after you stop)   to be sure it stays over 90% and adjust  02 flow upward to maintain this level if needed but remember to turn it back to previous settings when you stop (to conserve your supply).   Each maintenance medication was reviewed in detail including emphasizing most importantly the difference between maintenance and prns and under what circumstances the prns are to be triggered using an action plan format where appropriate.  Total time for H and P, chart review, counseling, reviewing dpi/02 device(s) , directly observing portions of ambulatory 02 saturation study/ and generating customized AVS unique to this office visit / same day charting = 25 min

## 2022-02-19 ENCOUNTER — Ambulatory Visit: Payer: Medicare Other | Attending: Internal Medicine | Admitting: Cardiology

## 2022-02-19 ENCOUNTER — Encounter: Payer: Self-pay | Admitting: Cardiology

## 2022-02-19 VITALS — BP 143/92 | HR 82 | Ht 65.0 in | Wt 120.6 lb

## 2022-02-19 DIAGNOSIS — J449 Chronic obstructive pulmonary disease, unspecified: Secondary | ICD-10-CM

## 2022-02-19 DIAGNOSIS — I2510 Atherosclerotic heart disease of native coronary artery without angina pectoris: Secondary | ICD-10-CM | POA: Diagnosis not present

## 2022-02-19 DIAGNOSIS — I739 Peripheral vascular disease, unspecified: Secondary | ICD-10-CM

## 2022-02-19 DIAGNOSIS — E785 Hyperlipidemia, unspecified: Secondary | ICD-10-CM | POA: Diagnosis not present

## 2022-02-19 DIAGNOSIS — I10 Essential (primary) hypertension: Secondary | ICD-10-CM

## 2022-02-19 DIAGNOSIS — Z87891 Personal history of nicotine dependence: Secondary | ICD-10-CM | POA: Diagnosis not present

## 2022-02-19 DIAGNOSIS — Z79899 Other long term (current) drug therapy: Secondary | ICD-10-CM | POA: Diagnosis not present

## 2022-02-19 DIAGNOSIS — I700 Atherosclerosis of aorta: Secondary | ICD-10-CM | POA: Diagnosis not present

## 2022-02-19 MED ORDER — ROSUVASTATIN CALCIUM 20 MG PO TABS: 20.0000 mg | ORAL_TABLET | Freq: Every day | ORAL | 2 refills | Status: DC

## 2022-02-19 MED ORDER — LOSARTAN POTASSIUM 25 MG PO TABS: 25.0000 mg | ORAL_TABLET | Freq: Every day | ORAL | 2 refills | Status: DC

## 2022-02-19 NOTE — Patient Instructions (Signed)
Medication Instructions:   START TAKING ROSUVASTATIN (CRESTOR) 20 MG BY MOUTH DAILY  START TAKING LOSARTAN 25 MG BY MOUTH DAILY  *If you need a refill on your cardiac medications before your next appointment, please call your pharmacy*   Lab Work:  1.)  IN ONE WEEK HERE IN THE OFFICE--CHECK BMET AND LIPIDS--PLEASE COME FASTING  2.)  IN 8 WEEKS HERE IN THE OFFICE--CHECK LIPIDS--PLEASE COME FASTING TO THIS LAB APPOINTMENT  If you have labs (blood work) drawn today and your tests are completely normal, you will receive your results only by: [place] (if you have MyChart) OR A paper copy in the mail If you have any lab test that is abnormal or we need to change your treatment, we will call you to review the results.   Testing/Procedures:  Your physician has requested that you have a lower extremity arterial duplex. This test is an ultrasound of the arteries in the legs or arms. It looks at arterial blood flow in the legs and arms. Allow one hour for Lower and Upper Arterial scans. There are no restrictions or special instructions  DO ABI's SAME TIME AS THIS TEST   Your physician has requested that you have an ankle brachial index (ABI). During this test an ultrasound and blood pressure cuff are used to evaluate the arteries that supply the arms and legs with blood. Allow thirty minutes for this exam. There are no restrictions or special instructions.  DO LE ARTERIAL Desiree Barber SAME TIME AS THIS TEST    Follow-Up: At [facility], you and your health needs are our priority.  As part of our continuing mission to provide you with exceptional heart care, we have created designated Provider Care Teams.  These Care Teams include your primary Cardiologist (physician) and Advanced Practice Providers (APPs -  Physician Assistants and Nurse Practitioners) who all work together to provide you with the care you need, when you need it.  We recommend signing up for the patient portal called  "MyChart".  Sign up information is provided on this After Visit Summary.  MyChart is used to connect with patients for Virtual Visits (Telemedicine).  Patients are able to view lab/test results, encounter notes, upcoming appointments, etc.  Non-urgent messages can be sent to your provider as well.   To learn more about what you can do with MyChart, go to [email or web address].    Your next appointment:   6 month(s)  The format for your next appointment:   In Person  Provider:   DR. P[place]       PLEASE SEND Desiree Barber A MYCHART MESSAGE OR CALL IN JANUARY TO LET Desiree Barber KNOW WHEN YOU ARE READY TO SCHEDULE YOUR STRESS TEST AND ECHO   Important Information About Sugar

## 2022-02-21 ENCOUNTER — Ambulatory Visit (HOSPITAL_COMMUNITY)
Admission: RE | Admit: 2022-02-21 | Discharge: 2022-02-21 | Disposition: A | Discharge status: Home / Self Care | Payer: Medicare Other | Source: Ambulatory Visit | Attending: Cardiology | Admitting: Cardiology

## 2022-02-21 ENCOUNTER — Ambulatory Visit (HOSPITAL_COMMUNITY): Admission: RE | Admit: 2022-02-21 | Payer: Medicare Other | Source: Ambulatory Visit

## 2022-02-21 DIAGNOSIS — I739 Peripheral vascular disease, unspecified: Secondary | ICD-10-CM | POA: Diagnosis not present

## 2022-02-21 DIAGNOSIS — I70213 Atherosclerosis of native arteries of extremities with intermittent claudication, bilateral legs: Secondary | ICD-10-CM | POA: Diagnosis not present

## 2022-02-22 ENCOUNTER — Other Ambulatory Visit: Payer: Self-pay | Admitting: *Deleted

## 2022-02-22 DIAGNOSIS — R293 Abnormal posture: Secondary | ICD-10-CM | POA: Diagnosis not present

## 2022-02-22 DIAGNOSIS — M2569 Stiffness of other specified joint, not elsewhere classified: Secondary | ICD-10-CM | POA: Diagnosis not present

## 2022-02-22 DIAGNOSIS — M62512 Muscle wasting and atrophy, not elsewhere classified, left shoulder: Secondary | ICD-10-CM | POA: Diagnosis not present

## 2022-02-22 DIAGNOSIS — J449 Chronic obstructive pulmonary disease, unspecified: Secondary | ICD-10-CM | POA: Diagnosis not present

## 2022-02-22 DIAGNOSIS — I10 Essential (primary) hypertension: Secondary | ICD-10-CM | POA: Diagnosis not present

## 2022-02-22 DIAGNOSIS — I739 Peripheral vascular disease, unspecified: Secondary | ICD-10-CM

## 2022-02-22 DIAGNOSIS — Z720 Tobacco use: Secondary | ICD-10-CM | POA: Diagnosis not present

## 2022-02-22 DIAGNOSIS — M542 Cervicalgia: Secondary | ICD-10-CM | POA: Diagnosis not present

## 2022-02-22 NOTE — Progress Notes (Signed)
LEA doppler studies to be done at [facility]

## 2022-02-26 ENCOUNTER — Other Ambulatory Visit (HOSPITAL_COMMUNITY): Payer: Self-pay | Admitting: Family Medicine

## 2022-02-26 ENCOUNTER — Ambulatory Visit: Payer: Medicare Other | Admitting: Internal Medicine

## 2022-02-26 DIAGNOSIS — M542 Cervicalgia: Secondary | ICD-10-CM | POA: Diagnosis not present

## 2022-02-26 DIAGNOSIS — N6310 Unspecified lump in the right breast, unspecified quadrant: Secondary | ICD-10-CM

## 2022-02-26 DIAGNOSIS — R293 Abnormal posture: Secondary | ICD-10-CM | POA: Diagnosis not present

## 2022-02-26 DIAGNOSIS — M2569 Stiffness of other specified joint, not elsewhere classified: Secondary | ICD-10-CM | POA: Diagnosis not present

## 2022-02-26 DIAGNOSIS — M62512 Muscle wasting and atrophy, not elsewhere classified, left shoulder: Secondary | ICD-10-CM | POA: Diagnosis not present

## 2022-02-27 ENCOUNTER — Other Ambulatory Visit: Payer: Self-pay

## 2022-02-27 DIAGNOSIS — I739 Peripheral vascular disease, unspecified: Secondary | ICD-10-CM

## 2022-02-27 DIAGNOSIS — E785 Hyperlipidemia, unspecified: Secondary | ICD-10-CM

## 2022-02-27 DIAGNOSIS — Z79899 Other long term (current) drug therapy: Secondary | ICD-10-CM

## 2022-02-27 DIAGNOSIS — I2510 Atherosclerotic heart disease of native coronary artery without angina pectoris: Secondary | ICD-10-CM

## 2022-02-27 DIAGNOSIS — I10 Essential (primary) hypertension: Secondary | ICD-10-CM

## 2022-02-28 ENCOUNTER — Telehealth: Payer: Self-pay | Admitting: *Deleted

## 2022-02-28 ENCOUNTER — Encounter: Payer: Self-pay | Admitting: Internal Medicine

## 2022-02-28 ENCOUNTER — Ambulatory Visit (HOSPITAL_COMMUNITY)
Admission: RE | Admit: 2022-02-28 | Discharge: 2022-02-28 | Disposition: A | Discharge status: Home / Self Care | Payer: Medicare Other | Source: Ambulatory Visit | Attending: Cardiology | Admitting: Cardiology

## 2022-02-28 ENCOUNTER — Encounter: Payer: Self-pay | Admitting: *Deleted

## 2022-02-28 ENCOUNTER — Ambulatory Visit (INDEPENDENT_AMBULATORY_CARE_PROVIDER_SITE_OTHER): Payer: Medicare Other | Admitting: Internal Medicine

## 2022-02-28 VITALS — BP 146/80 | HR 80 | Temp 98.1°F | Ht 65.0 in | Wt 120.4 lb

## 2022-02-28 DIAGNOSIS — I739 Peripheral vascular disease, unspecified: Secondary | ICD-10-CM

## 2022-02-28 DIAGNOSIS — D509 Iron deficiency anemia, unspecified: Secondary | ICD-10-CM

## 2022-02-28 DIAGNOSIS — I70213 Atherosclerosis of native arteries of extremities with intermittent claudication, bilateral legs: Secondary | ICD-10-CM | POA: Diagnosis not present

## 2022-02-28 DIAGNOSIS — D125 Benign neoplasm of sigmoid colon: Secondary | ICD-10-CM | POA: Diagnosis not present

## 2022-02-28 DIAGNOSIS — R195 Other fecal abnormalities: Secondary | ICD-10-CM

## 2022-02-28 MED ORDER — PEG 3350-KCL-NA BICARB-NACL 420 G PO SOLR: 4000.0000 mL | Freq: Once | ORAL | 0 refills | Status: AC

## 2022-02-28 NOTE — Telephone Encounter (Signed)
-----   Message from Armando Gang sent at 02/27/2022  9:45 AM EST ----- Regarding: RE: LEA doppler 02-28-22 @ [facility]  ----- Message ----- From: Nuala Alpha, LPN Sent: 89/05/8099   7:33 AM EST To: Armando Gang; [facility] Subject: FW: LEA doppler                                Joseph can you please schedule appt for this pt for LEA doppler please? Shoot me the date thereafter?  Thanks  Renalda Locklin  ----- Message ----- From: Thompson Grayer, RN Sent: 02/22/2022   1:57 PM EST To: Nuala Alpha, LPN Subject: LEA doppler                                    Hopefully I put this order in right.  It is to be done at [facility].  I could not find that in one of the options so I put it in the notes.  Can you check and make sure it gets scheduled? Thanks

## 2022-02-28 NOTE — Addendum Note (Signed)
Addended by: Cheron Every on: 02/28/2022 09:27 AM   Modules accepted: Orders

## 2022-02-28 NOTE — Patient Instructions (Signed)
We will schedule you for upper endoscopy and colonoscopy to further evaluate your iron deficiency anemia, heme positive stool, history of polyps.  We will plan on doing this in mid January.  Continue on iron supplementation.  Continue to avoid NSAIDs as best as you can.  It was very nice meeting you today.  I hope you have a Merry Christmas.  Dr. Abbey Chatters

## 2022-02-28 NOTE — Progress Notes (Signed)
Primary Care Physician:  Sharilyn Sites, MD Primary Gastroenterologist:  Dr. Abbey Chatters  Chief Complaint  Patient presents with   Anemia    New patient. Referred for IDA. Reports she has not seen any blood in her stool. She stopped taking goody powders which is what she thinks caused the bleeding.     HPI:   Desiree Barber is a 71 y.o. female who presents to the clinic today by referral from her  Tarri Abernethy for evaluation.  Patient with iron deficiency anemia, baseline hemoglobin around 10.  Started on iron supplementation beginning of November.  Denies any melena hematochezia.  Stool testing positive for heme.  Last colonoscopy 2017 with 1 tubular adenoma removed.  Due for surveillance.  History of chronic Goody powder use.  States she stopped this approximately 2 months ago.  Denies any acid reflux or heartburn.  No epigastric or chest pain.  No dysphagia odynophagia.  Denies any family history of colorectal malignancy.  Past Medical History:  Diagnosis Date   Anxiety    Occasional   Arthritis    COPD (chronic obstructive pulmonary disease) (HCC)    HTN (hypertension)     Past Surgical History:  Procedure Laterality Date   COLONOSCOPY  02/23/04   RMR: normal rectum and colon.minimal internal hemorrhoids   COLONOSCOPY WITH PROPOFOL N/A 06/13/2015   Procedure: COLONOSCOPY WITH PROPOFOL;  Surgeon: Daneil Dolin, MD;  Location: [facility];  Service: Endoscopy;  Laterality: N/A;  1045   POLYPECTOMY  06/13/2015   Procedure: POLYPECTOMY;  Surgeon: Daneil Dolin, MD;  Location: [facility];  Service: Endoscopy;;  sigmoid colon polyp   TOOTH EXTRACTION     TRIGGER FINGER RELEASE Bilateral    TUBAL LIGATION      Current Outpatient Medications  Medication Sig Dispense Refill   ALPRAZolam (XANAX) 0.25 MG tablet Take 0.25 mg by mouth daily as needed for anxiety or sleep.     Ascorbic Acid (VITAMIN C) 100 MG tablet Take 100 mg by mouth daily.     ferrous sulfate 325 (65  FE) MG EC tablet Take 325 mg by mouth daily.     losartan (COZAAR) 25 MG tablet Take 1 tablet (25 mg total) by mouth daily. 90 tablet 2   rosuvastatin (CRESTOR) 20 MG tablet Take 1 tablet (20 mg total) by mouth daily. 90 tablet 2   umeclidinium-vilanterol (ANORO ELLIPTA) 62.5-25 MCG/ACT AEPB Inhale 1 puff into the lungs daily. 1 each 11   vitamin B-12 (CYANOCOBALAMIN) 100 MCG tablet Take 100 mcg by mouth daily.     VITAMIN D, CHOLECALCIFEROL, PO Take by mouth. One daily     No current facility-administered medications for this visit.    Allergies as of 02/28/2022 - Review Complete 02/28/2022  Allergen Reaction Noted   Demerol [meperidine] Other (See Comments) 05/03/2015   Oxycontin [oxycodone hcl] Other (See Comments) 05/03/2015   Fish allergy Nausea Only 06/13/2015   Lavender oil  02/28/2022   Other  02/28/2022    Family History  Problem Relation Age of Onset   Colon cancer Neg Hx    Breast cancer Neg Hx     Social History   Socioeconomic History   Marital status: Married    Spouse name: Not on file   Number of children: Not on file   Years of education: Not on file   Highest education level: Not on file  Occupational History   Not on file  Tobacco Use   Smoking status:  Former    Packs/day: 0.50    Years: 55.00    Total pack years: 27.50    Types: Cigarettes    Quit date: 05/29/2021    Years since quitting: 0.7    Passive exposure: Past   Smokeless tobacco: Former   Tobacco comments:    Quit date 05/29/2021  Vaping Use   Vaping Use: Never used  Substance and Sexual Activity   Alcohol use: Yes    Alcohol/week: 0.0 standard drinks of alcohol    Comment: 3 beers a day   Drug use: No   Sexual activity: Yes    Birth control/protection: Surgical  Other Topics Concern   Not on file  Social History Narrative   Not on file   Social Determinants of Health   Financial Resource Strain: Not on file  Food Insecurity: Unknown (12/24/2021)   Hunger Vital Sign    Worried  About Running Out of Food in the Last Year: Never true    Ran Out of Food in the Last Year: Not on file  Transportation Needs: No Transportation Needs (12/24/2021)   PRAPARE - Hydrologist (Medical): No    Lack of Transportation (Non-Medical): No  Physical Activity: Not on file  Stress: Not on file  Social Connections: Not on file  Intimate Partner Violence: Not At Risk (12/24/2021)   Humiliation, Afraid, Rape, and Kick questionnaire    Fear of Current or Ex-Partner: No    Emotionally Abused: No    Physically Abused: No    Sexually Abused: No    Subjective: Review of Systems  Constitutional:  Negative for chills and fever.  HENT:  Negative for congestion and hearing loss.   Eyes:  Negative for blurred vision and double vision.  Respiratory:  Negative for cough and shortness of breath.   Cardiovascular:  Negative for chest pain and palpitations.  Gastrointestinal:  Negative for abdominal pain, blood in stool, constipation, diarrhea, heartburn, melena and vomiting.  Genitourinary:  Negative for dysuria and urgency.  Musculoskeletal:  Negative for joint pain and myalgias.  Skin:  Negative for itching and rash.  Neurological:  Negative for dizziness and headaches.  Psychiatric/Behavioral:  Negative for depression. The patient is not nervous/anxious.        Objective: BP (!) 146/80 (BP Location: Left Arm, Patient Position: Sitting, Cuff Size: Normal) Comment: recheck 134/81  Pulse 80   Temp 98.1 F (36.7 C) (Oral)   Ht '5\' 5"'$  (1.651 m)   Wt 120 lb 6.4 oz (54.6 kg)   BMI 20.04 kg/m  Physical Exam Constitutional:      Appearance: Normal appearance.  HENT:     Head: Normocephalic and atraumatic.  Eyes:     Extraocular Movements: Extraocular movements intact.     Conjunctiva/sclera: Conjunctivae normal.  Cardiovascular:     Rate and Rhythm: Normal rate and regular rhythm.  Pulmonary:     Effort: Pulmonary effort is normal.     Breath sounds:  Normal breath sounds.  Abdominal:     General: Bowel sounds are normal.     Palpations: Abdomen is soft.  Musculoskeletal:        General: No swelling. Normal range of motion.     Cervical back: Normal range of motion and neck supple.  Skin:    General: Skin is warm and dry.     Coloration: Skin is not jaundiced.  Neurological:     General: No focal deficit present.     Mental Status:  She is alert and oriented to person, place, and time.  Psychiatric:        Mood and Affect: Mood normal.        Behavior: Behavior normal.      Assessment: *Iron deficiency anemia *Heme positive stool *Adenomatous colon polyp 2017  Plan: Will schedule for EGD to evaluate for peptic ulcer disease, esophagitis, gastritis, H. Pylori, duodenitis, or other. Will also evaluate for esophageal stricture, Schatzki's ring, esophageal web or other.   At the same time will perform colonoscopy for history of polyps, heme positive stool, iron deficiency.   The risks including infection, bleed, or perforation as well as benefits, limitations, alternatives and imponderables have been reviewed with the patient. Potential for esophageal dilation, biopsy, etc. have also been reviewed.  Questions have been answered. All parties agreeable.  Continue on iron supplementation.  Thank you Tarri Abernethy for the kind referral.   02/28/2022 8:43 AM   Disclaimer: This note was dictated with voice recognition software. Similar sounding words can inadvertently be transcribed and may not be corrected upon review.

## 2022-03-01 ENCOUNTER — Telehealth: Payer: Self-pay

## 2022-03-01 ENCOUNTER — Other Ambulatory Visit (HOSPITAL_COMMUNITY)
Admission: RE | Admit: 2022-03-01 | Discharge: 2022-03-01 | Disposition: A | Discharge status: Home / Self Care | Payer: Medicare Other | Source: Ambulatory Visit | Attending: Cardiology | Admitting: Cardiology

## 2022-03-01 DIAGNOSIS — I10 Essential (primary) hypertension: Secondary | ICD-10-CM | POA: Diagnosis not present

## 2022-03-01 DIAGNOSIS — Z79899 Other long term (current) drug therapy: Secondary | ICD-10-CM | POA: Diagnosis not present

## 2022-03-01 DIAGNOSIS — M2569 Stiffness of other specified joint, not elsewhere classified: Secondary | ICD-10-CM | POA: Diagnosis not present

## 2022-03-01 DIAGNOSIS — E785 Hyperlipidemia, unspecified: Secondary | ICD-10-CM | POA: Insufficient documentation

## 2022-03-01 DIAGNOSIS — I739 Peripheral vascular disease, unspecified: Secondary | ICD-10-CM

## 2022-03-01 DIAGNOSIS — I2510 Atherosclerotic heart disease of native coronary artery without angina pectoris: Secondary | ICD-10-CM | POA: Insufficient documentation

## 2022-03-01 DIAGNOSIS — M542 Cervicalgia: Secondary | ICD-10-CM | POA: Diagnosis not present

## 2022-03-01 DIAGNOSIS — M62512 Muscle wasting and atrophy, not elsewhere classified, left shoulder: Secondary | ICD-10-CM | POA: Diagnosis not present

## 2022-03-01 DIAGNOSIS — R293 Abnormal posture: Secondary | ICD-10-CM | POA: Diagnosis not present

## 2022-03-01 LAB — LIPID PANEL
Cholesterol: 150 mg/dL (ref 0–200)
HDL: 77 mg/dL (ref >40)
LDL Cholesterol: 65 mg/dL (ref 0–99)
Total CHOL/HDL Ratio: 1.9 RATIO
Triglycerides: 38 mg/dL (ref <150)
VLDL: 8 mg/dL (ref 0–40)

## 2022-03-01 LAB — BASIC METABOLIC PANEL
Anion gap: 8 (ref 5–15)
BUN: 11 mg/dL (ref 8–23)
CO2: 28 mmol/L (ref 22–32)
Calcium: 8.8 mg/dL — ABNORMAL LOW (ref 8.9–10.3)
Chloride: 101 mmol/L (ref 98–111)
Creatinine, Ser: 0.79 mg/dL (ref 0.44–1.00)
GFR, Estimated: >60 mL/min (ref >60)
Glucose, Bld: 97 mg/dL (ref 70–99)
Potassium: 4.3 mmol/L (ref 3.5–5.1)
Sodium: 137 mmol/L (ref 135–145)

## 2022-03-01 NOTE — Telephone Encounter (Signed)
Results discussed with patient, referred to [facility],copied pcp.

## 2022-03-01 NOTE — Telephone Encounter (Signed)
-----   Message from Nuala Alpha, LPN sent at 16/03/958 12:58 PM EST ----- [name] pt  ----- Message ----- From: Freada Bergeron, MD Sent: 03/01/2022  12:57 PM EST To: Nuala Alpha, LPN  Her LE dopplers show possible disease in her leg arteries. To evaluate this further, can we refer her to vascular surgery for further imaging and evaluation?

## 2022-03-05 DIAGNOSIS — R293 Abnormal posture: Secondary | ICD-10-CM | POA: Diagnosis not present

## 2022-03-05 DIAGNOSIS — M542 Cervicalgia: Secondary | ICD-10-CM | POA: Diagnosis not present

## 2022-03-05 DIAGNOSIS — M2569 Stiffness of other specified joint, not elsewhere classified: Secondary | ICD-10-CM | POA: Diagnosis not present

## 2022-03-05 DIAGNOSIS — M62512 Muscle wasting and atrophy, not elsewhere classified, left shoulder: Secondary | ICD-10-CM | POA: Diagnosis not present

## 2022-03-06 ENCOUNTER — Other Ambulatory Visit: Payer: Self-pay

## 2022-03-06 DIAGNOSIS — I739 Peripheral vascular disease, unspecified: Secondary | ICD-10-CM

## 2022-03-07 ENCOUNTER — Ambulatory Visit (INDEPENDENT_AMBULATORY_CARE_PROVIDER_SITE_OTHER): Payer: Medicare Other | Admitting: Vascular Surgery

## 2022-03-07 ENCOUNTER — Encounter: Payer: Self-pay | Admitting: Vascular Surgery

## 2022-03-07 VITALS — BP 117/72 | HR 87 | Temp 98.1°F | Ht 65.0 in | Wt 121.6 lb

## 2022-03-07 DIAGNOSIS — I2510 Atherosclerotic heart disease of native coronary artery without angina pectoris: Secondary | ICD-10-CM | POA: Diagnosis not present

## 2022-03-07 DIAGNOSIS — I2584 Coronary atherosclerosis due to calcified coronary lesion: Secondary | ICD-10-CM

## 2022-03-07 DIAGNOSIS — I739 Peripheral vascular disease, unspecified: Secondary | ICD-10-CM

## 2022-03-07 NOTE — Progress Notes (Signed)
[facility] [place]  Patient name: Desiree Barber MRN: [id number] DOB: 05-Oct-1950 Sex: female  REASON FOR CONSULT: Evaluation limiting claudication right leg  HPI: Desiree Barber is a 71 y.o. female, who is here today for evaluation.  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  This has been present for a time but has become progressive and intolerable.  She can have occasional mild symptoms in her left leg but this is much more severe in the right.  Ports that it involves her whole leg and it is relieved with rest.  She has no rest pain.  She has no tissue loss.  She did quit smoking cigarettes approximately 9 months ago after smoking for 80 years  Past Medical History:  Diagnosis Date   Anemia    Anxiety    Occasional   Arthritis    COPD (chronic obstructive pulmonary disease) (HCC)    HTN (hypertension)     Family History  Problem Relation Age of Onset   Colon cancer Neg Hx    Breast cancer Neg Hx     SOCIAL HISTORY: Social History   Socioeconomic History   Marital status: Married    Spouse name: Not on file   Number of children: Not on file   Years of education: Not on file   Highest education level: Not on file  Occupational History   Not on file  Tobacco Use   Smoking status: Former    Packs/day: 0.50    Years: 55.00    Total pack years: 27.50    Types: Cigarettes    Quit date: 05/29/2021    Years since quitting: 0.7    Passive exposure: Past   Smokeless tobacco: Former   Tobacco comments:    Quit date 05/29/2021  Vaping Use   Vaping Use: Never used  Substance and Sexual Activity   Alcohol use: Yes    Alcohol/week: 0.0 standard drinks of alcohol    Comment: 3 beers a day   Drug use: No   Sexual activity: Yes    Birth control/protection: Surgical  Other Topics Concern   Not on file  Social History Narrative   Not on file   Social  Determinants of Health   Financial Resource Strain: Not on file  Food Insecurity: Unknown (12/24/2021)   Hunger Vital Sign    Worried About Running Out of Food in the Last Year: Never true    Ran Out of Food in the Last Year: Not on file  Transportation Needs: No Transportation Needs (12/24/2021)   PRAPARE - Hydrologist (Medical): No    Lack of Transportation (Non-Medical): No  Physical Activity: Not on file  Stress: Not on file  Social Connections: Not on file  Intimate Partner Violence: Not At Risk (12/24/2021)   Humiliation, Afraid, Rape, and Kick questionnaire    Fear of Current or Ex-Partner: No    Emotionally Abused: No    Physically Abused: No    Sexually Abused: No    Allergies  Allergen Reactions   Demerol [Meperidine] Other (See Comments)    Unknown   Oxycontin [Oxycodone Hcl] Other (See Comments)    Unknown   Fish Allergy Nausea Only    Only fish from [place]   Lavender Oil    Other     Flower oils     Current Outpatient Medications  Medication Sig Dispense Refill  albuterol (VENTOLIN HFA) 108 (90 Base) MCG/ACT inhaler Inhale into the lungs.     ALPRAZolam (XANAX) 0.25 MG tablet Take 0.25 mg by mouth daily as needed for anxiety or sleep.     Ascorbic Acid (VITAMIN C) 100 MG tablet Take 100 mg by mouth daily.     ferrous sulfate 325 (65 FE) MG EC tablet Take 325 mg by mouth daily.     losartan (COZAAR) 25 MG tablet Take 1 tablet (25 mg total) by mouth daily. 90 tablet 2   rosuvastatin (CRESTOR) 20 MG tablet Take 1 tablet (20 mg total) by mouth daily. 90 tablet 2   umeclidinium-vilanterol (ANORO ELLIPTA) 62.5-25 MCG/ACT AEPB Inhale 1 puff into the lungs daily. 1 each 11   vitamin B-12 (CYANOCOBALAMIN) 100 MCG tablet Take 100 mcg by mouth daily.     VITAMIN D, CHOLECALCIFEROL, PO Take by mouth. One daily     No current facility-administered medications for this visit.    REVIEW OF SYSTEMS:  '[X]'$  denotes positive finding, '[ ]'$   denotes negative finding Cardiac  Comments:  Chest pain or chest pressure:    Shortness of breath upon exertion: x   Short of breath when lying flat:    Irregular heart rhythm:        Vascular    Pain in calf, thigh, or hip brought on by ambulation: x   Pain in feet at night that wakes you up from your sleep:     Blood clot in your veins:    Leg swelling:         Pulmonary    Oxygen at home: x   Productive cough:     Wheezing:         Neurologic    Sudden weakness in arms or legs:     Sudden numbness in arms or legs:     Sudden onset of difficulty speaking or slurred speech:    Temporary loss of vision in one eye:     Problems with dizziness:         Gastrointestinal    Blood in stool:     Vomited blood:         Genitourinary    Burning when urinating:     Blood in urine:        Psychiatric    Major depression:         Hematologic    Bleeding problems:    Problems with blood clotting too easily:        Skin    Rashes or ulcers:        Constitutional    Fever or chills:      PHYSICAL EXAM: Vitals:   03/07/22 1032  BP: 117/72  Pulse: 87  Temp: 98.1 F (36.7 C)  SpO2: 95%  Weight: 121 lb 9.6 oz (55.2 kg)  Height: '5\' 5"'$  (1.651 m)    GENERAL: The patient is a well-nourished female, in no acute distress. The vital signs are documented above. CARDIOVASCULAR: 2+ radial pulses bilaterally.  1-2+ femoral pulses bilaterally.  1+ dorsalis pedis pulses bilaterally. PULMONARY: There is good air exchange  MUSCULOSKELETAL: There are no major deformities or cyanosis. NEUROLOGIC: No focal weakness or paresthesias are detected. SKIN: There are no ulcers or rashes noted. PSYCHIATRIC: The patient has a normal affect.  DATA:  Noninvasive studies at [facility] on 02/28/2022 suggested ankle arm index of 0.77 on the right and 0.91 on the left  MEDICAL ISSUES: Bilateral lower extremity claudication much  worse in the right leg than on the left.  She reports this is  a very difficult for her to do her activities such as walking which she enjoys.  She also has significant pulmonary disease and is been instructed for a walking program for this as well.  Invasive duplex of her arterial tree suggest proximal disease more significant on the right than on the left.  Have suggested arteriogram with runoff and possible intervention.  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  She understands this would be done as an outpatient at [facility] with one of my partners.  We will coordinate this at her convenience   Rosetta Posner, MD [facility] Tel [phone] Pager [phone]  Note: Portions of this report may have been transcribed using voice recognition software.  Every effort has been made to ensure accuracy; however, inadvertent computerized transcription errors may still be present.

## 2022-03-08 DIAGNOSIS — R293 Abnormal posture: Secondary | ICD-10-CM | POA: Diagnosis not present

## 2022-03-08 DIAGNOSIS — M2569 Stiffness of other specified joint, not elsewhere classified: Secondary | ICD-10-CM | POA: Diagnosis not present

## 2022-03-08 DIAGNOSIS — M542 Cervicalgia: Secondary | ICD-10-CM | POA: Diagnosis not present

## 2022-03-08 DIAGNOSIS — M62512 Muscle wasting and atrophy, not elsewhere classified, left shoulder: Secondary | ICD-10-CM | POA: Diagnosis not present

## 2022-03-09 ENCOUNTER — Other Ambulatory Visit: Payer: Self-pay

## 2022-03-09 DIAGNOSIS — I739 Peripheral vascular disease, unspecified: Secondary | ICD-10-CM

## 2022-03-12 DIAGNOSIS — M2569 Stiffness of other specified joint, not elsewhere classified: Secondary | ICD-10-CM | POA: Diagnosis not present

## 2022-03-12 DIAGNOSIS — M62512 Muscle wasting and atrophy, not elsewhere classified, left shoulder: Secondary | ICD-10-CM | POA: Diagnosis not present

## 2022-03-12 DIAGNOSIS — M542 Cervicalgia: Secondary | ICD-10-CM | POA: Diagnosis not present

## 2022-03-12 DIAGNOSIS — R293 Abnormal posture: Secondary | ICD-10-CM | POA: Diagnosis not present

## 2022-03-13 ENCOUNTER — Ambulatory Visit (HOSPITAL_COMMUNITY)
Admission: RE | Admit: 2022-03-13 | Discharge: 2022-03-13 | Disposition: A | Discharge status: Home / Self Care | Payer: Medicare Other | Source: Ambulatory Visit | Attending: Family Medicine | Admitting: Family Medicine

## 2022-03-13 DIAGNOSIS — R92333 Mammographic heterogeneous density, bilateral breasts: Secondary | ICD-10-CM | POA: Diagnosis not present

## 2022-03-13 DIAGNOSIS — N6310 Unspecified lump in the right breast, unspecified quadrant: Secondary | ICD-10-CM

## 2022-03-13 DIAGNOSIS — Z1239 Encounter for other screening for malignant neoplasm of breast: Secondary | ICD-10-CM | POA: Diagnosis not present

## 2022-03-13 DIAGNOSIS — R922 Inconclusive mammogram: Secondary | ICD-10-CM | POA: Diagnosis not present

## 2022-03-14 ENCOUNTER — Telehealth: Payer: Self-pay | Admitting: *Deleted

## 2022-03-14 ENCOUNTER — Encounter: Payer: Self-pay | Admitting: *Deleted

## 2022-03-14 DIAGNOSIS — R293 Abnormal posture: Secondary | ICD-10-CM | POA: Diagnosis not present

## 2022-03-14 DIAGNOSIS — M2569 Stiffness of other specified joint, not elsewhere classified: Secondary | ICD-10-CM | POA: Diagnosis not present

## 2022-03-14 DIAGNOSIS — M542 Cervicalgia: Secondary | ICD-10-CM | POA: Diagnosis not present

## 2022-03-14 DIAGNOSIS — M62512 Muscle wasting and atrophy, not elsewhere classified, left shoulder: Secondary | ICD-10-CM | POA: Diagnosis not present

## 2022-03-14 NOTE — Telephone Encounter (Signed)
Pt called and needed to reschedule her procedure from 04/09/22 until later because she is having a procedure done on her legs on 04/10/22. Pt was rescheduled until 04/23/22 at 8 am. New instructions mailed to pt

## 2022-03-20 DIAGNOSIS — M542 Cervicalgia: Secondary | ICD-10-CM | POA: Diagnosis not present

## 2022-03-20 DIAGNOSIS — R293 Abnormal posture: Secondary | ICD-10-CM | POA: Diagnosis not present

## 2022-03-20 DIAGNOSIS — M62512 Muscle wasting and atrophy, not elsewhere classified, left shoulder: Secondary | ICD-10-CM | POA: Diagnosis not present

## 2022-03-20 DIAGNOSIS — M2569 Stiffness of other specified joint, not elsewhere classified: Secondary | ICD-10-CM | POA: Diagnosis not present

## 2022-03-21 ENCOUNTER — Inpatient Hospital Stay: Payer: Medicare Other

## 2022-03-22 DIAGNOSIS — M542 Cervicalgia: Secondary | ICD-10-CM | POA: Diagnosis not present

## 2022-03-22 DIAGNOSIS — R293 Abnormal posture: Secondary | ICD-10-CM | POA: Diagnosis not present

## 2022-03-22 DIAGNOSIS — M62512 Muscle wasting and atrophy, not elsewhere classified, left shoulder: Secondary | ICD-10-CM | POA: Diagnosis not present

## 2022-03-22 DIAGNOSIS — M2569 Stiffness of other specified joint, not elsewhere classified: Secondary | ICD-10-CM | POA: Diagnosis not present

## 2022-03-23 ENCOUNTER — Inpatient Hospital Stay: Payer: Medicare Other | Attending: Hematology

## 2022-03-23 DIAGNOSIS — D75839 Thrombocytosis, unspecified: Secondary | ICD-10-CM | POA: Insufficient documentation

## 2022-03-23 DIAGNOSIS — D509 Iron deficiency anemia, unspecified: Secondary | ICD-10-CM | POA: Insufficient documentation

## 2022-03-23 LAB — CBC WITH DIFFERENTIAL/PLATELET
Abs Immature Granulocytes: 0.05 10*3/uL (ref 0.00–0.07)
Basophils Absolute: 0.1 10*3/uL (ref 0.0–0.1)
Basophils Relative: 0 %
Eosinophils Absolute: 0.2 10*3/uL (ref 0.0–0.5)
Eosinophils Relative: 2 %
HCT: 33.7 % — ABNORMAL LOW (ref 36.0–46.0)
Hemoglobin: 10.2 g/dL — ABNORMAL LOW (ref 12.0–15.0)
Immature Granulocytes: 0 %
Lymphocytes Relative: 16 %
Lymphs Abs: 2 10*3/uL (ref 0.7–4.0)
MCH: 27.4 pg (ref 26.0–34.0)
MCHC: 30.3 g/dL (ref 30.0–36.0)
MCV: 90.6 fL (ref 80.0–100.0)
Monocytes Absolute: 1.5 10*3/uL — ABNORMAL HIGH (ref 0.1–1.0)
Monocytes Relative: 12 %
Neutro Abs: 8.5 10*3/uL — ABNORMAL HIGH (ref 1.7–7.7)
Neutrophils Relative %: 70 %
Platelets: 457 10*3/uL — ABNORMAL HIGH (ref 150–400)
RBC: 3.72 MIL/uL — ABNORMAL LOW (ref 3.87–5.11)
RDW: 18 % — ABNORMAL HIGH (ref 11.5–15.5)
WBC: 12.3 10*3/uL — ABNORMAL HIGH (ref 4.0–10.5)
nRBC: 0 % (ref 0.0–0.2)

## 2022-03-23 LAB — IRON AND TIBC
Iron: 16 ug/dL — ABNORMAL LOW (ref 28–170)
Saturation Ratios: 3 % — ABNORMAL LOW (ref 10.4–31.8)
TIBC: 472 ug/dL — ABNORMAL HIGH (ref 250–450)
UIBC: 456 ug/dL

## 2022-03-23 LAB — FERRITIN: Ferritin: 6 ng/mL — ABNORMAL LOW (ref 11–307)

## 2022-03-27 NOTE — Progress Notes (Signed)
[facility] [address] [place], [place] [address]   CLINIC:  Medical Oncology/Hematology  PCP:  Assunta Found, MD [address] [place] [address] [phone]   REASON FOR VISIT:  Follow-up for leukocytosis and thrombocytosis, iron deficiency anemia  PRIOR THERAPY: None  CURRENT THERAPY: Oral iron supplementation  INTERVAL HISTORY:  Desiree Barber 72 y.o. female returns for routine follow-up of her leukocytosis and thrombocytosis.  She was last seen by Rojelio Brenner PA-C on 01/24/2022.    At today's visit, she reports feeling fair apart from chronic pains.  She denies any major changes in her health since her last visit.  She continues to deny any fevers, chills, night sweats, unintentional weight loss.  No recurrent infections.  No history of thrombosis, aquagenic pruritus, or vasomotor symptoms.    She continues to use Trelegy (steroid containing inhaler) daily for her COPD, and has required systemic steroids on multiple occasions, most recently completed prednisone taper on 01/03/2022.    Regarding her recently noted iron deficiency anemia, she denies any melena, rectal bleeding, or other signs of abnormal blood loss.  She previously took [place] powder twice daily, but has cut back since her last visit; reports that she now only uses ibuprofen on rare occasions (proximately 6 times over the past 2 months).  She has been taking iron tablet 3 times weekly.  She reports fatigue with energy about 50%.  Occasional chest pain that she attributes to acid reflux.  Baseline dyspnea on exertion from COPD.  No pica, lightheadedness, syncope, or headaches.  Ongoing symptoms of leg claudication and sciatic back pain.  She has 50% energy and 100% appetite. She endorses that she is maintaining a stable weight.   REVIEW OF SYSTEMS:  Review of Systems  Constitutional:  Positive for fatigue. Negative for appetite change, chills, diaphoresis, fever and unexpected  weight change.  HENT:   Negative for lump/mass and nosebleeds.   Eyes:  Negative for eye problems.  Respiratory:  Positive for shortness of breath (With exertion, COPD). Negative for cough and hemoptysis.   Cardiovascular:  Positive for chest pain (Acid reflux per patient). Negative for leg swelling and palpitations.  Gastrointestinal:  Negative for abdominal pain, blood in stool, constipation, diarrhea, nausea and vomiting.  Genitourinary:  Negative for hematuria.   Musculoskeletal:  Positive for back pain and myalgias (Leg pain with exertion).  Skin: Negative.   Neurological:  Positive for numbness. Negative for dizziness, headaches and light-headedness.  Hematological:  Does not bruise/bleed easily.      PAST MEDICAL/SURGICAL HISTORY:  Past Medical History:  Diagnosis Date   Anemia    Anxiety    Occasional   Arthritis    COPD (chronic obstructive pulmonary disease) (HCC)    HTN (hypertension)    Past Surgical History:  Procedure Laterality Date   COLONOSCOPY  02/23/04   RMR: normal rectum and colon.minimal internal hemorrhoids   COLONOSCOPY WITH PROPOFOL N/A 06/13/2015   Procedure: COLONOSCOPY WITH PROPOFOL;  Surgeon: Corbin Ade, MD;  [facility];  Service: Endoscopy;  Laterality: N/A;  1045   POLYPECTOMY  06/13/2015   Procedure: POLYPECTOMY;  Surgeon: Corbin Ade, MD;  Location: [facility];  Service: Endoscopy;;  sigmoid colon polyp   TOOTH EXTRACTION     TRIGGER FINGER RELEASE Bilateral    TUBAL LIGATION       SOCIAL HISTORY:  Social History   Socioeconomic History   Marital status: Married    Spouse name: Not on  file   Number of children: Not on file   Years of education: Not on file   Highest education level: Not on file  Occupational History   Not on file  Tobacco Use   Smoking status: Former    Packs/day: 0.50    Years: 55.00    Total pack years: 27.50    Types: Cigarettes    Quit date: 05/29/2021    Years since quitting: 0.8     Passive exposure: Past   Smokeless tobacco: Former   Tobacco comments:    Quit date 05/29/2021  Vaping Use   Vaping Use: Never used  Substance and Sexual Activity   Alcohol use: Yes    Alcohol/week: 0.0 standard drinks of alcohol    Comment: 3 beers a day   Drug use: No   Sexual activity: Yes    Birth control/protection: Surgical  Other Topics Concern   Not on file  Social History Narrative   Not on file   Social Determinants of Health   Financial Resource Strain: Not on file  Food Insecurity: Unknown (12/24/2021)   Hunger Vital Sign    Worried About Running Out of Food in the Last Year: Never true    Ran Out of Food in the Last Year: Not on file  Transportation Needs: No Transportation Needs (12/24/2021)   PRAPARE - Administrator, Civil Service (Medical): No    Lack of Transportation (Non-Medical): No  Physical Activity: Not on file  Stress: Not on file  Social Connections: Not on file  Intimate Partner Violence: Not At Risk (12/24/2021)   Humiliation, Afraid, Rape, and Kick questionnaire    Fear of Current or Ex-Partner: No    Emotionally Abused: No    Physically Abused: No    Sexually Abused: No    FAMILY HISTORY:  Family History  Problem Relation Age of Onset   Colon cancer Neg Hx    Breast cancer Neg Hx     CURRENT MEDICATIONS:  Outpatient Encounter Medications as of 03/28/2022  Medication Sig   albuterol (VENTOLIN HFA) 108 (90 Base) MCG/ACT inhaler Inhale into the lungs.   ALPRAZolam (XANAX) 0.25 MG tablet Take 0.25 mg by mouth daily as needed for anxiety or sleep.   Ascorbic Acid (VITAMIN C) 100 MG tablet Take 100 mg by mouth daily.   ferrous sulfate 325 (65 FE) MG EC tablet Take 325 mg by mouth daily.   losartan (COZAAR) 25 MG tablet Take 1 tablet (25 mg total) by mouth daily.   rosuvastatin (CRESTOR) 20 MG tablet Take 1 tablet (20 mg total) by mouth daily.   umeclidinium-vilanterol (ANORO ELLIPTA) 62.5-25 MCG/ACT AEPB Inhale 1 puff into the lungs  daily.   vitamin B-12 (CYANOCOBALAMIN) 100 MCG tablet Take 100 mcg by mouth daily.   VITAMIN D, CHOLECALCIFEROL, PO Take by mouth. One daily   No facility-administered encounter medications on file as of 03/28/2022.    ALLERGIES:  Allergies  Allergen Reactions   Demerol [Meperidine] Other (See Comments)    Unknown   Oxycontin [Oxycodone Hcl] Other (See Comments)    Unknown   Fish Allergy Nausea Only    Only fish from [place]   Lavender Oil    Other     Flower oils      PHYSICAL EXAM:  ECOG PERFORMANCE STATUS: 1 - Symptomatic but completely ambulatory  There were no vitals filed for this visit. There were no vitals filed for this visit. Physical Exam Constitutional:  Appearance: Normal appearance.  HENT:     Head: Normocephalic and atraumatic.     Mouth/Throat:     Mouth: Mucous membranes are moist.  Eyes:     Extraocular Movements: Extraocular movements intact.     Pupils: Pupils are equal, round, and reactive to light.  Cardiovascular:     Rate and Rhythm: Normal rate and regular rhythm.     Pulses:          Dorsalis pedis pulses are 1+ on the right side and 1+ on the left side.       Posterior tibial pulses are 1+ on the right side and 1+ on the left side.     Heart sounds: Normal heart sounds.  Pulmonary:     Effort: Pulmonary effort is normal.     Breath sounds: Normal breath sounds. Decreased air movement present.  Abdominal:     General: Bowel sounds are normal.     Palpations: Abdomen is soft.     Tenderness: There is no abdominal tenderness.  Musculoskeletal:        General: No swelling.     Right lower leg: No edema.     Left lower leg: No edema.  Lymphadenopathy:     Cervical: No cervical adenopathy.  Skin:    General: Skin is warm and dry.  Neurological:     General: No focal deficit present.     Mental Status: She is alert and oriented to person, place, and time.  Psychiatric:        Mood and Affect: Mood normal.        Behavior:  Behavior normal.     LABORATORY DATA:  I have reviewed the labs as listed.  CBC    Component Value Date/Time   WBC 12.3 (H) 03/23/2022 0803   RBC 3.72 (L) 03/23/2022 0803   HGB 10.2 (L) 03/23/2022 0803   HCT 33.7 (L) 03/23/2022 0803   PLT 457 (H) 03/23/2022 0803   MCV 90.6 03/23/2022 0803   MCH 27.4 03/23/2022 0803   MCHC 30.3 03/23/2022 0803   RDW 18.0 (H) 03/23/2022 0803   LYMPHSABS 2.0 03/23/2022 0803   MONOABS 1.5 (H) 03/23/2022 0803   EOSABS 0.2 03/23/2022 0803   BASOSABS 0.1 03/23/2022 0803      Latest Ref Rng & Units 03/01/2022    8:41 AM 12/24/2021    4:54 AM 12/23/2021    8:40 PM  CMP  Glucose 70 - 99 mg/dL 97  865  784   BUN 8 - 23 mg/dL 11  7  10    Creatinine 0.44 - 1.00 mg/dL 6.96  2.95  2.84   Sodium 135 - 145 mmol/L 137  137  138   Potassium 3.5 - 5.1 mmol/L 4.3  4.1  3.5   Chloride 98 - 111 mmol/L 101  104  100   CO2 22 - 32 mmol/L 28  28  29    Calcium 8.9 - 10.3 mg/dL 8.8  8.3  9.1   Total Protein 6.5 - 8.1 g/dL  6.8    Total Bilirubin 0.3 - 1.2 mg/dL  0.6    Alkaline Phos 38 - 126 U/L  107    AST 15 - 41 U/L  22    ALT 0 - 44 U/L  20      DIAGNOSTIC IMAGING:  I have independently reviewed the relevant imaging and discussed with the patient.  ASSESSMENT & PLAN: 1.   Leukocytosis and thrombocytosis: - Patient seen at the request  of Dr. Phillips Odor for elevated white count and platelet count. - Labs from PCP (12/21/2021): WBC 13.3, Hgb 10.7/MCV 84, platelets 511 - Review of lab trends shows intermittently elevated white blood cells over the past 5+ years - She was admitted to the hospital from 12/23/2021 through 12/24/2021, treated with steroids and antibiotics for possible COPD exacerbation/infection. - She completed prednisone taper on 01/03/2022.  She uses Trelegy inhaler daily. - Denies any fevers or night sweats.  Gained 22 pounds since she quit smoking.  No recurrent infections.  No vasomotor symptoms/aquagenic pruritus/prior thrombosis. - CBC  (01/16/2022), checked 2 weeks after completion of steroid taper, showed normalization of WBC at 7.4 with normal differential. - Additional hematology work-up (01/16/2022): Normal LDH.  Reticulocytes 2.5%. Significant iron deficiency with ferritin 4, iron saturation 3%, and TIBC 487 Normal B12 and folate. - Most recent CBC (03/14/2022): WBC 12.3/ANC 8.5/monocytes 1.5, platelets 457 - Clinical picture favors reactive thrombocytosis from iron deficiency.  Leukocytosis likely reactive in the setting of COPD and frequent steroids, including chronic steroid inhaler (Trelegy) - PLAN: We will check JAK2 with reflex as well as BCR/ABL due to persistent leukocytosis and thrombocytosis.  2.  Iron deficiency anemia - Labs from 01/16/2022 show Hgb 10.0/MCV 87.1, ferritin 4, iron saturation 3%, TIBC 487.  B12, folate, LDH, and reticulocytes were normal.  Recent CMP showed normal kidney function. - Colonoscopy (06/13/2015): Diverticulosis, polyp x1 (tubular adenoma) - 5 year follow-up recommended by Dr. Jena Gauss - She has never had EGD. - She previously took [place] powder twice daily, is now using "occasional" ibuprofen (approximately once a week) - Took iron supplement previously, quit 1 year ago due to constipation and upset stomach.  Restarted daily iron supplement since 01/24/2022  - Most recent labs (03/23/2022): Hgb 10.2/MCV 90.6, ferritin 6, iron saturation 3% with TIBC 472 - Stool cards x 3 positive (02/02/2022).  Referred to GI and scheduled for EGD/colonoscopy on 04/23/2022.   - No bright red blood per rectum or melena. - Reports fatigue with energy about 50% - PLAN: Failed to improve on oral iron supplementation.  Recommend IV Venofer 300 mg x 3. - Repeat labs and RTC in 8 weeks   3.  Smoking history - She smoked 1-2 PPD x55 years, quit in March 2023 - This patient meets criteria for low-dose CT lung cancer screening until age 28 - She receives annual lung cancer screening via NP Kandice Robinsons at [facility]  pulmonology  4.   Other history - PMH: COPD, hypertension, anxiety, arthritis, coronary artery disease, peripheral arterial disease with leg claudication - She lives at home with her husband.  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  Quit smoking on 05/29/2021.  Smoked 1 pack/day for 55 years. - No family history of leukemia.  Maternal uncle had lung cancer and was smoker.   PLAN SUMMARY: >> IV Venofer 300 mg x 3  >> Labs (CBC/D, ferritin, iron/TIBC, LDH, BCR/ABL FISH, JAK2 with reflex) >> OFFICE visit 1 week after labs   All questions were answered. The patient knows to call the clinic with any problems, questions or concerns.  Medical decision making: Moderate  Time spent on visit: I spent 22 minutes counseling the patient face to face. The total time spent in the appointment was 30 minutes and more than 50% was on counseling.   Carnella Guadalajara, PA-C  03/28/22 11:21 AM

## 2022-03-28 ENCOUNTER — Other Ambulatory Visit: Payer: Self-pay

## 2022-03-28 ENCOUNTER — Encounter: Payer: Self-pay | Admitting: Physician Assistant

## 2022-03-28 ENCOUNTER — Inpatient Hospital Stay: Payer: Medicare Other | Attending: Hematology | Admitting: Physician Assistant

## 2022-03-28 VITALS — BP 129/69 | HR 94 | Temp 98.5°F | Resp 16 | Wt 121.7 lb

## 2022-03-28 DIAGNOSIS — D500 Iron deficiency anemia secondary to blood loss (chronic): Secondary | ICD-10-CM

## 2022-03-28 DIAGNOSIS — D72829 Elevated white blood cell count, unspecified: Secondary | ICD-10-CM

## 2022-03-28 DIAGNOSIS — K30 Functional dyspepsia: Secondary | ICD-10-CM | POA: Insufficient documentation

## 2022-03-28 DIAGNOSIS — Z87891 Personal history of nicotine dependence: Secondary | ICD-10-CM | POA: Diagnosis not present

## 2022-03-28 DIAGNOSIS — R079 Chest pain, unspecified: Secondary | ICD-10-CM | POA: Insufficient documentation

## 2022-03-28 DIAGNOSIS — D75839 Thrombocytosis, unspecified: Secondary | ICD-10-CM

## 2022-03-28 DIAGNOSIS — J449 Chronic obstructive pulmonary disease, unspecified: Secondary | ICD-10-CM | POA: Diagnosis not present

## 2022-03-28 DIAGNOSIS — R0609 Other forms of dyspnea: Secondary | ICD-10-CM | POA: Diagnosis not present

## 2022-03-28 DIAGNOSIS — K5900 Constipation, unspecified: Secondary | ICD-10-CM | POA: Diagnosis not present

## 2022-03-28 DIAGNOSIS — Z885 Allergy status to narcotic agent status: Secondary | ICD-10-CM | POA: Insufficient documentation

## 2022-03-28 DIAGNOSIS — D509 Iron deficiency anemia, unspecified: Secondary | ICD-10-CM

## 2022-03-28 DIAGNOSIS — Z79899 Other long term (current) drug therapy: Secondary | ICD-10-CM | POA: Insufficient documentation

## 2022-03-28 DIAGNOSIS — I10 Essential (primary) hypertension: Secondary | ICD-10-CM | POA: Diagnosis not present

## 2022-03-28 DIAGNOSIS — R200 Anesthesia of skin: Secondary | ICD-10-CM | POA: Insufficient documentation

## 2022-03-28 DIAGNOSIS — Z8719 Personal history of other diseases of the digestive system: Secondary | ICD-10-CM | POA: Insufficient documentation

## 2022-03-28 DIAGNOSIS — M791 Myalgia, unspecified site: Secondary | ICD-10-CM | POA: Diagnosis not present

## 2022-03-28 DIAGNOSIS — M549 Dorsalgia, unspecified: Secondary | ICD-10-CM | POA: Insufficient documentation

## 2022-03-28 DIAGNOSIS — I2510 Atherosclerotic heart disease of native coronary artery without angina pectoris: Secondary | ICD-10-CM | POA: Insufficient documentation

## 2022-03-28 DIAGNOSIS — R0602 Shortness of breath: Secondary | ICD-10-CM | POA: Diagnosis not present

## 2022-03-28 HISTORY — DX: Iron deficiency anemia secondary to blood loss (chronic): D50.0

## 2022-03-28 NOTE — Patient Instructions (Signed)
[facility] **   You were seen today by Tarri Abernethy PA-C for your follow-up visit.    ELEVATED WHITE BLOOD CELLS Your white blood cells are still elevated.  This is most likely related to your COPD and Trelegy inhaler, but I will check additional labs at your next visit to make sure you do not have any primary blood disorder that is causing your high white blood cells.  ELEVATED PLATELETS Your platelets are most likely elevated due to your iron deficiency (see below).  However, I will check additional labs at your next visit to make sure you do not have any primary blood disorder that is causing your high platelets.  IRON DEFICIENCY ANEMIA Your iron levels and red blood cells are lower than normal. This may be due to to possible bleeding from your stomach or intestines.  Continue to follow-up with gastroenterology (Dr. Gala Romney) for your EGD/colonoscopy later this month. Continue to avoid [place] powder, ibuprofen, or any other NSAID medications. Your iron levels are still low despite taking iron tablets at home.  We will schedule you for IV iron x 3 doses.  Please see the attached handout for important information regarding this treatment.  LABS: Return in 8 weeks for repeat labs  FOLLOW-UP APPOINTMENT: Office visit in 8 weeks, 1 week after labs  ** Thank you for trusting me with your healthcare!  I strive to provide all of my patients with quality care at each visit.  If you receive a survey for this visit, I would be so grateful to you for taking the time to provide feedback.  Thank you in advance!  ~ Mazen Marcin                   Dr. Derek Jack   &   Tarri Abernethy, PA-C   - - - - - - - - - - - - - - - - - -    Thank you for choosing [facility] to provide your oncology and hematology care.  To afford each patient quality time with our provider, please arrive  at least 15 minutes before your scheduled appointment time.   If you have a lab appointment with the [facility] please come in thru the Main Entrance and check in at the main information desk.  You need to re-schedule your appointment should you arrive 10 or more minutes late.  We strive to give you quality time with our providers, and arriving late affects you and other patients whose appointments are after yours.  Also, if you no show three or more times for appointments you may be dismissed from the clinic at the providers discretion.     Again, thank you for choosing [facility].  Our hope is that these requests will decrease the amount of time that you wait before being seen by our physicians.       _____________________________________________________________  Should you have questions after your visit to [facility], please contact our office at [phone] and follow the prompts.  Our office hours are 8:00 a.m. and 4:30 p.m. Monday - Friday.  Please note that voicemails left after 4:00 p.m. may not be returned until the following business day.  We are closed weekends and major holidays.  You do have access to a nurse 24-7, just call the main number to the clinic [phone] and do not  press any options, hold on the line and a nurse will answer the phone.    For prescription refill requests, have your pharmacy contact our office and allow 72 hours.

## 2022-03-29 DIAGNOSIS — M542 Cervicalgia: Secondary | ICD-10-CM | POA: Diagnosis not present

## 2022-03-29 DIAGNOSIS — M2569 Stiffness of other specified joint, not elsewhere classified: Secondary | ICD-10-CM | POA: Diagnosis not present

## 2022-03-29 DIAGNOSIS — R293 Abnormal posture: Secondary | ICD-10-CM | POA: Diagnosis not present

## 2022-03-29 DIAGNOSIS — M62512 Muscle wasting and atrophy, not elsewhere classified, left shoulder: Secondary | ICD-10-CM | POA: Diagnosis not present

## 2022-03-30 ENCOUNTER — Ambulatory Visit (HOSPITAL_COMMUNITY): Payer: Medicare Other

## 2022-03-30 DIAGNOSIS — M542 Cervicalgia: Secondary | ICD-10-CM | POA: Diagnosis not present

## 2022-03-30 DIAGNOSIS — M2569 Stiffness of other specified joint, not elsewhere classified: Secondary | ICD-10-CM | POA: Diagnosis not present

## 2022-03-30 DIAGNOSIS — M62512 Muscle wasting and atrophy, not elsewhere classified, left shoulder: Secondary | ICD-10-CM | POA: Diagnosis not present

## 2022-03-30 DIAGNOSIS — R293 Abnormal posture: Secondary | ICD-10-CM | POA: Diagnosis not present

## 2022-04-03 DIAGNOSIS — M542 Cervicalgia: Secondary | ICD-10-CM | POA: Diagnosis not present

## 2022-04-03 DIAGNOSIS — R293 Abnormal posture: Secondary | ICD-10-CM | POA: Diagnosis not present

## 2022-04-03 DIAGNOSIS — M2569 Stiffness of other specified joint, not elsewhere classified: Secondary | ICD-10-CM | POA: Diagnosis not present

## 2022-04-03 DIAGNOSIS — M62512 Muscle wasting and atrophy, not elsewhere classified, left shoulder: Secondary | ICD-10-CM | POA: Diagnosis not present

## 2022-04-04 ENCOUNTER — Other Ambulatory Visit (HOSPITAL_COMMUNITY): Payer: Medicare Other

## 2022-04-04 ENCOUNTER — Ambulatory Visit (HOSPITAL_COMMUNITY)
Admission: RE | Admit: 2022-04-04 | Discharge: 2022-04-04 | Disposition: A | Discharge status: Home / Self Care | Payer: Medicare Other | Source: Ambulatory Visit | Attending: Acute Care | Admitting: Acute Care

## 2022-04-04 DIAGNOSIS — R911 Solitary pulmonary nodule: Secondary | ICD-10-CM | POA: Insufficient documentation

## 2022-04-04 DIAGNOSIS — Z87891 Personal history of nicotine dependence: Secondary | ICD-10-CM | POA: Diagnosis not present

## 2022-04-05 ENCOUNTER — Telehealth: Payer: Self-pay

## 2022-04-05 NOTE — Telephone Encounter (Signed)
Pt called with questions about location of procedure next week at hospital. All pre op instructions were given to pt again and all questions answered at this time. Pt has no further questions/concerns.

## 2022-04-06 ENCOUNTER — Inpatient Hospital Stay: Payer: Medicare Other

## 2022-04-06 VITALS — BP 138/58 | HR 80 | Temp 98.0°F | Resp 18

## 2022-04-06 DIAGNOSIS — J449 Chronic obstructive pulmonary disease, unspecified: Secondary | ICD-10-CM | POA: Diagnosis not present

## 2022-04-06 DIAGNOSIS — R079 Chest pain, unspecified: Secondary | ICD-10-CM | POA: Diagnosis not present

## 2022-04-06 DIAGNOSIS — D72829 Elevated white blood cell count, unspecified: Secondary | ICD-10-CM | POA: Diagnosis not present

## 2022-04-06 DIAGNOSIS — M549 Dorsalgia, unspecified: Secondary | ICD-10-CM | POA: Diagnosis not present

## 2022-04-06 DIAGNOSIS — D75839 Thrombocytosis, unspecified: Secondary | ICD-10-CM | POA: Diagnosis not present

## 2022-04-06 DIAGNOSIS — D500 Iron deficiency anemia secondary to blood loss (chronic): Secondary | ICD-10-CM

## 2022-04-06 DIAGNOSIS — D509 Iron deficiency anemia, unspecified: Secondary | ICD-10-CM | POA: Diagnosis not present

## 2022-04-06 MED ORDER — SODIUM CHLORIDE 0.9 % IV SOLN: Freq: Once | INTRAVENOUS | Status: AC

## 2022-04-06 MED ORDER — ACETAMINOPHEN 325 MG PO TABS
650.0000 mg | ORAL_TABLET | Freq: Once | ORAL | Status: AC
  Administered 2022-04-06: 650 mg via ORAL
  Filled 2022-04-06: qty 2

## 2022-04-06 MED ORDER — LORATADINE 10 MG PO TABS
10.0000 mg | ORAL_TABLET | Freq: Once | ORAL | Status: AC
  Administered 2022-04-06: 10 mg via ORAL
  Filled 2022-04-06: qty 1

## 2022-04-06 MED ORDER — SODIUM CHLORIDE 0.9 % IV SOLN
300.0000 mg | Freq: Once | INTRAVENOUS | Status: AC
  Administered 2022-04-06: 300 mg via INTRAVENOUS
  Filled 2022-04-06: qty 300

## 2022-04-06 NOTE — Patient Instructions (Signed)
[facility]  Discharge Instructions: Thank you for choosing [facility] to provide your oncology and hematology care.  If you have a lab appointment with the Cancer Center, please come in thru the Main Entrance and check in at the main information desk.  Wear comfortable clothing and clothing appropriate for easy access to any Portacath or PICC line.   We strive to give you quality time with your provider. You may need to reschedule your appointment if you arrive late (15 or more minutes).  Arriving late affects you and other patients whose appointments are after yours.  Also, if you miss three or more appointments without notifying the office, you may be dismissed from the clinic at the provider's discretion.      For prescription refill requests, have your pharmacy contact our office and allow 72 hours for refills to be completed.    Today you received the following Venofer, return as scheduled.   To help prevent nausea and vomiting after your treatment, we encourage you to take your nausea medication as directed.  BELOW ARE SYMPTOMS THAT SHOULD BE REPORTED IMMEDIATELY: *FEVER GREATER THAN 100.4 F (38 C) OR HIGHER *CHILLS OR SWEATING *NAUSEA AND VOMITING THAT IS NOT CONTROLLED WITH YOUR NAUSEA MEDICATION *UNUSUAL SHORTNESS OF BREATH *UNUSUAL BRUISING OR BLEEDING *URINARY PROBLEMS (pain or burning when urinating, or frequent urination) *BOWEL PROBLEMS (unusual diarrhea, constipation, pain near the anus) TENDERNESS IN MOUTH AND THROAT WITH OR WITHOUT PRESENCE OF ULCERS (sore throat, sores in mouth, or a toothache) UNUSUAL RASH, SWELLING OR PAIN  UNUSUAL VAGINAL DISCHARGE OR ITCHING   Items with * indicate a potential emergency and should be followed up as soon as possible or go to the Emergency Department if any problems should occur.  Please show the CHEMOTHERAPY ALERT CARD or IMMUNOTHERAPY ALERT CARD at check-in to the Emergency Department and triage  nurse.  Should you have questions after your visit or need to cancel or reschedule your appointment, please contact [facility] [phone]  and follow the prompts.  Office hours are 8:00 a.m. to 4:30 p.m. Monday - Friday. Please note that voicemails left after 4:00 p.m. may not be returned until the following business day.  We are closed weekends and major holidays. You have access to a nurse at all times for urgent questions. Please call the main number to the clinic [phone] and follow the prompts.  For any non-urgent questions, you may also contact your provider using MyChart. We now offer e-Visits for anyone 18 and older to request care online for non-urgent symptoms. For details visit [email or web address].   Also download the MyChart app! Go to the app store, search "MyChart", open the app, select [facility], and log in with your MyChart username and password.   

## 2022-04-06 NOTE — Progress Notes (Signed)
Patient tolerated iron infusion with no complaints voiced.  Peripheral IV site clean and dry with good blood return noted before and after infusion.  Band aid applied.  VSS with discharge and left in satisfactory condition with no s/s of distress noted.   

## 2022-04-10 ENCOUNTER — Telehealth: Payer: Self-pay | Admitting: Acute Care

## 2022-04-10 ENCOUNTER — Encounter: Payer: Self-pay | Admitting: Cardiology

## 2022-04-10 ENCOUNTER — Encounter: Payer: Self-pay | Admitting: *Deleted

## 2022-04-10 ENCOUNTER — Other Ambulatory Visit: Payer: Self-pay

## 2022-04-10 ENCOUNTER — Ambulatory Visit (HOSPITAL_COMMUNITY)
Admission: RE | Admit: 2022-04-10 | Discharge: 2022-04-10 | Disposition: A | Discharge status: Home / Self Care | Payer: Medicare Other | Attending: Surgery | Admitting: Surgery

## 2022-04-10 ENCOUNTER — Encounter (HOSPITAL_COMMUNITY): Payer: Self-pay | Admitting: Surgery

## 2022-04-10 ENCOUNTER — Ambulatory Visit (HOSPITAL_COMMUNITY)
Admission: RE | Disposition: A | Discharge status: Home / Self Care | Payer: Self-pay | Source: Home / Self Care | Attending: Surgery

## 2022-04-10 DIAGNOSIS — I708 Atherosclerosis of other arteries: Secondary | ICD-10-CM | POA: Insufficient documentation

## 2022-04-10 DIAGNOSIS — Z87891 Personal history of nicotine dependence: Secondary | ICD-10-CM | POA: Insufficient documentation

## 2022-04-10 DIAGNOSIS — I700 Atherosclerosis of aorta: Secondary | ICD-10-CM

## 2022-04-10 DIAGNOSIS — J984 Other disorders of lung: Secondary | ICD-10-CM | POA: Insufficient documentation

## 2022-04-10 DIAGNOSIS — I70213 Atherosclerosis of native arteries of extremities with intermittent claudication, bilateral legs: Secondary | ICD-10-CM | POA: Diagnosis not present

## 2022-04-10 DIAGNOSIS — I739 Peripheral vascular disease, unspecified: Secondary | ICD-10-CM

## 2022-04-10 DIAGNOSIS — R0609 Other forms of dyspnea: Secondary | ICD-10-CM

## 2022-04-10 DIAGNOSIS — I2510 Atherosclerotic heart disease of native coronary artery without angina pectoris: Secondary | ICD-10-CM

## 2022-04-10 HISTORY — PX: ABDOMINAL AORTOGRAM W/LOWER EXTREMITY: CATH118223

## 2022-04-10 LAB — POCT ACTIVATED CLOTTING TIME
Activated Clotting Time: 201 seconds
Activated Clotting Time: 223 seconds
Activated Clotting Time: 174 seconds

## 2022-04-10 LAB — POCT I-STAT, CHEM 8
BUN: 9 mg/dL (ref 8–23)
Calcium, Ion: 1.26 mmol/L (ref 1.15–1.40)
Chloride: 101 mmol/L (ref 98–111)
Creatinine, Ser: 0.6 mg/dL (ref 0.44–1.00)
Glucose, Bld: 93 mg/dL (ref 70–99)
HCT: 36 % (ref 36.0–46.0)
Hemoglobin: 12.2 g/dL (ref 12.0–15.0)
Potassium: 4.3 mmol/L (ref 3.5–5.1)
Sodium: 142 mmol/L (ref 135–145)
TCO2: 28 mmol/L (ref 22–32)

## 2022-04-10 SURGERY — ABDOMINAL AORTOGRAM W/LOWER EXTREMITY
Anesthesia: LOCAL

## 2022-04-10 MED ORDER — CLOPIDOGREL BISULFATE 300 MG PO TABS
ORAL_TABLET | ORAL | Status: AC
  Filled 2022-04-10: qty 1

## 2022-04-10 MED ORDER — SODIUM CHLORIDE 0.9 % IV SOLN: 250.0000 mL | INTRAVENOUS | Status: DC | PRN

## 2022-04-10 MED ORDER — ASPIRIN 81 MG PO CHEW
CHEWABLE_TABLET | ORAL | Status: AC
  Filled 2022-04-10: qty 1

## 2022-04-10 MED ORDER — CLOPIDOGREL BISULFATE 75 MG PO TABS: 75.0000 mg | ORAL_TABLET | Freq: Every day | ORAL | 11 refills | Status: DC

## 2022-04-10 MED ORDER — ACETAMINOPHEN 325 MG PO TABS: 650.0000 mg | ORAL_TABLET | ORAL | Status: DC | PRN

## 2022-04-10 MED ORDER — FENTANYL CITRATE (PF) 100 MCG/2ML IJ SOLN
INTRAMUSCULAR | Status: AC
  Filled 2022-04-10: qty 2

## 2022-04-10 MED ORDER — LIDOCAINE HCL (PF) 1 % IJ SOLN
INTRAMUSCULAR | Status: AC
  Filled 2022-04-10: qty 30

## 2022-04-10 MED ORDER — MIDAZOLAM HCL 2 MG/2ML IJ SOLN
INTRAMUSCULAR | Status: AC
  Filled 2022-04-10: qty 2

## 2022-04-10 MED ORDER — SODIUM CHLORIDE 0.9 % IV SOLN: INTRAVENOUS | Status: DC

## 2022-04-10 MED ORDER — PANTOPRAZOLE SODIUM 40 MG PO TBEC: 40.0000 mg | DELAYED_RELEASE_TABLET | Freq: Every day | ORAL | 11 refills | Status: DC

## 2022-04-10 MED ORDER — SODIUM CHLORIDE 0.9 % WEIGHT BASED INFUSION
1.0000 mL/kg/h | INTRAVENOUS | Status: DC
  Administered 2022-04-10: 1 mL/kg/h via INTRAVENOUS

## 2022-04-10 MED ORDER — LABETALOL HCL 5 MG/ML IV SOLN: 10.0000 mg | INTRAVENOUS | Status: DC | PRN

## 2022-04-10 MED ORDER — HEPARIN (PORCINE) IN NACL 1000-0.9 UT/500ML-% IV SOLN
INTRAVENOUS | Status: AC
  Filled 2022-04-10: qty 1000

## 2022-04-10 MED ORDER — CLOPIDOGREL BISULFATE 75 MG PO TABS: 75.0000 mg | ORAL_TABLET | Freq: Every day | ORAL | Status: DC

## 2022-04-10 MED ORDER — FENTANYL CITRATE (PF) 100 MCG/2ML IJ SOLN
INTRAMUSCULAR | Status: DC | PRN
  Administered 2022-04-10: 25 ug via INTRAVENOUS
  Administered 2022-04-10: 50 ug via INTRAVENOUS

## 2022-04-10 MED ORDER — SODIUM CHLORIDE 0.9% FLUSH: 3.0000 mL | INTRAVENOUS | Status: DC | PRN

## 2022-04-10 MED ORDER — HEPARIN SODIUM (PORCINE) 1000 UNIT/ML IJ SOLN
INTRAMUSCULAR | Status: AC
  Filled 2022-04-10: qty 10

## 2022-04-10 MED ORDER — IODIXANOL 320 MG/ML IV SOLN
INTRAVENOUS | Status: DC | PRN
  Administered 2022-04-10: 155 mL via INTRA_ARTERIAL

## 2022-04-10 MED ORDER — LIDOCAINE HCL (PF) 1 % IJ SOLN
INTRAMUSCULAR | Status: DC | PRN
  Administered 2022-04-10: 10 mL via INTRADERMAL
  Administered 2022-04-10: 15 mL via INTRADERMAL

## 2022-04-10 MED ORDER — SODIUM CHLORIDE 0.9% FLUSH: 3.0000 mL | Freq: Two times a day (BID) | INTRAVENOUS | Status: DC

## 2022-04-10 MED ORDER — CLOPIDOGREL BISULFATE 300 MG PO TABS
ORAL_TABLET | ORAL | Status: DC | PRN
  Administered 2022-04-10: 300 mg via ORAL

## 2022-04-10 MED ORDER — HYDRALAZINE HCL 20 MG/ML IJ SOLN: 5.0000 mg | INTRAMUSCULAR | Status: DC | PRN

## 2022-04-10 MED ORDER — ASPIRIN 81 MG PO TBEC: 81.0000 mg | DELAYED_RELEASE_TABLET | Freq: Every day | ORAL | 2 refills | Status: DC

## 2022-04-10 MED ORDER — HEPARIN SODIUM (PORCINE) 1000 UNIT/ML IJ SOLN
INTRAMUSCULAR | Status: DC | PRN
  Administered 2022-04-10: 6000 [IU] via INTRAVENOUS

## 2022-04-10 MED ORDER — MIDAZOLAM HCL 2 MG/2ML IJ SOLN
INTRAMUSCULAR | Status: DC | PRN
  Administered 2022-04-10: 2 mg via INTRAVENOUS
  Administered 2022-04-10: 1 mg via INTRAVENOUS

## 2022-04-10 MED ORDER — CLOPIDOGREL BISULFATE 75 MG PO TABS: 300.0000 mg | ORAL_TABLET | Freq: Once | ORAL | Status: DC

## 2022-04-10 MED ORDER — ASPIRIN 81 MG PO TBEC
81.0000 mg | DELAYED_RELEASE_TABLET | Freq: Every day | ORAL | Status: DC
  Administered 2022-04-10: 81 mg via ORAL

## 2022-04-10 MED ORDER — MORPHINE SULFATE (PF) 4 MG/ML IV SOLN: 4.0000 mg | INTRAVENOUS | Status: DC | PRN

## 2022-04-10 MED ORDER — ONDANSETRON HCL 4 MG/2ML IJ SOLN: 4.0000 mg | Freq: Four times a day (QID) | INTRAMUSCULAR | Status: DC | PRN

## 2022-04-10 SURGICAL SUPPLY — 20 items
BALLN MUSTANG 6X60X135 (BALLOONS) ×1
BALLOON MUSTANG 6X60X135 (BALLOONS) IMPLANT
CATH OMNI FLUSH 5F 65CM (CATHETERS) IMPLANT
GLIDEWIRE NITREX 0.018X80X5 (WIRE) ×1
GUIDEWIRE NITREX 0.018X80X5 (WIRE) IMPLANT
KIT ENCORE 26 ADVANTAGE (KITS) IMPLANT
KIT MICROPUNCTURE NIT STIFF (SHEATH) IMPLANT
KIT PV (KITS) ×1 IMPLANT
SHEATH BRITE TIP 7FR 35CM (SHEATH) IMPLANT
SHEATH PINNACLE 5F 10CM (SHEATH) IMPLANT
STENT ELUVIA 7X60X130 (Permanent Stent) IMPLANT
STENT VIABAHN 7X39X80 VBX (Permanent Stent) IMPLANT
STENT VIABAHN VBX 7X59X80 (Permanent Stent) IMPLANT
SYR MEDRAD MARK V 150ML (SYRINGE) IMPLANT
TAPE VIPERTRACK RADIOPAQ (MISCELLANEOUS) IMPLANT
TAPE VIPERTRACK RADIOPAQUE (MISCELLANEOUS) ×1
TRANSDUCER W/STOPCOCK (MISCELLANEOUS) ×1 IMPLANT
TRAY PV CATH (CUSTOM PROCEDURE TRAY) ×1 IMPLANT
WIRE BENTSON .035X145CM (WIRE) IMPLANT
WIRE HI TORQ VERSACORE 300 (WIRE) IMPLANT

## 2022-04-10 NOTE — Op Note (Signed)
Patient name: Desiree Barber MRN: [id number] DOB: [birth year] Sex: female  04/10/2022 Pre-operative Diagnosis: Bilateral claudication right greater than left Post-operative diagnosis:  Same Surgeon:  Annamarie Major Procedure Performed:  1.  Ultrasound-guided access, left femoral artery  2.  Ultrasound-guided access, right femoral artery  3.  Abdominal aortogram  4.  Bilateral lower extremity runoff  5.  Stent, left common iliac artery  6.  Stent, right common iliac artery  7.  Stent, right external iliac artery  8.  Conscious sedation, 81 minutes   Indications: This is a 72 year old female with severe lifestyle limiting claudication particular in the right leg.  She comes in today for angiographic evaluation.  Procedure:  The patient was identified in the holding area and taken to room 8.  The patient was then placed supine on the table and prepped and draped in the usual sterile fashion.  A time out was called.  Conscious sedation was administered with the use of IV fentanyl and Versed under continuous physician and nurse monitoring.  Heart rate, blood pressure, and oxygen saturation were continuously monitored.  Total sedation time was 81 minutes.  Ultrasound was used to evaluate the left common femoral artery.  It was patent .  A digital ultrasound image was acquired.  A micropuncture needle was used to access the left common femoral artery under ultrasound guidance.  An 018 wire was advanced without resistance and a micropuncture sheath was placed.  The 018 wire was removed and a benson wire was placed.  The micropuncture sheath was exchanged for a 5 french sheath.  An omniflush catheter was advanced over the wire to the level of L-1.  An abdominal angiogram was obtained.  The catheter was pulled down to the aortic bifurcation and pelvic imaging followed by bilateral runoff was obtained. Findings:   Aortogram: No significant renal artery stenosis was identified.  The infrarenal abdominal  aorta is heavily calcified.  Just below the renal arteries, there is area of luminal narrowing that is not hemodynamically significant.  Less than 50% stenosis at the origin of the left common iliac artery.  Greater than 80% right common iliac stenosis.  There is also a greater than 70% right extrailiac lesion.  The left external iliac artery is patent throughout its course without significant stenosis although it is heavily calcified.  Right Lower Extremity: The right common femoral and profundofemoral artery are calcified but patent without stenosis.  The superficial femoral artery shows mild diffuse disease, heavy calcification, but no significant stenoses.  Tibial vessels are small and not well-evaluated secondary to contrast timing.  Left Lower Extremity: The left common femoral and profundofemoral artery heavily calcified but without significant stenosis.  The superficial femoral and popliteal arteries show diffuse disease without hemodynamically significant stenosis.  There is three-vessel runoff with small tibials  Intervention: After the above images were acquired the decision was made to proceed with intervention.  Under ultrasound guidance a 7 French sheath was placed into the right common femoral artery up into the aorta.  The sheath in the left side was exchanged out for a 7 [place] sheath.  The patient was fully heparinized.  I felt that I had to extend the aortic bifurcation because of the location of the disease.  I placed a 7 x 59 VBX and a 7 x 39 VBX in kissing fashion and bilateral common iliac arteries.  The 7 x 59 was on the right side.  I then measured pressures in the external iliac artery  and there was a 30 mm gradient across the lesion.  Therefore the external iliac artery on the right was treated with a 7 x 60 Elluvia stent that was postdilated with a 6 mm balloon.  Completion imaging showed no residual stenosis.  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  A 7 x 59 VBX was placed on the right and a 7 x 39 on the left.  #2  Greater than 70% stenosis in the right external iliac artery with a 30 mm pressure gradient treated using a 7 x 60 Elluvia stent   V. Annamarie Major, M.D., [facility] [place] Office: [phone] Pager:  [phone]

## 2022-04-10 NOTE — Telephone Encounter (Signed)
Please see the MyChart message reply(ies) for my assessment and plan.    This patient gave consent for this Medical Advice Message and is aware that it may result in a bill to their insurance company, as well as the possibility of receiving a bill for a co-payment or deductible. They are an established patient, but are not seeking medical advice exclusively about a problem treated during an in person or video visit in the last seven days. I did not recommend an in person or video visit within seven days of my reply.    I spent a total of 7 minutes cumulative time within 7 days through MyChart messaging.  Hser Belanger E Carolena Fairbank, MD   

## 2022-04-10 NOTE — Telephone Encounter (Signed)
I have attempted to call the patient with the results of their  Low Dose CT Chest Lung cancer screening scan. There was no answer. I have left a HIPPA compliant VM requesting the patient call the office for the scan results. I included the office contact information in the message. We will await his return call. If no return call we will continue to call until patient is contacted.   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  She will probably benefit from mucolytic and flutter valve.  She is followed by Dr. Melvyn Novas, last seen by him 02/14/2022.  37-monthfollow-up low-dose CT, please have her follow-up with Dr. WMelvyn Novassometime soon for the findings of bronchiectasis and mucous plugging.  Please fax results to PCP and let them know plan for care. There was notation of three-vessel coronary atherosclerosis, aortic atherosclerosis and emphysema. [facility], so I suspect she has cardiology follow-up. She is currently on statin therapy.

## 2022-04-10 NOTE — Progress Notes (Signed)
SITE AREA: left groin/femoral  SITE PRIOR TO REMOVAL:  LEVEL 0  PRESSURE APPLIED FOR: approximately 20 minutes  MANUAL: yes  PATIENT STATUS DURING PULL: stable  POST PULL SITE:  LEVEL 0  POST PULL INSTRUCTIONS GIVEN: yes  POST PULL PULSES PRESENT: bilateral pedal pulses at +2  DRESSING APPLIED: gauze with tegaderm  BEDREST BEGINS @ 1125   COMMENTS:

## 2022-04-10 NOTE — Progress Notes (Signed)
SITE AREA: right groin/femoral  SITE PRIOR TO REMOVAL:  LEVEL 0  PRESSURE APPLIED FOR: approximately 20 minutes  MANUAL: yes  PATIENT STATUS DURING PULL: stable  POST PULL SITE:  LEVEL 0  POST PULL INSTRUCTIONS GIVEN: yes  POST PULL PULSES PRESENT: bilateral pedal pulses palpable at +2  DRESSING APPLIED: gauze with tegaderm  BEDREST BEGINS '@see'$  next note after 2nd femoral sheath removed  COMMENTS: Modesta Messing, RCIS removed right femoral sheath, this RN took over the hold at 1033, report received

## 2022-04-10 NOTE — Telephone Encounter (Signed)
Orders below from Dr. Johney Frame via secure chat on this pt:  Can we sent in pantoprazole '40mg'$  daily for her and set her up for a PET  Order for Cardiac PET Scan placed on this pt and staff message will be sent to [facility] Cardiac PET Scheduling team to call her back to arrange this appt.   Sent in pantoprazole 40 mg po daily to the pts pharmacy on file.   Will pend attestation order for PET scan in this encounter for Dr. Johney Frame to sign off on.    Will send PET instructions to the pt via mychart message.   Dr. Johney Frame made the pt and friend aware of all of this plan via mychart message.    How to Prepare for Your Cardiac PET/CT Stress Test:  1. Please do not take these medications before your test:   Medications that may interfere with the cardiac pharmacological stress agent (ex. nitrates - including erectile dysfunction medications, isosorbide mononitrate or beta-blockers) the day of the exam.  Your remaining medications may be taken with water.  2. Nothing to eat or drink, except water, 3 hours prior to arrival time.   NO caffeine/decaffeinated products, or chocolate 12 hours prior to arrival.  3. NO perfume, cologne or lotion  4. Total time is 1 to 2 hours; you may want to bring reading material for the waiting time.  5. Please report to Admitting at the [facility] Entrance 30 minutes early for your test.  [place], [place] [address]    In preparation for your appointment, medication and supplies will be purchased.  Appointment availability is limited, so if you need to cancel or reschedule, please call the Radiology Department at [phone]  24 hours in advance to avoid a cancellation fee of $100.00  What to Expect After you Arrive:  Once you arrive and check in for your appointment, you will be taken to a preparation room within the Radiology Department.  A technologist or Nurse will obtain your medical history, verify that you are  correctly prepped for the exam, and explain the procedure.  Afterwards,  an IV will be started in your arm and electrodes will be placed on your skin for EKG monitoring during the stress portion of the exam. Then you will be escorted to the PET/CT scanner.  There, staff will get you positioned on the scanner and obtain a blood pressure and EKG.  During the exam, you will continue to be connected to the EKG and blood pressure machines.  A small, safe amount of a radioactive tracer will be injected in your IV to obtain a series of pictures of your heart along with an injection of a stress agent.    After your Exam:  It is recommended that you eat a meal and drink a caffeinated beverage to counter act any effects of the stress agent.  Drink plenty of fluids for the remainder of the day and urinate frequently for the first couple of hours after the exam.  Your doctor will inform you of your test results within 7-10 business days.  For questions about your test or how to prepare for your test, please call: Marchia Bond, Cardiac Imaging Nurse Navigator  Gordy Clement, Cardiac Imaging Nurse Navigator Office: [phone]

## 2022-04-10 NOTE — H&P (Signed)
[facility] of [place]   Patient name: Desiree Barber          MRN: [id number]        DOB: 1951/03/08          Sex: female   REASON FOR CONSULT: Evaluation limiting claudication right leg   HPI: CORDELL Barber is a 72 y.o. female, who is here today for evaluation.  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  This has been present for a time but has become progressive and intolerable.  She can have occasional mild symptoms in her left leg but this is much more severe in the right.  Ports that it involves her whole leg and it is relieved with rest.  She has no rest pain.  She has no tissue loss.  She did quit smoking cigarettes approximately 9 months ago after smoking for 36 years       Past Medical History:  Diagnosis Date   Anemia     Anxiety      Occasional   Arthritis     COPD (chronic obstructive pulmonary disease) (HCC)     HTN (hypertension)             Family History  Problem Relation Age of Onset   Colon cancer Neg Hx     Breast cancer Neg Hx        SOCIAL HISTORY: Social History         Socioeconomic History   Marital status: Married      Spouse name: Not on file   Number of children: Not on file   Years of education: Not on file   Highest education level: Not on file  Occupational History   Not on file  Tobacco Use   Smoking status: Former      Packs/day: 0.50      Years: 55.00      Total pack years: 27.50      Types: Cigarettes      Quit date: 05/29/2021      Years since quitting: 0.7      Passive exposure: Past   Smokeless tobacco: Former   Tobacco comments:      Quit date 05/29/2021  Vaping Use   Vaping Use: Never used  Substance and Sexual Activity   Alcohol use: Yes      Alcohol/week: 0.0 standard drinks of alcohol      Comment: 3 beers a day   Drug use: No   Sexual activity: Yes      Birth control/protection: Surgical  Other Topics Concern   Not on file   Social History Narrative   Not on file    Social Determinants of Health        Financial Resource Strain: Not on file  Food Insecurity: Unknown (12/24/2021)    Hunger Vital Sign     Worried About Running Out of Food in the Last Year: Never true     Ran Out of Food in the Last Year: Not on file  Transportation Needs: No Transportation Needs (12/24/2021)    PRAPARE - Armed forces logistics/support/administrative officer (Medical): No     Lack of Transportation (Non-Medical): No  Physical Activity: Not on file  Stress: Not on file  Social Connections: Not on file  Intimate Partner Violence: Not At Risk (12/24/2021)    Humiliation, Afraid, Rape, and Kick questionnaire  Fear of Current or Ex-Partner: No     Emotionally Abused: No     Physically Abused: No     Sexually Abused: No           Allergies  Allergen Reactions   Demerol [Meperidine] Other (See Comments)      Unknown   Oxycontin [Oxycodone Hcl] Other (See Comments)      Unknown   Fish Allergy Nausea Only      Only fish from [place]   Lavender Oil     Other        Flower oils             Current Outpatient Medications  Medication Sig Dispense Refill   albuterol (VENTOLIN HFA) 108 (90 Base) MCG/ACT inhaler Inhale into the lungs.       ALPRAZolam (XANAX) 0.25 MG tablet Take 0.25 mg by mouth daily as needed for anxiety or sleep.       Ascorbic Acid (VITAMIN C) 100 MG tablet Take 100 mg by mouth daily.       ferrous sulfate 325 (65 FE) MG EC tablet Take 325 mg by mouth daily.       losartan (COZAAR) 25 MG tablet Take 1 tablet (25 mg total) by mouth daily. 90 tablet 2   rosuvastatin (CRESTOR) 20 MG tablet Take 1 tablet (20 mg total) by mouth daily. 90 tablet 2   umeclidinium-vilanterol (ANORO ELLIPTA) 62.5-25 MCG/ACT AEPB Inhale 1 puff into the lungs daily. 1 each 11   vitamin B-12 (CYANOCOBALAMIN) 100 MCG tablet Take 100 mcg by mouth daily.       VITAMIN D, CHOLECALCIFEROL, PO Take by mouth. One daily        No current  facility-administered medications for this visit.      REVIEW OF SYSTEMS:  '[X]'$  denotes positive finding, '[ ]'$  denotes negative finding Cardiac   Comments:  Chest pain or chest pressure:      Shortness of breath upon exertion: x    Short of breath when lying flat:      Irregular heart rhythm:             Vascular      Pain in calf, thigh, or hip brought on by ambulation: x    Pain in feet at night that wakes you up from your sleep:       Blood clot in your veins:      Leg swelling:              Pulmonary      Oxygen at home: x    Productive cough:       Wheezing:              Neurologic      Sudden weakness in arms or legs:       Sudden numbness in arms or legs:       Sudden onset of difficulty speaking or slurred speech:      Temporary loss of vision in one eye:       Problems with dizziness:              Gastrointestinal      Blood in stool:       Vomited blood:              Genitourinary      Burning when urinating:       Blood in urine:  Psychiatric      Major depression:              Hematologic      Bleeding problems:      Problems with blood clotting too easily:             Skin      Rashes or ulcers:             Constitutional      Fever or chills:          PHYSICAL EXAM:    Vitals:    03/07/22 1032  BP: 117/72  Pulse: 87  Temp: 98.1 F (36.7 C)  SpO2: 95%  Weight: 121 lb 9.6 oz (55.2 kg)  Height: '5\' 5"'$  (1.651 m)      GENERAL: The patient is a well-nourished female, in no acute distress. The vital signs are documented above. CARDIOVASCULAR: 2+ radial pulses bilaterally.  1-2+ femoral pulses bilaterally.  1+ dorsalis pedis pulses bilaterally. PULMONARY: There is good air exchange  MUSCULOSKELETAL: There are no major deformities or cyanosis. NEUROLOGIC: No focal weakness or paresthesias are detected. SKIN: There are no ulcers or rashes noted. PSYCHIATRIC: The patient has a normal affect.   DATA:  Noninvasive studies at [facility] on 02/28/2022 suggested ankle arm index of 0.77 on the right and 0.91 on the left   MEDICAL ISSUES: Bilateral lower extremity claudication much worse in the right leg than on the left.  She reports this is a very difficult for her to do her activities such as walking which she enjoys.  She also has significant pulmonary disease and is been instructed for a walking program for this as well.  Invasive duplex of her arterial tree suggest proximal disease more significant on the right than on the left.  Have suggested arteriogram with runoff and possible intervention.  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  She understands this would be done as an outpatient at [facility] with one of my partners.  We will coordinate this at her convenience     Rosetta Posner, MD [facility] of [facility] Tel [phone] Pager [phone]

## 2022-04-11 ENCOUNTER — Encounter (HOSPITAL_COMMUNITY): Payer: Self-pay | Admitting: Surgery

## 2022-04-11 MED FILL — Heparin Sod (Porcine)-NaCl IV Soln 1000 Unit/500ML-0.9%: INTRAVENOUS | Qty: 1000 | Status: AC

## 2022-04-12 ENCOUNTER — Encounter: Payer: Self-pay | Admitting: Physician Assistant

## 2022-04-12 ENCOUNTER — Encounter: Payer: Self-pay | Admitting: Vascular Surgery

## 2022-04-12 ENCOUNTER — Telehealth: Payer: Self-pay | Admitting: *Deleted

## 2022-04-12 NOTE — Telephone Encounter (Signed)
Pt says she had a vascular procedure done on 04/10/22 and she was started on Plavix. She was told to contact our office because she has a colonoscopy on 04/23/22. Please advise. Thank you

## 2022-04-12 NOTE — Progress Notes (Signed)
Patient called to see is she can still have iron infusion tomorrow since she is now on Plavix since her last visit. Spoke with Tarri Abernethy, PA-C concerning this and she says that patient can still have iron infusion tomorrow. Patient aware and agreeable with plan.

## 2022-04-13 ENCOUNTER — Inpatient Hospital Stay: Payer: Medicare Other

## 2022-04-13 VITALS — BP 122/63 | HR 72 | Temp 97.6°F | Resp 18

## 2022-04-13 DIAGNOSIS — D72829 Elevated white blood cell count, unspecified: Secondary | ICD-10-CM | POA: Diagnosis not present

## 2022-04-13 DIAGNOSIS — D75839 Thrombocytosis, unspecified: Secondary | ICD-10-CM | POA: Diagnosis not present

## 2022-04-13 DIAGNOSIS — D509 Iron deficiency anemia, unspecified: Secondary | ICD-10-CM | POA: Diagnosis not present

## 2022-04-13 DIAGNOSIS — D500 Iron deficiency anemia secondary to blood loss (chronic): Secondary | ICD-10-CM

## 2022-04-13 DIAGNOSIS — M549 Dorsalgia, unspecified: Secondary | ICD-10-CM | POA: Diagnosis not present

## 2022-04-13 DIAGNOSIS — R079 Chest pain, unspecified: Secondary | ICD-10-CM | POA: Diagnosis not present

## 2022-04-13 DIAGNOSIS — J449 Chronic obstructive pulmonary disease, unspecified: Secondary | ICD-10-CM | POA: Diagnosis not present

## 2022-04-13 MED ORDER — SODIUM CHLORIDE 0.9 % IV SOLN
300.0000 mg | Freq: Once | INTRAVENOUS | Status: AC
  Administered 2022-04-13: 300 mg via INTRAVENOUS
  Filled 2022-04-13: qty 300

## 2022-04-13 MED ORDER — SODIUM CHLORIDE 0.9 % IV SOLN: Freq: Once | INTRAVENOUS | Status: AC

## 2022-04-13 NOTE — Patient Instructions (Signed)
[place]  Discharge Instructions: Thank you for choosing [facility] to provide your oncology and hematology care.  If you have a lab appointment with the [facility], please come in thru the Main Entrance and check in at the main information desk.  Wear comfortable clothing and clothing appropriate for easy access to any Portacath or PICC line.   We strive to give you quality time with your provider. You may need to reschedule your appointment if you arrive late (15 or more minutes).  Arriving late affects you and other patients whose appointments are after yours.  Also, if you miss three or more appointments without notifying the office, you may be dismissed from the clinic at the provider's discretion.      For prescription refill requests, have your pharmacy contact our office and allow 72 hours for refills to be completed.    Today you received the following chemotherapy and/or immunotherapy agents venofer 300 mg      To help prevent nausea and vomiting after your treatment, we encourage you to take your nausea medication as directed.  BELOW ARE SYMPTOMS THAT SHOULD BE REPORTED IMMEDIATELY: *FEVER GREATER THAN 100.4 F (38 C) OR HIGHER *CHILLS OR SWEATING *NAUSEA AND VOMITING THAT IS NOT CONTROLLED WITH YOUR NAUSEA MEDICATION *UNUSUAL SHORTNESS OF BREATH *UNUSUAL BRUISING OR BLEEDING *URINARY PROBLEMS (pain or burning when urinating, or frequent urination) *BOWEL PROBLEMS (unusual diarrhea, constipation, pain near the anus) TENDERNESS IN MOUTH AND THROAT WITH OR WITHOUT PRESENCE OF ULCERS (sore throat, sores in mouth, or a toothache) UNUSUAL RASH, SWELLING OR PAIN  UNUSUAL VAGINAL DISCHARGE OR ITCHING   Items with * indicate a potential emergency and should be followed up as soon as possible or go to the Emergency Department if any problems should occur.  Please show the CHEMOTHERAPY ALERT CARD or IMMUNOTHERAPY ALERT CARD at check-in to the  Emergency Department and triage nurse.  Should you have questions after your visit or need to cancel or reschedule your appointment, please contact [facility] [phone]  and follow the prompts.  Office hours are 8:00 a.m. to 4:30 p.m. Monday - Friday. Please note that voicemails left after 4:00 p.m. may not be returned until the following business day.  We are closed weekends and major holidays. You have access to a nurse at all times for urgent questions. Please call the main number to the clinic [phone] and follow the prompts.  For any non-urgent questions, you may also contact your provider using MyChart. We now offer e-Visits for anyone 51 and older to request care online for non-urgent symptoms. For details visit [email or web address].   Also download the MyChart app! Go to the app store, search "MyChart", open the app, select [facility], and log in with your MyChart username and password.

## 2022-04-13 NOTE — Telephone Encounter (Signed)
LM with pt's spouse to have her to return call

## 2022-04-13 NOTE — Progress Notes (Signed)
Patient presents today for Venofer 300 mg iron infusion. Patient denies any side effects related to infusion. Patient took pre-medications at 07:30 am Claritin 10 mg and Tylenol 650 mg.    Venofer given today per MD orders. Tolerated infusion without adverse affects. Vital signs stable. No complaints at this time. Discharged from clinic ambulatory in stable condition. Alert and oriented x 3. F/U with [facility] as scheduled.

## 2022-04-16 NOTE — Telephone Encounter (Signed)
Pt informed of Dr.Carver's recommendations. Verbalized understanding. She said she would call back at the end of March to schedule an appointment for April

## 2022-04-19 ENCOUNTER — Encounter (HOSPITAL_COMMUNITY): Payer: Medicare Other

## 2022-04-20 ENCOUNTER — Inpatient Hospital Stay: Payer: Medicare Other

## 2022-04-20 VITALS — BP 140/66 | HR 66 | Temp 97.1°F | Resp 18

## 2022-04-20 DIAGNOSIS — D75839 Thrombocytosis, unspecified: Secondary | ICD-10-CM | POA: Diagnosis not present

## 2022-04-20 DIAGNOSIS — M549 Dorsalgia, unspecified: Secondary | ICD-10-CM | POA: Diagnosis not present

## 2022-04-20 DIAGNOSIS — J449 Chronic obstructive pulmonary disease, unspecified: Secondary | ICD-10-CM | POA: Diagnosis not present

## 2022-04-20 DIAGNOSIS — R079 Chest pain, unspecified: Secondary | ICD-10-CM | POA: Diagnosis not present

## 2022-04-20 DIAGNOSIS — D500 Iron deficiency anemia secondary to blood loss (chronic): Secondary | ICD-10-CM

## 2022-04-20 DIAGNOSIS — D509 Iron deficiency anemia, unspecified: Secondary | ICD-10-CM | POA: Diagnosis not present

## 2022-04-20 DIAGNOSIS — D72829 Elevated white blood cell count, unspecified: Secondary | ICD-10-CM | POA: Diagnosis not present

## 2022-04-20 MED ORDER — SODIUM CHLORIDE 0.9 % IV SOLN: Freq: Once | INTRAVENOUS | Status: AC

## 2022-04-20 MED ORDER — SODIUM CHLORIDE 0.9 % IV SOLN
300.0000 mg | Freq: Once | INTRAVENOUS | Status: AC
  Administered 2022-04-20: 300 mg via INTRAVENOUS
  Filled 2022-04-20: qty 300

## 2022-04-20 NOTE — Progress Notes (Signed)
Patient presents today for iron infusion.  Patient is in satisfactory condition with no complaints voiced.  Vital signs are stable.  Tylenol and Claritin taken at 0940 at home prior to visit.  We will proceed with infusion per provider orders.   Patient tolerated treatment well with no complaints voiced.  Patient left ambulatory in stable condition.  Vital signs stable at discharge.  Follow up as scheduled.

## 2022-04-20 NOTE — Patient Instructions (Signed)
[facility]  Discharge Instructions: Thank you for choosing [facility] to provide your oncology and hematology care.  If you have a lab appointment with the Cancer Center, please come in thru the Main Entrance and check in at the main information desk.  Wear comfortable clothing and clothing appropriate for easy access to any Portacath or PICC line.   We strive to give you quality time with your provider. You may need to reschedule your appointment if you arrive late (15 or more minutes).  Arriving late affects you and other patients whose appointments are after yours.  Also, if you miss three or more appointments without notifying the office, you may be dismissed from the clinic at the provider's discretion.      For prescription refill requests, have your pharmacy contact our office and allow 72 hours for refills to be completed.    Iron Sucrose Injection What is this medication? IRON SUCROSE (EYE ern SOO krose) treats low levels of iron (iron deficiency anemia) in people with kidney disease. Iron is a mineral that plays an important role in making red blood cells, which carry oxygen from your lungs to the rest of your body. This medicine may be used for other purposes; ask your health care provider or pharmacist if you have questions. COMMON BRAND NAME(S): Venofer What should I tell my care team before I take this medication? They need to know if you have any of these conditions: Anemia not caused by low iron levels Heart disease High levels of iron in the blood Kidney disease Liver disease An unusual or allergic reaction to iron, other medications, foods, dyes, or preservatives Pregnant or trying to get pregnant Breastfeeding How should I use this medication? This medication is for infusion into a vein. It is given in a hospital or clinic setting. Talk to your care team about the use of this medication in children. While this medication may be  prescribed for children as young as 2 years for selected conditions, precautions do apply. Overdosage: If you think you have taken too much of this medicine contact a poison control center or emergency room at once. NOTE: This medicine is only for you. Do not share this medicine with others. What if I miss a dose? Keep appointments for follow-up doses. It is important not to miss your dose. Call your care team if you are unable to keep an appointment. What may interact with this medication? Do not take this medication with any of the following: Deferoxamine Dimercaprol Other iron products This medication may also interact with the following: Chloramphenicol Deferasirox This list may not describe all possible interactions. Give your health care provider a list of all the medicines, herbs, non-prescription drugs, or dietary supplements you use. Also tell them if you smoke, drink alcohol, or use illegal drugs. Some items may interact with your medicine. What should I watch for while using this medication? Visit your care team regularly. Tell your care team if your symptoms do not start to get better or if they get worse. You may need blood work done while you are taking this medication. You may need to follow a special diet. Talk to your care team. Foods that contain iron include: whole grains/cereals, dried fruits, beans, or peas, leafy green vegetables, and organ meats (liver, kidney). What side effects may I notice from receiving this medication? Side effects that you should report to your care team as soon as possible: Allergic reactions--skin rash, itching, hives,   swelling of the face, lips, tongue, or throat Low blood pressure--dizziness, feeling faint or lightheaded, blurry vision Shortness of breath Side effects that usually do not require medical attention (report to your care team if they continue or are bothersome): Flushing Headache Joint pain Muscle pain Nausea Pain, redness, or  irritation at injection site This list may not describe all possible side effects. Call your doctor for medical advice about side effects. You may report side effects to FDA at 1-800-FDA-1088. Where should I keep my medication? This medication is given in a hospital or clinic and will not be stored at home. NOTE: This sheet is a summary. It may not cover all possible information. If you have questions about this medicine, talk to your doctor, pharmacist, or health care provider.  2023 Elsevier/Gold Standard (2020-06-23 00:00:00)    To help prevent nausea and vomiting after your treatment, we encourage you to take your nausea medication as directed.  BELOW ARE SYMPTOMS THAT SHOULD BE REPORTED IMMEDIATELY: *FEVER GREATER THAN 100.4 F (38 C) OR HIGHER *CHILLS OR SWEATING *NAUSEA AND VOMITING THAT IS NOT CONTROLLED WITH YOUR NAUSEA MEDICATION *UNUSUAL SHORTNESS OF BREATH *UNUSUAL BRUISING OR BLEEDING *URINARY PROBLEMS (pain or burning when urinating, or frequent urination) *BOWEL PROBLEMS (unusual diarrhea, constipation, pain near the anus) TENDERNESS IN MOUTH AND THROAT WITH OR WITHOUT PRESENCE OF ULCERS (sore throat, sores in mouth, or a toothache) UNUSUAL RASH, SWELLING OR PAIN  UNUSUAL VAGINAL DISCHARGE OR ITCHING   Items with * indicate a potential emergency and should be followed up as soon as possible or go to the Emergency Department if any problems should occur.  Please show the CHEMOTHERAPY ALERT CARD or IMMUNOTHERAPY ALERT CARD at check-in to the Emergency Department and triage nurse.  Should you have questions after your visit or need to cancel or reschedule your appointment, please contact [facility] [phone]  and follow the prompts.  Office hours are 8:00 a.m. to 4:30 p.m. Monday - Friday. Please note that voicemails left after 4:00 p.m. may not be returned until the following business day.  We are closed weekends and major holidays. You have access to  a nurse at all times for urgent questions. Please call the main number to the clinic [phone] and follow the prompts.  For any non-urgent questions, you may also contact your provider using MyChart. We now offer e-Visits for anyone 18 and older to request care online for non-urgent symptoms. For details visit [email or web address].   Also download the MyChart app! Go to the app store, search "MyChart", open the app, select [facility], and log in with your MyChart username and password.   

## 2022-04-23 ENCOUNTER — Encounter (HOSPITAL_COMMUNITY): Admission: RE | Payer: Self-pay | Source: Ambulatory Visit

## 2022-04-23 ENCOUNTER — Ambulatory Visit (HOSPITAL_COMMUNITY): Admission: RE | Admit: 2022-04-23 | Payer: Medicare Other | Source: Ambulatory Visit

## 2022-04-23 SURGERY — COLONOSCOPY WITH PROPOFOL
Anesthesia: Monitor Anesthesia Care

## 2022-04-23 NOTE — Telephone Encounter (Signed)
Pts CARDIAC PET SCAN is scheduled for 05/08/22 at 10 am.  Pt made aware of appt date and time by [facility] PET Scheduling team.

## 2022-04-25 DIAGNOSIS — J449 Chronic obstructive pulmonary disease, unspecified: Secondary | ICD-10-CM | POA: Diagnosis not present

## 2022-04-25 DIAGNOSIS — I10 Essential (primary) hypertension: Secondary | ICD-10-CM | POA: Diagnosis not present

## 2022-04-30 DIAGNOSIS — R7981 Abnormal blood-gas level: Secondary | ICD-10-CM | POA: Diagnosis not present

## 2022-04-30 DIAGNOSIS — J449 Chronic obstructive pulmonary disease, unspecified: Secondary | ICD-10-CM | POA: Diagnosis not present

## 2022-04-30 DIAGNOSIS — I739 Peripheral vascular disease, unspecified: Secondary | ICD-10-CM | POA: Diagnosis not present

## 2022-04-30 DIAGNOSIS — Z6822 Body mass index (BMI) 22.0-22.9, adult: Secondary | ICD-10-CM | POA: Diagnosis not present

## 2022-04-30 DIAGNOSIS — F419 Anxiety disorder, unspecified: Secondary | ICD-10-CM | POA: Diagnosis not present

## 2022-05-02 ENCOUNTER — Other Ambulatory Visit: Payer: Self-pay

## 2022-05-02 DIAGNOSIS — Z122 Encounter for screening for malignant neoplasm of respiratory organs: Secondary | ICD-10-CM

## 2022-05-02 DIAGNOSIS — Z87891 Personal history of nicotine dependence: Secondary | ICD-10-CM

## 2022-05-02 NOTE — Telephone Encounter (Signed)
Unable to reach to discuss results of LDCT.  Letter sent through my chart explaining results and to follow up with Dr. Melvyn Novas for more thorough review at next visit.  Order placed for annual LDCT and results/plan faxed to PCP

## 2022-05-04 ENCOUNTER — Telehealth (HOSPITAL_COMMUNITY): Payer: Self-pay | Admitting: *Deleted

## 2022-05-04 NOTE — Telephone Encounter (Signed)
Attempted to call patient regarding upcoming cardiac PET appointment. Left message on voicemail with name and callback number  Gordy Clement RN Navigator Cardiac Imaging [facility] [phone] Office [phone] Cell  Reminder left for patient to avoid caffeine 12 hours prior to her appt.

## 2022-05-04 NOTE — Telephone Encounter (Signed)
Reaching out to patient to offer assistance regarding upcoming cardiac imaging study; pt verbalizes understanding of appt date/time, parking situation and where to check in, pre-test NPO status and verified current allergies; name and call back number provided for further questions should they arise  Gordy Clement RN Navigator Cardiac Imaging [facility] [phone] office [phone] cell  Patient aware to avoid caffeine 12 hours prior to her cardiac PET scan.

## 2022-05-08 ENCOUNTER — Other Ambulatory Visit: Payer: Self-pay

## 2022-05-08 ENCOUNTER — Encounter (HOSPITAL_COMMUNITY)
Admission: RE | Admit: 2022-05-08 | Discharge: 2022-05-08 | Disposition: A | Discharge status: Home / Self Care | Payer: Medicare Other | Source: Ambulatory Visit | Attending: Cardiology | Admitting: Cardiology

## 2022-05-08 DIAGNOSIS — R0609 Other forms of dyspnea: Secondary | ICD-10-CM

## 2022-05-08 DIAGNOSIS — I2584 Coronary atherosclerosis due to calcified coronary lesion: Secondary | ICD-10-CM | POA: Diagnosis not present

## 2022-05-08 DIAGNOSIS — I739 Peripheral vascular disease, unspecified: Secondary | ICD-10-CM

## 2022-05-08 DIAGNOSIS — I700 Atherosclerosis of aorta: Secondary | ICD-10-CM | POA: Diagnosis not present

## 2022-05-08 DIAGNOSIS — I2510 Atherosclerotic heart disease of native coronary artery without angina pectoris: Secondary | ICD-10-CM | POA: Diagnosis not present

## 2022-05-08 LAB — NM PET CT CARDIAC PERFUSION MULTI W/ABSOLUTE BLOODFLOW
LV dias vol: 61 mL (ref 46–106)
LV sys vol: 14 mL
MBFR: 1.81
Nuc Rest EF: 72 %
Nuc Stress EF: 77 %
Rest MBF: 1.45 ml/g/min
Rest Nuclear Isotope Dose: 14.2 mCi
ST Depression (mm): 0 mm
Stress MBF: 2.63 ml/g/min
Stress Nuclear Isotope Dose: 14.2 mCi
TID: 1.23

## 2022-05-08 MED ORDER — REGADENOSON 0.4 MG/5ML IV SOLN
INTRAVENOUS | Status: AC
  Administered 2022-05-08: 0.4 mg via INTRAVENOUS
  Filled 2022-05-08: qty 5

## 2022-05-08 MED ORDER — REGADENOSON 0.4 MG/5ML IV SOLN: 0.4000 mg | Freq: Once | INTRAVENOUS | Status: AC

## 2022-05-08 MED ORDER — RUBIDIUM RB82 GENERATOR (RUBYFILL)
14.0000 | PACK | Freq: Once | INTRAVENOUS | Status: AC
  Administered 2022-05-08: 14.2 via INTRAVENOUS

## 2022-05-08 MED ORDER — DEXTROSE 5 % IV SOLN
INTRAVENOUS | Status: AC
  Filled 2022-05-08: qty 50

## 2022-05-08 MED ORDER — CAFFEINE CITRATE BASE COMPONENT 10 MG/ML IV SOLN
INTRAVENOUS | Status: AC
  Filled 2022-05-08: qty 3

## 2022-05-10 ENCOUNTER — Encounter: Payer: Self-pay | Admitting: *Deleted

## 2022-05-16 ENCOUNTER — Ambulatory Visit (INDEPENDENT_AMBULATORY_CARE_PROVIDER_SITE_OTHER): Payer: Medicare Other

## 2022-05-16 ENCOUNTER — Ambulatory Visit (INDEPENDENT_AMBULATORY_CARE_PROVIDER_SITE_OTHER): Payer: Medicare Other | Admitting: Vascular Surgery

## 2022-05-16 ENCOUNTER — Encounter: Payer: Self-pay | Admitting: Vascular Surgery

## 2022-05-16 VITALS — BP 143/74 | HR 77 | Temp 97.9°F | Ht 64.5 in | Wt 124.6 lb

## 2022-05-16 DIAGNOSIS — I739 Peripheral vascular disease, unspecified: Secondary | ICD-10-CM

## 2022-05-16 DIAGNOSIS — I2510 Atherosclerotic heart disease of native coronary artery without angina pectoris: Secondary | ICD-10-CM | POA: Diagnosis not present

## 2022-05-16 DIAGNOSIS — I2584 Coronary atherosclerosis due to calcified coronary lesion: Secondary | ICD-10-CM | POA: Diagnosis not present

## 2022-05-16 LAB — VAS US ABI WITH/WO TBI
Left ABI: 0.85
Right ABI: 0.92

## 2022-05-16 NOTE — Progress Notes (Signed)
[facility] [place]  Patient name: Desiree Barber MRN: [id number] DOB: [birth year] Sex: female  REASON FOR VISIT: Follow-up recent endovascular treatment of lifestyle limiting right leg claudication with Dr. Trula Slade.  She underwent aortogram with lower extremity runoff on 04/10/2022.  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  She underwent treatment with kissing stents at the aortic bifurcation and also separate stenting to her right external iliac artery.  He suffered no complications and was discharged home.  HPI: Desiree Barber is a 72 y.o. female she reports no new difficulty.  She has been concerned about her ability to walk.  I explained that she is certainly encouraged to walk is much as possible.  She was concerned that she may cause some harm.  He has had no groin complications  Past Medical History:  Diagnosis Date   Anemia    Anxiety    Occasional   Arthritis    COPD (chronic obstructive pulmonary disease) (HCC)    HTN (hypertension)    Iron deficiency anemia due to chronic blood loss 03/28/2022    Family History  Problem Relation Age of Onset   Colon cancer Neg Hx    Breast cancer Neg Hx     SOCIAL HISTORY: Social History   Tobacco Use   Smoking status: Former    Packs/day: 0.50    Years: 55.00    Total pack years: 27.50    Types: Cigarettes    Quit date: 05/29/2021    Years since quitting: 0.9    Passive exposure: Past   Smokeless tobacco: Former   Tobacco comments:    Quit date 05/29/2021  Substance Use Topics   Alcohol use: Yes    Alcohol/week: 0.0 standard drinks of alcohol    Comment: 3 beers a day    Allergies  Allergen Reactions   Demerol [Meperidine] Other (See Comments)    Unknown   Oxycontin [Oxycodone Hcl] Other (See Comments)    Unknown   Fish Allergy Nausea Only    Only fish from [place]   Lavender Oil    Other     Flower oils      Current Outpatient Medications  Medication Sig Dispense Refill   albuterol (VENTOLIN HFA) 108 (90 Base) MCG/ACT inhaler Inhale into the lungs.     ALPRAZolam (XANAX) 0.25 MG tablet Take 0.25 mg by mouth daily as needed for anxiety or sleep.     Ascorbic Acid (VITAMIN C) 100 MG tablet Take 100 mg by mouth daily.     aspirin EC 81 MG tablet Take 1 tablet (81 mg total) by mouth daily. Swallow whole. 150 tablet 2   clopidogrel (PLAVIX) 75 MG tablet Take 1 tablet (75 mg total) by mouth daily. 30 tablet 11   losartan (COZAAR) 25 MG tablet Take 1 tablet (25 mg total) by mouth daily. 90 tablet 2   pantoprazole (PROTONIX) 40 MG tablet Take 1 tablet (40 mg total) by mouth daily. 30 tablet 11   rosuvastatin (CRESTOR) 20 MG tablet Take 1 tablet (20 mg total) by mouth daily. 90 tablet 2   TRELEGY ELLIPTA 100-62.5-25 MCG/ACT AEPB Inhale 1 puff into the lungs daily.     umeclidinium-vilanterol (ANORO ELLIPTA) 62.5-25 MCG/ACT AEPB Inhale 1 puff into the lungs daily. 1 each 11   vitamin B-12 (CYANOCOBALAMIN) 100 MCG tablet Take 100 mcg by mouth daily.     VITAMIN D, CHOLECALCIFEROL, PO Take by mouth. One daily  ferrous sulfate 325 (65 FE) MG EC tablet Take 325 mg by mouth daily. (Patient not taking: Reported on 05/16/2022)     No current facility-administered medications for this visit.    REVIEW OF SYSTEMS:  [X]$  denotes positive finding, [ ]$  denotes negative finding Cardiac  Comments:  Chest pain or chest pressure:    Shortness of breath upon exertion:    Short of breath when lying flat:    Irregular heart rhythm:        Vascular    Pain in calf, thigh, or hip brought on by ambulation:    Pain in feet at night that wakes you up from your sleep:     Blood clot in your veins:    Leg swelling:           PHYSICAL EXAM: Vitals:   05/16/22 0848  BP: (!) 143/74  Pulse: 77  Temp: 97.9 F (36.6 C)  SpO2: 96%  Weight: 124 lb 9.6 oz (56.5 kg)  Height: 5' 4.5" (1.638 m)    GENERAL: The  patient is a well-nourished female, in no acute distress. The vital signs are documented above. CARDIOVASCULAR: 2+ femoral pulses bilaterally.  She does have a 2+ right dorsalis pedis pulse.  I do not palpate pedal pulses on the left. PULMONARY: There is good air exchange  MUSCULOSKELETAL: There are no major deformities or cyanosis. NEUROLOGIC: No focal weakness or paresthesias are detected. SKIN: There are no ulcers or rashes noted. PSYCHIATRIC: The patient has a normal affect.  DATA:  Noninvasive studies reveal improvement in her right with an ankle arm index of 0.92 which compares to 0.77 preoperatively.  Her left ankle arm index is 0.85  Duplex imaging that showed patent stents bilaterally.  MEDICAL ISSUES: Stable overall.  Will continue her walking program and is courage to keep continue to increase walking.  We will see her again in 6 months with ABI and imaging of her stents with duplex.  She will notify should she develop any new symptoms    Rosetta Posner, MD FACS [facility] [place] Office Tel [phone]  Note: Portions of this report may have been transcribed using voice recognition software.  Every effort has been made to ensure accuracy; however, inadvertent computerized transcription errors may still be present.

## 2022-05-24 ENCOUNTER — Encounter: Payer: Self-pay | Admitting: Internal Medicine

## 2022-05-25 ENCOUNTER — Inpatient Hospital Stay: Payer: Medicare Other | Attending: Hematology

## 2022-05-25 DIAGNOSIS — I2510 Atherosclerotic heart disease of native coronary artery without angina pectoris: Secondary | ICD-10-CM | POA: Insufficient documentation

## 2022-05-25 DIAGNOSIS — R079 Chest pain, unspecified: Secondary | ICD-10-CM | POA: Insufficient documentation

## 2022-05-25 DIAGNOSIS — D72829 Elevated white blood cell count, unspecified: Secondary | ICD-10-CM | POA: Insufficient documentation

## 2022-05-25 DIAGNOSIS — D509 Iron deficiency anemia, unspecified: Secondary | ICD-10-CM | POA: Diagnosis not present

## 2022-05-25 DIAGNOSIS — Z7902 Long term (current) use of antithrombotics/antiplatelets: Secondary | ICD-10-CM | POA: Diagnosis not present

## 2022-05-25 DIAGNOSIS — F419 Anxiety disorder, unspecified: Secondary | ICD-10-CM | POA: Insufficient documentation

## 2022-05-25 DIAGNOSIS — D75839 Thrombocytosis, unspecified: Secondary | ICD-10-CM | POA: Diagnosis not present

## 2022-05-25 DIAGNOSIS — R0602 Shortness of breath: Secondary | ICD-10-CM | POA: Diagnosis not present

## 2022-05-25 DIAGNOSIS — Z885 Allergy status to narcotic agent status: Secondary | ICD-10-CM | POA: Insufficient documentation

## 2022-05-25 DIAGNOSIS — R002 Palpitations: Secondary | ICD-10-CM | POA: Insufficient documentation

## 2022-05-25 DIAGNOSIS — Z87891 Personal history of nicotine dependence: Secondary | ICD-10-CM | POA: Diagnosis not present

## 2022-05-25 DIAGNOSIS — D500 Iron deficiency anemia secondary to blood loss (chronic): Secondary | ICD-10-CM

## 2022-05-25 DIAGNOSIS — Z8719 Personal history of other diseases of the digestive system: Secondary | ICD-10-CM | POA: Insufficient documentation

## 2022-05-25 DIAGNOSIS — Z79899 Other long term (current) drug therapy: Secondary | ICD-10-CM | POA: Diagnosis not present

## 2022-05-25 DIAGNOSIS — Z9851 Tubal ligation status: Secondary | ICD-10-CM | POA: Diagnosis not present

## 2022-05-25 DIAGNOSIS — R200 Anesthesia of skin: Secondary | ICD-10-CM | POA: Insufficient documentation

## 2022-05-25 DIAGNOSIS — Z801 Family history of malignant neoplasm of trachea, bronchus and lung: Secondary | ICD-10-CM | POA: Diagnosis not present

## 2022-05-25 DIAGNOSIS — I10 Essential (primary) hypertension: Secondary | ICD-10-CM | POA: Insufficient documentation

## 2022-05-25 DIAGNOSIS — J449 Chronic obstructive pulmonary disease, unspecified: Secondary | ICD-10-CM | POA: Insufficient documentation

## 2022-05-25 DIAGNOSIS — M1990 Unspecified osteoarthritis, unspecified site: Secondary | ICD-10-CM | POA: Insufficient documentation

## 2022-05-25 LAB — CBC WITH DIFFERENTIAL/PLATELET
Abs Immature Granulocytes: 0.04 10*3/uL (ref 0.00–0.07)
Basophils Absolute: 0.1 10*3/uL (ref 0.0–0.1)
Basophils Relative: 1 %
Eosinophils Absolute: 0.4 10*3/uL (ref 0.0–0.5)
Eosinophils Relative: 4 %
HCT: 32.3 % — ABNORMAL LOW (ref 36.0–46.0)
Hemoglobin: 9.3 g/dL — ABNORMAL LOW (ref 12.0–15.0)
Immature Granulocytes: 1 %
Lymphocytes Relative: 12 %
Lymphs Abs: 1.1 10*3/uL (ref 0.7–4.0)
MCH: 28.5 pg (ref 26.0–34.0)
MCHC: 28.8 g/dL — ABNORMAL LOW (ref 30.0–36.0)
MCV: 99.1 fL (ref 80.0–100.0)
Monocytes Absolute: 0.9 10*3/uL (ref 0.1–1.0)
Monocytes Relative: 11 %
Neutro Abs: 6.2 10*3/uL (ref 1.7–7.7)
Neutrophils Relative %: 71 %
Platelets: 464 10*3/uL — ABNORMAL HIGH (ref 150–400)
RBC: 3.26 MIL/uL — ABNORMAL LOW (ref 3.87–5.11)
RDW: 17.3 % — ABNORMAL HIGH (ref 11.5–15.5)
WBC: 8.6 10*3/uL (ref 4.0–10.5)
nRBC: 0 % (ref 0.0–0.2)

## 2022-05-25 LAB — FERRITIN: Ferritin: 16 ng/mL (ref 11–307)

## 2022-05-25 LAB — IRON AND TIBC
Iron: 17 ug/dL — ABNORMAL LOW (ref 28–170)
Saturation Ratios: 4 % — ABNORMAL LOW (ref 10.4–31.8)
TIBC: 401 ug/dL (ref 250–450)
UIBC: 384 ug/dL

## 2022-05-25 LAB — LACTATE DEHYDROGENASE: LDH: 149 U/L (ref 98–192)

## 2022-05-25 NOTE — Telephone Encounter (Signed)
  Hi Dr Melvyn Novas I have started trying to increase my walking after I had some leg arteries opened, and I get so short of breath. I've had cardiac work up and thankfully negative for obstructive disease.  My PCP said I should ask you if you thought trelegy might be more helpful for me, or if anoro is best.  I understand I have bad copd and not looking for miracles, but curious if may help. Thanks and whichever one you think I should be on, will you please send Rx to [facility]?   Have a good day, Desiree Barber

## 2022-05-25 NOTE — Telephone Encounter (Signed)
No problem with trial but the ingredients that help the breathing are the same in anoro and the third ingredient is to prevent flares, not to treat doe   Rec  Ov next avail with all meds in hand

## 2022-06-01 ENCOUNTER — Ambulatory Visit: Payer: Medicare Other | Admitting: Physician Assistant

## 2022-06-01 LAB — BCR-ABL1 FISH
Cells Analyzed: 200
Cells Counted: 200

## 2022-06-04 LAB — JAK2 V617F RFX CALR/MPL/E12-15

## 2022-06-04 LAB — CALR +MPL + E12-E15  (REFLEX)

## 2022-06-07 NOTE — Progress Notes (Signed)
[place], [place] [address]   CLINIC:  Medical Oncology/Hematology  PCP:  Sharilyn Sites, [place] [id number] [phone]   REASON FOR VISIT:  Follow-up for leukocytosis and thrombocytosis, iron deficiency anemia   PRIOR THERAPY: None   CURRENT THERAPY: Intermittent IV iron  INTERVAL HISTORY:   Desiree Barber 72 y.o. female returns for routine follow-up of her iron deficiency anemia, leukocytosis, thrombocytosis.  She was last seen by Tarri Abernethy PA-C on 03/28/2022.  She received Venofer 300 mg x 3 in January 2024.  She reports feeling improved energy after her IV iron infusions.  She denies any melena, rectal bleeding, or other signs of abnormal blood loss.  She previously took [place] powder twice daily, but has cut back since her last visit; reports that she now only uses ibuprofen on rare occasions (proximately 6 times over the past 2 months).    She reports "decent energy" about 75%.  Occasional chest pain that she attributes to acid reflux.  Baseline dyspnea on exertion from COPD.  No pica, lightheadedness, syncope, or headaches. Her leg claudication has improved after her vascular surgery.   She continues to deny any fevers, chills, night sweats, unintentional weight loss.  No recurrent infections.  No history of thrombosis, aquagenic pruritus, or vasomotor symptoms.    She continues to use Trelegy (steroid containing inhaler) daily for her COPD, and has required systemic steroids on multiple occasions, most recently completed prednisone taper on 01/03/2022.  She has 75% energy and 75% appetite. She endorses that she is maintaining a stable weight.   ASSESSMENT & PLAN:  1.   Leukocytosis and thrombocytosis: - Patient seen at the request of Dr. Hilma Favors for elevated white count and platelet count. - Labs from PCP (12/21/2021): WBC 13.3, Hgb 10.7/MCV 84, platelets 511 - Review of lab trends shows intermittently  elevated white blood cells over the past 5+ years - She was admitted to the hospital from 12/23/2021 through 12/24/2021, treated with steroids and antibiotics for possible COPD exacerbation/infection. - She completed prednisone taper on 01/03/2022.  She uses Trelegy inhaler daily. - Denies any fevers or night sweats.  Gained 22 pounds since she quit smoking.  No recurrent infections.  No vasomotor symptoms/aquagenic pruritus/prior thrombosis. - CBC (01/16/2022), checked 2 weeks after completion of steroid taper, showed normalization of WBC at 7.4 with normal differential. - Additional hematology work-up (01/16/2022): Normal LDH.  Reticulocytes 2.5%. Significant iron deficiency with ferritin 4, iron saturation 3%, and TIBC 487 Normal B12 and folate. JAK2, CALR, MPL, Exons 12-15, BCR/ABL FISH negative - Most recent CBC (05/25/2022): WBC 8.6/normal differential, platelets 464 - Clinical picture favors reactive thrombocytosis from iron deficiency.  Leukocytosis likely reactive in the setting of COPD and frequent steroids, including chronic steroid inhaler (Trelegy) - PLAN: MPN workup negative.  Monitor with periodic CBC/D and consider bone marrow biopsy if any major deviations from baseline.   2.  Iron deficiency anemia - Labs from 01/16/2022 show Hgb 10.0/MCV 87.1, ferritin 4, iron saturation 3%, TIBC 487.  B12, folate, LDH, and reticulocytes were normal.  Recent CMP showed normal kidney function. - Hemoccult stool positive x 3 (November 2023).  Referred to GI and had been scheduled for EGD/colonoscopy in January 2024, but this was postponed due to another procedure. - She previously took [place] powder twice daily, is now using "occasional" ibuprofen (approximately once a week) - Denies any rectal bleeding or melena  - Received Venofer 300 mg x 3  in January 2024 - Most recent labs (05/25/2022): Hgb 9.3/MCV 99.1, platelets 464.  Ferritin 16, iron saturation 4%. - PLAN: Worsening anemia and iron deficiency  despite IV iron repletion is suspicious for ongoing GI blood loss. - We will schedule her for IV Venofer 300 mg x 4 - Monthly CBC - will add SPEP, immunofixation, light chains, B12, MMA, folate, copper, reticulocytes, LDH to next lab draw - Repeat CBC/D, CMP, iron panel in 2 months - Office visit in 2 months - Discussed with patient the importance of GI follow-up for EGD/colonoscopy  3.  Smoking history - She smoked 1-2 PPD x55 years, quit in March 2023 - This patient meets criteria for low-dose CT lung cancer screening until age 57 - She receives annual lung cancer screening via NP Eric Form at [facility] pulmonology   4.   Other history - PMH: COPD, hypertension, anxiety, arthritis, coronary artery disease, peripheral arterial disease with leg claudication - She lives at home with her husband.  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  Quit smoking on 05/29/2021.  Smoked 1 pack/day for 55 years. - No family history of leukemia.  Maternal uncle had lung cancer and was smoker.  PLAN SUMMARY: >> Venofer 300 mg x 4 >> Labs in 1 month = CBC, SPEP, immunofixation, light chains, B12, MMA, folate, copper, reticulocytes, LDH >> Labs in 2 months = CBC/D, CMP, ferritin, iron/TIBC >> OFFICE visit in 2 months      REVIEW OF SYSTEMS:   Review of Systems  Constitutional:  Negative for appetite change, chills, diaphoresis, fatigue, fever and unexpected weight change.  HENT:   Negative for lump/mass and nosebleeds.   Eyes:  Negative for eye problems.  Respiratory:  Positive for shortness of breath (with exertion). Negative for cough and hemoptysis.   Cardiovascular:  Positive for palpitations. Negative for chest pain and leg swelling.  Gastrointestinal:  Negative for abdominal pain, blood in stool, constipation, diarrhea, nausea and vomiting.  Genitourinary:  Negative for hematuria.   Skin: Negative.   Neurological:  Positive for numbness. Negative for  dizziness, headaches and light-headedness.  Hematological:  Does not bruise/bleed easily.     PHYSICAL EXAM:  ECOG PERFORMANCE STATUS: 1 - Symptomatic but completely ambulatory  There were no vitals filed for this visit. There were no vitals filed for this visit. Physical Exam Constitutional:      Appearance: Normal appearance. She is normal weight.  Cardiovascular:     Heart sounds: Normal heart sounds.  Pulmonary:     Breath sounds: Normal breath sounds.  Neurological:     General: No focal deficit present.     Mental Status: Mental status is at baseline.  Psychiatric:        Behavior: Behavior normal. Behavior is cooperative.    PAST MEDICAL/SURGICAL HISTORY:  Past Medical History:  Diagnosis Date   Anemia    Anxiety    Occasional   Arthritis    COPD (chronic obstructive pulmonary disease) (HCC)    HTN (hypertension)    Iron deficiency anemia due to chronic blood loss 03/28/2022   Past Surgical History:  Procedure Laterality Date   ABDOMINAL AORTOGRAM W/LOWER EXTREMITY N/A 04/10/2022   Procedure: ABDOMINAL AORTOGRAM W/LOWER EXTREMITY;  Surgeon: Serafina Mitchell, MD;  Location: [facility];  Service: Cardiovascular;  Laterality: N/A;   COLONOSCOPY  02/23/04   RMR: normal rectum and colon.minimal internal hemorrhoids   COLONOSCOPY WITH PROPOFOL N/A 06/13/2015   Procedure: COLONOSCOPY  WITH PROPOFOL;  Surgeon: Daneil Dolin, MD;  [facility];  Service: Endoscopy;  Laterality: N/A;  1045   POLYPECTOMY  06/13/2015   Procedure: POLYPECTOMY;  Surgeon: Daneil Dolin, MD;  Location: [facility];  Service: Endoscopy;;  sigmoid colon polyp   TOOTH EXTRACTION     TRIGGER FINGER RELEASE Bilateral    TUBAL LIGATION      SOCIAL HISTORY:  Social History   Socioeconomic History   Marital status: Married    Spouse name: Not on file   Number of children: Not on file   Years of education: Not on file   Highest education level: Not on file  Occupational  History   Not on file  Tobacco Use   Smoking status: Former    Packs/day: 0.50    Years: 55.00    Additional pack years: 0.00    Total pack years: 27.50    Types: Cigarettes    Quit date: 05/29/2021    Years since quitting: 1.0    Passive exposure: Past   Smokeless tobacco: Former   Tobacco comments:    Quit date 05/29/2021  Vaping Use   Vaping Use: Never used  Substance and Sexual Activity   Alcohol use: Yes    Alcohol/week: 0.0 standard drinks of alcohol    Comment: 3 beers a day   Drug use: No   Sexual activity: Yes    Birth control/protection: Surgical  Other Topics Concern   Not on file  Social History Narrative   Not on file   Social Determinants of Health   Financial Resource Strain: Not on file  Food Insecurity: Unknown (12/24/2021)   Hunger Vital Sign    Worried About Running Out of Food in the Last Year: Never true    Ran Out of Food in the Last Year: Not on file  Transportation Needs: No Transportation Needs (12/24/2021)   PRAPARE - Hydrologist (Medical): No    Lack of Transportation (Non-Medical): No  Physical Activity: Not on file  Stress: Not on file  Social Connections: Not on file  Intimate Partner Violence: Not At Risk (12/24/2021)   Humiliation, Afraid, Rape, and Kick questionnaire    Fear of Current or Ex-Partner: No    Emotionally Abused: No    Physically Abused: No    Sexually Abused: No    FAMILY HISTORY:  Family History  Problem Relation Age of Onset   Colon cancer Neg Hx    Breast cancer Neg Hx     CURRENT MEDICATIONS:  Outpatient Encounter Medications as of 06/08/2022  Medication Sig   albuterol (VENTOLIN HFA) 108 (90 Base) MCG/ACT inhaler Inhale into the lungs.   ALPRAZolam (XANAX) 0.25 MG tablet Take 0.25 mg by mouth daily as needed for anxiety or sleep.   Ascorbic Acid (VITAMIN C) 100 MG tablet Take 100 mg by mouth daily.   aspirin EC 81 MG tablet Take 1 tablet (81 mg total) by mouth daily. Swallow whole.    clopidogrel (PLAVIX) 75 MG tablet Take 1 tablet (75 mg total) by mouth daily.   ferrous sulfate 325 (65 FE) MG EC tablet Take 325 mg by mouth daily. (Patient not taking: Reported on 05/16/2022)   losartan (COZAAR) 25 MG tablet Take 1 tablet (25 mg total) by mouth daily.   pantoprazole (PROTONIX) 40 MG tablet Take 1 tablet (40 mg total) by mouth daily.   rosuvastatin (CRESTOR) 20 MG tablet Take 1 tablet (20 mg total)  by mouth daily.   TRELEGY ELLIPTA 100-62.5-25 MCG/ACT AEPB Inhale 1 puff into the lungs daily.   umeclidinium-vilanterol (ANORO ELLIPTA) 62.5-25 MCG/ACT AEPB Inhale 1 puff into the lungs daily.   vitamin B-12 (CYANOCOBALAMIN) 100 MCG tablet Take 100 mcg by mouth daily.   VITAMIN D, CHOLECALCIFEROL, PO Take by mouth. One daily   No facility-administered encounter medications on file as of 06/08/2022.    ALLERGIES:  Allergies  Allergen Reactions   Demerol [Meperidine] Other (See Comments)    Unknown   Oxycontin [Oxycodone Hcl] Other (See Comments)    Unknown   Fish Allergy Nausea Only    Only fish from [place]   Lavender Oil    Other     Flower oils     LABORATORY DATA:  I have reviewed the labs as listed.  CBC    Component Value Date/Time   WBC 8.6 05/25/2022 0948   RBC 3.26 (L) 05/25/2022 0948   HGB 9.3 (L) 05/25/2022 0948   HCT 32.3 (L) 05/25/2022 0948   PLT 464 (H) 05/25/2022 0948   MCV 99.1 05/25/2022 0948   MCH 28.5 05/25/2022 0948   MCHC 28.8 (L) 05/25/2022 0948   RDW 17.3 (H) 05/25/2022 0948   LYMPHSABS 1.1 05/25/2022 0948   MONOABS 0.9 05/25/2022 0948   EOSABS 0.4 05/25/2022 0948   BASOSABS 0.1 05/25/2022 0948      Latest Ref Rng & Units 04/10/2022    5:56 AM 03/01/2022    8:41 AM 12/24/2021    4:54 AM  CMP  Glucose 70 - 99 mg/dL 93  97  140   BUN 8 - 23 mg/dL 9  11  7    Creatinine 0.44 - 1.00 mg/dL 0.60  0.79  0.66   Sodium 135 - 145 mmol/L 142  137  137   Potassium 3.5 - 5.1 mmol/L 4.3  4.3  4.1   Chloride 98 - 111 mmol/L 101  101  104    CO2 22 - 32 mmol/L  28  28   Calcium 8.9 - 10.3 mg/dL  8.8  8.3   Total Protein 6.5 - 8.1 g/dL   6.8   Total Bilirubin 0.3 - 1.2 mg/dL   0.6   Alkaline Phos 38 - 126 U/L   107   AST 15 - 41 U/L   22   ALT 0 - 44 U/L   20     DIAGNOSTIC IMAGING:  I have independently reviewed the relevant imaging and discussed with the patient.   WRAP UP:  All questions were answered. The patient knows to call the clinic with any problems, questions or concerns.  Medical decision making: Moderate  Time spent on visit: I spent 20 minutes counseling the patient face to face. The total time spent in the appointment was 30 minutes and more than 50% was on counseling.  Harriett Rush, PA-C  06/08/22 9:11 AM

## 2022-06-08 ENCOUNTER — Inpatient Hospital Stay (HOSPITAL_BASED_OUTPATIENT_CLINIC_OR_DEPARTMENT_OTHER): Payer: Medicare Other | Admitting: Physician Assistant

## 2022-06-08 VITALS — BP 113/47 | HR 70 | Temp 97.8°F | Resp 16 | Wt 122.4 lb

## 2022-06-08 DIAGNOSIS — D75839 Thrombocytosis, unspecified: Secondary | ICD-10-CM | POA: Diagnosis not present

## 2022-06-08 DIAGNOSIS — D500 Iron deficiency anemia secondary to blood loss (chronic): Secondary | ICD-10-CM

## 2022-06-08 DIAGNOSIS — D72829 Elevated white blood cell count, unspecified: Secondary | ICD-10-CM | POA: Diagnosis not present

## 2022-06-08 DIAGNOSIS — J449 Chronic obstructive pulmonary disease, unspecified: Secondary | ICD-10-CM | POA: Diagnosis not present

## 2022-06-08 DIAGNOSIS — M1990 Unspecified osteoarthritis, unspecified site: Secondary | ICD-10-CM | POA: Diagnosis not present

## 2022-06-08 DIAGNOSIS — D509 Iron deficiency anemia, unspecified: Secondary | ICD-10-CM | POA: Diagnosis not present

## 2022-06-08 DIAGNOSIS — R079 Chest pain, unspecified: Secondary | ICD-10-CM | POA: Diagnosis not present

## 2022-06-08 NOTE — Patient Instructions (Signed)
[facility] **   You were seen today by Tarri Abernethy PA-C for your follow-up visit.    ANEMIA Your blood and iron levels remain low.  This is concerning for ongoing blood loss.  Is very important that you follow-up with your GI doctor (Dr. Abbey Chatters) scheduled for endoscopy and colonoscopy to see if you have any bleeding in your stomach or intestines. We will schedule you for IV iron x 4 doses. We will check labs in 1 month and again in 2 months. I will see you for follow-up visit in 2 months.   ** Thank you for trusting me with your healthcare!  I strive to provide all of my patients with quality care at each visit.  If you receive a survey for this visit, I would be so grateful to you for taking the time to provide feedback.  Thank you in advance!  ~ Iwao Shamblin                   Dr. Derek Jack   &   Tarri Abernethy, PA-C   - - - - - - - - - - - - - - - - - -    Thank you for choosing [facility] to provide your oncology and hematology care.  To afford each patient quality time with our provider, please arrive at least 15 minutes before your scheduled appointment time.   If you have a lab appointment with the [facility] please come in thru the Main Entrance and check in at the main information desk.  You need to re-schedule your appointment should you arrive 10 or more minutes late.  We strive to give you quality time with our providers, and arriving late affects you and other patients whose appointments are after yours.  Also, if you no show three or more times for appointments you may be dismissed from the clinic at the providers discretion.     Again, thank you for choosing [facility].  Our hope is that these requests will decrease the amount of time that you wait before being seen by our physicians.        _____________________________________________________________  Should you have questions after your visit to [facility], please contact our office at [phone] and follow the prompts.  Our office hours are 8:00 a.m. and 4:30 p.m. Monday - Friday.  Please note that voicemails left after 4:00 p.m. may not be returned until the following business day.  We are closed weekends and major holidays.  You do have access to a nurse 24-7, just call the main number to the clinic [phone] and do not press any options, hold on the line and a nurse will answer the phone.    For prescription refill requests, have your pharmacy contact our office and allow 72 hours.

## 2022-06-11 ENCOUNTER — Other Ambulatory Visit: Payer: Self-pay

## 2022-06-11 DIAGNOSIS — D500 Iron deficiency anemia secondary to blood loss (chronic): Secondary | ICD-10-CM

## 2022-06-11 DIAGNOSIS — D75839 Thrombocytosis, unspecified: Secondary | ICD-10-CM

## 2022-06-11 DIAGNOSIS — D72829 Elevated white blood cell count, unspecified: Secondary | ICD-10-CM

## 2022-06-11 DIAGNOSIS — D509 Iron deficiency anemia, unspecified: Secondary | ICD-10-CM

## 2022-06-11 DIAGNOSIS — D649 Anemia, unspecified: Secondary | ICD-10-CM

## 2022-06-13 ENCOUNTER — Telehealth: Payer: Self-pay | Admitting: *Deleted

## 2022-06-13 ENCOUNTER — Ambulatory Visit (INDEPENDENT_AMBULATORY_CARE_PROVIDER_SITE_OTHER): Payer: Medicare Other | Admitting: Gastroenterology

## 2022-06-13 ENCOUNTER — Encounter: Payer: Self-pay | Admitting: Gastroenterology

## 2022-06-13 VITALS — BP 112/68 | HR 93 | Temp 97.5°F | Ht 64.5 in | Wt 123.2 lb

## 2022-06-13 DIAGNOSIS — D509 Iron deficiency anemia, unspecified: Secondary | ICD-10-CM | POA: Diagnosis not present

## 2022-06-13 DIAGNOSIS — R195 Other fecal abnormalities: Secondary | ICD-10-CM | POA: Diagnosis not present

## 2022-06-13 DIAGNOSIS — D125 Benign neoplasm of sigmoid colon: Secondary | ICD-10-CM | POA: Diagnosis not present

## 2022-06-13 NOTE — Telephone Encounter (Signed)
  Request for patient to stop medication prior to procedure or is needing cleareance  06/13/22  Desiree Barber [birth year]  What type of surgery is being performed? Colonoscopy/Esophagogastroduodenoscopy  When is surgery scheduled? TBD  What type of clearance is required (medical or pharmacy to hold medication or both? Hold medicaiton  Are there any medications that need to be held prior to surgery and how long? Plavix x 5 days   Name of physician performing surgery?  [facility] [employer]: [phone] Fax: [phone]  Anethesia type (none, local, MAC, general)? MAC

## 2022-06-13 NOTE — Patient Instructions (Signed)
We will get you rescheduled for an upper endoscopy and colonoscopy in the near future with Dr. Abbey Chatters.  We will be able to work around your iron infusions.  Continue to avoid any BC, [place]y powders, ibuprofen, Aleve, Advil.  Continue pantoprazole 40 mg once daily.  Follow-up will be determined by Dr. Abbey Chatters after your procedure.  It was a pleasure to see you today. I want to create trusting relationships with patients. If you receive a survey regarding your visit,  I greatly appreciate you taking time to fill this out on paper or through your MyChart. I value your feedback.  Venetia Night, MSN, FNP-BC, AGACNP-BC [facility]

## 2022-06-13 NOTE — Progress Notes (Signed)
GI Office Note    Referring Provider: Sharilyn Sites, MD Primary Care Physician:  Sharilyn Sites, MD Primary Gastroenterologist: Elon Alas. Abbey Chatters, DO  Date:  06/13/2022  ID:  Desiree Barber, DOB [birth year], MRN [id number]   Chief Complaint   Chief Complaint  Patient presents with   Follow-up    Pt here to reschedule her colonoscopy    History of Present Illness  Desiree Barber is a 72 y.o. female with a history of anemia, anxiety, COPD, HTN, IDA presenting today to reschedule colonoscopy.  Colonoscopy in March 2017: -1 tubular adenoma removed -Pancolonic diverticulosis -Advised repeat in 7 years.  Last office visit 02/28/2022.  Patient referred for IDA.  Denied any stools.  Had recently stopped taking Goody powders which she felt like may be caused her bleeding.  Referral received from hematology oncology.Marland Kitchen  She was started on iron supplementation in November.  She denies any melena or hematochezia.  Records were heme positive.  Proper use 2 months prior.  Denied any NSAIDs history for chest pain, dysphagia, family history of colorectal cancer.  She is scheduled for EGD and colonoscopy for evaluation of her anemia.  On iron supplement.  Patient had to reschedule procedures given vascular procedures performed.  She underwent peripheral vascular catheterization 04/10/2022.  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   Labs 05/25/2022: Hemoglobin 9.3 down from 12.2 on day of catheterization.  Normocytic indices.  Platelets 464.  Ferritin 16, iron 17, iron saturation 4%  Today: Repots at her follow up she has 92% flow abut reports it did not seem to help her fatigue in her legs.   Reports she previously had blood in her stools and was taken off goody powders. Now she reports she is taking Plavix. Reports she does  not feel bad. Stopped taking the oral iron and is about to get an iron infusion. Will be getting them weekly for 3-4 weeks.   No melena, brbrpr. Reports she has had low iron before and states that occurred a lot during childbirth times. Energy level is okay. Has good appetite since she quit smoking  has gained about 25 lbs. And now is maintaining.   Quit smoking one year ago.  Stopped her Crestor a few months ago. PCP told her she no longer needed it.    Current Outpatient Medications  Medication Sig Dispense Refill   albuterol (VENTOLIN HFA) 108 (90 Base) MCG/ACT inhaler Inhale into the lungs.     ALPRAZolam (XANAX) 0.25 MG tablet Take 0.25 mg by mouth daily as needed for anxiety or sleep.     Ascorbic Acid (VITAMIN C) 100 MG tablet Take 100 mg by mouth daily.     aspirin EC 81 MG tablet Take 1 tablet (81 mg total) by mouth daily. Swallow whole. 150 tablet 2   clopidogrel (PLAVIX) 75 MG tablet Take 1 tablet (75 mg total) by mouth daily. 30 tablet 11   losartan (COZAAR) 25 MG tablet Take 1 tablet (25 mg total) by mouth daily. 90 tablet 2   pantoprazole (PROTONIX) 40 MG tablet Take 1 tablet (40 mg total) by mouth daily. 30 tablet 11   TRELEGY ELLIPTA 100-62.5-25 MCG/ACT AEPB Inhale 1 puff into the lungs daily.     umeclidinium-vilanterol (ANORO ELLIPTA) 62.5-25 MCG/ACT AEPB Inhale 1 puff into the lungs daily.  1 each 11   vitamin B-12 (CYANOCOBALAMIN) 100 MCG tablet Take 100 mcg by mouth daily.     VITAMIN D, CHOLECALCIFEROL, PO Take by mouth. One daily     No current facility-administered medications for this visit.    Past Medical History:  Diagnosis Date   Anemia    Anxiety    Occasional   Arthritis    COPD (chronic obstructive pulmonary disease) (HCC)    HTN (hypertension)    Iron deficiency anemia due to chronic blood loss 03/28/2022    Past Surgical History:  Procedure Laterality Date   ABDOMINAL AORTOGRAM W/LOWER EXTREMITY N/A 04/10/2022   Procedure: ABDOMINAL AORTOGRAM  W/LOWER EXTREMITY;  Surgeon: Serafina Mitchell, MD;  Location: [facility];  Service: Cardiovascular;  Laterality: N/A;   COLONOSCOPY  02/23/04   RMR: normal rectum and colon.minimal internal hemorrhoids   COLONOSCOPY WITH PROPOFOL N/A 06/13/2015   Procedure: COLONOSCOPY WITH PROPOFOL;  Surgeon: Daneil Dolin, MD;  [facility];  Service: Endoscopy;  Laterality: N/A;  1045   POLYPECTOMY  06/13/2015   Procedure: POLYPECTOMY;  Surgeon: Daneil Dolin, MD;  Location: [facility];  Service: Endoscopy;;  sigmoid colon polyp   TOOTH EXTRACTION     TRIGGER FINGER RELEASE Bilateral    TUBAL LIGATION      Family History  Problem Relation Age of Onset   Colon cancer Neg Hx    Breast cancer Neg Hx     Allergies as of 06/13/2022 - Review Complete 06/13/2022  Allergen Reaction Noted   Demerol [meperidine] Other (See Comments) 05/03/2015   Oxycontin [oxycodone hcl] Other (See Comments) 05/03/2015   Fish allergy Nausea Only 06/13/2015   Lavender oil  02/28/2022   Other  02/28/2022    Social History   Socioeconomic History   Marital status: Married    Spouse name: Not on file   Number of children: Not on file   Years of education: Not on file   Highest education level: Not on file  Occupational History   Not on file  Tobacco Use   Smoking status: Former    Packs/day: 0.50    Years: 55.00    Additional pack years: 0.00    Total pack years: 27.50    Types: Cigarettes    Quit date: 05/29/2021    Years since quitting: 1.0    Passive exposure: Past   Smokeless tobacco: Former   Tobacco comments:    Quit date 05/29/2021  Vaping Use   Vaping Use: Never used  Substance and Sexual Activity   Alcohol use: Yes    Alcohol/week: 0.0 standard drinks of alcohol    Comment: 3 beers a day   Drug use: No   Sexual activity: Yes    Birth control/protection: Surgical  Other Topics Concern   Not on file  Social History Narrative   Not on file   Social Determinants of Health    Financial Resource Strain: Not on file  Food Insecurity: Unknown (12/24/2021)   Hunger Vital Sign    Worried About Running Out of Food in the Last Year: Never true    Ran Out of Food in the Last Year: Not on file  Transportation Needs: No Transportation Needs (12/24/2021)   PRAPARE - Hydrologist (Medical): No    Lack of Transportation (Non-Medical): No  Physical Activity: Not on file  Stress: Not on file  Social Connections: Not on file     Review  of Systems   Gen: Denies fever, chills, anorexia. Denies fatigue, weakness, weight loss.  CV: Denies chest pain, palpitations, syncope, peripheral edema, and claudication. Resp: +sinus drainage. Denies dyspnea at rest, cough, wheezing, coughing up blood, and pleurisy. GI: See HPI Derm: Denies rash, itching, dry skin Psych: Denies depression, anxiety, memory loss, confusion. No homicidal or suicidal ideation.  Heme: Denies bruising, bleeding, and enlarged lymph nodes.   Physical Exam   BP 112/68   Pulse 93   Temp (!) 97.5 F (36.4 C)   Ht 5' 4.5" (1.638 m)   Wt 123 lb 3.2 oz (55.9 kg)   BMI 20.82 kg/m   General:   Alert and oriented. No distress noted. Pleasant and cooperative.  Head:  Normocephalic and atraumatic. Eyes:  Conjuctiva clear without scleral icterus. Mouth:  Oral mucosa pink and moist. Good dentition. No lesions. Lungs:  Clear to auscultation bilaterally. No wheezes, rales, or rhonchi. No distress.  Heart:  S1, S2 present without murmurs appreciated.  Abdomen:  +BS, soft, non-tender and non-distended. No rebound or guarding. No HSM or masses noted. Rectal: deferred Msk:  Symmetrical without gross deformities. Normal posture. Extremities:  Without edema. Neurologic:  Alert and  oriented x4 Psych:  Alert and cooperative. Normal mood and affect.   Assessment  Desiree Barber is a 72 y.o. female with a history of anemia, anxiety, COPD, HTN, IDA presenting today to reschedule  colonoscopy.  Iron deficiency anemia, previously heme positive stool, history of colon polyps: History of chronic Goody powder use but quit using in October 2023.  Denies any melena, hematochezia, lack of appetite, early satiety, dysphagia, odynophagia, constipation, diarrhea, abdominal pain.  She recently stopped taking oral iron and is planned to have 3-4 iron infusions weekly for the next few weeks.  Her first infusion is scheduled for this Friday 3/22.  Overall her energy level is okay.  She is maintained on pantoprazole 40 mg once daily which we will continue.  Will proceed with previously planned EGD and colonoscopy for further evaluation of anemia and assess for esophagitis, gastritis, duodenitis, H. pylori, peptic ulcer disease, AVMs, polyps, etc.  She was put on after having bilateral iliac stents placed therefore we will need to request clearance from vascular to hold Plavix for 5 days.  Patient would prefer a low volume prep and she is willing to pay the difference out-of-pocket for this.  PLAN   Proceed with upper endoscopy and colonoscopy with propofol by Dr. Abbey Chatters in near future: the risks, benefits, and alternatives have been discussed with the patient in detail. The patient states understanding and desires to proceed. ASA 3 Clearance from vascular to hold Plavix for 5 days. (Dr. Donnetta Hutching) Low volume prep -okay for Suprep or Clenpiq. Continue pantoprazole 40 mg once daily. Follow-up after procedures as per Dr. Abbey Chatters.   Venetia Night, MSN, FNP-BC, AGACNP-BC [facility]

## 2022-06-15 ENCOUNTER — Inpatient Hospital Stay: Payer: Medicare Other

## 2022-06-15 VITALS — BP 122/53 | HR 81 | Temp 97.8°F | Resp 18

## 2022-06-15 DIAGNOSIS — D500 Iron deficiency anemia secondary to blood loss (chronic): Secondary | ICD-10-CM

## 2022-06-15 DIAGNOSIS — D72829 Elevated white blood cell count, unspecified: Secondary | ICD-10-CM | POA: Diagnosis not present

## 2022-06-15 DIAGNOSIS — J449 Chronic obstructive pulmonary disease, unspecified: Secondary | ICD-10-CM | POA: Diagnosis not present

## 2022-06-15 DIAGNOSIS — D509 Iron deficiency anemia, unspecified: Secondary | ICD-10-CM | POA: Diagnosis not present

## 2022-06-15 DIAGNOSIS — R079 Chest pain, unspecified: Secondary | ICD-10-CM | POA: Diagnosis not present

## 2022-06-15 DIAGNOSIS — D75839 Thrombocytosis, unspecified: Secondary | ICD-10-CM | POA: Diagnosis not present

## 2022-06-15 DIAGNOSIS — M1990 Unspecified osteoarthritis, unspecified site: Secondary | ICD-10-CM | POA: Diagnosis not present

## 2022-06-15 MED ORDER — ACETAMINOPHEN 325 MG PO TABS
650.0000 mg | ORAL_TABLET | Freq: Once | ORAL | Status: AC
  Administered 2022-06-15: 650 mg via ORAL
  Filled 2022-06-15: qty 2

## 2022-06-15 MED ORDER — CETIRIZINE HCL 10 MG PO TABS
10.0000 mg | ORAL_TABLET | Freq: Once | ORAL | Status: AC
  Administered 2022-06-15: 10 mg via ORAL
  Filled 2022-06-15: qty 1

## 2022-06-15 MED ORDER — SODIUM CHLORIDE 0.9 % IV SOLN
300.0000 mg | Freq: Once | INTRAVENOUS | Status: AC
  Administered 2022-06-15: 300 mg via INTRAVENOUS
  Filled 2022-06-15: qty 300

## 2022-06-15 MED ORDER — SODIUM CHLORIDE 0.9 % IV SOLN: Freq: Once | INTRAVENOUS | Status: AC

## 2022-06-15 MED ORDER — LORATADINE 10 MG PO TABS: 10.0000 mg | ORAL_TABLET | Freq: Once | ORAL | Status: DC

## 2022-06-15 NOTE — Progress Notes (Signed)
Patient tolerated iron infusion with no complaints voiced.  Peripheral IV site clean and dry with good blood return noted before and after infusion.  Band aid applied.  VSS with discharge and left in satisfactory condition with no s/s of distress noted.   

## 2022-06-15 NOTE — Patient Instructions (Signed)
[facility] [place]  Discharge Instructions: Thank you for choosing [facility] to provide your oncology and hematology care.  If you have a lab appointment with the Cancer Center, please come in thru the Main Entrance and check in at the main information desk.  Wear comfortable clothing and clothing appropriate for easy access to any Portacath or PICC line.   We strive to give you quality time with your provider. You may need to reschedule your appointment if you arrive late (15 or more minutes).  Arriving late affects you and other patients whose appointments are after yours.  Also, if you miss three or more appointments without notifying the office, you may be dismissed from the clinic at the provider's discretion.      For prescription refill requests, have your pharmacy contact our office and allow 72 hours for refills to be completed.    Today you received the following Venofer, return as scheduled.   To help prevent nausea and vomiting after your treatment, we encourage you to take your nausea medication as directed.  BELOW ARE SYMPTOMS THAT SHOULD BE REPORTED IMMEDIATELY: *FEVER GREATER THAN 100.4 F (38 C) OR HIGHER *CHILLS OR SWEATING *NAUSEA AND VOMITING THAT IS NOT CONTROLLED WITH YOUR NAUSEA MEDICATION *UNUSUAL SHORTNESS OF BREATH *UNUSUAL BRUISING OR BLEEDING *URINARY PROBLEMS (pain or burning when urinating, or frequent urination) *BOWEL PROBLEMS (unusual diarrhea, constipation, pain near the anus) TENDERNESS IN MOUTH AND THROAT WITH OR WITHOUT PRESENCE OF ULCERS (sore throat, sores in mouth, or a toothache) UNUSUAL RASH, SWELLING OR PAIN  UNUSUAL VAGINAL DISCHARGE OR ITCHING   Items with * indicate a potential emergency and should be followed up as soon as possible or go to the Emergency Department if any problems should occur.  Please show the CHEMOTHERAPY ALERT CARD or IMMUNOTHERAPY ALERT CARD at check-in to the Emergency Department and triage  nurse.  Should you have questions after your visit or need to cancel or reschedule your appointment, please contact [facility] [place] [phone]  and follow the prompts.  Office hours are 8:00 a.m. to 4:30 p.m. Monday - Friday. Please note that voicemails left after 4:00 p.m. may not be returned until the following business day.  We are closed weekends and major holidays. You have access to a nurse at all times for urgent questions. Please call the main number to the clinic [phone] and follow the prompts.  For any non-urgent questions, you may also contact your provider using MyChart. We now offer e-Visits for anyone 18 and older to request care online for non-urgent symptoms. For details visit [email or web address].   Also download the MyChart app! Go to the app store, search "MyChart", open the app, select [place], and log in with your MyChart username and password.   

## 2022-06-21 MED FILL — Iron Sucrose Inj 20 MG/ML (Fe Equiv): INTRAVENOUS | Qty: 15 | Status: AC

## 2022-06-22 ENCOUNTER — Inpatient Hospital Stay: Payer: Medicare Other

## 2022-06-22 VITALS — BP 122/57 | HR 76 | Temp 98.7°F | Resp 18

## 2022-06-22 DIAGNOSIS — D509 Iron deficiency anemia, unspecified: Secondary | ICD-10-CM | POA: Diagnosis not present

## 2022-06-22 DIAGNOSIS — M1990 Unspecified osteoarthritis, unspecified site: Secondary | ICD-10-CM | POA: Diagnosis not present

## 2022-06-22 DIAGNOSIS — D72829 Elevated white blood cell count, unspecified: Secondary | ICD-10-CM | POA: Diagnosis not present

## 2022-06-22 DIAGNOSIS — D500 Iron deficiency anemia secondary to blood loss (chronic): Secondary | ICD-10-CM

## 2022-06-22 DIAGNOSIS — R079 Chest pain, unspecified: Secondary | ICD-10-CM | POA: Diagnosis not present

## 2022-06-22 DIAGNOSIS — J449 Chronic obstructive pulmonary disease, unspecified: Secondary | ICD-10-CM | POA: Diagnosis not present

## 2022-06-22 DIAGNOSIS — D75839 Thrombocytosis, unspecified: Secondary | ICD-10-CM | POA: Diagnosis not present

## 2022-06-22 MED ORDER — SODIUM CHLORIDE 0.9 % IV SOLN: Freq: Once | INTRAVENOUS | Status: AC

## 2022-06-22 MED ORDER — SODIUM CHLORIDE 0.9 % IV SOLN
300.0000 mg | Freq: Once | INTRAVENOUS | Status: AC
  Administered 2022-06-22: 300 mg via INTRAVENOUS
  Filled 2022-06-22: qty 300

## 2022-06-22 NOTE — Progress Notes (Signed)
Patient presents today for Venofer 300 mg iron infusion. MAR reviewed and updated. Vital signs stable . Patient has no complaints today. Patient denies any side effects related to last iron infusion.   Venofer given today per MD orders. Tolerated infusion without adverse affects. Vital signs stable. No complaints at this time. Discharged from clinic ambulatory in stable condition. Alert and oriented x 3. F/U with [facility] as scheduled.

## 2022-06-22 NOTE — Patient Instructions (Signed)
[facility] [place]  Discharge Instructions: Thank you for choosing [facility] to provide your oncology and hematology care.  If you have a lab appointment with the Cancer Center, please come in thru the Main Entrance and check in at the main information desk.  Wear comfortable clothing and clothing appropriate for easy access to any Portacath or PICC line.   We strive to give you quality time with your provider. You may need to reschedule your appointment if you arrive late (15 or more minutes).  Arriving late affects you and other patients whose appointments are after yours.  Also, if you miss three or more appointments without notifying the office, you may be dismissed from the clinic at the provider's discretion.      For prescription refill requests, have your pharmacy contact our office and allow 72 hours for refills to be completed.    Today you received the following chemotherapy and/or immunotherapy agents Venofer 300 mg  Iron Sucrose Injection What is this medication? IRON SUCROSE (EYE ern SOO krose) treats low levels of iron (iron deficiency anemia) in people with kidney disease. Iron is a mineral that plays an important role in making red blood cells, which carry oxygen from your lungs to the rest of your body. This medicine may be used for other purposes; ask your health care provider or pharmacist if you have questions. COMMON BRAND NAME(S): Venofer What should I tell my care team before I take this medication? They need to know if you have any of these conditions: Anemia not caused by low iron levels Heart disease High levels of iron in the blood Kidney disease Liver disease An unusual or allergic reaction to iron, other medications, foods, dyes, or preservatives Pregnant or trying to get pregnant Breastfeeding How should I use this medication? This medication is for infusion into a vein. It is given in a hospital or clinic setting. Talk to your  care team about the use of this medication in children. While this medication may be prescribed for children as young as 2 years for selected conditions, precautions do apply. Overdosage: If you think you have taken too much of this medicine contact a poison control center or emergency room at once. NOTE: This medicine is only for you. Do not share this medicine with others. What if I miss a dose? Keep appointments for follow-up doses. It is important not to miss your dose. Call your care team if you are unable to keep an appointment. What may interact with this medication? Do not take this medication with any of the following: Deferoxamine Dimercaprol Other iron products This medication may also interact with the following: Chloramphenicol Deferasirox This list may not describe all possible interactions. Give your health care provider a list of all the medicines, herbs, non-prescription drugs, or dietary supplements you use. Also tell them if you smoke, drink alcohol, or use illegal drugs. Some items may interact with your medicine. What should I watch for while using this medication? Visit your care team regularly. Tell your care team if your symptoms do not start to get better or if they get worse. You may need blood work done while you are taking this medication. You may need to follow a special diet. Talk to your care team. Foods that contain iron include: whole grains/cereals, dried fruits, beans, or peas, leafy green vegetables, and organ meats (liver, kidney). What side effects may I notice from receiving this medication? Side effects that you should report   to your care team as soon as possible: Allergic reactions--skin rash, itching, hives, swelling of the face, lips, tongue, or throat Low blood pressure--dizziness, feeling faint or lightheaded, blurry vision Shortness of breath Side effects that usually do not require medical attention (report to your care team if they continue or are  bothersome): Flushing Headache Joint pain Muscle pain Nausea Pain, redness, or irritation at injection site This list may not describe all possible side effects. Call your doctor for medical advice about side effects. You may report side effects to FDA at 1-800-FDA-1088. Where should I keep my medication? This medication is given in a hospital or clinic and will not be stored at home. NOTE: This sheet is a summary. It may not cover all possible information. If you have questions about this medicine, talk to your doctor, pharmacist, or health care provider.  2023 Elsevier/Gold Standard (2020-06-23 00:00:00)       To help prevent nausea and vomiting after your treatment, we encourage you to take your nausea medication as directed.  BELOW ARE SYMPTOMS THAT SHOULD BE REPORTED IMMEDIATELY: *FEVER GREATER THAN 100.4 F (38 C) OR HIGHER *CHILLS OR SWEATING *NAUSEA AND VOMITING THAT IS NOT CONTROLLED WITH YOUR NAUSEA MEDICATION *UNUSUAL SHORTNESS OF BREATH *UNUSUAL BRUISING OR BLEEDING *URINARY PROBLEMS (pain or burning when urinating, or frequent urination) *BOWEL PROBLEMS (unusual diarrhea, constipation, pain near the anus) TENDERNESS IN MOUTH AND THROAT WITH OR WITHOUT PRESENCE OF ULCERS (sore throat, sores in mouth, or a toothache) UNUSUAL RASH, SWELLING OR PAIN  UNUSUAL VAGINAL DISCHARGE OR ITCHING   Items with * indicate a potential emergency and should be followed up as soon as possible or go to the Emergency Department if any problems should occur.  Please show the CHEMOTHERAPY ALERT CARD or IMMUNOTHERAPY ALERT CARD at check-in to the Emergency Department and triage nurse.  Should you have questions after your visit or need to cancel or reschedule your appointment, please contact [facility] [place] [phone]  and follow the prompts.  Office hours are 8:00 a.m. to 4:30 p.m. Monday - Friday. Please note that voicemails left after 4:00 p.m. may not be returned until  the following business day.  We are closed weekends and major holidays. You have access to a nurse at all times for urgent questions. Please call the main number to the clinic [phone] and follow the prompts.  For any non-urgent questions, you may also contact your provider using MyChart. We now offer e-Visits for anyone 18 and older to request care online for non-urgent symptoms. For details visit [email or web address].   Also download the MyChart app! Go to the app store, search "MyChart", open the app, select [place], and log in with your MyChart username and password.   

## 2022-06-28 MED FILL — Iron Sucrose Inj 20 MG/ML (Fe Equiv): INTRAVENOUS | Qty: 15 | Status: AC

## 2022-06-29 ENCOUNTER — Inpatient Hospital Stay: Payer: Medicare Other | Attending: Hematology

## 2022-06-29 VITALS — BP 128/50 | HR 71 | Temp 97.3°F | Resp 16

## 2022-06-29 DIAGNOSIS — R0602 Shortness of breath: Secondary | ICD-10-CM | POA: Diagnosis not present

## 2022-06-29 DIAGNOSIS — K921 Melena: Secondary | ICD-10-CM | POA: Insufficient documentation

## 2022-06-29 DIAGNOSIS — Z79899 Other long term (current) drug therapy: Secondary | ICD-10-CM | POA: Diagnosis not present

## 2022-06-29 DIAGNOSIS — Z87891 Personal history of nicotine dependence: Secondary | ICD-10-CM | POA: Diagnosis not present

## 2022-06-29 DIAGNOSIS — D75839 Thrombocytosis, unspecified: Secondary | ICD-10-CM | POA: Diagnosis not present

## 2022-06-29 DIAGNOSIS — R002 Palpitations: Secondary | ICD-10-CM | POA: Insufficient documentation

## 2022-06-29 DIAGNOSIS — D500 Iron deficiency anemia secondary to blood loss (chronic): Secondary | ICD-10-CM

## 2022-06-29 DIAGNOSIS — D72829 Elevated white blood cell count, unspecified: Secondary | ICD-10-CM | POA: Diagnosis not present

## 2022-06-29 DIAGNOSIS — R200 Anesthesia of skin: Secondary | ICD-10-CM | POA: Diagnosis not present

## 2022-06-29 MED ORDER — SODIUM CHLORIDE 0.9 % IV SOLN: Freq: Once | INTRAVENOUS | Status: AC

## 2022-06-29 MED ORDER — SODIUM CHLORIDE 0.9 % IV SOLN
300.0000 mg | Freq: Once | INTRAVENOUS | Status: AC
  Administered 2022-06-29: 300 mg via INTRAVENOUS
  Filled 2022-06-29: qty 300

## 2022-06-29 NOTE — Progress Notes (Signed)
Patient reports taking premedications at home. Patient tolerated iron infusion with no complaints voiced. Peripheral IV site clean and dry with good blood return noted before and after infusion. Band aid applied. VSS with discharge and left in satisfactory condition with no s/s of distress noted.

## 2022-06-29 NOTE — Patient Instructions (Signed)
[facility]  Discharge Instructions: Thank you for choosing [facility] to provide your oncology and hematology care.  If you have a lab appointment with the Cancer Center - please note that after April 8th, 2024, all labs will be drawn in the cancer center.  You do not have to check in or register with the main entrance as you have in the past but will complete your check-in in the cancer center.  Wear comfortable clothing and clothing appropriate for easy access to any Portacath or PICC line.   We strive to give you quality time with your provider. You may need to reschedule your appointment if you arrive late (15 or more minutes).  Arriving late affects you and other patients whose appointments are after yours.  Also, if you miss three or more appointments without notifying the office, you may be dismissed from the clinic at the provider's discretion.      For prescription refill requests, have your pharmacy contact our office and allow 72 hours for refills to be completed.    Today you received the following Venofer, return as scheduled.   To help prevent nausea and vomiting after your treatment, we encourage you to take your nausea medication as directed.  BELOW ARE SYMPTOMS THAT SHOULD BE REPORTED IMMEDIATELY: *FEVER GREATER THAN 100.4 F (38 C) OR HIGHER *CHILLS OR SWEATING *NAUSEA AND VOMITING THAT IS NOT CONTROLLED WITH YOUR NAUSEA MEDICATION *UNUSUAL SHORTNESS OF BREATH *UNUSUAL BRUISING OR BLEEDING *URINARY PROBLEMS (pain or burning when urinating, or frequent urination) *BOWEL PROBLEMS (unusual diarrhea, constipation, pain near the anus) TENDERNESS IN MOUTH AND THROAT WITH OR WITHOUT PRESENCE OF ULCERS (sore throat, sores in mouth, or a toothache) UNUSUAL RASH, SWELLING OR PAIN  UNUSUAL VAGINAL DISCHARGE OR ITCHING   Items with * indicate a potential emergency and should be followed up as soon as possible or go to the Emergency Department if  any problems should occur.  Please show the CHEMOTHERAPY ALERT CARD or IMMUNOTHERAPY ALERT CARD at check-in to the Emergency Department and triage nurse.  Should you have questions after your visit or need to cancel or reschedule your appointment, please contact [facility] [phone]  and follow the prompts.  Office hours are 8:00 a.m. to 4:30 p.m. Monday - Friday. Please note that voicemails left after 4:00 p.m. may not be returned until the following business day.  We are closed weekends and major holidays. You have access to a nurse at all times for urgent questions. Please call the main number to the clinic [phone] and follow the prompts.  For any non-urgent questions, you may also contact your provider using MyChart. We now offer e-Visits for anyone 42 and older to request care online for non-urgent symptoms. For details visit [email or web address].   Also download the MyChart app! Go to the app store, search "MyChart", open the app, select [place], and log in with your MyChart username and password.

## 2022-07-06 ENCOUNTER — Inpatient Hospital Stay: Payer: Medicare Other

## 2022-07-06 NOTE — Telephone Encounter (Signed)
1st attempt to reach pt. Phone rang continuously and no vm came on.

## 2022-07-06 NOTE — Telephone Encounter (Signed)
   Name: Desiree Barber  DOB: 19-Jan-1951  MRN: [id number]  Primary Cardiologist: None   Preoperative team, please contact this patient and set up a phone call appointment for further preoperative risk assessment. Please obtain consent and complete medication review. Thank you for your help.  I confirm that guidance regarding antiplatelet and oral anticoagulation therapy has been completed and, if necessary, noted below.  Per office protocol, he may hold Plavix for 5 days prior to procedure and should resume as soon as hemodynamically stable postoperatively.    Carlos Levering, NP 07/06/2022, 12:54 PM [facility]

## 2022-07-09 ENCOUNTER — Telehealth: Payer: Self-pay

## 2022-07-09 ENCOUNTER — Other Ambulatory Visit: Payer: Medicare Other

## 2022-07-09 NOTE — Telephone Encounter (Signed)
Patient scheduled for tele visit on 07/16/22. Med rec and consent done

## 2022-07-09 NOTE — Telephone Encounter (Signed)
  Patient Consent for Virtual Visit         Desiree Barber has provided verbal consent on 07/09/2022 for a virtual visit (video or telephone).   CONSENT FOR VIRTUAL VISIT FOR:  Desiree Barber  By participating in this virtual visit I agree to the following:  I hereby voluntarily request, consent and authorize [facility] and its employed or contracted physicians, physician assistants, nurse practitioners or other licensed health care professionals (the Practitioner), to provide me with telemedicine health care services (the "Services") as deemed necessary by the treating Practitioner. I acknowledge and consent to receive the Services by the Practitioner via telemedicine. I understand that the telemedicine visit will involve communicating with the Practitioner through live audiovisual communication technology and the disclosure of certain medical information by electronic transmission. I acknowledge that I have been given the opportunity to request an in-person assessment or other available alternative prior to the telemedicine visit and am voluntarily participating in the telemedicine visit.  I understand that I have the right to withhold or withdraw my consent to the use of telemedicine in the course of my care at any time, without affecting my right to future care or treatment, and that the Practitioner or I may terminate the telemedicine visit at any time. I understand that I have the right to inspect all information obtained and/or recorded in the course of the telemedicine visit and may receive copies of available information for a reasonable fee.  I understand that some of the potential risks of receiving the Services via telemedicine include:  Delay or interruption in medical evaluation due to technological equipment failure or disruption; Information transmitted may not be sufficient (e.g. poor resolution of images) to allow for appropriate medical decision making by the  Practitioner; and/or  In rare instances, security protocols could fail, causing a breach of personal health information.  Furthermore, I acknowledge that it is my responsibility to provide information about my medical history, conditions and care that is complete and accurate to the best of my ability. I acknowledge that Practitioner's advice, recommendations, and/or decision may be based on factors not within their control, such as incomplete or inaccurate data provided by me or distortions of diagnostic images or specimens that may result from electronic transmissions. I understand that the practice of medicine is not an exact science and that Practitioner makes no warranties or guarantees regarding treatment outcomes. I acknowledge that a copy of this consent can be made available to me via my patient portal [facility] MyChart), or I can request a printed copy by calling the office of [facility].    I understand that my insurance will be billed for this visit.   I have read or had this consent read to me. I understand the contents of this consent, which adequately explains the benefits and risks of the Services being provided via telemedicine.  I have been provided ample opportunity to ask questions regarding this consent and the Services and have had my questions answered to my satisfaction. I give my informed consent for the services to be provided through the use of telemedicine in my medical care

## 2022-07-09 NOTE — Progress Notes (Signed)
Desiree Barber, female    DOB: 1950/11/16,    MRN: [id number]   Brief patient profile:  72  yowf  quit smoking 05/29/21/MM with doe/cough which improved  referred to pulmonary clinic in [place]  06/13/2021 by Lenise Herald PA for ? 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    History of Present Illness  06/13/2021  Pulmonary/ 1st office eval/ Iyannah Blake / [place] [facility]  Chief Complaint  Patient presents with   Consult    Consult for COPD with hypoxia and need for O2 form [facility]   Dyspnea:  12-15 min walks 1/2 mile some inclines s stopping on incruse x > year. 02 sats now ok p stops but not checking at peak ex  Cough: none  Sleep: well / on side bed is flat  SABA use: none   Rec Congratulations on not smoking -  it's the most important aspect of your care.  Ok to try off incruse to see if it affects your wind /breathing when walking and if worse consider restarting incruse or substituting anoro (the highest octane)  > did fine off incruse   Admit date: 12/23/2021 Discharge date: 12/24/2021 72 y.o. female with medical history significant of anxiety, COPD, hypertension, presents to the ED with a chief complaint of shortness of breath.  Patient reports that she had shortness of breath for the last 10 days.  She has been checking her O2 sats with a pulse ox every day, and today it was down to 78% so she decided she needed to come into the ER.  She reports her shortness of breath has been constant and progressively worsening.  She has had a cough that is nonproductive.  She has heard herself wheezing.  Patient reports she does not have a rescue inhaler at home but she but has been using her twice daily inhaler.  Patient reports that she has not had a fever or felt lightheaded.  She has had palpitations with exertion.  Patient reports she was not exerting herself when she checked her pulse ox today.  She reports she feels much better now than when she came in.  She is confused as to why  her COPD would flare when she quit smoking 7 months ago.  We discussed other triggers of COPD flare including season change and allergies.  Patient has no other complaints at this time.   Patient does not smoke she drinks 1-2 beers daily and has never had withdrawals.  She does not use drugs.  She is vaccinated for COVID and she has had this years flu shot.  She is full code.   Hospital Course by Problem    Assessment and Plan: * Acute respiratory failure with hypoxia (HCC) - Oxygen sats dropped down to 69% with ambulation - Secondary to COPD exacerbation see the respective plan - Continue supplemental O2 --> Pt qualifies for  Home oxygen  - Continue bronchodilators   Leukocytosis White blood cell count 12.5 - steroid induced    Anxiety disorder - Continue Xanax   COPD with acute exacerbation (HCC) - Patient desaturated to 69% with ambulation. - Normalized on 2 L nasal cannula - Continue Incruse and Dulera - Pt was treated with scheduled and as needed breathing treatments - treated with IV steroid   Pt requested to discharge home today. She has an Rx filled from her urgent care visit for azithromycin and prednisone taper.  I told her to finish that prescription.  We made  arrangements for home oxygen for her.  She will DC home today.  Outpatient follow up with PCP recommended.    Discharge Diagnoses:  Principal Problem:   Acute respiratory failure with hypoxia (HCC)   COPD with acute exacerbation (HCC)   Anxiety disorder   Leukocytosis     01/05/2022  f/u ov/[place] office/Zaylie Gisler re: copd group E  maint on trelegy   Chief Complaint  Patient presents with   Hospitalization Follow-up    [facility] admission from 9/30-10/1  Feels better since discharge but states her legs are giving out on her more that they used to since leaving the hospital    Dyspnea:  improved/ back shopping  Cough: min rattling  Sleeping: bed is flat/ on side / one pillow  SABA use: no 02: 2lpm  hs and prn  Covid status: x 4  Lung cancer screening: done  Rec Bronchiectasis =   you have scarring of your bronchial tubes which means that they don't function perfectly normally   For cough /congestion > mucinex  1200 mg every 12 hours and use the flutter valve as much as possible to help bring mucus up prior to your next ct to clear out the lingula  Make sure you check your oxygen saturation  AT  your highest level of activity (not after you stop)   My office will be contacting you by phone for referral to [facility] ( [facility] of [facility])      02/14/2022  f/u ov/[place] office/Dhanvin Szeto re: bronchiectasis maint on anoro   Chief Complaint  Patient presents with   Follow-up    Breathing doing better but now has other health concerns to discuss   Dyspnea:  shopping ok / decorating  Cough: none  Sleeping: flat bed/ one pillow SABA use: none  02: not using  Lung cancer screening: in program  Rec Make sure you check your oxygen saturation  AT  your highest level of activity (not after you stop)   to be sure it stays over 90%  No change in medications   Please schedule a follow up visit in 12 months but call sooner if needed    07/10/2022  f/u ov/[place] office/Jatavia Keltner re: copd /bronchiectais maint on anoro   Chief Complaint  Patient presents with   Follow-up    Pt f/u for increased SOB - alternating between Anoro and Trelegy   Dyspnea:  pushing cart foodlion fine / not checking sats with ex  Cough: better now  Sleeping: flat bed/ one pillow  SABA use: not using  02: POC not using      No obvious day to day or daytime variability or assoc excess/ purulent sputum or mucus plugs or hemoptysis or cp or chest tightness, subjective wheeze or overt sinus or hb symptoms.   Sleeping  without nocturnal  or early am exacerbation  of respiratory  c/o's or need for noct saba. Also denies any obvious fluctuation of symptoms with weather or environmental changes or other aggravating or  alleviating factors except as outlined above   No unusual exposure hx or h/o childhood pna/ asthma or knowledge of premature birth.  Current Allergies, Complete Past Medical History, Past Surgical History, Family History, and Social History were reviewed in [employer] record.  ROS  The following are not active complaints unless bolded Hoarseness, sore throat, dysphagia, dental problems, itching, sneezing,  nasal congestion or discharge of excess mucus or purulent secretions, ear ache,   fever, chills, sweats, unintended  wt loss or wt gain, classically pleuritic or exertional cp,  orthopnea pnd or arm/hand swelling  or leg swelling, presyncope, palpitations, abdominal pain, anorexia, nausea, vomiting, diarrhea  or change in bowel habits or change in bladder habits, change in stools or change in urine, dysuria, hematuria,  rash, arthralgias, visual complaints, headache, numbness, weakness or ataxia or problems with walking or coordination,  change in mood or  memory.        Current Meds  Medication Sig   albuterol (VENTOLIN HFA) 108 (90 Base) MCG/ACT inhaler Inhale into the lungs.   ALPRAZolam (XANAX) 0.25 MG tablet Take 0.25 mg by mouth daily as needed for anxiety or sleep.   Ascorbic Acid (VITAMIN C) 100 MG tablet Take 100 mg by mouth daily.   aspirin EC 81 MG tablet Take 1 tablet (81 mg total) by mouth daily. Swallow whole.   clopidogrel (PLAVIX) 75 MG tablet Take 1 tablet (75 mg total) by mouth daily.   losartan (COZAAR) 25 MG tablet Take 1 tablet (25 mg total) by mouth daily.   pantoprazole (PROTONIX) 40 MG tablet Take 1 tablet (40 mg total) by mouth daily.   TRELEGY ELLIPTA 100-62.5-25 MCG/ACT AEPB Inhale 1 puff into the lungs daily.   umeclidinium-vilanterol (ANORO ELLIPTA) 62.5-25 MCG/ACT AEPB Inhale 1 puff into the lungs daily.   vitamin B-12 (CYANOCOBALAMIN) 100 MCG tablet Take 100 mcg by mouth daily.   VITAMIN D, CHOLECALCIFEROL, PO Take by mouth. One daily                 Past Medical History:  Diagnosis Date   Anxiety    Occasional   Arthritis    COPD (chronic obstructive pulmonary disease) (HCC)    HTN (hypertension)        Objective:    Wts  07/10/2022       125  02/14/2022     117   01/05/22 119 lb 6.4 oz (54.2 kg)  01/05/22 119 lb 12.8 oz (54.3 kg)  12/24/21 115 lb 3.2 oz (52.3 kg)    Vital signs reviewed  07/10/2022  - Note at rest 02 sats  99% on RA   General appearance:    elderly amb wf nad   HEENT : Oropharynx  clear    NECK :  without  apparent JVD/ palpable Nodes/TM    LUNGS: no acc muscle use,  Mild barrel  contour chest wall with bilateral  Distant bs s audible wheeze and  without cough on insp or exp maneuvers  and mild  Hyperresonant  to  percussion bilaterally     CV:  RRR  no s3 or murmur or increase in P2, and no edema   ABD:  soft and nontender with pos end  insp Hoover's  in the supine position.  No bruits or organomegaly appreciated   MS:  Nl gait/ ext warm without deformities Or obvious joint restrictions  calf tenderness, cyanosis or clubbing     SKIN: warm and dry without lesions    NEURO:  alert, approp, nl sensorium with  no motor or cerebellar deficits apparent.                 Assessment

## 2022-07-10 ENCOUNTER — Encounter: Payer: Self-pay | Admitting: Internal Medicine

## 2022-07-10 ENCOUNTER — Other Ambulatory Visit: Payer: Self-pay

## 2022-07-10 ENCOUNTER — Ambulatory Visit (INDEPENDENT_AMBULATORY_CARE_PROVIDER_SITE_OTHER): Payer: Medicare Other | Admitting: Internal Medicine

## 2022-07-10 VITALS — BP 112/74 | HR 98 | Ht 64.0 in | Wt 125.4 lb

## 2022-07-10 DIAGNOSIS — J9611 Chronic respiratory failure with hypoxia: Secondary | ICD-10-CM | POA: Diagnosis not present

## 2022-07-10 DIAGNOSIS — J441 Chronic obstructive pulmonary disease with (acute) exacerbation: Secondary | ICD-10-CM

## 2022-07-10 DIAGNOSIS — J449 Chronic obstructive pulmonary disease, unspecified: Secondary | ICD-10-CM | POA: Diagnosis not present

## 2022-07-10 DIAGNOSIS — J479 Bronchiectasis, uncomplicated: Secondary | ICD-10-CM | POA: Diagnosis not present

## 2022-07-10 DIAGNOSIS — D500 Iron deficiency anemia secondary to blood loss (chronic): Secondary | ICD-10-CM

## 2022-07-10 DIAGNOSIS — D72829 Elevated white blood cell count, unspecified: Secondary | ICD-10-CM

## 2022-07-10 MED ORDER — ALBUTEROL SULFATE (2.5 MG/3ML) 0.083% IN NEBU
INHALATION_SOLUTION | RESPIRATORY_TRACT | 12 refills | Status: DC
Start: 2022-07-10 — End: 2023-07-18

## 2022-07-10 NOTE — Patient Instructions (Addendum)
Plan A = Automatic = Always=    Anoro one click each am - tug initially   Plan B = Backup (to supplement plan A, not to replace it) Only use your albuterol inhaler as a rescue medication to be used if you can't catch your breath by resting or doing a relaxed purse lip breathing pattern.  - The less you use it, the better it will work when you need it. - Ok to use the inhaler up to 2 puffs  every 4 hours if you must but call for appointment if use goes up over your usual need - Don't leave home without it !!  (think of it like the spare tire for your car)   Plan C = Crisis (instead of Plan B but only if Plan B stops working) - only use your albuterol nebulizer if you first try Plan B and it fails to help > ok to use the nebulizer up to every 4 hours but if start needing it regularly call for immediate appointment  Keep up with your vaccinations   My office will be contacting you by phone for referral to pulmonary rehab  - if you don't hear back from my office within one week please call us back or notify us thru MyChart and we'll address it right away.   Late add: Make sure you check your oxygen saturation  AT  your highest level of activity (not after you stop)  to  be sure it stays over 90% and adjust  02 flow upward to maintain this level if needed but remember to turn it back to previous settings when you stop (to conserve your supply).   Please schedule a follow up visit in 6 months but call sooner if needed

## 2022-07-11 ENCOUNTER — Inpatient Hospital Stay: Payer: Medicare Other

## 2022-07-11 VITALS — BP 128/57 | HR 70 | Temp 97.2°F | Resp 18

## 2022-07-11 DIAGNOSIS — D75839 Thrombocytosis, unspecified: Secondary | ICD-10-CM | POA: Diagnosis not present

## 2022-07-11 DIAGNOSIS — R002 Palpitations: Secondary | ICD-10-CM | POA: Diagnosis not present

## 2022-07-11 DIAGNOSIS — D649 Anemia, unspecified: Secondary | ICD-10-CM

## 2022-07-11 DIAGNOSIS — D72829 Elevated white blood cell count, unspecified: Secondary | ICD-10-CM

## 2022-07-11 DIAGNOSIS — R0602 Shortness of breath: Secondary | ICD-10-CM | POA: Diagnosis not present

## 2022-07-11 DIAGNOSIS — D509 Iron deficiency anemia, unspecified: Secondary | ICD-10-CM

## 2022-07-11 DIAGNOSIS — K921 Melena: Secondary | ICD-10-CM | POA: Diagnosis not present

## 2022-07-11 DIAGNOSIS — D500 Iron deficiency anemia secondary to blood loss (chronic): Secondary | ICD-10-CM

## 2022-07-11 DIAGNOSIS — J449 Chronic obstructive pulmonary disease, unspecified: Secondary | ICD-10-CM | POA: Insufficient documentation

## 2022-07-11 LAB — RETICULOCYTES
Immature Retic Fract: 28.3 % — ABNORMAL HIGH (ref 2.3–15.9)
RBC.: 3.5 MIL/uL — ABNORMAL LOW (ref 3.87–5.11)
Retic Count, Absolute: 90.7 10*3/uL (ref 19.0–186.0)
Retic Ct Pct: 2.6 % (ref 0.4–3.1)

## 2022-07-11 LAB — LACTATE DEHYDROGENASE: LDH: 144 U/L (ref 98–192)

## 2022-07-11 LAB — CBC
HCT: 34.3 % — ABNORMAL LOW (ref 36.0–46.0)
Hemoglobin: 10.1 g/dL — ABNORMAL LOW (ref 12.0–15.0)
MCH: 29.1 pg (ref 26.0–34.0)
MCHC: 29.4 g/dL — ABNORMAL LOW (ref 30.0–36.0)
MCV: 98.8 fL (ref 80.0–100.0)
Platelets: 401 10*3/uL — ABNORMAL HIGH (ref 150–400)
RBC: 3.47 MIL/uL — ABNORMAL LOW (ref 3.87–5.11)
RDW: 22.1 % — ABNORMAL HIGH (ref 11.5–15.5)
WBC: 6.8 10*3/uL (ref 4.0–10.5)
nRBC: 0 % (ref 0.0–0.2)

## 2022-07-11 LAB — FOLATE: Folate: 7.4 ng/mL (ref >5.9)

## 2022-07-11 LAB — VITAMIN B12: Vitamin B-12: 2481 pg/mL — ABNORMAL HIGH (ref 180–914)

## 2022-07-11 MED ORDER — SODIUM CHLORIDE 0.9 % IV SOLN
300.0000 mg | Freq: Once | INTRAVENOUS | Status: AC
  Administered 2022-07-11: 300 mg via INTRAVENOUS
  Filled 2022-07-11: qty 300

## 2022-07-11 MED ORDER — CETIRIZINE HCL 10 MG PO TABS
10.0000 mg | ORAL_TABLET | Freq: Once | ORAL | Status: AC
  Administered 2022-07-11: 10 mg via ORAL
  Filled 2022-07-11: qty 1

## 2022-07-11 MED ORDER — SODIUM CHLORIDE 0.9 % IV SOLN: Freq: Once | INTRAVENOUS | Status: AC

## 2022-07-11 MED ORDER — ACETAMINOPHEN 325 MG PO TABS
650.0000 mg | ORAL_TABLET | Freq: Once | ORAL | Status: AC
  Administered 2022-07-11: 650 mg via ORAL
  Filled 2022-07-11: qty 2

## 2022-07-11 NOTE — Assessment & Plan Note (Deleted)
Quit smoking 05/29/2021  - Labs ordered 06/13/2021  :  alpha one AT phenotype MM  Level 177  Pt is Group B in terms of symptom/risk and laba/lama therefore appropriate rx at this point >>>  Anoro best choice esp since also has bronchiectasis plus approp saba:  Re SABA :  I spent extra time with pt today reviewing appropriate use of albuterol for prn use on exertion with the following points: 1) saba is for relief of sob that does not improve by walking a slower pace or resting but rather if the pt does not improve after trying this first. 2) If the pt is convinced, as many are, that saba helps recover from activity faster then it's easy to tell if this is the case by re-challenging : ie stop, take the inhaler, then p 5 minutes try the exact same activity (intensity of workload) that just caused the symptoms and see if they are substantially diminished or not after saba 3) if there is an activity that reproducibly causes the symptoms, try the saba 15 min before the activity on alternate days   If in fact the saba really does help, then fine to continue to use it prn but advised may need to look closer at the maintenance regimen being used to achieve better control of airways disease with exertion.

## 2022-07-11 NOTE — Progress Notes (Signed)
Patient presents today for Venofer infusion per providers order.  Vital signs WNL.  Patient has no new complaints at this time.    Peripheral IV started and blood return noted pre and post infusion.  Venofer infusion without adverse affects.  Vital signs stable.  No complaints at this time.  Discharge from clinic ambulatory in stable condition.  Alert and oriented X 3.  Follow up with [facility] as scheduled.  

## 2022-07-11 NOTE — Assessment & Plan Note (Addendum)
LDSCT  12/28/21  Bilateral bronchiectasis and scattered areas of mucous plugging. Moderate centrilobular emphysema. New consolidation of the lingula.  Reviewed pathophysiology using broken escalator analogy with pt/fm Added mucinex up to 1200 mg bid and flutter valve rx   No need for MAI w/u at this point: This is an extremely common benign condition in the elderly and does not warrant aggressive eval/ rx at this point unless there is a clinical correlation suggesting unaddressed pulmonary infection (purulent sputum, night sweats, unintended wt loss, doe) or evolution of  obvious changes on plain cxr (as opposed to serial CT, which is way over sensitive to make clinical decisions re intervention and treatment in the elderly, who tend to tolerate both dx and treatment poorly) .

## 2022-07-11 NOTE — Assessment & Plan Note (Signed)
Started on 02 12/23/21 2lpm hs and prn by Hospitalist - 01/05/2022 patient walked at a slow pace on room air x 3 laps each 150 ft. . Desat to 87% at end of third lap and reported legs being tired on lap 2. SOB on last lap -  02/14/2022   Walked on RA  x  3  lap(s) =  approx 450  ft  @ mod pace, stopped due to end of study s sob  with lowest 02 sats 94%    Again advised: Make sure you check your oxygen saturation  AT  your highest level of activity (not after you stop)   to be sure it stays over 90% and adjust  02 flow upward to maintain this level if needed but remember to turn it back to previous settings when you stop (to conserve your supply).          Each maintenance medication was reviewed in detail including emphasizing most importantly the difference between maintenance and prns and under what circumstances the prns are to be triggered using an action plan format where appropriate.  Total time for H and P, chart review, counseling, reviewing hfa/dpi/02  device(s) and generating customized AVS unique to this office visit / same day charting = > 30 min

## 2022-07-11 NOTE — Assessment & Plan Note (Addendum)
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    - referred to pulmonary rehab 07/10/2022  - 07/10/2022  The proper method of use, as well as anticipated side effects, of a metered-dose inhaler were discussed and demonstrated to the patient using teach back method for dpi and hfa    Pt is Group B in terms of symptom/risk and laba/lama therefore appropriate rx at this point >>>  Anoro best choice esp since also has bronchiectasis plus approp saba:  Re SABA :  I spent extra time with pt today reviewing appropriate use of albuterol for prn use on exertion with the following points: 1) saba is for relief of sob that does not improve by walking a slower pace or resting but rather if the pt does not improve after trying this first. 2) If the pt is convinced, as many are, that saba helps recover from activity faster then it's easy to tell if this is the case by re-challenging : ie stop, take the inhaler, then p 5 minutes try the exact same activity (intensity of workload) that just caused the symptoms and see if they are substantially diminished or not after saba 3) if there is an activity that reproducibly causes the symptoms, try the saba 15 min before the activity on alternate days   If in fact the saba really does help, then fine to continue to use it prn but advised may need to look closer at the maintenance regimen being used to achieve better control of airways disease with exertion.

## 2022-07-11 NOTE — Patient Instructions (Signed)
[facility]  Discharge Instructions: Thank you for choosing [facility] to provide your oncology and hematology care.  If you have a lab appointment with the Cancer Center - please note that after April 8th, 2024, all labs will be drawn in the cancer center.  You do not have to check in or register with the main entrance as you have in the past but will complete your check-in in the cancer center.  Wear comfortable clothing and clothing appropriate for easy access to any Portacath or PICC line.   We strive to give you quality time with your provider. You may need to reschedule your appointment if you arrive late (15 or more minutes).  Arriving late affects you and other patients whose appointments are after yours.  Also, if you miss three or more appointments without notifying the office, you may be dismissed from the clinic at the provider's discretion.      For prescription refill requests, have your pharmacy contact our office and allow 72 hours for refills to be completed.    Today you received the following chemotherapy and/or immunotherapy agents Venofer      To help prevent nausea and vomiting after your treatment, we encourage you to take your nausea medication as directed.  BELOW ARE SYMPTOMS THAT SHOULD BE REPORTED IMMEDIATELY: *FEVER GREATER THAN 100.4 F (38 C) OR HIGHER *CHILLS OR SWEATING *NAUSEA AND VOMITING THAT IS NOT CONTROLLED WITH YOUR NAUSEA MEDICATION *UNUSUAL SHORTNESS OF BREATH *UNUSUAL BRUISING OR BLEEDING *URINARY PROBLEMS (pain or burning when urinating, or frequent urination) *BOWEL PROBLEMS (unusual diarrhea, constipation, pain near the anus) TENDERNESS IN MOUTH AND THROAT WITH OR WITHOUT PRESENCE OF ULCERS (sore throat, sores in mouth, or a toothache) UNUSUAL RASH, SWELLING OR PAIN  UNUSUAL VAGINAL DISCHARGE OR ITCHING   Items with * indicate a potential emergency and should be followed up as soon as possible or go to the  Emergency Department if any problems should occur.  Please show the CHEMOTHERAPY ALERT CARD or IMMUNOTHERAPY ALERT CARD at check-in to the Emergency Department and triage nurse.  Should you have questions after your visit or need to cancel or reschedule your appointment, please contact [facility] [phone]  and follow the prompts.  Office hours are 8:00 a.m. to 4:30 p.m. Monday - Friday. Please note that voicemails left after 4:00 p.m. may not be returned until the following business day.  We are closed weekends and major holidays. You have access to a nurse at all times for urgent questions. Please call the main number to the clinic [phone] and follow the prompts.  For any non-urgent questions, you may also contact your provider using MyChart. We now offer e-Visits for anyone 18 and older to request care online for non-urgent symptoms. For details visit [email or web address].   Also download the MyChart app! Go to the app store, search "MyChart", open the app, select [place], and log in with your MyChart username and password.   

## 2022-07-12 LAB — KAPPA/LAMBDA LIGHT CHAINS
Kappa free light chain: 28.9 mg/L — ABNORMAL HIGH (ref 3.3–19.4)
Kappa, lambda light chain ratio: 1.08 (ref 0.26–1.65)
Lambda free light chains: 26.7 mg/L — ABNORMAL HIGH (ref 5.7–26.3)

## 2022-07-13 LAB — PROTEIN ELECTROPHORESIS, SERUM
A/G Ratio: 1.1 (ref 0.7–1.7)
Albumin ELP: 3.8 g/dL (ref 2.9–4.4)
Alpha-1-Globulin: 0.3 g/dL (ref 0.0–0.4)
Alpha-2-Globulin: 0.7 g/dL (ref 0.4–1.0)
Beta Globulin: 1.1 g/dL (ref 0.7–1.3)
Gamma Globulin: 1.4 g/dL (ref 0.4–1.8)
Globulin, Total: 3.4 g/dL (ref 2.2–3.9)
Total Protein ELP: 7.2 g/dL (ref 6.0–8.5)

## 2022-07-14 LAB — COPPER, SERUM: Copper: 121 ug/dL (ref 80–158)

## 2022-07-15 NOTE — Progress Notes (Unsigned)
Virtual Visit via Telephone Note   Because of Desiree Barber co-morbid illnesses, she is at least at moderate risk for complications without adequate follow up.  This format is felt to be most appropriate for this patient at this time.  The patient did not have access to video technology/had technical difficulties with video requiring transitioning to audio format only (telephone).  All issues noted in this document were discussed and addressed.  No physical exam could be performed with this format.  Please refer to the patient's chart for her consent to telehealth for [facility].  Evaluation Performed:  Preoperative cardiovascular risk assessment _____________   Date:  07/16/2022   Patient ID:  Desiree Barber, DOB 1950/09/07, MRN [id number] Patient Location:  Home Provider location:   Office  Primary Care Provider:  Assunta Found, MD Primary Cardiologist:  Meriam Sprague, MD  Chief Complaint / Patient Profile   72 y.o. y/o female with a h/o HTN, coronary artery calcification seen on lung cancer screening CT 01/2022, prior tobacco abuse, COPD who is pending colonoscopy/EGD and presents today for telephonic preoperative cardiovascular risk assessment.  History of Present Illness    Desiree Barber is a 72 y.o. female who presents via audio/video conferencing for a telehealth visit today.  Pt was last seen in cardiology clinic on 02/19/22 by Dr. Shari Prows.  At that time Desiree Barber was prescribed cardiac PET scan to evaluate for ischemia. PET was completed on 05/08/22 and revealed normal blood flow, low risk study.  The patient is now pending procedure as outlined above. Since her last visit, she denies chest pain, lower extremity edema, fatigue, palpitations, melena, hematuria, hemoptysis, diaphoresis, weakness, presyncope, syncope, orthopnea, and PND. She reports chronic shortness of breath that is stable. She smoked 55 years and stopped about 14 months ago.    Past Medical History    Past Medical History:  Diagnosis Date   Anemia    Anxiety    Occasional   Arthritis    COPD (chronic obstructive pulmonary disease)    HTN (hypertension)    Iron deficiency anemia due to chronic blood loss 03/28/2022   Past Surgical History:  Procedure Laterality Date   ABDOMINAL AORTOGRAM W/LOWER EXTREMITY N/A 04/10/2022   Procedure: ABDOMINAL AORTOGRAM W/LOWER EXTREMITY;  Surgeon: Nada Libman, MD;  [facility]: [facility];  Service: Cardiovascular;  Laterality: N/A;   COLONOSCOPY  02/23/04   RMR: normal rectum and colon.minimal internal hemorrhoids   COLONOSCOPY WITH PROPOFOL N/A 06/13/2015   Procedure: COLONOSCOPY WITH PROPOFOL;  Surgeon: Corbin Ade, MD;  Location: [facility];  Service: Endoscopy;  Laterality: N/A;  1045   POLYPECTOMY  06/13/2015   Procedure: POLYPECTOMY;  Surgeon: Corbin Ade, MD;  Location: [facility];  Service: Endoscopy;;  sigmoid colon polyp   TOOTH EXTRACTION     TRIGGER FINGER RELEASE Bilateral    TUBAL LIGATION      Allergies  Allergies  Allergen Reactions   Demerol [Meperidine] Other (See Comments)    Unknown   Oxycontin [Oxycodone Hcl] Other (See Comments)    Unknown   Fish Allergy Nausea Only    Only fish from [place]   Lavender Oil    Other     Flower oils     Home Medications    Prior to Admission medications   Medication Sig Start Date End Date Taking? Authorizing Provider  albuterol (PROVENTIL) (2.5 MG/3ML) 0.083% nebulizer solution Up to every 4 hours as  needed 07/10/22   Nyoka Cowden, MD  albuterol (VENTOLIN HFA) 108 (90 Base) MCG/ACT inhaler Inhale into the lungs. 02/20/22   [provider]  ALPRAZolam Prudy Feeler) 0.25 MG tablet Take 0.25 mg by mouth daily as needed for anxiety or sleep. 06/12/21   [provider]  Ascorbic Acid (VITAMIN C) 100 MG tablet Take 100 mg by mouth daily.    [provider]  aspirin EC 81 MG tablet Take 1 tablet (81 mg total)  by mouth daily. Swallow whole. 04/10/22 04/10/23  Nada Libman, MD  clopidogrel (PLAVIX) 75 MG tablet Take 1 tablet (75 mg total) by mouth daily. 04/10/22   Nada Libman, MD  losartan (COZAAR) 25 MG tablet Take 1 tablet (25 mg total) by mouth daily. 02/19/22   Meriam Sprague, MD  pantoprazole (PROTONIX) 40 MG tablet Take 1 tablet (40 mg total) by mouth daily. 04/10/22   Meriam Sprague, MD  TRELEGY ELLIPTA 100-62.5-25 MCG/ACT AEPB Inhale 1 puff into the lungs daily. 05/01/22   [provider]  umeclidinium-vilanterol (ANORO ELLIPTA) 62.5-25 MCG/ACT AEPB Inhale 1 puff into the lungs daily. 01/05/22   Nyoka Cowden, MD  vitamin B-12 (CYANOCOBALAMIN) 100 MCG tablet Take 100 mcg by mouth daily.    [provider]  VITAMIN D, CHOLECALCIFEROL, PO Take by mouth. One daily    [provider]    Physical Exam    Vital Signs:  Desiree Barber does not have vital signs available for review today.  Given telephonic nature of communication, physical exam is limited. AAOx3. NAD. Normal affect.  Speech and respirations are unlabored.  Accessory Clinical Findings    None  Assessment & Plan    1.  Preoperative Cardiovascular Risk Assessment: According to the Revised Cardiac Risk Index (RCRI), her Perioperative Risk of Major Cardiac Event is (%): 0.4. Her Functional Capacity in METs is: 5.07 according to the Duke Activity Status Index (DASI). The patient is doing well from a cardiac perspective. Therefore, based on ACC/AHA guidelines, the patient would be at acceptable risk for the planned procedure without further cardiovascular testing.   The patient was advised that if she develops new symptoms prior to surgery to contact our office to arrange for a follow-up visit, and she verbalized understanding.  Plavix is prescribed by vascular surgery, therefore clearance to hold should be addressed with them.  A copy of this note will be routed to requesting  surgeon.  Time:   Today, I have spent 8 minutes with the patient with telehealth technology discussing medical history, symptoms, and management plan.    Levi Aland, NP-C  07/16/2022, 2:18 PM [address], [address] Office [phone] Fax [phone]

## 2022-07-16 ENCOUNTER — Encounter: Payer: Self-pay | Admitting: Nurse Practitioner

## 2022-07-16 ENCOUNTER — Ambulatory Visit: Payer: Medicare Other | Attending: Cardiology | Admitting: Nurse Practitioner

## 2022-07-16 DIAGNOSIS — Z01810 Encounter for preprocedural cardiovascular examination: Secondary | ICD-10-CM

## 2022-07-17 LAB — IMMUNOFIXATION ELECTROPHORESIS
IgA: 351 mg/dL (ref 64–422)
IgG (Immunoglobin G), Serum: 1148 mg/dL (ref 586–1602)
IgM (Immunoglobulin M), Srm: 145 mg/dL (ref 26–217)
Total Protein ELP: 6.7 g/dL (ref 6.0–8.5)

## 2022-07-18 NOTE — Telephone Encounter (Signed)
Pt had tele visit on 07/16/22. Please advise. Thank you

## 2022-07-19 LAB — METHYLMALONIC ACID, SERUM: Methylmalonic Acid, Quantitative: 66 nmol/L (ref 0–378)

## 2022-07-19 NOTE — Telephone Encounter (Signed)
Faxed clearance to Dr.Todd Early

## 2022-07-27 NOTE — Telephone Encounter (Signed)
I don't have access to that, only susan does

## 2022-07-27 NOTE — Telephone Encounter (Signed)
Clearance placed on providers desk. Please advise. Thank you 

## 2022-07-30 ENCOUNTER — Encounter: Payer: Self-pay | Admitting: *Deleted

## 2022-07-30 ENCOUNTER — Other Ambulatory Visit: Payer: Self-pay | Admitting: *Deleted

## 2022-07-30 MED ORDER — NA SULFATE-K SULFATE-MG SULF 17.5-3.13-1.6 GM/177ML PO SOLN: ORAL | 0 refills | Status: DC

## 2022-07-30 NOTE — Telephone Encounter (Signed)
Pt has been scheduled for 08/27/22, instructions mailed and prep sent to the pharmacy

## 2022-08-07 NOTE — Progress Notes (Unsigned)
Cardiology Office Note:    Date:  08/09/2022   ID:  Desiree Barber, DOB [birth year], MRN [id number]  PCP:  Assunta Found, MD    [facility] Providers Cardiologist:  Meriam Sprague, MD    Referring MD: Assunta Found, MD    History of Present Illness:    Desiree Barber is a 72 y.o. female with a hx of HTN, coronary artery calcification, and COPD who presents to clinic for follow-up.  Patient followed by Dr. Sherene Sires on 02/14/22 for COPD. She had a lung cancer screening CT chest which showed severe coronary artery calcification and severe atherosclerosis in the aorta prompting referral to Cardiology for further evaluation.  Was last seen in clinic 01/2022 where she established care for coronary Ca. NM PET was normal. TTE 07/2019 with LVEF 70-75%, G1DD, normal RV, no significant valve disease.   Seen by vascular for claudication. She underwent aortogram with lower extremity runoff on 04/10/2022. 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 She underwent treatment with kissing stents at the aortic bifurcation and also separate stenting to her right external iliac artery.   Today, the patient overall feels well. Leg symptoms are improved after stenting but still has some fatigue in her legs. Continues to have dyspnea on exertion which she thinks is related to her COPD. She is able to work in her yard without issue. No orthopnea, PND, LE edema.   Blood pressure running 122-130/60-70s.   Prior heavy smoking history 2ppd x55 years.   Past Medical History:  Diagnosis Date   Anemia    Anxiety    Occasional   Arthritis    COPD (chronic obstructive pulmonary disease) (HCC)    HTN (hypertension)    Iron deficiency anemia due to chronic blood loss 03/28/2022    Past Surgical History:  Procedure Laterality Date   ABDOMINAL AORTOGRAM W/LOWER EXTREMITY N/A 04/10/2022   Procedure: ABDOMINAL AORTOGRAM W/LOWER EXTREMITY;  Surgeon: Nada Libman, MD;  [facility]: [facility];  Service: Cardiovascular;  Laterality: N/A;   COLONOSCOPY  02/23/04   RMR: normal rectum and colon.minimal internal hemorrhoids   COLONOSCOPY WITH PROPOFOL N/A 06/13/2015   Procedure: COLONOSCOPY WITH PROPOFOL;  Surgeon: Corbin Ade, MD;  [facility];  Service: Endoscopy;  Laterality: N/A;  1045   POLYPECTOMY  06/13/2015   Procedure: POLYPECTOMY;  Surgeon: Corbin Ade, MD;  [facility];  Service: Endoscopy;;  sigmoid colon polyp   TOOTH EXTRACTION     TRIGGER FINGER RELEASE Bilateral    TUBAL LIGATION      Current Medications: Current Meds  Medication Sig   albuterol (PROVENTIL) (2.5 MG/3ML) 0.083% nebulizer solution Up to every 4 hours as needed   albuterol (VENTOLIN HFA) 108 (90 Base) MCG/ACT inhaler Inhale into the lungs.   ALPRAZolam (XANAX) 0.25 MG tablet Take 0.25 mg by mouth daily as needed for anxiety or sleep.   Ascorbic Acid (VITAMIN C) 100 MG tablet Take 100 mg by mouth daily.   aspirin EC 81 MG tablet Take 1 tablet (81 mg total) by mouth daily. Swallow whole.   clopidogrel (PLAVIX) 75 MG tablet Take 1 tablet (75 mg total) by mouth daily.   losartan (COZAAR) 25 MG tablet Take 1 tablet (25 mg total) by mouth daily.   Na Sulfate-K Sulfate-Mg Sulf 17.5-3.13-1.6 GM/177ML SOLN As directed   pantoprazole (PROTONIX) 40 MG tablet Take 1 tablet (40 mg total) by mouth daily.   TRELEGY  ELLIPTA 100-62.5-25 MCG/ACT AEPB Inhale 1 puff into the lungs daily.   umeclidinium-vilanterol (ANORO ELLIPTA) 62.5-25 MCG/ACT AEPB Inhale 1 puff into the lungs daily.   vitamin B-12 (CYANOCOBALAMIN) 100 MCG tablet Take 100 mcg by mouth daily.   VITAMIN D, CHOLECALCIFEROL, PO Take by mouth. One daily     Allergies:   Demerol [meperidine], Oxycontin [oxycodone hcl], Fish allergy, Lavender oil, and Other   Social History   Socioeconomic History   Marital status: Married    Spouse name: Not on file   Number of children: Not on  file   Years of education: Not on file   Highest education level: Not on file  Occupational History   Not on file  Tobacco Use   Smoking status: Former    Packs/day: 0.50    Years: 55.00    Additional pack years: 0.00    Total pack years: 27.50    Types: Cigarettes    Quit date: 05/29/2021    Years since quitting: 1.1    Passive exposure: Past   Smokeless tobacco: Former   Tobacco comments:    Quit date 05/29/2021  Vaping Use   Vaping Use: Never used  Substance and Sexual Activity   Alcohol use: Yes    Alcohol/week: 0.0 standard drinks of alcohol    Comment: 3 beers a day   Drug use: No   Sexual activity: Yes    Birth control/protection: Surgical  Other Topics Concern   Not on file  Social History Narrative   Not on file   Social Determinants of Health   Financial Resource Strain: Not on file  Food Insecurity: Unknown (12/24/2021)   Hunger Vital Sign    Worried About Running Out of Food in the Last Year: Never true    Ran Out of Food in the Last Year: Not on file  Transportation Needs: No Transportation Needs (12/24/2021)   PRAPARE - Administrator, Civil Service (Medical): No    Lack of Transportation (Non-Medical): No  Physical Activity: Not on file  Stress: Not on file  Social Connections: Not on file     Family History: The patient's family history is negative for Colon cancer and Breast cancer.  ROS:   Please see the history of present illness.     All other systems reviewed and are negative.  EKGs/Labs/Other Studies Reviewed:    The following studies were reviewed today: Cardiac Studies & Procedures     STRESS TESTS  NM PET CT CARDIAC PERFUSION MULTI W/ABSOLUTE BLOODFLOW 05/08/2022  Narrative   The study is normal. The study is low to intermediate risk- TID is (1.23) with no other increased risk features.   Rest left ventricular function is normal. Rest EF: 72 %. Stress left ventricular function is normal. Stress EF: 77 %. End diastolic  cavity size is normal. End systolic cavity size is normal.   Myocardial blood flow was computed to be 1.9ml/g/min at rest and 2.71ml/g/min at stress. Global myocardial blood flow reserve was 1.81 and was mildly abnormal.  Given normal stress flows this is likely due abnormal resting flow.   Coronary calcium was present on the attenuation correction CT images. Severe coronary calcifications were present. Coronary calcifications were present in the left anterior descending artery, left circumflex artery and right coronary artery distribution(s). Heavy aortic athersoclerosis.  Mild aortic valve calcium.  Mild to moderate mitral annular calcification.   Electronically Signed by Riley Lam M.D.  EXAM: OVER-READ INTERPRETATION  CT CHEST  The  following report is a limited chest CT over-read performed by radiologist Dr. Irma Newness [facility] on 05/08/2022. This over-read does not include interpretation of cardiac or coronary anatomy or pathology nor does it include evaluation of the PET data. The cardiac PET-CT interpretation by the cardiologist is attached.  COMPARISON:  Low-dose lung cancer screening CT 04/04/2022  FINDINGS: Mediastinum/Nodes: No enlarged lymph nodes within the visualized mediastinum.Aortic atherosclerosis again noted. There is a small hiatal hernia.  Lungs/Pleura: No pleural effusion or pneumothorax. Mild centrilobular and paraseptal emphysema with diffuse central airway thickening, scattered bronchiectasis and mucoid impaction. Chronic volume loss and inflammation in the right middle lobe are stable. Scattered pulmonary nodules are unchanged.  Upper abdomen: No significant findings in the visualized upper abdomen.  Musculoskeletal/Chest wall: No chest wall mass or suspicious osseous findings within the visualized chest.  IMPRESSION: 1. No acute extracardiac findings within the visualized lower chest. 2. Stable chronic lung disease and  scattered pulmonary nodules compared with recent low dose lung cancer screening CT. Please refer to that examination for follow-up recommendations. 3. Aortic Atherosclerosis (ICD10-I70.0) and Emphysema (ICD10-J43.9).   Electronically Signed By: Carey Bullocks M.D. On: 05/08/2022 11:52   ECHOCARDIOGRAM  ECHOCARDIOGRAM COMPLETE 07/25/2019  Narrative ECHOCARDIOGRAM REPORT    Patient Name:   LEWANA MINUCCI Date of Exam: 07/25/2019 Medical Rec #:  [id number]         Height:       62.5 in Accession #:    [id number]        Weight:       92.0 lb Date of Birth:  [birth year]         BSA:          1.383 m Patient Age:    68 years          BP:           127/62 mmHg Patient Gender: F                 HR:           90 bpm. Exam Location:  Inpatient  Procedure: 2D Echo  Indications:    CHF-Acute Diastolic 428.31 / I50.31  History:        Patient has no prior history of Echocardiogram examinations. Risk Factors:Hypertension. Acute respiratory failure with hypoxia.  Sonographer:    Leeroy Bock Turrentine Referring Phys: [phone] JARED M GARDNER   Sonographer Comments: Patient unable to tolerate probe in SSN window. IMPRESSIONS   1. Vigorous LV systolic function; grade 1 diastolic dysfunction. 2. Left ventricular ejection fraction, by estimation, is 70 to 75%. The left ventricle has hyperdynamic function. The left ventricle has no regional wall motion abnormalities. Left ventricular diastolic parameters are consistent with Grade I diastolic dysfunction (impaired relaxation). 3. Right ventricular systolic function is normal. The right ventricular size is normal. 4. The mitral valve is normal in structure. No evidence of mitral valve regurgitation. No evidence of mitral stenosis. 5. The aortic valve is tricuspid. Aortic valve regurgitation is not visualized. Mild to moderate aortic valve sclerosis/calcification is present, without any evidence of aortic stenosis. 6. The inferior vena cava is normal  in size with greater than 50% respiratory variability, suggesting right atrial pressure of 3 mmHg.  FINDINGS Left Ventricle: Left ventricular ejection fraction, by estimation, is 70 to 75%. The left ventricle has hyperdynamic function. The left ventricle has no regional wall motion abnormalities. The left ventricular internal cavity size was normal in size. There is no left ventricular  hypertrophy. Left ventricular diastolic parameters are consistent with Grade I diastolic dysfunction (impaired relaxation).  Right Ventricle: The right ventricular size is normal. Right ventricular systolic function is normal.  Left Atrium: Left atrial size was normal in size.  Right Atrium: Right atrial size was normal in size.  Pericardium: There is no evidence of pericardial effusion.  Mitral Valve: The mitral valve is normal in structure. Normal mobility of the mitral valve leaflets. Moderate mitral annular calcification. No evidence of mitral valve regurgitation. No evidence of mitral valve stenosis.  Tricuspid Valve: The tricuspid valve is normal in structure. Tricuspid valve regurgitation is not demonstrated. No evidence of tricuspid stenosis.  Aortic Valve: The aortic valve is tricuspid. Aortic valve regurgitation is not visualized. Mild to moderate aortic valve sclerosis/calcification is present, without any evidence of aortic stenosis. Aortic valve mean gradient measures 7.0 mmHg. Aortic valve peak gradient measures 11.3 mmHg. Aortic valve area, by VTI measures 1.94 cm.  Pulmonic Valve: The pulmonic valve was normal in structure. Pulmonic valve regurgitation is not visualized. No evidence of pulmonic stenosis.  Aorta: The aortic root is normal in size and structure.  Venous: The inferior vena cava is normal in size with greater than 50% respiratory variability, suggesting right atrial pressure of 3 mmHg.  IAS/Shunts: No atrial level shunt detected by color flow Doppler.  Additional Comments:  Vigorous LV systolic function; grade 1 diastolic dysfunction.   LEFT VENTRICLE PLAX 2D LVIDd:         3.70 cm  Diastology LVIDs:         2.40 cm  LV e' lateral:   8.27 cm/s LV PW:         0.90 cm  LV E/e' lateral: 14.5 LV IVS:        0.90 cm  LV e' medial:    7.72 cm/s LVOT diam:     1.60 cm  LV E/e' medial:  15.5 LV SV:         62 LV SV Index:   45 LVOT Area:     2.01 cm   RIGHT VENTRICLE RV S prime:     11.50 cm/s TAPSE (M-mode): 2.1 cm  LEFT ATRIUM             Index       RIGHT ATRIUM           Index LA diam:        3.20 cm 2.31 cm/m  RA Area:     11.90 cm LA Vol (A2C):   32.2 ml 23.29 ml/m RA Volume:   27.30 ml  19.75 ml/m LA Vol (A4C):   27.4 ml 19.82 ml/m LA Biplane Vol: 29.9 ml 21.63 ml/m AORTIC VALVE AV Area (Vmax):    1.70 cm AV Area (Vmean):   1.85 cm AV Area (VTI):     1.94 cm AV Vmax:           168.00 cm/s AV Vmean:          122.000 cm/s AV VTI:            0.319 m AV Peak Grad:      11.3 mmHg AV Mean Grad:      7.0 mmHg LVOT Vmax:         142.00 cm/s LVOT Vmean:        112.000 cm/s LVOT VTI:          0.308 m LVOT/AV VTI ratio: 0.97  AORTA Ao Root diam: 2.40 cm  MITRAL VALVE  MV Area (PHT): 4.86 cm     SHUNTS MV Decel Time: 156 msec     Systemic VTI:  0.31 m MV E velocity: 120.00 cm/s  Systemic Diam: 1.60 cm MV A velocity: 126.00 cm/s MV E/A ratio:  0.95  Olga Millers MD Electronically signed by Olga Millers MD Signature Date/Time: 07/25/2019/1:12:23 PM    Final                EKG:  EKG is  ordered today.  The ekg ordered today demonstrates NSR, iRBBB, HR 82  Recent Labs: 12/23/2021: B Natriuretic Peptide 57.0 12/24/2021: ALT 20; Magnesium 2.2 04/10/2022: BUN 9; Creatinine, Ser 0.60; Potassium 4.3; Sodium 142 07/11/2022: Hemoglobin 10.1; Platelets 401  Recent Lipid Panel    Component Value Date/Time   CHOL 150 03/01/2022 0841   TRIG 38 03/01/2022 0841   HDL 77 03/01/2022 0841   CHOLHDL 1.9 03/01/2022 0841   VLDL 8 03/01/2022  0841   LDLCALC 65 03/01/2022 0841     Risk Assessment/Calculations:          Physical Exam:    VS:  BP (!) 142/80   Pulse 78   Ht 5\' 4"  (1.626 m)   Wt 127 lb 3.2 oz (57.7 kg)   BMI 21.83 kg/m     Wt Readings from Last 3 Encounters:  08/09/22 127 lb 3.2 oz (57.7 kg)  07/10/22 125 lb 6.4 oz (56.9 kg)  06/13/22 123 lb 3.2 oz (55.9 kg)     GEN:  Well nourished, well developed in no acute distress HEENT: Normal NECK: No JVD; No carotid bruits CARDIAC: RRR,  1/6 systolic murmur RESPIRATORY:  Clear to auscultation without rales, wheezing or rhonchi  ABDOMEN: Soft, non-tender, non-distended MUSCULOSKELETAL:  No edema, warm SKIN: Warm and dry NEUROLOGIC:  Alert and oriented x 3 PSYCHIATRIC:  Normal affect   ASSESSMENT:    1. Coronary artery calcification   2. PAD (peripheral artery disease) (HCC)   3. Claudication (HCC)   4. Chronic obstructive pulmonary disease, unspecified COPD type (HCC)   5. History of tobacco use   6. Primary hypertension     PLAN:    In order of problems listed above:  #Coronary Artery Ca: #Multivessel CAD: Noted on CT chest. NM PET normal with no ischemia/infarction -Continue crestor 20mg  daily -Continue plavix 75mg  daily  #PAD: S/p right common and right external iliac stenting with Dr. Arbie Cookey. Currently doing well. -Continue crestor 20mg  daily -Continue plavix 75mg  daily  #HTN: -Well controlled at home -Continue losartan 25mg  daily -Goal BP <130/90  #Aortic Athertosclerosis: -Continue crestor 20mg  daily -LDL 65  #COPD: Followed with Dr. Sherene Sires. Overall improved symptoms since stopping smoking. -Planned for Pulmonary rehab  #History of Tobacco Abuse: Quit 9 months ago. Prior heavy smoker (2ppd x55years). -Enrolled in CT lung cancer screening           Medication Adjustments/Labs and Tests Ordered: Current medicines are reviewed at length with the patient today.  Concerns regarding medicines are outlined above.  No orders  of the defined types were placed in this encounter.  No orders of the defined types were placed in this encounter.   Patient Instructions  Medication Instructions:   Your physician recommends that you continue on your current medications as directed. Please refer to the Current Medication list given to you today.  *If you need a refill on your cardiac medications before your next appointment, please call your pharmacy*   Follow-Up: At [facility], you and your health  needs are our priority.  As part of our continuing mission to provide you with exceptional heart care, we have created designated Provider Care Teams.  These Care Teams include your primary Cardiologist (physician) and Advanced Practice Providers (APPs -  Physician Assistants and Nurse Practitioners) who all work together to provide you with the care you need, when you need it.  We recommend signing up for the patient portal called "MyChart".  Sign up information is provided on this After Visit Summary.  MyChart is used to connect with patients for Virtual Visits (Telemedicine).  Patients are able to view lab/test results, encounter notes, upcoming appointments, etc.  Non-urgent messages can be sent to your provider as well.   To learn more about what you can do with MyChart, go to [email or web address].    Your next appointment:   6 month(s)  Provider:   Jari Favre, PA-C, Ronie Spies, PA-C, Robin Searing, NP, Jacolyn Reedy, PA-C, Eligha Bridegroom, NP, or Tereso Newcomer, PA-C             Signed, Meriam Sprague, MD  08/09/2022 4:12 PM    [facility]

## 2022-08-07 NOTE — Progress Notes (Signed)
Fax received from [facility] on 07/19/22 for medical clearance/medication hold for colonoscopy/EGD to be signed by T.F. Early, MD.  Provider signed on 07/25/22, form faxed back to sender on 07/25/22, verified successful, sent to scan center.

## 2022-08-09 ENCOUNTER — Encounter: Payer: Self-pay | Admitting: Cardiology

## 2022-08-09 ENCOUNTER — Ambulatory Visit: Payer: Medicare Other | Attending: Cardiology | Admitting: Cardiology

## 2022-08-09 VITALS — BP 142/80 | HR 78 | Ht 64.0 in | Wt 127.2 lb

## 2022-08-09 DIAGNOSIS — Z87891 Personal history of nicotine dependence: Secondary | ICD-10-CM | POA: Diagnosis not present

## 2022-08-09 DIAGNOSIS — I2510 Atherosclerotic heart disease of native coronary artery without angina pectoris: Secondary | ICD-10-CM | POA: Diagnosis not present

## 2022-08-09 DIAGNOSIS — J449 Chronic obstructive pulmonary disease, unspecified: Secondary | ICD-10-CM | POA: Insufficient documentation

## 2022-08-09 DIAGNOSIS — I10 Essential (primary) hypertension: Secondary | ICD-10-CM | POA: Diagnosis not present

## 2022-08-09 DIAGNOSIS — I2584 Coronary atherosclerosis due to calcified coronary lesion: Secondary | ICD-10-CM | POA: Insufficient documentation

## 2022-08-09 DIAGNOSIS — I739 Peripheral vascular disease, unspecified: Secondary | ICD-10-CM | POA: Diagnosis not present

## 2022-08-09 NOTE — Patient Instructions (Signed)
Medication Instructions:   Your physician recommends that you continue on your current medications as directed. Please refer to the Current Medication list given to you today.  *If you need a refill on your cardiac medications before your next appointment, please call your pharmacy*   Follow-Up: At [facility], you and your health needs are our priority.  As part of our continuing mission to provide you with exceptional heart care, we have created designated Provider Care Teams.  These Care Teams include your primary Cardiologist (physician) and Advanced Practice Providers (APPs -  Physician Assistants and Nurse Practitioners) who all work together to provide you with the care you need, when you need it.  We recommend signing up for the patient portal called "MyChart".  Sign up information is provided on this After Visit Summary.  MyChart is used to connect with patients for Virtual Visits (Telemedicine).  Patients are able to view lab/test results, encounter notes, upcoming appointments, etc.  Non-urgent messages can be sent to your provider as well.   To learn more about what you can do with MyChart, go to [email or web address].    Your next appointment:   6 month(s)  Provider:   Jari Favre, PA-C, Ronie Spies, PA-C, Robin Searing, NP, Jacolyn Reedy, PA-C, Eligha Bridegroom, NP, or Tereso Newcomer, PA-C

## 2022-08-17 ENCOUNTER — Inpatient Hospital Stay: Payer: Medicare Other | Attending: Hematology

## 2022-08-22 ENCOUNTER — Encounter (HOSPITAL_COMMUNITY)
Admission: RE | Admit: 2022-08-22 | Discharge: 2022-08-22 | Disposition: A | Discharge status: Home / Self Care | Payer: Medicare Other | Source: Ambulatory Visit | Attending: Internal Medicine | Admitting: Internal Medicine

## 2022-08-22 VITALS — BP 134/60 | HR 81 | Ht 64.0 in | Wt 127.0 lb

## 2022-08-22 DIAGNOSIS — J449 Chronic obstructive pulmonary disease, unspecified: Secondary | ICD-10-CM | POA: Insufficient documentation

## 2022-08-22 DIAGNOSIS — J9611 Chronic respiratory failure with hypoxia: Secondary | ICD-10-CM | POA: Diagnosis not present

## 2022-08-22 NOTE — Progress Notes (Signed)
..  Cardiac Rehab Medication Review by a Nurse   Does the patient  feel that his/her medications are working for him/her?  Yes   Has the patient been experiencing any side effects to the medications prescribed?  Bruising from taking Plavix.   Does the patient measure his/her own blood pressure or blood glucose at home?  Pt takes her blood pressure at home once a week.   Does the patient have any problems obtaining medications due to transportation or finances?   No   Understanding of regimen: good Understanding of indications: good Potential of compliance: good      Nurse comments: Reviewed pt's medications during orientation.

## 2022-08-22 NOTE — Progress Notes (Signed)
Pulmonary Individual Treatment Plan  Patient Details  Name: Desiree Barber MRN: [id number] Date of Birth: November 04, 1950 Referring Provider:   Flowsheet Row PULMONARY REHAB OTHER RESP ORIENTATION from 08/22/2022 in [facility] CARDIAC REHABILITATION  Referring Provider Dr. Sherene Sires       Initial Encounter Date:  Flowsheet Row PULMONARY REHAB OTHER RESP ORIENTATION from 08/22/2022 in [facility] CARDIAC REHABILITATION  Date 08/22/22       Visit Diagnosis: Chronic respiratory failure with hypoxia (HCC)  COPD mixed type (HCC)  Patient's Home Medications on Admission:   Current Outpatient Medications:    albuterol (PROVENTIL) (2.5 MG/3ML) 0.083% nebulizer solution, Up to every 4 hours as needed, Disp: 75 mL, Rfl: 12   ALPRAZolam (XANAX) 0.25 MG tablet, Take 0.25 mg by mouth daily as needed for anxiety or sleep., Disp: , Rfl:    Ascorbic Acid (VITAMIN C) 100 MG tablet, Take 100 mg by mouth daily., Disp: , Rfl:    clopidogrel (PLAVIX) 75 MG tablet, Take 1 tablet (75 mg total) by mouth daily., Disp: 30 tablet, Rfl: 11   losartan (COZAAR) 25 MG tablet, Take 1 tablet (25 mg total) by mouth daily., Disp: 90 tablet, Rfl: 2   Na Sulfate-K Sulfate-Mg Sulf 17.5-3.13-1.6 GM/177ML SOLN, As directed, Disp: 354 mL, Rfl: 0   pantoprazole (PROTONIX) 40 MG tablet, Take 1 tablet (40 mg total) by mouth daily., Disp: 30 tablet, Rfl: 11   TRELEGY ELLIPTA 100-62.5-25 MCG/ACT AEPB, Inhale 1 puff into the lungs daily., Disp: , Rfl:    umeclidinium-vilanterol (ANORO ELLIPTA) 62.5-25 MCG/ACT AEPB, Inhale 1 puff into the lungs daily., Disp: 1 each, Rfl: 11   vitamin B-12 (CYANOCOBALAMIN) 100 MCG tablet, Take 100 mcg by mouth daily., Disp: , Rfl:    VITAMIN D, CHOLECALCIFEROL, PO, Take 1 tablet by mouth daily. One daily, Disp: , Rfl:    albuterol (VENTOLIN HFA) 108 (90 Base) MCG/ACT inhaler, Inhale into the lungs. (Patient not taking: Reported on 08/22/2022), Disp: , Rfl:   Past Medical History: Past Medical History:   Diagnosis Date   Anemia    Anxiety    Occasional   Arthritis    COPD (chronic obstructive pulmonary disease) (HCC)    HTN (hypertension)    Iron deficiency anemia due to chronic blood loss 03/28/2022    Tobacco Use: Social History   Tobacco Use  Smoking Status Former   Packs/day: 0.50   Years: 55.00   Additional pack years: 0.00   Total pack years: 27.50   Types: Cigarettes   Quit date: 05/29/2021   Years since quitting: 1.2   Passive exposure: Past  Smokeless Tobacco Former  Tobacco Comments   Quit date 05/29/2021    Labs: Review Flowsheet       Latest Ref Rng & Units 03/01/2022 04/10/2022  Labs for ITP Cardiac and Pulmonary Rehab  Cholestrol 0 - 200 mg/dL 409  -  LDL (calc) 0 - 99 mg/dL 65  -  HDL-C >81 mg/dL 77  -  Trlycerides <191 mg/dL 38  -  TCO2 22 - 32 mmol/L - 28     Capillary Blood Glucose: No results found for: "GLUCAP"   Pulmonary Assessment Scores:  Pulmonary Assessment Scores     Row Name 08/22/22 1314         ADL UCSD   SOB Score total 34     Rest 0     Walk 3     Stairs 4     Bath 0     Dress 0  Shop 2       CAT Score   CAT Score 15       mMRC Score   mMRC Score 1             UCSD: Self-administered rating of dyspnea associated with activities of daily living (ADLs) 6-point scale (0 = "not at all" to 5 = "maximal or unable to do because of breathlessness")  Scoring Scores range from 0 to 120.  Minimally important difference is 5 units  CAT: CAT can identify the health impairment of COPD patients and is better correlated with disease progression.  CAT has a scoring range of zero to 40. The CAT score is classified into four groups of low (less than 10), medium (10 - 20), high (21-30) and very high (31-40) based on the impact level of disease on health status. A CAT score over 10 suggests significant symptoms.  A worsening CAT score could be explained by an exacerbation, poor medication adherence, poor inhaler technique, or  progression of COPD or comorbid conditions.  CAT MCID is 2 points  mMRC: mMRC (Modified Medical Research Council) Dyspnea Scale is used to assess the degree of baseline functional disability in patients of respiratory disease due to dyspnea. No minimal important difference is established. A decrease in score of 1 point or greater is considered a positive change.   Pulmonary Function Assessment:   Exercise Target Goals: Exercise Program Goal: Individual exercise prescription set using results from initial 6 min walk test and THRR while considering  patient's activity barriers and safety.   Exercise Prescription Goal: Initial exercise prescription builds to 30-45 minutes a day of aerobic activity, 2-3 days per week.  Home exercise guidelines will be given to patient during program as part of exercise prescription that the participant will acknowledge.  Activity Barriers & Risk Stratification:  Activity Barriers & Cardiac Risk Stratification - 08/22/22 1322       Activity Barriers & Cardiac Risk Stratification   Activity Barriers Arthritis;Back Problems;Neck/Spine Problems;Joint Problems;Deconditioning;Muscular Weakness;Shortness of Breath    Cardiac Risk Stratification High             6 Minute Walk:  6 Minute Walk     Row Name 08/22/22 1432         6 Minute Walk   Phase Initial     Distance 800 feet     Walk Time 6 minutes     # of Rest Breaks 1     MPH 1.5     METS 2.38     RPE 13     Perceived Dyspnea  13     VO2 Peak 8.33     Symptoms Yes (comment)     Comments pt needed one sittting break due to SOB     Resting HR 81 bpm     Resting BP 134/60     Resting Oxygen Saturation  92 %     Exercise Oxygen Saturation  during 6 min walk 80 %     Max Ex. HR 109 bpm     Max Ex. BP 140/68     2 Minute Post BP 136/60       Interval HR   1 Minute HR 93     2 Minute HR 104     3 Minute HR 103     4 Minute HR 109     5 Minute HR 103     6 Minute HR 105     2 Minute  Post HR 91     Interval Heart Rate? Yes       Interval Oxygen   Interval Oxygen? Yes     Baseline Oxygen Saturation % 92 %     1 Minute Oxygen Saturation % 90 %     1 Minute Liters of Oxygen 0 L     2 Minute Oxygen Saturation % 85 %     2 Minute Liters of Oxygen 0 L     3 Minute Oxygen Saturation % 88 %     3 Minute Liters of Oxygen 0 L     4 Minute Oxygen Saturation % 83 %     5 Minute Oxygen Saturation % 81 %     6 Minute Oxygen Saturation % 80 %     6 Minute Liters of Oxygen 0 L     2 Minute Post Oxygen Saturation % 92 %     2 Minute Post Liters of Oxygen 0 L              Oxygen Initial Assessment:  Oxygen Initial Assessment - 08/22/22 1437       Initial 6 min Walk   Oxygen Used None      Program Oxygen Prescription   Program Oxygen Prescription None   pt did not have accuret reading due to nail polish, will be using forhead sensor if nail polish isnt removed            Oxygen Re-Evaluation:   Oxygen Discharge (Final Oxygen Re-Evaluation):   Initial Exercise Prescription:  Initial Exercise Prescription - 08/22/22 1400       Date of Initial Exercise RX and Referring Provider   Date 08/22/22    Referring Provider Dr. Sherene Sires    Expected Discharge Date 01/03/23      Treadmill   MPH 1.3    Grade 0    Minutes 17      NuStep   Level 1    SPM 60    Minutes 22      Prescription Details   Frequency (times per week) 2    Duration Progress to 30 minutes of continuous aerobic without signs/symptoms of physical distress      Intensity   THRR 40-80% of Max Heartrate 60-119    Ratings of Perceived Exertion 11-13    Perceived Dyspnea 0-4      Resistance Training   Training Prescription Yes    Weight 3    Reps 10-15             Perform Capillary Blood Glucose checks as needed.  Exercise Prescription Changes:   Exercise Comments:   Exercise Goals and Review:   Exercise Goals     Row Name 08/22/22 1435             Exercise Goals    Increase Physical Activity Yes       Intervention Provide advice, education, support and counseling about physical activity/exercise needs.;Develop an individualized exercise prescription for aerobic and resistive training based on initial evaluation findings, risk stratification, comorbidities and participant's personal goals.       Expected Outcomes Short Term: Attend rehab on a regular basis to increase amount of physical activity.;Long Term: Exercising regularly at least 3-5 days a week.;Long Term: Add in home exercise to make exercise part of routine and to increase amount of physical activity.       Increase Strength and Stamina Yes       Intervention Provide advice, education,  support and counseling about physical activity/exercise needs.;Develop an individualized exercise prescription for aerobic and resistive training based on initial evaluation findings, risk stratification, comorbidities and participant's personal goals.       Expected Outcomes Short Term: Increase workloads from initial exercise prescription for resistance, speed, and METs.;Short Term: Perform resistance training exercises routinely during rehab and add in resistance training at home;Long Term: Improve cardiorespiratory fitness, muscular endurance and strength as measured by increased METs and functional capacity ( )       Able to understand and use rate of perceived exertion (RPE) scale Yes       Intervention Provide education and explanation on how to use RPE scale       Expected Outcomes Short Term: Able to use RPE daily in rehab to express subjective intensity level;Long Term:  Able to use RPE to guide intensity level when exercising independently       Able to understand and use Dyspnea scale Yes       Intervention Provide education and explanation on how to use Dyspnea scale       Expected Outcomes Short Term: Able to use Dyspnea scale daily in rehab to express subjective sense of shortness of breath during  exertion;Long Term: Able to use Dyspnea scale to guide intensity level when exercising independently       Knowledge and understanding of Target Heart Rate Range (THRR) Yes       Intervention Provide education and explanation of THRR including how the numbers were predicted and where they are located for reference       Expected Outcomes Short Term: Able to state/look up THRR;Long Term: Able to use THRR to govern intensity when exercising independently;Short Term: Able to use daily as guideline for intensity in rehab       Understanding of Exercise Prescription Yes       Intervention Provide education, explanation, and written materials on patient's individual exercise prescription       Expected Outcomes Short Term: Able to explain program exercise prescription;Long Term: Able to explain home exercise prescription to exercise independently                Exercise Goals Re-Evaluation :   Discharge Exercise Prescription (Final Exercise Prescription Changes):   Nutrition:  Target Goals: Understanding of nutrition guidelines, daily intake of sodium 1500mg , cholesterol 200mg , calories 30% from fat and 7% or less from saturated fats, daily to have 5 or more servings of fruits and vegetables.  Biometrics:  Pre Biometrics - 08/22/22 1436       Pre Biometrics   Height 5\' 4"  (1.626 m)    Weight 57.6 kg    Waist Circumference 33 inches    Hip Circumference 37 inches    Waist to Hip Ratio 0.89 %    BMI (Calculated) 21.79    Triceps Skinfold 29 mm    % Body Fat 35.7 %    Grip Strength 21.2 kg    Flexibility 0 in    Single Leg Stand 10.43 seconds              Nutrition Therapy Plan and Nutrition Goals:  Nutrition Therapy & Goals - 08/22/22 1330       Personal Nutrition Goals   Comments Pt eats a regular diet but has recently started to cut back on sweets.      Intervention Plan   Intervention Prescribe, educate and counsel regarding individualized specific dietary  modifications aiming towards targeted core components such as weight,  hypertension, lipid management, diabetes, heart failure and other comorbidities.;Nutrition handout(s) given to patient.    Expected Outcomes Short Term Goal: Understand basic principles of dietary content, such as calories, fat, sodium, cholesterol and nutrients.;Long Term Goal: Adherence to prescribed nutrition plan.             Nutrition Assessments:  Nutrition Assessments - 08/22/22 1336       MEDFICTS Scores   Pre Score 39            MEDIFICTS Score Key: ?70 Need to make dietary changes  40-70 Heart Healthy Diet ? 40 Therapeutic Level Cholesterol Diet   Picture Your Plate Scores: <16 Unhealthy dietary pattern with much room for improvement. 41-50 Dietary pattern unlikely to meet recommendations for good health and room for improvement. 51-60 More healthful dietary pattern, with some room for improvement.  >60 Healthy dietary pattern, although there may be some specific behaviors that could be improved.    Nutrition Goals Re-Evaluation:   Nutrition Goals Discharge (Final Nutrition Goals Re-Evaluation):   Psychosocial: Target Goals: Acknowledge presence or absence of significant depression and/or stress, maximize coping skills, provide positive support system. Participant is able to verbalize types and ability to use techniques and skills needed for reducing stress and depression.  Initial Review & Psychosocial Screening:  Initial Psych Review & Screening - 08/22/22 1328       Initial Review   Current issues with Current Sleep Concerns;Current Anxiety/Panic      Family Dynamics   Good Support System? Yes    Comments Pt has a good support sytem with her husband, her daughters, and her grandchildren.      Barriers   Psychosocial barriers to participate in program There are no identifiable barriers or psychosocial needs.      Screening Interventions   Interventions Encouraged to  exercise;Provide feedback about the scores to participant;To provide support and resources with identified psychosocial needs    Expected Outcomes Short Term goal: Utilizing psychosocial counselor, staff and physician to assist with identification of specific Stressors or current issues interfering with healing process. Setting desired goal for each stressor or current issue identified.;Long Term Goal: Stressors or current issues are controlled or eliminated.;Short Term goal: Identification and review with participant of any Quality of Life or Depression concerns found by scoring the questionnaire.;Long Term goal: The participant improves quality of Life and PHQ9 Scores as seen by post scores and/or verbalization of changes             Quality of Life Scores:  Quality of Life - 08/22/22 1450       Quality of Life   Select Quality of Life      Quality of Life Scores   Health/Function Pre 22.53 %    Socioeconomic Pre 29.17 %    Psych/Spiritual Pre 29.64 %    Family Pre 28.8 %    GLOBAL Pre 26.09 %            Scores of 19 and below usually indicate a poorer quality of life in these areas.  A difference of  2-3 points is a clinically meaningful difference.  A difference of 2-3 points in the total score of the Quality of Life Index has been associated with significant improvement in overall quality of life, self-image, physical symptoms, and general health in studies assessing change in quality of life.   PHQ-9: Review Flowsheet       08/22/2022  Depression screen PHQ 2/9  Decreased Interest 0  Down,  Depressed, Hopeless 0  PHQ - 2 Score 0  Altered sleeping 1  Tired, decreased energy 0  Change in appetite 0  Feeling bad or failure about yourself  0  Trouble concentrating 0  Moving slowly or fidgety/restless 0  Suicidal thoughts 0  PHQ-9 Score 1  Difficult doing work/chores Not difficult at all   Interpretation of Total Score  Total Score Depression Severity:  1-4 =  Minimal depression, 5-9 = Mild depression, 10-14 = Moderate depression, 15-19 = Moderately severe depression, 20-27 = Severe depression   Psychosocial Evaluation and Intervention:  Psychosocial Evaluation - 08/22/22 1419       Psychosocial Evaluation & Interventions   Interventions Encouraged to exercise with the program and follow exercise prescription;Stress management education;Relaxation education    Comments Pt has no identifiable psychosocial barriers to completing the PR program.  She is excited to start the program and improve her overall health.  She has a good support system in her husband, daughters, and grandchildren.  She scored a 1 on her PHQ-9 related to the fact that she has trouble falling asleep at times, but the pt does take a Xanax at night as needed to help her sleep and reduce anxiety.  Right now the pt feels like the Xanax is controlling her anxiety and insomnia.  She quit smoking in March of 2023 after smoking for 55 years.  She does have some lower back pain related to arthritis, and she is still having "heaviness" in her legs.  She is able to manage her back pain with Tylenol and stretching, and she hopes that once she loses some weight the leg "heaviness" will subside.  Her goals for the program are to breathe better with less shortness of breath and be able to walk/shop more.  We will monitor her progress as she works toward meeting these goals.    Expected Outcomes Pt's anxiety/insomnia will continue to be managed with Xanax, and she will continue to have no idenfiable psychosocial issues.    Continue Psychosocial Services  No Follow up required             Psychosocial Re-Evaluation:   Psychosocial Discharge (Final Psychosocial Re-Evaluation):    Education: Education Goals: Education classes will be provided on a weekly basis, covering required topics. Participant will state understanding/return demonstration of topics presented.  Learning  Barriers/Preferences:  Learning Barriers/Preferences - 08/22/22 1336       Learning Barriers/Preferences   Learning Barriers None    Learning Preferences Written Material             Education Topics: How Lungs Work and Diseases: - Discuss the anatomy of the lungs and diseases that can affect the lungs, such as COPD.   Exercise: -Discuss the importance of exercise, FITT principles of exercise, normal and abnormal responses to exercise, and how to exercise safely.   Environmental Irritants: -Discuss types of environmental irritants and how to limit exposure to environmental irritants.   Meds/Inhalers and oxygen: - Discuss respiratory medications, definition of an inhaler and oxygen, and the proper way to use an inhaler and oxygen.   Energy Saving Techniques: - Discuss methods to conserve energy and decrease shortness of breath when performing activities of daily living.    Bronchial Hygiene / Breathing Techniques: - Discuss breathing mechanics, pursed-lip breathing technique,  proper posture, effective ways to clear airways, and other functional breathing techniques   Cleaning Equipment: - Provides group verbal and written instruction about the health risks of elevated stress, cause  of high stress, and healthy ways to reduce stress.   Nutrition I: Fats: - Discuss the types of cholesterol, what cholesterol does to the body, and how cholesterol levels can be controlled.   Nutrition II: Labels: -Discuss the different components of food labels and how to read food labels.   Respiratory Infections: - Discuss the signs and symptoms of respiratory infections, ways to prevent respiratory infections, and the importance of seeking medical treatment when having a respiratory infection.   Stress I: Signs and Symptoms: - Discuss the causes of stress, how stress may lead to anxiety and depression, and ways to limit stress.   Stress II: Relaxation: -Discuss relaxation  techniques to limit stress.   Oxygen for Home/Travel: - Discuss how to prepare for travel when on oxygen and proper ways to transport and store oxygen to ensure safety.   Knowledge Questionnaire Score:  Knowledge Questionnaire Score - 08/22/22 1337       Knowledge Questionnaire Score   Pre Score 12/18             Core Components/Risk Factors/Patient Goals at Admission:  Personal Goals and Risk Factors at Admission - 08/22/22 1342       Core Components/Risk Factors/Patient Goals on Admission    Weight Management Weight Loss    Improve shortness of breath with ADL's Yes    Intervention Provide education, individualized exercise plan and daily activity instruction to help decrease symptoms of SOB with activities of daily living.    Expected Outcomes Short Term: Improve cardiorespiratory fitness to achieve a reduction of symptoms when performing ADLs;Long Term: Be able to perform more ADLs without symptoms or delay the onset of symptoms    Increase knowledge of respiratory medications and ability to use respiratory devices properly  Yes    Intervention Provide education and demonstration as needed of appropriate use of medications, inhalers, and oxygen therapy.    Expected Outcomes Short Term: Achieves understanding of medications use. Understands that oxygen is a medication prescribed by physician. Demonstrates appropriate use of inhaler and oxygen therapy.;Long Term: Maintain appropriate use of medications, inhalers, and oxygen therapy.    Hypertension Yes    Intervention Provide education on lifestyle modifcations including regular physical activity/exercise, weight management, moderate sodium restriction and increased consumption of fresh fruit, vegetables, and low fat dairy, alcohol moderation, and smoking cessation.    Expected Outcomes Short Term: Continued assessment and intervention until BP is < 140/37mm HG in hypertensive participants. < 130/11mm HG in hypertensive  participants with diabetes, heart failure or chronic kidney disease.;Long Term: Maintenance of blood pressure at goal levels.    Personal Goal Other Yes    Personal Goal Pt wants to breathe better with less shortness of breath and be able to walk more and shop.    Intervention Pt will attend PR twice a week and start a home exercise program.    Expected Outcomes Pt will attend PR twice a week and complete the program meeting both her personal and program goals.             Core Components/Risk Factors/Patient Goals Review:    Core Components/Risk Factors/Patient Goals at Discharge (Final Review):    ITP Comments:   Comments: Marland KitchenMarland KitchenPatient arrived for 1st visit/orientation/education at 12:30. Patient was referred to PR by Dr. Sherene Sires due to chronic respiratory failure with hypoxia (J96.11) and COPD mixed type (J44.9). During orientation advised patient on arrival and appointment times what to wear, what to do before, during and after exercise. Reviewed attendance  and class policy.  Pt is scheduled to return Pulmonary Rehab on 08/28/22 at 15:00. Pt was advised to come to class 15 minutes before class starts.  Discussed RPE/Dpysnea scales. Patient participated in warm up stretches. Patient was able to complete 6 minute walk test.  Patient was measured for the equipment. Discussed equipment safety with patient. Took patient pre-anthropometric measurements. Patient finished visit at 14:30.

## 2022-08-23 ENCOUNTER — Inpatient Hospital Stay: Payer: Medicare Other | Admitting: Physician Assistant

## 2022-08-23 ENCOUNTER — Other Ambulatory Visit: Payer: Self-pay

## 2022-08-23 ENCOUNTER — Encounter (HOSPITAL_COMMUNITY): Payer: Self-pay

## 2022-08-23 NOTE — Progress Notes (Signed)
Completed PAT phone call. Pt verbalized understanding of PAT instructions and arrival time for procedures.

## 2022-08-24 ENCOUNTER — Encounter (HOSPITAL_COMMUNITY)
Admission: RE | Admit: 2022-08-24 | Discharge: 2022-08-24 | Disposition: A | Discharge status: Home / Self Care | Payer: Medicare Other | Source: Ambulatory Visit | Attending: Internal Medicine | Admitting: Internal Medicine

## 2022-08-27 ENCOUNTER — Ambulatory Visit (HOSPITAL_COMMUNITY): Payer: Medicare Other | Admitting: Certified Registered Nurse Anesthetist

## 2022-08-27 ENCOUNTER — Encounter (HOSPITAL_COMMUNITY)
Admission: RE | Disposition: A | Discharge status: Home / Self Care | Payer: Self-pay | Source: Home / Self Care | Attending: Internal Medicine

## 2022-08-27 ENCOUNTER — Ambulatory Visit (HOSPITAL_BASED_OUTPATIENT_CLINIC_OR_DEPARTMENT_OTHER): Payer: Medicare Other | Admitting: Certified Registered Nurse Anesthetist

## 2022-08-27 ENCOUNTER — Encounter (HOSPITAL_COMMUNITY): Payer: Self-pay

## 2022-08-27 ENCOUNTER — Ambulatory Visit (HOSPITAL_COMMUNITY)
Admission: RE | Admit: 2022-08-27 | Discharge: 2022-08-27 | Disposition: A | Discharge status: Home / Self Care | Payer: Medicare Other | Attending: Internal Medicine | Admitting: Internal Medicine

## 2022-08-27 DIAGNOSIS — K449 Diaphragmatic hernia without obstruction or gangrene: Secondary | ICD-10-CM

## 2022-08-27 DIAGNOSIS — K2970 Gastritis, unspecified, without bleeding: Secondary | ICD-10-CM | POA: Diagnosis not present

## 2022-08-27 DIAGNOSIS — K649 Unspecified hemorrhoids: Secondary | ICD-10-CM | POA: Diagnosis not present

## 2022-08-27 DIAGNOSIS — K5790 Diverticulosis of intestine, part unspecified, without perforation or abscess without bleeding: Secondary | ICD-10-CM

## 2022-08-27 DIAGNOSIS — J449 Chronic obstructive pulmonary disease, unspecified: Secondary | ICD-10-CM | POA: Insufficient documentation

## 2022-08-27 DIAGNOSIS — Z7902 Long term (current) use of antithrombotics/antiplatelets: Secondary | ICD-10-CM | POA: Insufficient documentation

## 2022-08-27 DIAGNOSIS — Z7951 Long term (current) use of inhaled steroids: Secondary | ICD-10-CM | POA: Insufficient documentation

## 2022-08-27 DIAGNOSIS — Z86010 Personal history of colonic polyps: Secondary | ICD-10-CM | POA: Insufficient documentation

## 2022-08-27 DIAGNOSIS — K2270 Barrett's esophagus without dysplasia: Secondary | ICD-10-CM | POA: Diagnosis not present

## 2022-08-27 DIAGNOSIS — K2289 Other specified disease of esophagus: Secondary | ICD-10-CM | POA: Insufficient documentation

## 2022-08-27 DIAGNOSIS — K31819 Angiodysplasia of stomach and duodenum without bleeding: Secondary | ICD-10-CM

## 2022-08-27 DIAGNOSIS — Z87891 Personal history of nicotine dependence: Secondary | ICD-10-CM | POA: Insufficient documentation

## 2022-08-27 DIAGNOSIS — Z79899 Other long term (current) drug therapy: Secondary | ICD-10-CM | POA: Insufficient documentation

## 2022-08-27 DIAGNOSIS — K648 Other hemorrhoids: Secondary | ICD-10-CM | POA: Diagnosis not present

## 2022-08-27 DIAGNOSIS — K5730 Diverticulosis of large intestine without perforation or abscess without bleeding: Secondary | ICD-10-CM

## 2022-08-27 DIAGNOSIS — I10 Essential (primary) hypertension: Secondary | ICD-10-CM | POA: Diagnosis not present

## 2022-08-27 DIAGNOSIS — D509 Iron deficiency anemia, unspecified: Secondary | ICD-10-CM | POA: Diagnosis not present

## 2022-08-27 DIAGNOSIS — I2510 Atherosclerotic heart disease of native coronary artery without angina pectoris: Secondary | ICD-10-CM | POA: Diagnosis not present

## 2022-08-27 DIAGNOSIS — F419 Anxiety disorder, unspecified: Secondary | ICD-10-CM | POA: Diagnosis not present

## 2022-08-27 DIAGNOSIS — B9681 Helicobacter pylori [H. pylori] as the cause of diseases classified elsewhere: Secondary | ICD-10-CM | POA: Diagnosis not present

## 2022-08-27 HISTORY — PX: COLONOSCOPY WITH PROPOFOL: SHX5780

## 2022-08-27 HISTORY — PX: ESOPHAGOGASTRODUODENOSCOPY (EGD) WITH PROPOFOL: SHX5813

## 2022-08-27 HISTORY — PX: HOT HEMOSTASIS: SHX5433

## 2022-08-27 HISTORY — PX: BIOPSY: SHX5522

## 2022-08-27 SURGERY — COLONOSCOPY WITH PROPOFOL
Anesthesia: General

## 2022-08-27 MED ORDER — PROPOFOL 500 MG/50ML IV EMUL
INTRAVENOUS | Status: DC | PRN
  Administered 2022-08-27: 150 ug/kg/min via INTRAVENOUS

## 2022-08-27 MED ORDER — LACTATED RINGERS IV SOLN: INTRAVENOUS | Status: DC | PRN

## 2022-08-27 MED ORDER — PROPOFOL 10 MG/ML IV BOLUS
INTRAVENOUS | Status: DC | PRN
  Administered 2022-08-27: 30 mg via INTRAVENOUS
  Administered 2022-08-27 (×3): 20 mg via INTRAVENOUS
  Administered 2022-08-27: 100 mg via INTRAVENOUS
  Administered 2022-08-27: 50 mg via INTRAVENOUS

## 2022-08-27 MED ORDER — LIDOCAINE HCL (CARDIAC) PF 100 MG/5ML IV SOSY
PREFILLED_SYRINGE | INTRAVENOUS | Status: DC | PRN
  Administered 2022-08-27: 50 mg via INTRAVENOUS

## 2022-08-27 MED ORDER — STERILE WATER FOR IRRIGATION IR SOLN
Status: DC | PRN
  Administered 2022-08-27: 60 mL

## 2022-08-27 NOTE — Op Note (Signed)
[facility] Patient Name: Desiree Barber Procedure Date: 08/27/2022 8:56 AM MRN: [id number] Date of Birth: [birth year] Attending MD: Hennie Duos. Maple Mirza, [id number] CSN: [id number] Age: 72 Admit Type: Outpatient Procedure:                Colonoscopy Indications:              Iron deficiency anemia Providers:                Hennie Duos. Marletta Lor, DO, Angelica Ran, Pandora Leiter,                            Technician Referring MD:              Medicines:                See the Anesthesia note for documentation of the                            administered medications Complications:            No immediate complications. Estimated Blood Loss:     Estimated blood loss: none. Procedure:                Pre-Anesthesia Assessment:                           - The anesthesia plan was to use monitored                            anesthesia care (MAC).                           After obtaining informed consent, the colonoscope                            was passed under direct vision. Throughout the                            procedure, the patient's blood pressure, pulse, and                            oxygen saturations were monitored continuously. The                            [id number]-HQ190L ([id number]) scope was introduced through                            the anus and advanced to the the cecum, identified                            by appendiceal orifice and ileocecal valve. The                            colonoscopy was technically difficult and complex                            due to a redundant colon and significant looping.  Successful completion of the procedure was aided by                            applying abdominal pressure. The patient tolerated                            the procedure well. The quality of the bowel                            preparation was evaluated using the BBPS [facility]                            Bowel Preparation Scale) with scores  of: Right                            Colon = 2 (minor amount of residual staining, small                            fragments of stool and/or opaque liquid, but mucosa                            seen well), Transverse Colon = 2 (minor amount of                            residual staining, small fragments of stool and/or                            opaque liquid, but mucosa seen well) and Left Colon                            = 3 (entire mucosa seen well with no residual                            staining, small fragments of stool or opaque                            liquid). The total BBPS score equals 7. The quality                            of the bowel preparation was good. Scope In: 9:29:02 AM Scope Out: 9:44:45 AM Scope Withdrawal Time: 0 hours 6 minutes 26 seconds  Total Procedure Duration: 0 hours 15 minutes 43 seconds  Findings:      Non-bleeding internal hemorrhoids were found during endoscopy.      Multiple medium-mouthed and small-mouthed diverticula were found in the       entire colon.      The exam was otherwise without abnormality. Impression:               - Non-bleeding internal hemorrhoids.                           - Diverticulosis in the entire examined colon.                           -  The examination was otherwise normal.                           - No specimens collected. Moderate Sedation:      Per Anesthesia Care Recommendation:           - Patient has a contact number available for                            emergencies. The signs and symptoms of potential                            delayed complications were discussed with the                            patient. Return to normal activities tomorrow.                            Written discharge instructions were provided to the                            patient.                           - Resume previous diet.                           - Continue present medications.                           - Repeat  colonoscopy in 10 years for screening                            purposes.                           - Return to GI clinic in 3 months. Procedure Code(s):        --- Professional ---                           [phone], Colonoscopy, flexible; diagnostic, including                            collection of specimen(s) by brushing or washing,                            when performed (separate procedure) Diagnosis Code(s):        --- Professional ---                           K64.8, Other hemorrhoids                           D50.9, Iron deficiency anemia, unspecified                           K57.30, Diverticulosis of large intestine without  perforation or abscess without bleeding CPT copyright 2022 American Medical Association. All rights reserved. The codes documented in this report are preliminary and upon coder review may  be revised to meet current compliance requirements. Hennie Duos. Marletta Lor, DO Hennie Duos. Marletta Lor, DO 08/27/2022 9:48:13 AM This report has been signed electronically. Number of Addenda: 0

## 2022-08-27 NOTE — H&P (Signed)
Primary Care Physician:  Assunta Found, MD Primary Gastroenterologist:  Dr. Marletta Lor  Pre-Procedure History & Physical: HPI:  Desiree Barber is a 72 y.o. female is here for an EGD and colonoscopy to be performed for iron deficiency anemia, heme positive stool, previous NSAID use.   Past Medical History:  Diagnosis Date   Anemia    Anxiety    Occasional   Arthritis    COPD (chronic obstructive pulmonary disease) (HCC)    HTN (hypertension)    Iron deficiency anemia due to chronic blood loss 03/28/2022    Past Surgical History:  Procedure Laterality Date   ABDOMINAL AORTOGRAM W/LOWER EXTREMITY N/A 04/10/2022   Procedure: ABDOMINAL AORTOGRAM W/LOWER EXTREMITY;  Surgeon: Nada Libman, MD;  Location: [facility];  Service: Cardiovascular;  Laterality: N/A;   COLONOSCOPY  02/23/04   RMR: normal rectum and colon.minimal internal hemorrhoids   COLONOSCOPY WITH PROPOFOL N/A 06/13/2015   Procedure: COLONOSCOPY WITH PROPOFOL;  Surgeon: Corbin Ade, MD;  Location: [facility];  Service: Endoscopy;  Laterality: N/A;  1045   POLYPECTOMY  06/13/2015   Procedure: POLYPECTOMY;  Surgeon: Corbin Ade, MD;  Location: [facility];  Service: Endoscopy;;  sigmoid colon polyp   TOOTH EXTRACTION     TRIGGER FINGER RELEASE Bilateral    TUBAL LIGATION      Prior to Admission medications   Medication Sig Start Date End Date Taking? Authorizing Provider  ALPRAZolam Prudy Feeler) 0.25 MG tablet Take 0.25 mg by mouth daily as needed for anxiety or sleep. 06/12/21  Yes [provider]  Ascorbic Acid (VITAMIN C) 100 MG tablet Take 100 mg by mouth daily.   Yes [provider]  losartan (COZAAR) 25 MG tablet Take 1 tablet (25 mg total) by mouth daily. 02/19/22  Yes Meriam Sprague, MD  Na Sulfate-K Sulfate-Mg Sulf 17.5-3.13-1.6 GM/177ML SOLN As directed 07/30/22  Yes Earnest Bailey K, DO  pantoprazole (PROTONIX) 40 MG tablet Take 1 tablet (40 mg total) by mouth daily. 04/10/22   Yes Meriam Sprague, MD  TRELEGY ELLIPTA 100-62.5-25 MCG/ACT AEPB Inhale 1 puff into the lungs daily. 05/01/22  Yes [provider]  umeclidinium-vilanterol (ANORO ELLIPTA) 62.5-25 MCG/ACT AEPB Inhale 1 puff into the lungs daily. 01/05/22  Yes Nyoka Cowden, MD  vitamin B-12 (CYANOCOBALAMIN) 100 MCG tablet Take 100 mcg by mouth daily.   Yes [provider]  VITAMIN D, CHOLECALCIFEROL, PO Take 1 tablet by mouth daily. One daily   Yes [provider]  albuterol (PROVENTIL) (2.5 MG/3ML) 0.083% nebulizer solution Up to every 4 hours as needed 07/10/22   Nyoka Cowden, MD  albuterol (VENTOLIN HFA) 108 (90 Base) MCG/ACT inhaler Inhale into the lungs. Patient not taking: Reported on 08/22/2022 02/20/22   [provider]  clopidogrel (PLAVIX) 75 MG tablet Take 1 tablet (75 mg total) by mouth daily. 04/10/22   Nada Libman, MD    Allergies as of 07/30/2022 - Review Complete 07/16/2022  Allergen Reaction Noted   Demerol [meperidine] Other (See Comments) 05/03/2015   Oxycontin [oxycodone hcl] Other (See Comments) 05/03/2015   Fish allergy Nausea Only 06/13/2015   Lavender oil  02/28/2022   Other  02/28/2022    Family History  Problem Relation Age of Onset   Colon cancer Neg Hx    Breast cancer Neg Hx     Social History   Socioeconomic History   Marital status: Married    Spouse name: Not on file  Number of children: Not on file   Years of education: Not on file   Highest education level: Not on file  Occupational History   Not on file  Tobacco Use   Smoking status: Former    Packs/day: 0.50    Years: 55.00    Additional pack years: 0.00    Total pack years: 27.50    Types: Cigarettes    Quit date: 05/29/2021    Years since quitting: 1.2    Passive exposure: Past   Smokeless tobacco: Former   Tobacco comments:    Quit date 05/29/2021  Vaping Use   Vaping Use: Never used  Substance and Sexual Activity   Alcohol use: Yes    Alcohol/week:  0.0 standard drinks of alcohol    Comment: 3 beers a day   Drug use: No   Sexual activity: Yes    Birth control/protection: Surgical  Other Topics Concern   Not on file  Social History Narrative   Not on file   Social Determinants of Health   Financial Resource Strain: Not on file  Food Insecurity: Unknown (12/24/2021)   Hunger Vital Sign    Worried About Running Out of Food in the Last Year: Never true    Ran Out of Food in the Last Year: Not on file  Transportation Needs: No Transportation Needs (12/24/2021)   PRAPARE - Administrator, Civil Service (Medical): No    Lack of Transportation (Non-Medical): No  Physical Activity: Not on file  Stress: Not on file  Social Connections: Not on file  Intimate Partner Violence: Not At Risk (12/24/2021)   Humiliation, Afraid, Rape, and Kick questionnaire    Fear of Current or Ex-Partner: No    Emotionally Abused: No    Physically Abused: No    Sexually Abused: No    Review of Systems: General: Negative for fever, chills, fatigue, weakness. Eyes: Negative for vision changes.  ENT: Negative for hoarseness, difficulty swallowing , nasal congestion. CV: Negative for chest pain, angina, palpitations, dyspnea on exertion, peripheral edema.  Respiratory: Negative for dyspnea at rest, dyspnea on exertion, cough, sputum, wheezing.  GI: See history of present illness. GU:  Negative for dysuria, hematuria, urinary incontinence, urinary frequency, nocturnal urination.  MS: Negative for joint pain, low back pain.  Derm: Negative for rash or itching.  Neuro: Negative for weakness, abnormal sensation, seizure, frequent headaches, memory loss, confusion.  Psych: Negative for anxiety, depression Endo: Negative for unusual weight change.  Heme: Negative for bruising or bleeding. Allergy: Negative for rash or hives.  Physical Exam: Vital signs in last 24 hours: Temp:  [97.6 F (36.4 C)] 97.6 F (36.4 C) (06/03 0722) Pulse Rate:  [84]  84 (06/03 0722) Resp:  [16] 16 (06/03 0722) BP: (156)/(58) 156/58 (06/03 0722) SpO2:  [99 %] 99 % (06/03 0722) Weight:  [58 kg] 58 kg (06/03 0722)   General:   Alert,  Well-developed, well-nourished, pleasant and cooperative in NAD Head:  Normocephalic and atraumatic. Eyes:  Sclera clear, no icterus.   Conjunctiva pink. Ears:  Normal auditory acuity. Nose:  No deformity, discharge,  or lesions. Msk:  Symmetrical without gross deformities. Normal posture. Extremities:  Without clubbing or edema. Neurologic:  Alert and  oriented x4;  grossly normal neurologically. Skin:  Intact without significant lesions or rashes. Psych:  Alert and cooperative. Normal mood and affect.   Impression/Plan: Desiree Barber is here for an EGD and colonoscopy to be performed for iron deficiency anemia, heme  positive stool, previous NSAID use.   Risks, benefits, limitations, imponderables and alternatives regarding EGD have been reviewed with the patient. Questions have been answered. All parties agreeable.

## 2022-08-27 NOTE — Transfer of Care (Signed)
Immediate Anesthesia Transfer of Care Note  Patient: Desiree Barber  Procedure(s) Performed: COLONOSCOPY WITH PROPOFOL ESOPHAGOGASTRODUODENOSCOPY (EGD) WITH PROPOFOL BIOPSY HOT HEMOSTASIS (ARGON PLASMA COAGULATION/BICAP)  Patient Location: Short Stay  Anesthesia Type:General  Level of Consciousness: awake  Airway & Oxygen Therapy: Patient Spontanous Breathing  Post-op Assessment: Report given to RN  Post vital signs: Reviewed and stable  Last Vitals:  Vitals Value Taken Time  BP    Temp    Pulse    Resp    SpO2      Last Pain:  Vitals:   08/27/22 0946  TempSrc: Oral  PainSc: 0-No pain      Patients Stated Pain Goal: 0 (08/27/22 0722)  Complications: No notable events documented.

## 2022-08-27 NOTE — Anesthesia Preprocedure Evaluation (Signed)
Anesthesia Evaluation  Patient identified by MRN, date of birth, ID band Patient awake    Reviewed: Allergy & Precautions, H&P , NPO status , Patient's Chart, lab work & pertinent test results, reviewed documented beta blocker date and time   Airway Mallampati: II  TM Distance: >3 FB Neck ROM: full    Dental no notable dental hx.    Pulmonary neg pulmonary ROS, COPD, former smoker   Pulmonary exam normal breath sounds clear to auscultation       Cardiovascular Exercise Tolerance: Good hypertension, + CAD  negative cardio ROS  Rhythm:regular Rate:Normal     Neuro/Psych  PSYCHIATRIC DISORDERS Anxiety     negative neurological ROS  negative psych ROS   GI/Hepatic negative GI ROS, Neg liver ROS,,,  Endo/Other  negative endocrine ROS    Renal/GU negative Renal ROS  negative genitourinary   Musculoskeletal   Abdominal   Peds  Hematology negative hematology ROS (+) Blood dyscrasia, anemia   Anesthesia Other Findings   Reproductive/Obstetrics negative OB ROS                             Anesthesia Physical Anesthesia Plan  ASA: 3  Anesthesia Plan: General   Post-op Pain Management:    Induction:   PONV Risk Score and Plan: Propofol infusion  Airway Management Planned:   Additional Equipment:   Intra-op Plan:   Post-operative Plan:   Informed Consent: I have reviewed the patients History and Physical, chart, labs and discussed the procedure including the risks, benefits and alternatives for the proposed anesthesia with the patient or authorized representative who has indicated his/her understanding and acceptance.     Dental Advisory Given  Plan Discussed with: CRNA  Anesthesia Plan Comments:        Anesthesia Quick Evaluation

## 2022-08-27 NOTE — Anesthesia Postprocedure Evaluation (Signed)
Anesthesia Post Note  Patient: Desiree Barber  Procedure(s) Performed: COLONOSCOPY WITH PROPOFOL ESOPHAGOGASTRODUODENOSCOPY (EGD) WITH PROPOFOL BIOPSY HOT HEMOSTASIS (ARGON PLASMA COAGULATION/BICAP)  Patient location during evaluation: Short Stay Anesthesia Type: General Level of consciousness: awake and alert Pain management: pain level controlled Vital Signs Assessment: post-procedure vital signs reviewed and stable Respiratory status: spontaneous breathing Cardiovascular status: blood pressure returned to baseline and stable Postop Assessment: no apparent nausea or vomiting Anesthetic complications: no   No notable events documented.   Last Vitals:  Vitals:   08/27/22 0722 08/27/22 0946  BP: (!) 156/58 (!) 131/38  Pulse: 84 96  Resp: 16 (!) 27  Temp: 36.4 C (!) 36.4 C  SpO2: 99% 98%    Last Pain:  Vitals:   08/27/22 0946  TempSrc: Oral  PainSc: 0-No pain                 Agam Tuohy

## 2022-08-27 NOTE — Op Note (Signed)
[facility] Patient Name: Desiree Barber Procedure Date: 08/27/2022 8:57 AM MRN: [id number] Date of Birth: Nov 27, 1950 Attending MD: Hennie Duos. Maple Mirza, [id number] CSN: [id number] Age: 72 Admit Type: Outpatient Procedure:                Upper GI endoscopy Indications:              Iron deficiency anemia Providers:                Hennie Duos. Marletta Lor, DO, Angelica Ran, Pandora Leiter,                            Technician Referring MD:              Medicines:                See the Anesthesia note for documentation of the                            administered medications Complications:            No immediate complications. Estimated Blood Loss:     Estimated blood loss was minimal. Procedure:                Pre-Anesthesia Assessment:                           - The anesthesia plan was to use monitored                            anesthesia care (MAC).                           After obtaining informed consent, the endoscope was                            passed under direct vision. Throughout the                            procedure, the patient's blood pressure, pulse, and                            oxygen saturations were monitored continuously. The                            [id number]-H190 ([id number]) scope was introduced through the                            mouth, and advanced to the second part of duodenum.                            The upper GI endoscopy was accomplished without                            difficulty. The patient tolerated the procedure                            well. Scope In: 9:12:34 AM  Scope Out: 9:24:29 AM Total Procedure Duration: 0 hours 11 minutes 55 seconds  Findings:      A 2 cm hiatal hernia was present.      There were esophageal mucosal changes consistent with short-segment       Barrett's esophagus present in the distal esophagus. The maximum       longitudinal extent of these mucosal changes was 2 cm in length. Mucosa       was biopsied with a  cold forceps for histology. One specimen bottle was       sent to pathology.      Patchy mild inflammation characterized by erythema was found in the       gastric body and in the gastric antrum. Biopsies were taken with a cold       forceps for Helicobacter pylori testing.      Many (~10) small angiodysplastic lesions with no bleeding were found in       the gastric body. Coagulation for bleeding prevention using argon plasma       was successful.      The duodenal bulb, first portion of the duodenum and second portion of       the duodenum were normal. Impression:               - 2 cm hiatal hernia.                           - Esophageal mucosal changes consistent with                            short-segment Barrett's esophagus. Biopsied.                           - Gastritis. Biopsied.                           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 Treated with argon plasma coagulation                            ([facility]C).                           - Normal duodenal bulb, first portion of the                            duodenum and second portion of the duodenum. Moderate Sedation:      Per Anesthesia Care Recommendation:           - Patient has a contact number available for                            emergencies. The signs and symptoms of potential                            delayed complications were discussed with the  patient. Return to normal activities tomorrow.                            Written discharge instructions were provided to the                            patient.                           - Resume previous diet.                           - Continue present medications.                           - Await pathology results.                           - Repeat upper endoscopy in 5 years for                            surveillance.                           - Return to GI clinic in 3  months. Procedure Code(s):        --- Professional ---                           [phone], 59, Esophagogastroduodenoscopy, flexible,                            transoral; with control of bleeding, any method                           [id number], Esophagogastroduodenoscopy, flexible,                            transoral; with biopsy, single or multiple Diagnosis Code(s):        --- Professional ---                           K44.9, Diaphragmatic hernia without obstruction or                            gangrene                           K22.89, Other specified disease of esophagus                           K29.70, Gastritis, unspecified, without bleeding                           K31.819, Angiodysplasia of stomach and duodenum                            without bleeding  D50.9, Iron deficiency anemia, unspecified CPT copyright 2022 American Medical Association. All rights reserved. The codes documented in this report are preliminary and upon coder review may  be revised to meet current compliance requirements. Hennie Duos. Marletta Lor, DO Hennie Duos. Marletta Lor, DO 08/27/2022 9:28:19 AM This report has been signed electronically. Number of Addenda: 0

## 2022-08-27 NOTE — Discharge Instructions (Addendum)
EGD Discharge instructions Please read the instructions outlined below and refer to this sheet in the next few weeks. These discharge instructions provide you with general information on caring for yourself after you leave the hospital. Your doctor may also give you specific instructions. While your treatment has been planned according to the most current medical practices available, unavoidable complications occasionally occur. If you have any problems or questions after discharge, please call your doctor. ACTIVITY You may resume your regular activity but move at a slower pace for the next 24 hours.  Take frequent rest periods for the next 24 hours.  Walking will help expel (get rid of) the air and reduce the bloated feeling in your abdomen.  No driving for 24 hours (because of the anesthesia (medicine) used during the test).  You may shower.  Do not sign any important legal documents or operate any machinery for 24 hours (because of the anesthesia used during the test).  NUTRITION Drink plenty of fluids.  You may resume your normal diet.  Begin with a light meal and progress to your normal diet.  Avoid alcoholic beverages for 24 hours or as instructed by your caregiver.  MEDICATIONS You may resume your normal medications unless your caregiver tells you otherwise.  WHAT YOU CAN EXPECT TODAY You may experience abdominal discomfort such as a feeling of fullness or "gas" pains.  FOLLOW-UP Your doctor will discuss the results of your test with you.  SEEK IMMEDIATE MEDICAL ATTENTION IF ANY OF THE FOLLOWING OCCUR: Excessive nausea (feeling sick to your stomach) and/or vomiting.  Severe abdominal pain and distention (swelling).  Trouble swallowing.  Temperature over 101 F (37.8 C).  Rectal bleeding or vomiting of blood.     Colonoscopy Discharge Instructions  Read the instructions outlined below and refer to this sheet in the next few weeks. These discharge instructions provide you with  general information on caring for yourself after you leave the hospital. Your doctor may also give you specific instructions. While your treatment has been planned according to the most current medical practices available, unavoidable complications occasionally occur.   ACTIVITY You may resume your regular activity, but move at a slower pace for the next 24 hours.  Take frequent rest periods for the next 24 hours.  Walking will help get rid of the air and reduce the bloated feeling in your belly (abdomen).  No driving for 24 hours (because of the medicine (anesthesia) used during the test).   Do not sign any important legal documents or operate any machinery for 24 hours (because of the anesthesia used during the test).  NUTRITION Drink plenty of fluids.  You may resume your normal diet as instructed by your doctor.  Begin with a light meal and progress to your normal diet. Heavy or fried foods are harder to digest and may make you feel sick to your stomach (nauseated).  Avoid alcoholic beverages for 24 hours or as instructed.  MEDICATIONS You may resume your normal medications unless your doctor tells you otherwise.  WHAT YOU CAN EXPECT TODAY Some feelings of bloating in the abdomen.  Passage of more gas than usual.  Spotting of blood in your stool or on the toilet paper.  IF YOU HAD POLYPS REMOVED DURING THE COLONOSCOPY: No aspirin products for 7 days or as instructed.  No alcohol for 7 days or as instructed.  Eat a soft diet for the next 24 hours.  FINDING OUT THE RESULTS OF YOUR TEST Not all test results are  available during your visit. If your test results are not back during the visit, make an appointment with your caregiver to find out the results. Do not assume everything is normal if you have not heard from your caregiver or the medical facility. It is important for you to follow up on all of your test results.  SEEK IMMEDIATE MEDICAL ATTENTION IF: You have more than a spotting of  blood in your stool.  Your belly is swollen (abdominal distention).  You are nauseated or vomiting.  You have a temperature over 101.  You have abdominal pain or discomfort that is severe or gets worse throughout the day.   Your EGD revealed mild amount inflammation in your stomach.  I took biopsies of this to rule out infection with a bacteria called H. pylori.  Await pathology results, my office will contact you.  You have a small hiatal hernia.  Also had evidence of mild Barrett's esophagus which I took samples of today.  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  These are small blood vessels that can leak blood.  I treated all of them with thermal therapy today.  Small bowel appeared normal.  Continue on pantoprazole.  Your colonoscopy was relatively unremarkable.  I did not find any polyps or evidence of colon cancer.  I recommend repeating colonoscopy in 10 years for colon cancer screening purposes.  You do have diverticulosis and internal hemorrhoids. I would recommend increasing fiber in your diet or adding OTC Benefiber/Metamucil. Be sure to drink at least 4 to 6 glasses of water daily.   Follow-up with GI in 3 months  I hope you have a great rest of your week!  Hennie Duos. Marletta Lor, D.O. Gastroenterology and Hepatology [facility]

## 2022-08-28 ENCOUNTER — Encounter (HOSPITAL_COMMUNITY)
Admission: RE | Admit: 2022-08-28 | Discharge: 2022-08-28 | Disposition: A | Discharge status: Home / Self Care | Payer: Medicare Other | Source: Ambulatory Visit | Attending: Internal Medicine | Admitting: Internal Medicine

## 2022-08-28 ENCOUNTER — Inpatient Hospital Stay: Payer: Medicare Other | Attending: Hematology

## 2022-08-28 VITALS — Wt 127.0 lb

## 2022-08-28 DIAGNOSIS — J9611 Chronic respiratory failure with hypoxia: Secondary | ICD-10-CM | POA: Insufficient documentation

## 2022-08-28 DIAGNOSIS — D500 Iron deficiency anemia secondary to blood loss (chronic): Secondary | ICD-10-CM

## 2022-08-28 DIAGNOSIS — D509 Iron deficiency anemia, unspecified: Secondary | ICD-10-CM | POA: Insufficient documentation

## 2022-08-28 DIAGNOSIS — D75839 Thrombocytosis, unspecified: Secondary | ICD-10-CM | POA: Diagnosis not present

## 2022-08-28 DIAGNOSIS — J449 Chronic obstructive pulmonary disease, unspecified: Secondary | ICD-10-CM | POA: Insufficient documentation

## 2022-08-28 DIAGNOSIS — D72829 Elevated white blood cell count, unspecified: Secondary | ICD-10-CM | POA: Diagnosis not present

## 2022-08-28 DIAGNOSIS — D649 Anemia, unspecified: Secondary | ICD-10-CM

## 2022-08-28 LAB — COMPREHENSIVE METABOLIC PANEL
ALT: 14 U/L (ref 0–44)
AST: 19 U/L (ref 15–41)
Albumin: 3.5 g/dL (ref 3.5–5.0)
Alkaline Phosphatase: 82 U/L (ref 38–126)
Anion gap: 9 (ref 5–15)
BUN: 9 mg/dL (ref 8–23)
CO2: 26 mmol/L (ref 22–32)
Calcium: 8.5 mg/dL — ABNORMAL LOW (ref 8.9–10.3)
Chloride: 98 mmol/L (ref 98–111)
Creatinine, Ser: 0.97 mg/dL (ref 0.44–1.00)
GFR, Estimated: >60 mL/min (ref >60)
Glucose, Bld: 106 mg/dL — ABNORMAL HIGH (ref 70–99)
Potassium: 3.4 mmol/L — ABNORMAL LOW (ref 3.5–5.1)
Sodium: 133 mmol/L — ABNORMAL LOW (ref 135–145)
Total Bilirubin: 0.5 mg/dL (ref 0.3–1.2)
Total Protein: 7.1 g/dL (ref 6.5–8.1)

## 2022-08-28 LAB — IRON AND TIBC
Iron: 14 ug/dL — ABNORMAL LOW (ref 28–170)
Saturation Ratios: 4 % — ABNORMAL LOW (ref 10.4–31.8)
TIBC: 369 ug/dL (ref 250–450)
UIBC: 355 ug/dL

## 2022-08-28 LAB — CBC WITH DIFFERENTIAL/PLATELET
Abs Immature Granulocytes: 0.06 10*3/uL (ref 0.00–0.07)
Basophils Absolute: 0 10*3/uL (ref 0.0–0.1)
Basophils Relative: 0 %
Eosinophils Absolute: 0 10*3/uL (ref 0.0–0.5)
Eosinophils Relative: 0 %
HCT: 36.2 % (ref 36.0–46.0)
Hemoglobin: 10.7 g/dL — ABNORMAL LOW (ref 12.0–15.0)
Immature Granulocytes: 1 %
Lymphocytes Relative: 9 %
Lymphs Abs: 1.1 10*3/uL (ref 0.7–4.0)
MCH: 29.4 pg (ref 26.0–34.0)
MCHC: 29.6 g/dL — ABNORMAL LOW (ref 30.0–36.0)
MCV: 99.5 fL (ref 80.0–100.0)
Monocytes Absolute: 1.2 10*3/uL — ABNORMAL HIGH (ref 0.1–1.0)
Monocytes Relative: 10 %
Neutro Abs: 10.2 10*3/uL — ABNORMAL HIGH (ref 1.7–7.7)
Neutrophils Relative %: 80 %
Platelets: 405 10*3/uL — ABNORMAL HIGH (ref 150–400)
RBC: 3.64 MIL/uL — ABNORMAL LOW (ref 3.87–5.11)
RDW: 16.7 % — ABNORMAL HIGH (ref 11.5–15.5)
WBC: 12.7 10*3/uL — ABNORMAL HIGH (ref 4.0–10.5)
nRBC: 0 % (ref 0.0–0.2)

## 2022-08-28 LAB — FERRITIN: Ferritin: 23 ng/mL (ref 11–307)

## 2022-08-28 NOTE — Progress Notes (Signed)
Daily Session Note  Patient Details  Name: Desiree Barber MRN: [id number] Date of Birth: [birth year] Referring Provider:   Flowsheet Row PULMONARY REHAB OTHER RESP ORIENTATION from 08/22/2022 in [facility] CARDIAC REHABILITATION  Referring Provider Dr. Sherene Sires       Encounter Date: 08/28/2022  Check In:  Session Check In - 08/28/22 1518       Check-In   Supervising physician immediately available to respond to emergencies --    Physician(s) --    Location --    Staff Present --    Virtual Visit --    Medication changes reported     --    Fall or balance concerns reported    --    Tobacco Cessation --    Warm-up and Cool-down --    Resistance Training Performed --    VAD Patient? --      Pain Assessment   Currently in Pain? --    Pain Score --             Capillary Blood Glucose: Results for orders placed or performed in visit on 08/28/22 (from the past 24 hour(s))  Iron and TIBC     Status: Abnormal   Collection Time: 08/28/22  9:08 AM  Result Value Ref Range   Iron 14 (L) 28 - 170 ug/dL   TIBC 213 086 - 578 ug/dL   Saturation Ratios 4 (L) 10.4 - 31.8 %   UIBC 355 ug/dL  Ferritin     Status: None   Collection Time: 08/28/22  9:08 AM  Result Value Ref Range   Ferritin 23 11 - 307 ng/mL  Comprehensive metabolic panel     Status: Abnormal   Collection Time: 08/28/22  9:08 AM  Result Value Ref Range   Sodium 133 (L) 135 - 145 mmol/L   Potassium 3.4 (L) 3.5 - 5.1 mmol/L   Chloride 98 98 - 111 mmol/L   CO2 26 22 - 32 mmol/L   Glucose, Bld 106 (H) 70 - 99 mg/dL   BUN 9 8 - 23 mg/dL   Creatinine, Ser 4.69 0.44 - 1.00 mg/dL   Calcium 8.5 (L) 8.9 - 10.3 mg/dL   Total Protein 7.1 6.5 - 8.1 g/dL   Albumin 3.5 3.5 - 5.0 g/dL   AST 19 15 - 41 U/L   ALT 14 0 - 44 U/L   Alkaline Phosphatase 82 38 - 126 U/L   Total Bilirubin 0.5 0.3 - 1.2 mg/dL   GFR, Estimated >62 >95 mL/min   Anion gap 9 5 - 15  CBC with Differential/Platelet     Status: Abnormal   Collection  Time: 08/28/22  9:08 AM  Result Value Ref Range   WBC 12.7 (H) 4.0 - 10.5 K/uL   RBC 3.64 (L) 3.87 - 5.11 MIL/uL   Hemoglobin 10.7 (L) 12.0 - 15.0 g/dL   HCT 28.4 13.2 - 44.0 %   MCV 99.5 80.0 - 100.0 fL   MCH 29.4 26.0 - 34.0 pg   MCHC 29.6 (L) 30.0 - 36.0 g/dL   RDW 10.2 (H) 72.5 - 36.6 %   Platelets 405 (H) 150 - 400 K/uL   nRBC 0.0 0.0 - 0.2 %   Neutrophils Relative % 80 %   Neutro Abs 10.2 (H) 1.7 - 7.7 K/uL   Lymphocytes Relative 9 %   Lymphs Abs 1.1 0.7 - 4.0 K/uL   Monocytes Relative 10 %   Monocytes Absolute 1.2 (H) 0.1 - 1.0 K/uL  Eosinophils Relative 0 %   Eosinophils Absolute 0.0 0.0 - 0.5 K/uL   Basophils Relative 0 %   Basophils Absolute 0.0 0.0 - 0.1 K/uL   Immature Granulocytes 1 %   Abs Immature Granulocytes 0.06 0.00 - 0.07 K/uL      Social History   Tobacco Use  Smoking Status Former   Packs/day: 0.50   Years: 55.00   Additional pack years: 0.00   Total pack years: 27.50   Types: Cigarettes   Quit date: 05/29/2021   Years since quitting: 1.2   Passive exposure: Past  Smokeless Tobacco Former  Tobacco Comments   Quit date 05/29/2021    Goals Met:  Proper associated with RPD/PD & O2 Sat Independence with exercise equipment Using PLB without cueing & demonstrates good technique Exercise tolerated well Queuing for purse lip breathing No report of concerns or symptoms today Strength training completed today  Goals Unmet:  Not Applicable  Comments: Checkout at 1600.   Dr. Erick Blinks is Medical Director for [facility] Pulmonary Rehab.

## 2022-08-28 NOTE — Progress Notes (Signed)
Daily Session Note  Patient Details  Name: JULIAHNA MORAGNE MRN: [id number] Date of Birth: 1950/12/09 Referring Provider:   Flowsheet Row PULMONARY REHAB OTHER RESP ORIENTATION from 08/22/2022 in [facility] CARDIAC REHABILITATION  Referring Provider Dr. Sherene Sires       Encounter Date: 08/28/2022  Check In:  Session Check In - 08/28/22 1518       Check-In   Supervising physician immediately available to respond to emergencies [facility] MD immediately available    Physician(s) Dr Jenene Slicker    Location [facility]    Staff Present Ross Ludwig, BS, Exercise Physiologist;Cristian Davitt Daphine Deutscher, RN, BSN;Phyllis Billingsley, RN    Virtual Visit No    Medication changes reported     No    Fall or balance concerns reported    No    Tobacco Cessation No Change    Warm-up and Cool-down Performed as group-led instruction    Resistance Training Performed Yes    VAD Patient? No      Pain Assessment   Currently in Pain? No/denies    Pain Score 0-No pain             Capillary Blood Glucose: Results for orders placed or performed in visit on 08/28/22 (from the past 24 hour(s))  Iron and TIBC     Status: Abnormal   Collection Time: 08/28/22  9:08 AM  Result Value Ref Range   Iron 14 (L) 28 - 170 ug/dL   TIBC 409 811 - 914 ug/dL   Saturation Ratios 4 (L) 10.4 - 31.8 %   UIBC 355 ug/dL  Ferritin     Status: None   Collection Time: 08/28/22  9:08 AM  Result Value Ref Range   Ferritin 23 11 - 307 ng/mL  Comprehensive metabolic panel     Status: Abnormal   Collection Time: 08/28/22  9:08 AM  Result Value Ref Range   Sodium 133 (L) 135 - 145 mmol/L   Potassium 3.4 (L) 3.5 - 5.1 mmol/L   Chloride 98 98 - 111 mmol/L   CO2 26 22 - 32 mmol/L   Glucose, Bld 106 (H) 70 - 99 mg/dL   BUN 9 8 - 23 mg/dL   Creatinine, Ser 7.82 0.44 - 1.00 mg/dL   Calcium 8.5 (L) 8.9 - 10.3 mg/dL   Total Protein 7.1 6.5 - 8.1 g/dL   Albumin 3.5 3.5 - 5.0 g/dL   AST 19 15 - 41 U/L   ALT 14 0 - 44 U/L    Alkaline Phosphatase 82 38 - 126 U/L   Total Bilirubin 0.5 0.3 - 1.2 mg/dL   GFR, Estimated >95 >62 mL/min   Anion gap 9 5 - 15  CBC with Differential/Platelet     Status: Abnormal   Collection Time: 08/28/22  9:08 AM  Result Value Ref Range   WBC 12.7 (H) 4.0 - 10.5 K/uL   RBC 3.64 (L) 3.87 - 5.11 MIL/uL   Hemoglobin 10.7 (L) 12.0 - 15.0 g/dL   HCT 13.0 86.5 - 78.4 %   MCV 99.5 80.0 - 100.0 fL   MCH 29.4 26.0 - 34.0 pg   MCHC 29.6 (L) 30.0 - 36.0 g/dL   RDW 69.6 (H) 29.5 - 28.4 %   Platelets 405 (H) 150 - 400 K/uL   nRBC 0.0 0.0 - 0.2 %   Neutrophils Relative % 80 %   Neutro Abs 10.2 (H) 1.7 - 7.7 K/uL   Lymphocytes Relative 9 %   Lymphs Abs 1.1 0.7 -  4.0 K/uL   Monocytes Relative 10 %   Monocytes Absolute 1.2 (H) 0.1 - 1.0 K/uL   Eosinophils Relative 0 %   Eosinophils Absolute 0.0 0.0 - 0.5 K/uL   Basophils Relative 0 %   Basophils Absolute 0.0 0.0 - 0.1 K/uL   Immature Granulocytes 1 %   Abs Immature Granulocytes 0.06 0.00 - 0.07 K/uL      Social History   Tobacco Use  Smoking Status Former   Packs/day: 0.50   Years: 55.00   Additional pack years: 0.00   Total pack years: 27.50   Types: Cigarettes   Quit date: 05/29/2021   Years since quitting: 1.2   Passive exposure: Past  Smokeless Tobacco Former  Tobacco Comments   Quit date 05/29/2021    Goals Met:  Proper associated with RPD/PD & O2 Sat Independence with exercise equipment Using PLB without cueing & demonstrates good technique Exercise tolerated well Queuing for purse lip breathing No report of concerns or symptoms today Strength training completed today  Goals Unmet:  Not Applicable  Comments: Checkout at 1600.   Dr. Erick Blinks is Medical Director for [facility] Pulmonary Rehab.

## 2022-08-29 LAB — SURGICAL PATHOLOGY

## 2022-08-30 ENCOUNTER — Encounter (HOSPITAL_COMMUNITY)
Admission: RE | Admit: 2022-08-30 | Discharge: 2022-08-30 | Disposition: A | Discharge status: Home / Self Care | Payer: Medicare Other | Source: Ambulatory Visit | Attending: Internal Medicine | Admitting: Internal Medicine

## 2022-08-30 DIAGNOSIS — J449 Chronic obstructive pulmonary disease, unspecified: Secondary | ICD-10-CM | POA: Insufficient documentation

## 2022-08-30 DIAGNOSIS — J9611 Chronic respiratory failure with hypoxia: Secondary | ICD-10-CM | POA: Diagnosis not present

## 2022-08-30 NOTE — Progress Notes (Signed)
Daily Session Note  Patient Details  Name: Desiree Barber MRN: [id number] Date of Birth: [birth year] Referring Provider:   Flowsheet Row PULMONARY REHAB OTHER RESP ORIENTATION from 08/22/2022 in [facility] CARDIAC REHABILITATION  Referring Provider Dr. Sherene Barber       Encounter Date: 08/30/2022  Check In:  Session Check In - 08/30/22 1442       Check-In   Supervising physician immediately available to respond to emergencies [facility] MD immediately available    Physician(s) Dr Jenene Slicker    Location [facility]    Staff Present Ross Ludwig, BS, Exercise Physiologist;Lamaria Hildebrandt Daphine Deutscher, RN, BSN    Virtual Visit No    Medication changes reported     No    Fall or balance concerns reported    No    Tobacco Cessation No Change    Warm-up and Cool-down Performed as group-led instruction    Resistance Training Performed Yes      Pain Assessment   Currently in Pain? No/denies    Pain Score 0-No pain             Capillary Blood Glucose: No results found for this or any previous visit (from the past 24 hour(s)).    Social History   Tobacco Use  Smoking Status Former   Packs/day: 0.50   Years: 55.00   Additional pack years: 0.00   Total pack years: 27.50   Types: Cigarettes   Quit date: 05/29/2021   Years since quitting: 1.2   Passive exposure: Past  Smokeless Tobacco Former  Tobacco Comments   Quit date 05/29/2021    Goals Met:  Proper associated with RPD/PD & O2 Sat Independence with exercise equipment Using PLB without cueing & demonstrates good technique Exercise tolerated well Queuing for purse lip breathing No report of concerns or symptoms today Strength training completed today  Goals Unmet:  Not Applicable  Comments: Checkout at 1600.   Dr. Erick Blinks is Medical Director for [facility] Pulmonary Rehab.

## 2022-09-03 ENCOUNTER — Encounter (HOSPITAL_COMMUNITY): Payer: Self-pay | Admitting: Internal Medicine

## 2022-09-03 NOTE — Progress Notes (Deleted)
NO SHOW

## 2022-09-04 ENCOUNTER — Encounter (HOSPITAL_COMMUNITY)
Admission: RE | Admit: 2022-09-04 | Discharge: 2022-09-04 | Disposition: A | Discharge status: Home / Self Care | Payer: Medicare Other | Source: Ambulatory Visit | Attending: Internal Medicine | Admitting: Internal Medicine

## 2022-09-04 ENCOUNTER — Inpatient Hospital Stay: Payer: Medicare Other | Admitting: Physician Assistant

## 2022-09-04 DIAGNOSIS — J449 Chronic obstructive pulmonary disease, unspecified: Secondary | ICD-10-CM

## 2022-09-04 DIAGNOSIS — J9611 Chronic respiratory failure with hypoxia: Secondary | ICD-10-CM | POA: Diagnosis not present

## 2022-09-04 NOTE — Progress Notes (Signed)
Daily Session Note  Patient Details  Name: Desiree Barber MRN: [id number] Date of Birth: 01/13/1951 Referring Provider:   Flowsheet Row PULMONARY REHAB OTHER RESP ORIENTATION from 08/22/2022 in [facility] CARDIAC REHABILITATION  Referring Provider Dr. Sherene Sires       Encounter Date: 09/04/2022  Check In:  Session Check In - 09/04/22 1500       Check-In   Supervising physician immediately available to respond to emergencies [facility] MD immediately available    Physician(s) Dr. Wyline Mood    Location [facility]    Staff Present Erskine Speed, RN;Daphyne Daphine Deutscher, RN, BSN    Virtual Visit No    Medication changes reported     No    Fall or balance concerns reported    No    Tobacco Cessation No Change    Warm-up and Cool-down Performed as group-led instruction    Resistance Training Performed Yes    VAD Patient? No    PAD/SET Patient? No      Pain Assessment   Currently in Pain? No/denies    Pain Score 0-No pain    Multiple Pain Sites No             Capillary Blood Glucose: No results found for this or any previous visit (from the past 24 hour(s)).    Social History   Tobacco Use  Smoking Status Former   Packs/day: 0.50   Years: 55.00   Additional pack years: 0.00   Total pack years: 27.50   Types: Cigarettes   Quit date: 05/29/2021   Years since quitting: 1.2   Passive exposure: Past  Smokeless Tobacco Former  Tobacco Comments   Quit date 05/29/2021    Goals Met:  Independence with exercise equipment Using PLB without cueing & demonstrates good technique Exercise tolerated well No report of concerns or symptoms today Strength training completed today  Goals Unmet:  Not Applicable  Comments: check out @ 4:00pm   Dr. Erick Blinks is Medical Director for [facility] Pulmonary Rehab.

## 2022-09-06 ENCOUNTER — Other Ambulatory Visit: Payer: Self-pay | Admitting: Internal Medicine

## 2022-09-06 ENCOUNTER — Encounter (HOSPITAL_COMMUNITY)
Admission: RE | Admit: 2022-09-06 | Discharge: 2022-09-06 | Disposition: A | Discharge status: Home / Self Care | Payer: Medicare Other | Source: Ambulatory Visit | Attending: Internal Medicine | Admitting: Internal Medicine

## 2022-09-06 ENCOUNTER — Encounter: Payer: Self-pay | Admitting: *Deleted

## 2022-09-06 DIAGNOSIS — J9611 Chronic respiratory failure with hypoxia: Secondary | ICD-10-CM | POA: Diagnosis not present

## 2022-09-06 DIAGNOSIS — R0609 Other forms of dyspnea: Secondary | ICD-10-CM

## 2022-09-06 DIAGNOSIS — J449 Chronic obstructive pulmonary disease, unspecified: Secondary | ICD-10-CM | POA: Diagnosis not present

## 2022-09-06 DIAGNOSIS — I2510 Atherosclerotic heart disease of native coronary artery without angina pectoris: Secondary | ICD-10-CM

## 2022-09-06 DIAGNOSIS — I700 Atherosclerosis of aorta: Secondary | ICD-10-CM

## 2022-09-06 MED ORDER — METRONIDAZOLE 500 MG PO TABS: 500.0000 mg | ORAL_TABLET | Freq: Three times a day (TID) | ORAL | 0 refills | Status: AC

## 2022-09-06 MED ORDER — BISMUTH 262 MG PO CHEW: 2.0000 | CHEWABLE_TABLET | Freq: Four times a day (QID) | ORAL | 0 refills | Status: AC

## 2022-09-06 MED ORDER — PANTOPRAZOLE SODIUM 40 MG PO TBEC
40.0000 mg | DELAYED_RELEASE_TABLET | Freq: Two times a day (BID) | ORAL | 11 refills | Status: DC
Start: 2022-09-06 — End: 2023-10-07

## 2022-09-06 MED ORDER — TETRACYCLINE HCL 500 MG PO CAPS: 500.0000 mg | ORAL_CAPSULE | Freq: Four times a day (QID) | ORAL | 0 refills | Status: AC

## 2022-09-06 NOTE — Progress Notes (Signed)
Daily Session Note  Patient Details  Name: Desiree Barber MRN: [id number] Date of Birth: 1950-04-24 Referring Provider:   Flowsheet Row PULMONARY REHAB OTHER RESP ORIENTATION from 08/22/2022 in [facility] CARDIAC REHABILITATION  Referring Provider Dr. Sherene Sires       Encounter Date: 09/06/2022  Check In:  Session Check In - 09/06/22 1447       Check-In   Supervising physician immediately available to respond to emergencies [facility] MD immediately available    Physician(s) Dr. Wyline Mood    Location [facility]    Staff Present Ross Ludwig, BS, Exercise Physiologist;Kamden Stanislaw Daphine Deutscher, RN, BSN;Hillary Troutman BSN, RN    Virtual Visit No    Medication changes reported     No    Fall or balance concerns reported    No    Tobacco Cessation No Change    Warm-up and Cool-down Performed as group-led Writer Performed Yes      Pain Assessment   Currently in Pain? No/denies    Pain Score 0-No pain    Multiple Pain Sites No             Capillary Blood Glucose: No results found for this or any previous visit (from the past 24 hour(s)).    Social History   Tobacco Use  Smoking Status Former   Packs/day: 0.50   Years: 55.00   Additional pack years: 0.00   Total pack years: 27.50   Types: Cigarettes   Quit date: 05/29/2021   Years since quitting: 1.2   Passive exposure: Past  Smokeless Tobacco Former  Tobacco Comments   Quit date 05/29/2021    Goals Met:  Proper associated with RPD/PD & O2 Sat Independence with exercise equipment Using PLB without cueing & demonstrates good technique Exercise tolerated well Queuing for purse lip breathing No report of concerns or symptoms today Strength training completed today  Goals Unmet:  Not Applicable  Comments: Checkout at 1600.   Dr. Erick Blinks is Medical Director for [facility] Pulmonary Rehab.

## 2022-09-11 ENCOUNTER — Encounter (HOSPITAL_COMMUNITY)
Admission: RE | Admit: 2022-09-11 | Discharge: 2022-09-11 | Disposition: A | Discharge status: Home / Self Care | Payer: Medicare Other | Source: Ambulatory Visit | Attending: Internal Medicine | Admitting: Internal Medicine

## 2022-09-11 DIAGNOSIS — J449 Chronic obstructive pulmonary disease, unspecified: Secondary | ICD-10-CM | POA: Diagnosis not present

## 2022-09-11 DIAGNOSIS — J9611 Chronic respiratory failure with hypoxia: Secondary | ICD-10-CM

## 2022-09-11 NOTE — Progress Notes (Signed)
Daily Session Note  Patient Details  Name: LIYAT POKORNEY MRN: [id number] Date of Birth: Oct 20, 1950 Referring Provider:   Flowsheet Row PULMONARY REHAB OTHER RESP ORIENTATION from 08/22/2022 in [facility] CARDIAC REHABILITATION  Referring Provider Dr. Sherene Sires       Encounter Date: 09/11/2022  Check In:  Session Check In - 09/11/22 1500       Check-In   Supervising physician immediately available to respond to emergencies [facility] MD immediately available    Physician(s) Dr Diona Browner    Location [facility]    Staff Present Ross Ludwig, BS, Exercise Physiologist;Danny Gala Romney, RN, BSN    Virtual Visit No    Medication changes reported     No    Fall or balance concerns reported    No    Tobacco Cessation No Change    Warm-up and Cool-down Performed as group-led instruction    Resistance Training Performed Yes      Pain Assessment   Currently in Pain? No/denies    Pain Score 0-No pain             Capillary Blood Glucose: No results found for this or any previous visit (from the past 24 hour(s)).    Social History   Tobacco Use  Smoking Status Former   Packs/day: 0.50   Years: 55.00   Additional pack years: 0.00   Total pack years: 27.50   Types: Cigarettes   Quit date: 05/29/2021   Years since quitting: 1.2   Passive exposure: Past  Smokeless Tobacco Former  Tobacco Comments   Quit date 05/29/2021    Goals Met:  Proper associated with RPD/PD & O2 Sat Independence with exercise equipment Using PLB without cueing & demonstrates good technique Exercise tolerated well Queuing for purse lip breathing No report of concerns or symptoms today Strength training completed today  Goals Unmet:  Not Applicable  Comments: Checkout at 1600.   Dr. Erick Blinks is Medical Director for [facility] Pulmonary Rehab.

## 2022-09-12 NOTE — Progress Notes (Signed)
Pulmonary Individual Treatment Plan  Patient Details  Name: Desiree Barber MRN: [id number] Date of Birth: Jun 16, 1950 Referring Provider:   Flowsheet Row PULMONARY REHAB OTHER RESP ORIENTATION from 08/22/2022 in [facility] CARDIAC REHABILITATION  Referring Provider Dr. Sherene Sires       Initial Encounter Date:  Flowsheet Row PULMONARY REHAB OTHER RESP ORIENTATION from 08/22/2022 in [facility] CARDIAC REHABILITATION  Date 08/22/22       Visit Diagnosis: Chronic respiratory failure with hypoxia (HCC)  COPD mixed type (HCC)  Patient's Home Medications on Admission:   Current Outpatient Medications:    albuterol (PROVENTIL) (2.5 MG/3ML) 0.083% nebulizer solution, Up to every 4 hours as needed, Disp: 75 mL, Rfl: 12   albuterol (VENTOLIN HFA) 108 (90 Base) MCG/ACT inhaler, Inhale into the lungs. (Patient not taking: Reported on 08/22/2022), Disp: , Rfl:    ALPRAZolam (XANAX) 0.25 MG tablet, Take 0.25 mg by mouth daily as needed for anxiety or sleep., Disp: , Rfl:    Ascorbic Acid (VITAMIN C) 100 MG tablet, Take 100 mg by mouth daily., Disp: , Rfl:    Bismuth 262 MG CHEW, Chew 2 tablets by mouth in the morning, at noon, in the evening, and at bedtime for 14 days., Disp: 112 tablet, Rfl: 0   clopidogrel (PLAVIX) 75 MG tablet, Take 1 tablet (75 mg total) by mouth daily., Disp: 30 tablet, Rfl: 11   losartan (COZAAR) 25 MG tablet, Take 1 tablet (25 mg total) by mouth daily., Disp: 90 tablet, Rfl: 2   metroNIDAZOLE (FLAGYL) 500 MG tablet, Take 1 tablet (500 mg total) by mouth 3 (three) times daily for 14 days., Disp: 42 tablet, Rfl: 0   pantoprazole (PROTONIX) 40 MG tablet, Take 1 tablet (40 mg total) by mouth 2 (two) times daily., Disp: 60 tablet, Rfl: 11   tetracycline (SUMYCIN) 500 MG capsule, Take 1 capsule (500 mg total) by mouth 4 (four) times daily for 14 days., Disp: 56 capsule, Rfl: 0   TRELEGY ELLIPTA 100-62.5-25 MCG/ACT AEPB, Inhale 1 puff into the lungs daily., Disp: , Rfl:     umeclidinium-vilanterol (ANORO ELLIPTA) 62.5-25 MCG/ACT AEPB, Inhale 1 puff into the lungs daily., Disp: 1 each, Rfl: 11   vitamin B-12 (CYANOCOBALAMIN) 100 MCG tablet, Take 100 mcg by mouth daily., Disp: , Rfl:    VITAMIN D, CHOLECALCIFEROL, PO, Take 1 tablet by mouth daily. One daily, Disp: , Rfl:   Past Medical History: Past Medical History:  Diagnosis Date   Anemia    Anxiety    Occasional   Arthritis    COPD (chronic obstructive pulmonary disease) (HCC)    HTN (hypertension)    Iron deficiency anemia due to chronic blood loss 03/28/2022    Tobacco Use: Social History   Tobacco Use  Smoking Status Former   Packs/day: 0.50   Years: 55.00   Additional pack years: 0.00   Total pack years: 27.50   Types: Cigarettes   Quit date: 05/29/2021   Years since quitting: 1.2   Passive exposure: Past  Smokeless Tobacco Former  Tobacco Comments   Quit date 05/29/2021    Labs: Review Flowsheet       Latest Ref Rng & Units 03/01/2022 04/10/2022  Labs for ITP Cardiac and Pulmonary Rehab  Cholestrol 0 - 200 mg/dL 829  -  LDL (calc) 0 - 99 mg/dL 65  -  HDL-C >56 mg/dL 77  -  Trlycerides <213 mg/dL 38  -  TCO2 22 - 32 mmol/L - 28  Capillary Blood Glucose: No results found for: "GLUCAP"   Pulmonary Assessment Scores:  Pulmonary Assessment Scores     Row Name 08/22/22 1314         ADL UCSD   SOB Score total 34     Rest 0     Walk 3     Stairs 4     Bath 0     Dress 0     Shop 2       CAT Score   CAT Score 15       mMRC Score   mMRC Score 1             UCSD: Self-administered rating of dyspnea associated with activities of daily living (ADLs) 6-point scale (0 = "not at all" to 5 = "maximal or unable to do because of breathlessness")  Scoring Scores range from 0 to 120.  Minimally important difference is 5 units  CAT: CAT can identify the health impairment of COPD patients and is better correlated with disease progression.  CAT has a scoring range of zero  to 40. The CAT score is classified into four groups of low (less than 10), medium (10 - 20), high (21-30) and very high (31-40) based on the impact level of disease on health status. A CAT score over 10 suggests significant symptoms.  A worsening CAT score could be explained by an exacerbation, poor medication adherence, poor inhaler technique, or progression of COPD or comorbid conditions.  CAT MCID is 2 points  mMRC: mMRC (Modified Medical Research Council) Dyspnea Scale is used to assess the degree of baseline functional disability in patients of respiratory disease due to dyspnea. No minimal important difference is established. A decrease in score of 1 point or greater is considered a positive change.   Pulmonary Function Assessment:   Exercise Target Goals: Exercise Program Goal: Individual exercise prescription set using results from initial 6 min walk test and THRR while considering  patient's activity barriers and safety.   Exercise Prescription Goal: Initial exercise prescription builds to 30-45 minutes a day of aerobic activity, 2-3 days per week.  Home exercise guidelines will be given to patient during program as part of exercise prescription that the participant will acknowledge.  Activity Barriers & Risk Stratification:  Activity Barriers & Cardiac Risk Stratification - 08/22/22 1322       Activity Barriers & Cardiac Risk Stratification   Activity Barriers Arthritis;Back Problems;Neck/Spine Problems;Joint Problems;Deconditioning;Muscular Weakness;Shortness of Breath    Cardiac Risk Stratification High             6 Minute Walk:  6 Minute Walk     Row Name 08/22/22 1432         6 Minute Walk   Phase Initial     Distance 800 feet     Walk Time 6 minutes     # of Rest Breaks 1     MPH 1.5     METS 2.38     RPE 13     Perceived Dyspnea  13     VO2 Peak 8.33     Symptoms Yes (comment)     Comments pt needed one sittting break due to SOB     Resting HR 81 bpm      Resting BP 134/60     Resting Oxygen Saturation  92 %     Exercise Oxygen Saturation  during 6 min walk 80 %     Max Ex. HR 109 bpm  Max Ex. BP 140/68     2 Minute Post BP 136/60       Interval HR   1 Minute HR 93     2 Minute HR 104     3 Minute HR 103     4 Minute HR 109     5 Minute HR 103     6 Minute HR 105     2 Minute Post HR 91     Interval Heart Rate? Yes       Interval Oxygen   Interval Oxygen? Yes     Baseline Oxygen Saturation % 92 %     1 Minute Oxygen Saturation % 90 %     1 Minute Liters of Oxygen 0 L     2 Minute Oxygen Saturation % 85 %     2 Minute Liters of Oxygen 0 L     3 Minute Oxygen Saturation % 88 %     3 Minute Liters of Oxygen 0 L     4 Minute Oxygen Saturation % 83 %     5 Minute Oxygen Saturation % 81 %     6 Minute Oxygen Saturation % 80 %     6 Minute Liters of Oxygen 0 L     2 Minute Post Oxygen Saturation % 92 %     2 Minute Post Liters of Oxygen 0 L              Oxygen Initial Assessment:  Oxygen Initial Assessment - 08/22/22 1437       Initial 6 min Walk   Oxygen Used None      Program Oxygen Prescription   Program Oxygen Prescription None   pt did not have accuret reading due to nail polish, will be using forhead sensor if nail polish isnt removed            Oxygen Re-Evaluation:   Oxygen Discharge (Final Oxygen Re-Evaluation):   Initial Exercise Prescription:  Initial Exercise Prescription - 08/22/22 1400       Date of Initial Exercise RX and Referring Provider   Date 08/22/22    Referring Provider Dr. Sherene Sires    Expected Discharge Date 01/03/23      Treadmill   MPH 1.3    Grade 0    Minutes 17      NuStep   Level 1    SPM 60    Minutes 22      Prescription Details   Frequency (times per week) 2    Duration Progress to 30 minutes of continuous aerobic without signs/symptoms of physical distress      Intensity   THRR 40-80% of Max Heartrate 60-119    Ratings of Perceived Exertion 11-13     Perceived Dyspnea 0-4      Resistance Training   Training Prescription Yes    Weight 3    Reps 10-15             Perform Capillary Blood Glucose checks as needed.  Exercise Prescription Changes:   Exercise Prescription Changes     Row Name 08/28/22 1500 09/11/22 1500           Response to Exercise   Blood Pressure (Admit) 112/58 120/60      Blood Pressure (Exercise) 126/60 110/58      Blood Pressure (Exit) 110/60 120/68      Heart Rate (Admit) 83 bpm 78 bpm      Heart Rate (Exercise) 86 bpm  95 bpm      Heart Rate (Exit) 85 bpm 77 bpm      Oxygen Saturation (Admit) 94 % 95 %      Oxygen Saturation (Exercise) 92 % 95 %      Oxygen Saturation (Exit) 95 % 96 %      Rating of Perceived Exertion (Exercise) 12 12      Perceived Dyspnea (Exercise) 12 12      Duration Continue with 30 min of aerobic exercise without signs/symptoms of physical distress. Continue with 30 min of aerobic exercise without signs/symptoms of physical distress.      Intensity THRR unchanged THRR unchanged        Progression   Progression Continue to progress workloads to maintain intensity without signs/symptoms of physical distress. Continue to progress workloads to maintain intensity without signs/symptoms of physical distress.        Resistance Training   Training Prescription Yes Yes      Weight 2 2      Reps 10-15 10-15      Time 10 Minutes 10 Minutes        Treadmill   MPH 1.3 1.5      Grade 0 0      Minutes 17 17      METs 2 2.15        NuStep   Level 1 2      SPM 40 79      Minutes 22 22      METs 1.7 1.9               Exercise Comments:   Exercise Goals and Review:   Exercise Goals     Row Name 08/22/22 1435 09/10/22 1412           Exercise Goals   Increase Physical Activity Yes Yes      Intervention Provide advice, education, support and counseling about physical activity/exercise needs.;Develop an individualized exercise prescription for aerobic and resistive  training based on initial evaluation findings, risk stratification, comorbidities and participant's personal goals. Provide advice, education, support and counseling about physical activity/exercise needs.;Develop an individualized exercise prescription for aerobic and resistive training based on initial evaluation findings, risk stratification, comorbidities and participant's personal goals.      Expected Outcomes Short Term: Attend rehab on a regular basis to increase amount of physical activity.;Long Term: Exercising regularly at least 3-5 days a week.;Long Term: Add in home exercise to make exercise part of routine and to increase amount of physical activity. Short Term: Attend rehab on a regular basis to increase amount of physical activity.;Long Term: Exercising regularly at least 3-5 days a week.;Long Term: Add in home exercise to make exercise part of routine and to increase amount of physical activity.      Increase Strength and Stamina Yes Yes      Intervention Provide advice, education, support and counseling about physical activity/exercise needs.;Develop an individualized exercise prescription for aerobic and resistive training based on initial evaluation findings, risk stratification, comorbidities and participant's personal goals. Provide advice, education, support and counseling about physical activity/exercise needs.;Develop an individualized exercise prescription for aerobic and resistive training based on initial evaluation findings, risk stratification, comorbidities and participant's personal goals.      Expected Outcomes Short Term: Increase workloads from initial exercise prescription for resistance, speed, and METs.;Short Term: Perform resistance training exercises routinely during rehab and add in resistance training at home;Long Term: Improve cardiorespiratory fitness, muscular endurance and strength as measured by  increased METs and functional capacity ( ) Short Term: Increase  workloads from initial exercise prescription for resistance, speed, and METs.;Short Term: Perform resistance training exercises routinely during rehab and add in resistance training at home;Long Term: Improve cardiorespiratory fitness, muscular endurance and strength as measured by increased METs and functional capacity ( )      Able to understand and use rate of perceived exertion (RPE) scale Yes Yes      Intervention Provide education and explanation on how to use RPE scale Provide education and explanation on how to use RPE scale      Expected Outcomes Short Term: Able to use RPE daily in rehab to express subjective intensity level;Long Term:  Able to use RPE to guide intensity level when exercising independently Short Term: Able to use RPE daily in rehab to express subjective intensity level;Long Term:  Able to use RPE to guide intensity level when exercising independently      Able to understand and use Dyspnea scale Yes Yes      Intervention Provide education and explanation on how to use Dyspnea scale Provide education and explanation on how to use Dyspnea scale      Expected Outcomes Short Term: Able to use Dyspnea scale daily in rehab to express subjective sense of shortness of breath during exertion;Long Term: Able to use Dyspnea scale to guide intensity level when exercising independently Short Term: Able to use Dyspnea scale daily in rehab to express subjective sense of shortness of breath during exertion;Long Term: Able to use Dyspnea scale to guide intensity level when exercising independently      Knowledge and understanding of Target Heart Rate Range (THRR) Yes Yes      Intervention Provide education and explanation of THRR including how the numbers were predicted and where they are located for reference Provide education and explanation of THRR including how the numbers were predicted and where they are located for reference      Expected Outcomes Short Term: Able to state/look up  THRR;Long Term: Able to use THRR to govern intensity when exercising independently;Short Term: Able to use daily as guideline for intensity in rehab Short Term: Able to state/look up THRR;Long Term: Able to use THRR to govern intensity when exercising independently;Short Term: Able to use daily as guideline for intensity in rehab      Understanding of Exercise Prescription Yes Yes      Intervention Provide education, explanation, and written materials on patient's individual exercise prescription Provide education, explanation, and written materials on patient's individual exercise prescription      Expected Outcomes Short Term: Able to explain program exercise prescription;Long Term: Able to explain home exercise prescription to exercise independently Short Term: Able to explain program exercise prescription;Long Term: Able to explain home exercise prescription to exercise independently               Exercise Goals Re-Evaluation :  Exercise Goals Re-Evaluation     Row Name 09/10/22 1413             Exercise Goal Re-Evaluation   Exercise Goals Review Increase Physical Activity;Increase Strength and Stamina;Able to understand and use rate of perceived exertion (RPE) scale;Able to understand and use Dyspnea scale;Knowledge and understanding of Target Heart Rate Range (THRR);Understanding of Exercise Prescription       Comments Pt has completed 4 sessions of pulmonary rehab. She is very motivated to be in the program and to improve her health. She is tolerating exercise and increasing her workload. She sometimes  has to use the head O2 band due to her nail polish and the meter not reading properly. She is currently exercising at 2.0 METs on the treadmill. Will continue to monitor and progress as able.       Expected Outcomes Through exercise at rehab and home, patient will achieve their goals.                Discharge Exercise Prescription (Final Exercise Prescription Changes):  Exercise  Prescription Changes - 09/11/22 1500       Response to Exercise   Blood Pressure (Admit) 120/60    Blood Pressure (Exercise) 110/58    Blood Pressure (Exit) 120/68    Heart Rate (Admit) 78 bpm    Heart Rate (Exercise) 95 bpm    Heart Rate (Exit) 77 bpm    Oxygen Saturation (Admit) 95 %    Oxygen Saturation (Exercise) 95 %    Oxygen Saturation (Exit) 96 %    Rating of Perceived Exertion (Exercise) 12    Perceived Dyspnea (Exercise) 12    Duration Continue with 30 min of aerobic exercise without signs/symptoms of physical distress.    Intensity THRR unchanged      Progression   Progression Continue to progress workloads to maintain intensity without signs/symptoms of physical distress.      Resistance Training   Training Prescription Yes    Weight 2    Reps 10-15    Time 10 Minutes      Treadmill   MPH 1.5    Grade 0    Minutes 17    METs 2.15      NuStep   Level 2    SPM 79    Minutes 22    METs 1.9             Nutrition:  Target Goals: Understanding of nutrition guidelines, daily intake of sodium 1500mg , cholesterol 200mg , calories 30% from fat and 7% or less from saturated fats, daily to have 5 or more servings of fruits and vegetables.  Biometrics:  Pre Biometrics - 08/22/22 1436       Pre Biometrics   Height 5\' 4"  (1.626 m)    Weight 57.6 kg    Waist Circumference 33 inches    Hip Circumference 37 inches    Waist to Hip Ratio 0.89 %    BMI (Calculated) 21.79    Triceps Skinfold 29 mm    % Body Fat 35.7 %    Grip Strength 21.2 kg    Flexibility 0 in    Single Leg Stand 10.43 seconds              Nutrition Therapy Plan and Nutrition Goals:  Nutrition Therapy & Goals - 08/22/22 1330       Personal Nutrition Goals   Comments Pt eats a regular diet but has recently started to cut back on sweets.      Intervention Plan   Intervention Prescribe, educate and counsel regarding individualized specific dietary modifications aiming towards  targeted core components such as weight, hypertension, lipid management, diabetes, heart failure and other comorbidities.;Nutrition handout(s) given to patient.    Expected Outcomes Short Term Goal: Understand basic principles of dietary content, such as calories, fat, sodium, cholesterol and nutrients.;Long Term Goal: Adherence to prescribed nutrition plan.             Nutrition Assessments:  Nutrition Assessments - 08/22/22 1336       MEDFICTS Scores   Pre Score 39  MEDIFICTS Score Key: ?70 Need to make dietary changes  40-70 Heart Healthy Diet ? 40 Therapeutic Level Cholesterol Diet   Picture Your Plate Scores: <16 Unhealthy dietary pattern with much room for improvement. 41-50 Dietary pattern unlikely to meet recommendations for good health and room for improvement. 51-60 More healthful dietary pattern, with some room for improvement.  >60 Healthy dietary pattern, although there may be some specific behaviors that could be improved.    Nutrition Goals Re-Evaluation:   Nutrition Goals Discharge (Final Nutrition Goals Re-Evaluation):   Psychosocial: Target Goals: Acknowledge presence or absence of significant depression and/or stress, maximize coping skills, provide positive support system. Participant is able to verbalize types and ability to use techniques and skills needed for reducing stress and depression.  Initial Review & Psychosocial Screening:  Initial Psych Review & Screening - 08/22/22 1328       Initial Review   Current issues with Current Sleep Concerns;Current Anxiety/Panic      Family Dynamics   Good Support System? Yes    Comments Pt has a good support sytem with her husband, her daughters, and her grandchildren.      Barriers   Psychosocial barriers to participate in program There are no identifiable barriers or psychosocial needs.      Screening Interventions   Interventions Encouraged to exercise;Provide feedback about the  scores to participant;To provide support and resources with identified psychosocial needs    Expected Outcomes Short Term goal: Utilizing psychosocial counselor, staff and physician to assist with identification of specific Stressors or current issues interfering with healing process. Setting desired goal for each stressor or current issue identified.;Long Term Goal: Stressors or current issues are controlled or eliminated.;Short Term goal: Identification and review with participant of any Quality of Life or Depression concerns found by scoring the questionnaire.;Long Term goal: The participant improves quality of Life and PHQ9 Scores as seen by post scores and/or verbalization of changes             Quality of Life Scores:  Quality of Life - 08/22/22 1450       Quality of Life   Select Quality of Life      Quality of Life Scores   Health/Function Pre 22.53 %    Socioeconomic Pre 29.17 %    Psych/Spiritual Pre 29.64 %    Family Pre 28.8 %    GLOBAL Pre 26.09 %            Scores of 19 and below usually indicate a poorer quality of life in these areas.  A difference of  2-3 points is a clinically meaningful difference.  A difference of 2-3 points in the total score of the Quality of Life Index has been associated with significant improvement in overall quality of life, self-image, physical symptoms, and general health in studies assessing change in quality of life.   PHQ-9: Review Flowsheet       08/22/2022  Depression screen PHQ 2/9  Decreased Interest 0  Down, Depressed, Hopeless 0  PHQ - 2 Score 0  Altered sleeping 1  Tired, decreased energy 0  Change in appetite 0  Feeling bad or failure about yourself  0  Trouble concentrating 0  Moving slowly or fidgety/restless 0  Suicidal thoughts 0  PHQ-9 Score 1  Difficult doing work/chores Not difficult at all   Interpretation of Total Score  Total Score Depression Severity:  1-4 = Minimal depression, 5-9 = Mild depression,  10-14 = Moderate depression, 15-19 = Moderately  severe depression, 20-27 = Severe depression   Psychosocial Evaluation and Intervention:  Psychosocial Evaluation - 08/22/22 1419       Psychosocial Evaluation & Interventions   Interventions Encouraged to exercise with the program and follow exercise prescription;Stress management education;Relaxation education    Comments Pt has no identifiable psychosocial barriers to completing the PR program.  She is excited to start the program and improve her overall health.  She has a good support system in her husband, daughters, and grandchildren.  She scored a 1 on her PHQ-9 related to the fact that she has trouble falling asleep at times, but the pt does take a Xanax at night as needed to help her sleep and reduce anxiety.  Right now the pt feels like the Xanax is controlling her anxiety and insomnia.  She quit smoking in March of 2023 after smoking for 55 years.  She does have some lower back pain related to arthritis, and she is still having "heaviness" in her legs.  She is able to manage her back pain with Tylenol and stretching, and she hopes that once she loses some weight the leg "heaviness" will subside.  Her goals for the program are to breathe better with less shortness of breath and be able to walk/shop more.  We will monitor her progress as she works toward meeting these goals.    Expected Outcomes Pt's anxiety/insomnia will continue to be managed with Xanax, and she will continue to have no idenfiable psychosocial issues.    Continue Psychosocial Services  No Follow up required             Psychosocial Re-Evaluation:  Psychosocial Re-Evaluation     Row Name 09/04/22 1155             Psychosocial Re-Evaluation   Current issues with Current Sleep Concerns       Comments Patient is new to the program completing 2 sessions.  She is currently is being treated via PCP for insomnia with xanax prn.  Patient was referred to PR by Dr. Sherene Sires  with chronic resp failure with Hypoxia COPD mixed type.  Her initial PHQ-9 score is 1 and her QOL score is 26.09%.  We will continue to monitor  her progress as she works toward meeting these goals.       Expected Outcomes Patient will have no psychosocial barriers identified at discharge.       Interventions Relaxation education;Encouraged to attend Pulmonary Rehabilitation for the exercise;Stress management education       Continue Psychosocial Services  No Follow up required                Psychosocial Discharge (Final Psychosocial Re-Evaluation):  Psychosocial Re-Evaluation - 09/04/22 1155       Psychosocial Re-Evaluation   Current issues with Current Sleep Concerns    Comments Patient is new to the program completing 2 sessions.  She is currently is being treated via PCP for insomnia with xanax prn.  Patient was referred to PR by Dr. 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  Her initial PHQ-9 score is 1 and her QOL score is 26.09%.  We will continue to monitor  her progress as she works toward meeting these goals.    Expected Outcomes Patient will have no psychosocial barriers identified at discharge.    Interventions Relaxation education;Encouraged to attend Pulmonary Rehabilitation for the exercise;Stress management education    Continue Psychosocial Services  No Follow up required  Education: Education Goals: Education classes will be provided on a weekly basis, covering required topics. Participant will state understanding/return demonstration of topics presented.  Learning Barriers/Preferences:  Learning Barriers/Preferences - 08/22/22 1336       Learning Barriers/Preferences   Learning Barriers None    Learning Preferences Written Material             Education Topics: How Lungs Work and Diseases: - Discuss the anatomy of the lungs and diseases that can affect the lungs, such as COPD.   Exercise: -Discuss the importance of  exercise, FITT principles of exercise, normal and abnormal responses to exercise, and how to exercise safely.   Environmental Irritants: -Discuss types of environmental irritants and how to limit exposure to environmental irritants.   Meds/Inhalers and oxygen: - Discuss respiratory medications, definition of an inhaler and oxygen, and the proper way to use an inhaler and oxygen.   Energy Saving Techniques: - Discuss methods to conserve energy and decrease shortness of breath when performing activities of daily living.    Bronchial Hygiene / Breathing Techniques: - Discuss breathing mechanics, pursed-lip breathing technique,  proper posture, effective ways to clear airways, and other functional breathing techniques   Cleaning Equipment: - Provides group verbal and written instruction about the health risks of elevated stress, cause of high stress, and healthy ways to reduce stress.   Nutrition I: Fats: - Discuss the types of cholesterol, what cholesterol does to the body, and how cholesterol levels can be controlled.   Nutrition II: Labels: -Discuss the different components of food labels and how to read food labels.   Respiratory Infections: - Discuss the signs and symptoms of respiratory infections, ways to prevent respiratory infections, and the importance of seeking medical treatment when having a respiratory infection. Flowsheet Row PULMONARY REHAB OTHER RESPIRATORY from 09/06/2022 in [facility]  Date 08/30/22  Educator Handout       Stress I: Signs and Symptoms: - Discuss the causes of stress, how stress may lead to anxiety and depression, and ways to limit stress. Flowsheet Row PULMONARY REHAB OTHER RESPIRATORY from 09/06/2022 in [facility]  Date 09/06/22  Educator handout       Stress II: Relaxation: -Discuss relaxation techniques to limit stress.   Oxygen for Home/Travel: - Discuss how to prepare for travel when  on oxygen and proper ways to transport and store oxygen to ensure safety.   Knowledge Questionnaire Score:  Knowledge Questionnaire Score - 08/22/22 1337       Knowledge Questionnaire Score   Pre Score 12/18             Core Components/Risk Factors/Patient Goals at Admission:  Personal Goals and Risk Factors at Admission - 08/22/22 1342       Core Components/Risk Factors/Patient Goals on Admission    Weight Management Weight Loss    Improve shortness of breath with ADL's Yes    Intervention Provide education, individualized exercise plan and daily activity instruction to help decrease symptoms of SOB with activities of daily living.    Expected Outcomes Short Term: Improve cardiorespiratory fitness to achieve a reduction of symptoms when performing ADLs;Long Term: Be able to perform more ADLs without symptoms or delay the onset of symptoms    Increase knowledge of respiratory medications and ability to use respiratory devices properly  Yes    Intervention Provide education and demonstration as needed of appropriate use of medications, inhalers, and oxygen therapy.    Expected Outcomes Short Term: Achieves  understanding of medications use. Understands that oxygen is a medication prescribed by physician. Demonstrates appropriate use of inhaler and oxygen therapy.;Long Term: Maintain appropriate use of medications, inhalers, and oxygen therapy.    Hypertension Yes    Intervention Provide education on lifestyle modifcations including regular physical activity/exercise, weight management, moderate sodium restriction and increased consumption of fresh fruit, vegetables, and low fat dairy, alcohol moderation, and smoking cessation.    Expected Outcomes Short Term: Continued assessment and intervention until BP is < 140/35mm HG in hypertensive participants. < 130/30mm HG in hypertensive participants with diabetes, heart failure or chronic kidney disease.;Long Term: Maintenance of blood pressure  at goal levels.    Personal Goal Other Yes    Personal Goal Pt wants to breathe better with less shortness of breath and be able to walk more and shop.    Intervention Pt will attend PR twice a week and start a home exercise program.    Expected Outcomes Pt will attend PR twice a week and complete the program meeting both her personal and program goals.             Core Components/Risk Factors/Patient Goals Review:   Goals and Risk Factor Review     Row Name 09/04/22 1206             Core Components/Risk Factors/Patient Goals Review   Personal Goals Review Improve shortness of breath with ADL's;Develop more efficient breathing techniques such as purse lipped breathing and diaphragmatic breathing and practicing self-pacing with activity.       Review Patient is new to the PR program attending 2 sessions.  She seems to enjoy class and interactive with class and staff.  Patient was referred to PR by Dr. 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  Her personal goals for the program are to have less SOB , breath better and be abe to walk more while shopping.  She tolerates exercise well while on Treadmill and nu-step with O2 sats maintaining at 92% without any oxygen supplemental needed at this time.  We will continue to monitor as she works toward meeting these goals as she progresses in the program.       Expected Outcomes Patient will complete the program meeting both program and personal goals.                Core Components/Risk Factors/Patient Goals at Discharge (Final Review):   Goals and Risk Factor Review - 09/04/22 1206       Core Components/Risk Factors/Patient Goals Review   Personal Goals Review Improve shortness of breath with ADL's;Develop more efficient breathing techniques such as purse lipped breathing and diaphragmatic breathing and practicing self-pacing with activity.    Review Patient is new to the PR program attending 2 sessions.  She  seems to enjoy class and interactive with class and staff.  Patient was referred to PR by Dr. 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  Her personal goals for the program are to have less SOB , breath better and be abe to walk more while shopping.  She tolerates exercise well while on Treadmill and nu-step with O2 sats maintaining at 92% without any oxygen supplemental needed at this time.  We will continue to monitor as she works toward meeting these goals as she progresses in the program.    Expected Outcomes Patient will complete the program meeting both program and personal goals.  ITP Comments:   Comments: ITP REVIEW Pt is making expected progress toward pulmonary rehab goals after completing 6 sessions. Recommend continued exercise, life style modification, education, and utilization of breathing techniques to increase stamina and strength and decrease shortness of breath with exertion.

## 2022-09-13 ENCOUNTER — Encounter (HOSPITAL_COMMUNITY)
Admission: RE | Admit: 2022-09-13 | Discharge: 2022-09-13 | Disposition: A | Discharge status: Home / Self Care | Payer: Medicare Other | Source: Ambulatory Visit | Attending: Internal Medicine | Admitting: Internal Medicine

## 2022-09-13 DIAGNOSIS — J9611 Chronic respiratory failure with hypoxia: Secondary | ICD-10-CM

## 2022-09-13 DIAGNOSIS — J449 Chronic obstructive pulmonary disease, unspecified: Secondary | ICD-10-CM

## 2022-09-13 NOTE — Progress Notes (Signed)
Daily Session Note  Patient Details  Name: Desiree Barber MRN: [id number] Date of Birth: Jan 20, 1951 Referring Provider:   Flowsheet Row PULMONARY REHAB OTHER RESP ORIENTATION from 08/22/2022 in [facility]  Referring Provider Dr. Sherene Sires       Encounter Date: 09/13/2022  Check In:  Session Check In - 09/13/22 1456       Check-In   Supervising physician immediately available to respond to emergencies [facility] MD immediately available    Physician(s) Dr Diona Browner    Location [facility]    Staff Present Ross Ludwig, BS, Exercise Physiologist;Ryla Cauthon Daphine Deutscher, RN, BSN    Virtual Visit No    Fall or balance concerns reported    No    Tobacco Cessation No Change    Warm-up and Cool-down Performed as group-led instruction    Resistance Training Performed Yes      Pain Assessment   Currently in Pain? No/denies    Pain Score 0-No pain             Capillary Blood Glucose: No results found for this or any previous visit (from the past 24 hour(s)).    Social History   Tobacco Use  Smoking Status Former   Packs/day: 0.50   Years: 55.00   Additional pack years: 0.00   Total pack years: 27.50   Types: Cigarettes   Quit date: 05/29/2021   Years since quitting: 1.2   Passive exposure: Past  Smokeless Tobacco Former  Tobacco Comments   Quit date 05/29/2021    Goals Met:  Proper associated with RPD/PD & O2 Sat Independence with exercise equipment Using PLB without cueing & demonstrates good technique Exercise tolerated well Queuing for purse lip breathing No report of concerns or symptoms today Strength training completed today  Goals Unmet:  Not Applicable  Comments: Checkout at 1600.   Dr. Erick Blinks is Medical Director for [facility] Pulmonary Rehab.

## 2022-09-18 ENCOUNTER — Encounter (HOSPITAL_COMMUNITY)
Admission: RE | Admit: 2022-09-18 | Discharge: 2022-09-18 | Disposition: A | Discharge status: Home / Self Care | Payer: Medicare Other | Source: Ambulatory Visit | Attending: Internal Medicine | Admitting: Internal Medicine

## 2022-09-18 DIAGNOSIS — J449 Chronic obstructive pulmonary disease, unspecified: Secondary | ICD-10-CM | POA: Diagnosis not present

## 2022-09-18 DIAGNOSIS — J9611 Chronic respiratory failure with hypoxia: Secondary | ICD-10-CM

## 2022-09-18 NOTE — Progress Notes (Signed)
Daily Session Note  Patient Details  Name: POETRY CERRO MRN: [id number] Date of Birth: [birth year] Referring Provider:   Flowsheet Row PULMONARY REHAB OTHER RESP ORIENTATION from 08/22/2022 in [facility]  Referring Provider Dr. Sherene Sires       Encounter Date: 09/18/2022  Check In:  Session Check In - 09/18/22 1500       Check-In   Supervising physician immediately available to respond to emergencies [facility] MD immediately available    Physician(s) Dr. Jenene Slicker    Location [facility]    Staff Present Ross Ludwig, BS, Exercise Physiologist;Anorah Trias, RN;Daphyne Daphine Deutscher, RN, BSN    Virtual Visit No    Medication changes reported     No    Fall or balance concerns reported    No    Tobacco Cessation No Change    Warm-up and Cool-down Performed as group-led instruction    Resistance Training Performed Yes    VAD Patient? No    PAD/SET Patient? No      Pain Assessment   Currently in Pain? No/denies    Pain Score 0-No pain    Multiple Pain Sites No             Capillary Blood Glucose: No results found for this or any previous visit (from the past 24 hour(s)).    Social History   Tobacco Use  Smoking Status Former   Packs/day: 0.50   Years: 55.00   Additional pack years: 0.00   Total pack years: 27.50   Types: Cigarettes   Quit date: 05/29/2021   Years since quitting: 1.3   Passive exposure: Past  Smokeless Tobacco Former  Tobacco Comments   Quit date 05/29/2021    Goals Met:  Proper associated with RPD/PD & O2 Sat Independence with exercise equipment Using PLB without cueing & demonstrates good technique Exercise tolerated well No report of concerns or symptoms today Strength training completed today  Goals Unmet:  Not Applicable  Comments: check out @ 4:00pm   Dr. Dina Rich is Medical Director for [facility] Cardiac Rehab

## 2022-09-20 ENCOUNTER — Encounter (HOSPITAL_COMMUNITY)
Admission: RE | Admit: 2022-09-20 | Discharge: 2022-09-20 | Disposition: A | Discharge status: Home / Self Care | Payer: Medicare Other | Source: Ambulatory Visit | Attending: Internal Medicine | Admitting: Internal Medicine

## 2022-09-20 DIAGNOSIS — J9611 Chronic respiratory failure with hypoxia: Secondary | ICD-10-CM

## 2022-09-20 DIAGNOSIS — J449 Chronic obstructive pulmonary disease, unspecified: Secondary | ICD-10-CM | POA: Diagnosis not present

## 2022-09-20 NOTE — Progress Notes (Signed)
Daily Session Note  Patient Details  Name: KLARISSA MCILVAIN MRN: [id number] Date of Birth: 01-Oct-1950 Referring Provider:   Flowsheet Row PULMONARY REHAB OTHER RESP ORIENTATION from 08/22/2022 in [facility] C[facility] RE[facility]  Referring Provider Dr. Sherene Sires       Encounter Date: 09/20/2022  Check In:  Session Check In - 09/20/22 1453       Check-In   Supervising physician immediately available to respond to emergencies [facility] MD immediately available    Physician(s) Dr. Jenene Slicker    Location [facility]    Staff Present Ross Ludwig, BS, Exercise Physiologist;Sion Thane Daphine Deutscher, RN, BSN    Virtual Visit No    Medication changes reported     No    Fall or balance concerns reported    No    Tobacco Cessation No Change    Warm-up and Cool-down Performed as group-led instruction    Resistance Training Performed Yes      Pain Assessment   Currently in Pain? No/denies    Pain Score 0-No pain             Capillary Blood Glucose: No results found for this or any previous visit (from the past 24 hour(s)).    Social History   Tobacco Use  Smoking Status Former   Packs/day: 0.50   Years: 55.00   Additional pack years: 0.00   Total pack years: 27.50   Types: Cigarettes   Quit date: 05/29/2021   Years since quitting: 1.3   Passive exposure: Past  Smokeless Tobacco Former  Tobacco Comments   Quit date 05/29/2021    Goals Met:  Proper associated with RPD/PD & O2 Sat Independence with exercise equipment Using PLB without cueing & demonstrates good technique Exercise tolerated well Queuing for purse lip breathing No report of concerns or symptoms today Strength training completed today  Goals Unmet:  Not Applicable  Comments: Checkout at 1600.   Dr. Erick Blinks is Medical Director for [facility] Pulmonary Rehab.

## 2022-09-25 ENCOUNTER — Encounter (HOSPITAL_COMMUNITY)
Admission: RE | Admit: 2022-09-25 | Discharge: 2022-09-25 | Disposition: A | Discharge status: Home / Self Care | Payer: Medicare Other | Source: Ambulatory Visit | Attending: Internal Medicine | Admitting: Internal Medicine

## 2022-09-25 DIAGNOSIS — J9611 Chronic respiratory failure with hypoxia: Secondary | ICD-10-CM | POA: Diagnosis not present

## 2022-09-25 DIAGNOSIS — J449 Chronic obstructive pulmonary disease, unspecified: Secondary | ICD-10-CM | POA: Diagnosis not present

## 2022-09-25 NOTE — Progress Notes (Signed)
Daily Session Note  Patient Details  Name: Desiree Barber MRN: [id number] Date of Birth: Oct 10, 1950 Referring Provider:   Flowsheet Row PULMONARY REHAB OTHER RESP ORIENTATION from 08/22/2022 in [facility] C[facility]IAC REH[facility]  Referring Provider Dr. Sherene Sires       Encounter Date: 09/25/2022  Check In:  Session Check In - 09/25/22 1457       Check-In   Supervising physician immediately available to respond to emergencies [facility] MD immediately available    Physician(s) Dr. Wyline Mood    Location [facility]    Staff Present Ross Ludwig, BS, Exercise Physiologist;Daeton Kluth, Margarite Gouge, RN, BSN;Other    Virtual Visit No    Medication changes reported     No    Fall or balance concerns reported    No    Tobacco Cessation No Change    Warm-up and Cool-down Performed as group-led instruction    Resistance Training Performed Yes    VAD Patient? No    PAD/SET Patient? No      Pain Assessment   Currently in Pain? No/denies    Pain Score 0-No pain    Multiple Pain Sites No             Capillary Blood Glucose: No results found for this or any previous visit (from the past 24 hour(s)).    Social History   Tobacco Use  Smoking Status Former   Packs/day: 0.50   Years: 55.00   Additional pack years: 0.00   Total pack years: 27.50   Types: Cigarettes   Quit date: 05/29/2021   Years since quitting: 1.3   Passive exposure: Past  Smokeless Tobacco Former  Tobacco Comments   Quit date 05/29/2021    Goals Met:  Independence with exercise equipment Using PLB without cueing & demonstrates good technique Exercise tolerated well No report of concerns or symptoms today Strength training completed today  Goals Unmet:  Not Applicable  Comments: checkout @ 4:00pm   Dr. Erick Blinks is Medical Director for [facility] Pulmonary Rehab.

## 2022-09-27 ENCOUNTER — Encounter (HOSPITAL_COMMUNITY): Payer: Medicare Other

## 2022-10-02 ENCOUNTER — Encounter (HOSPITAL_COMMUNITY)
Admission: RE | Admit: 2022-10-02 | Discharge: 2022-10-02 | Disposition: A | Discharge status: Home / Self Care | Payer: Medicare Other | Source: Ambulatory Visit | Attending: Internal Medicine | Admitting: Internal Medicine

## 2022-10-02 DIAGNOSIS — J449 Chronic obstructive pulmonary disease, unspecified: Secondary | ICD-10-CM | POA: Diagnosis not present

## 2022-10-02 DIAGNOSIS — J9611 Chronic respiratory failure with hypoxia: Secondary | ICD-10-CM | POA: Diagnosis not present

## 2022-10-02 NOTE — Progress Notes (Signed)
Daily Session Note  Patient Details  Name: Desiree Barber MRN: [id number] Date of Birth: 09-30-1950 Referring Provider:   Flowsheet Row PULMONARY REHAB OTHER RESP ORIENTATION from 08/22/2022 in [facility] C[facility]IAC RE[facility]  Referring Provider Dr. Sherene Sires       Encounter Date: 10/02/2022  Check In:  Session Check In - 10/02/22 1500       Check-In   Supervising physician immediately available to respond to emergencies [facility] MD immediately available    Physician(s) DrEden Emms    Location [facility]    Staff Present Ross Ludwig, BS, Exercise Physiologist;Symon Norwood, RN;Jessica Juanetta Gosling, MA, RCEP, CCRP, CCET    Virtual Visit No    Fall or balance concerns reported    No    Tobacco Cessation No Change    Warm-up and Cool-down Performed as group-led instruction    Resistance Training Performed Yes    VAD Patient? No    PAD/SET Patient? No      Pain Assessment   Currently in Pain? No/denies    Pain Score 0-No pain    Multiple Pain Sites No             Capillary Blood Glucose: No results found for this or any previous visit (from the past 24 hour(s)).    Social History   Tobacco Use  Smoking Status Former   Packs/day: 0.50   Years: 55.00   Additional pack years: 0.00   Total pack years: 27.50   Types: Cigarettes   Quit date: 05/29/2021   Years since quitting: 1.3   Passive exposure: Past  Smokeless Tobacco Former  Tobacco Comments   Quit date 05/29/2021    Goals Met:  Proper associated with RPD/PD & O2 Sat Independence with exercise equipment Using PLB without cueing & demonstrates good technique Exercise tolerated well No report of concerns or symptoms today Strength training completed today  Goals Unmet:  Not Applicable  Comments: Pt able to follow exercise prescription today without complaint.  Will continue to monitor for progression.    Dr. Erick Blinks is Medical Director for [facility] Pulmonary Rehab.

## 2022-10-04 ENCOUNTER — Encounter (HOSPITAL_COMMUNITY): Payer: Medicare Other

## 2022-10-09 ENCOUNTER — Encounter (HOSPITAL_COMMUNITY): Payer: Medicare Other

## 2022-10-10 ENCOUNTER — Encounter (HOSPITAL_COMMUNITY): Payer: Self-pay | Admitting: *Deleted

## 2022-10-10 DIAGNOSIS — J9611 Chronic respiratory failure with hypoxia: Secondary | ICD-10-CM

## 2022-10-10 DIAGNOSIS — J449 Chronic obstructive pulmonary disease, unspecified: Secondary | ICD-10-CM

## 2022-10-10 NOTE — Progress Notes (Signed)
Pulmonary Individual Treatment Plan  Patient Details  Name: Desiree Barber MRN: [id number] Date of Birth: February 22, 1951 Referring Provider:   Flowsheet Row PULMONARY REHAB OTHER RESP ORIENTATION from 08/22/2022 in [facility] CARDIAC REHABILITATION  Referring Provider Dr. Sherene Sires       Initial Encounter Date:  Flowsheet Row PULMONARY REHAB OTHER RESP ORIENTATION from 08/22/2022 in [facility] CARDIAC REH[facility]IL[facility]ATION  Date 08/22/22       Visit Diagnosis: Chronic respiratory failure with hypoxia (HCC)  COPD mixed type (HCC)  Patient's Home Medications on Admission:   Current Outpatient Medications:    albuterol (PROVENTIL) (2.5 MG/3ML) 0.083% nebulizer solution, Up to every 4 hours as needed, Disp: 75 mL, Rfl: 12   albuterol (VENTOLIN HFA) 108 (90 Base) MCG/ACT inhaler, Inhale into the lungs. (Patient not taking: Reported on 08/22/2022), Disp: , Rfl:    ALPRAZolam (XANAX) 0.25 MG tablet, Take 0.25 mg by mouth daily as needed for anxiety or sleep., Disp: , Rfl:    Ascorbic Acid (VITAMIN C) 100 MG tablet, Take 100 mg by mouth daily., Disp: , Rfl:    clopidogrel (PLAVIX) 75 MG tablet, Take 1 tablet (75 mg total) by mouth daily., Disp: 30 tablet, Rfl: 11   losartan (COZAAR) 25 MG tablet, Take 1 tablet (25 mg total) by mouth daily., Disp: 90 tablet, Rfl: 2   pantoprazole (PROTONIX) 40 MG tablet, Take 1 tablet (40 mg total) by mouth 2 (two) times daily., Disp: 60 tablet, Rfl: 11   TRELEGY ELLIPTA 100-62.5-25 MCG/ACT AEPB, Inhale 1 puff into the lungs daily., Disp: , Rfl:    umeclidinium-vilanterol (ANORO ELLIPTA) 62.5-25 MCG/ACT AEPB, Inhale 1 puff into the lungs daily., Disp: 1 each, Rfl: 11   vitamin B-12 (CYANOCOBALAMIN) 100 MCG tablet, Take 100 mcg by mouth daily., Disp: , Rfl:    VITAMIN D, CHOLECALCIFEROL, PO, Take 1 tablet by mouth daily. One daily, Disp: , Rfl:   Past Medical History: Past Medical History:  Diagnosis Date   Anemia    Anxiety    Occasional   Arthritis    COPD  (chronic obstructive pulmonary disease) (HCC)    HTN (hypertension)    Iron deficiency anemia due to chronic blood loss 03/28/2022    Tobacco Use: Social History   Tobacco Use  Smoking Status Former   Current packs/day: 0.00   Average packs/day: 0.5 packs/day for 55.0 years (27.5 ttl pk-yrs)   Types: Cigarettes   Start date: 05/30/1966   Quit date: 05/29/2021   Years since quitting: 1.3   Passive exposure: Past  Smokeless Tobacco Former  Tobacco Comments   Quit date 05/29/2021    Labs: Review Flowsheet       Latest Ref Rng & Units 03/01/2022 04/10/2022  Labs for ITP Cardiac and Pulmonary Rehab  Cholestrol 0 - 200 mg/dL 469  -  LDL (calc) 0 - 99 mg/dL 65  -  HDL-C >62 mg/dL 77  -  Trlycerides <952 mg/dL 38  -  TCO2 22 - 32 mmol/L - 28     Details            Capillary Blood Glucose: No results found for: "GLUCAP"   Pulmonary Assessment Scores:  Pulmonary Assessment Scores     Row Name 08/22/22 1314         ADL UCSD   SOB Score total 34     Rest 0     Walk 3     Stairs 4     Bath 0     Dress  0     Shop 2       CAT Score   CAT Score 15       mMRC Score   mMRC Score 1             UCSD: Self-administered rating of dyspnea associated with activities of daily living (ADLs) 6-point scale (0 = "not at all" to 5 = "maximal or unable to do because of breathlessness")  Scoring Scores range from 0 to 120.  Minimally important difference is 5 units  CAT: CAT can identify the health impairment of COPD patients and is better correlated with disease progression.  CAT has a scoring range of zero to 40. The CAT score is classified into four groups of low (less than 10), medium (10 - 20), high (21-30) and very high (31-40) based on the impact level of disease on health status. A CAT score over 10 suggests significant symptoms.  A worsening CAT score could be explained by an exacerbation, poor medication adherence, poor inhaler technique, or progression of COPD or  comorbid conditions.  CAT MCID is 2 points  mMRC: mMRC (Modified Medical Research Council) Dyspnea Scale is used to assess the degree of baseline functional disability in patients of respiratory disease due to dyspnea. No minimal important difference is established. A decrease in score of 1 point or greater is considered a positive change.   Pulmonary Function Assessment:   Exercise Target Goals: Exercise Program Goal: Individual exercise prescription set using results from initial 6 min walk test and THRR while considering  patient's activity barriers and safety.   Exercise Prescription Goal: Initial exercise prescription builds to 30-45 minutes a day of aerobic activity, 2-3 days per week.  Home exercise guidelines will be given to patient during program as part of exercise prescription that the participant will acknowledge.  Activity Barriers & Risk Stratification:  Activity Barriers & Cardiac Risk Stratification - 08/22/22 1322       Activity Barriers & Cardiac Risk Stratification   Activity Barriers Arthritis;Back Problems;Neck/Spine Problems;Joint Problems;Deconditioning;Muscular Weakness;Shortness of Breath    Cardiac Risk Stratification High             6 Minute Walk:  6 Minute Walk     Row Name 08/22/22 1432         6 Minute Walk   Phase Initial     Distance 800 feet     Walk Time 6 minutes     # of Rest Breaks 1     MPH 1.5     METS 2.38     RPE 13     Perceived Dyspnea  13     VO2 Peak 8.33     Symptoms Yes (comment)     Comments pt needed one sittting break due to SOB     Resting HR 81 bpm     Resting BP 134/60     Resting Oxygen Saturation  92 %     Exercise Oxygen Saturation  during 6 min walk 80 %     Max Ex. HR 109 bpm     Max Ex. BP 140/68     2 Minute Post BP 136/60       Interval HR   1 Minute HR 93     2 Minute HR 104     3 Minute HR 103     4 Minute HR 109     5 Minute HR 103     6 Minute HR 105  2 Minute Post HR 91      Interval Heart Rate? Yes       Interval Oxygen   Interval Oxygen? Yes     Baseline Oxygen Saturation % 92 %     1 Minute Oxygen Saturation % 90 %     1 Minute Liters of Oxygen 0 L     2 Minute Oxygen Saturation % 85 %     2 Minute Liters of Oxygen 0 L     3 Minute Oxygen Saturation % 88 %     3 Minute Liters of Oxygen 0 L     4 Minute Oxygen Saturation % 83 %     5 Minute Oxygen Saturation % 81 %     6 Minute Oxygen Saturation % 80 %     6 Minute Liters of Oxygen 0 L     2 Minute Post Oxygen Saturation % 92 %     2 Minute Post Liters of Oxygen 0 L              Oxygen Initial Assessment:  Oxygen Initial Assessment - 08/22/22 1437       Initial 6 min Walk   Oxygen Used None      Program Oxygen Prescription   Program Oxygen Prescription None   pt did not have accuret reading due to nail polish, will be using forhead sensor if nail polish isnt removed            Oxygen Re-Evaluation:  Oxygen Re-Evaluation     Row Name 10/09/22 1544             Program Oxygen Prescription   Program Oxygen Prescription None         Home Oxygen   Home Oxygen Device Portable Concentrator       Sleep Oxygen Prescription None       Home Exercise Oxygen Prescription Pulsed       Liters per minute 2       Home Resting Oxygen Prescription None         Goals/Expected Outcomes   Short Term Goals To learn and exhibit compliance with exercise, home and travel O2 prescription;To learn and understand importance of monitoring SPO2 with pulse oximeter and demonstrate accurate use of the pulse oximeter.;To learn and understand importance of maintaining oxygen saturations>88%;To learn and demonstrate proper pursed lip breathing techniques or other breathing techniques. ;To learn and demonstrate proper use of respiratory medications       Long  Term Goals Compliance with respiratory medication;Exhibits proper breathing techniques, such as pursed lip breathing or other method taught during program  session;Maintenance of O2 saturations>88%;Verbalizes importance of monitoring SPO2 with pulse oximeter and return demonstration;Exhibits compliance with exercise, home  and travel O2 prescription                Oxygen Discharge (Final Oxygen Re-Evaluation):  Oxygen Re-Evaluation - 10/09/22 1544       Program Oxygen Prescription   Program Oxygen Prescription None      Home Oxygen   Home Oxygen Device Portable Concentrator    Sleep Oxygen Prescription None    Home Exercise Oxygen Prescription Pulsed    Liters per minute 2    Home Resting Oxygen Prescription None      Goals/Expected Outcomes   Short Term Goals To learn and exhibit compliance with exercise, home and travel O2 prescription;To learn and understand importance of monitoring SPO2 with pulse oximeter and demonstrate accurate use of the  pulse oximeter.;To learn and understand importance of maintaining oxygen saturations>88%;To learn and demonstrate proper pursed lip breathing techniques or other breathing techniques. ;To learn and demonstrate proper use of respiratory medications    Long  Term Goals Compliance with respiratory medication;Exhibits proper breathing techniques, such as pursed lip breathing or other method taught during program session;Maintenance of O2 saturations>88%;Verbalizes importance of monitoring SPO2 with pulse oximeter and return demonstration;Exhibits compliance with exercise, home  and travel O2 prescription             Initial Exercise Prescription:  Initial Exercise Prescription - 08/22/22 1400       Date of Initial Exercise RX and Referring Provider   Date 08/22/22    Referring Provider Dr. Sherene Sires    Expected Discharge Date 01/03/23      Treadmill   MPH 1.3    Grade 0    Minutes 17      NuStep   Level 1    SPM 60    Minutes 22      Prescription Details   Frequency (times per week) 2    Duration Progress to 30 minutes of continuous aerobic without signs/symptoms of physical distress       Intensity   THRR 40-80% of Max Heartrate 60-119    Ratings of Perceived Exertion 11-13    Perceived Dyspnea 0-4      Resistance Training   Training Prescription Yes    Weight 3    Reps 10-15             Perform Capillary Blood Glucose checks as needed.  Exercise Prescription Changes:   Exercise Prescription Changes     Row Name 08/28/22 1500 09/11/22 1500 09/25/22 1500 10/02/22 1500       Response to Exercise   Blood Pressure (Admit) 112/58 120/60 120/60 112/62    Blood Pressure (Exercise) 126/60 110/58 130/60 124/74    Blood Pressure (Exit) 110/60 120/68 142/62 116/50    Heart Rate (Admit) 83 bpm 78 bpm 80 bpm 87 bpm    Heart Rate (Exercise) 86 bpm 95 bpm 100 bpm 96 bpm    Heart Rate (Exit) 85 bpm 77 bpm 85 bpm 81 bpm    Oxygen Saturation (Admit) 94 % 95 % 94 % 92 %    Oxygen Saturation (Exercise) 92 % 95 % 93 % 90 %    Oxygen Saturation (Exit) 95 % 96 % 94 % 94 %    Rating of Perceived Exertion (Exercise) 12 12 12 12     Perceived Dyspnea (Exercise) 12 12 12  0    Duration Continue with 30 min of aerobic exercise without signs/symptoms of physical distress. Continue with 30 min of aerobic exercise without signs/symptoms of physical distress. Continue with 30 min of aerobic exercise without signs/symptoms of physical distress. Continue with 30 min of aerobic exercise without signs/symptoms of physical distress.    Intensity THRR unchanged THRR unchanged THRR unchanged THRR unchanged      Progression   Progression Continue to progress workloads to maintain intensity without signs/symptoms of physical distress. Continue to progress workloads to maintain intensity without signs/symptoms of physical distress. Continue to progress workloads to maintain intensity without signs/symptoms of physical distress. Continue to progress workloads to maintain intensity without signs/symptoms of physical distress.      Resistance Training   Training Prescription Yes Yes Yes Yes     Weight 2 2 3 3     Reps 10-15 10-15 10-15 10-15    Time 10 Minutes 10  Minutes 10 Minutes --      Treadmill   MPH 1.3 1.5 1.6 1.5    Grade 0 0 0 0    Minutes 17 17 17 15     METs 2 2.15 2.23 2.15      NuStep   Level 1 2 2 2     SPM 40 79 67 55    Minutes 22 22 22 15     METs 1.7 1.9 1.8 1.6      Oxygen   Maintain Oxygen Saturation -- -- -- 88% or higher             Exercise Comments:   Exercise Goals and Review:   Exercise Goals     Row Name 08/22/22 1435 09/10/22 1412 10/09/22 1540         Exercise Goals   Increase Physical Activity Yes Yes Yes     Intervention Provide advice, education, support and counseling about physical activity/exercise needs.;Develop an individualized exercise prescription for aerobic and resistive training based on initial evaluation findings, risk stratification, comorbidities and participant's personal goals. Provide advice, education, support and counseling about physical activity/exercise needs.;Develop an individualized exercise prescription for aerobic and resistive training based on initial evaluation findings, risk stratification, comorbidities and participant's personal goals. Provide advice, education, support and counseling about physical activity/exercise needs.;Develop an individualized exercise prescription for aerobic and resistive training based on initial evaluation findings, risk stratification, comorbidities and participant's personal goals.     Expected Outcomes Short Term: Attend rehab on a regular basis to increase amount of physical activity.;Long Term: Exercising regularly at least 3-5 days a week.;Long Term: Add in home exercise to make exercise part of routine and to increase amount of physical activity. Short Term: Attend rehab on a regular basis to increase amount of physical activity.;Long Term: Exercising regularly at least 3-5 days a week.;Long Term: Add in home exercise to make exercise part of routine and to increase amount  of physical activity. Short Term: Attend rehab on a regular basis to increase amount of physical activity.;Long Term: Exercising regularly at least 3-5 days a week.;Long Term: Add in home exercise to make exercise part of routine and to increase amount of physical activity.     Increase Strength and Stamina Yes Yes Yes     Intervention Provide advice, education, support and counseling about physical activity/exercise needs.;Develop an individualized exercise prescription for aerobic and resistive training based on initial evaluation findings, risk stratification, comorbidities and participant's personal goals. Provide advice, education, support and counseling about physical activity/exercise needs.;Develop an individualized exercise prescription for aerobic and resistive training based on initial evaluation findings, risk stratification, comorbidities and participant's personal goals. Provide advice, education, support and counseling about physical activity/exercise needs.;Develop an individualized exercise prescription for aerobic and resistive training based on initial evaluation findings, risk stratification, comorbidities and participant's personal goals.     Expected Outcomes Short Term: Increase workloads from initial exercise prescription for resistance, speed, and METs.;Short Term: Perform resistance training exercises routinely during rehab and add in resistance training at home;Long Term: Improve cardiorespiratory fitness, muscular endurance and strength as measured by increased METs and functional capacity ( ) Short Term: Increase workloads from initial exercise prescription for resistance, speed, and METs.;Short Term: Perform resistance training exercises routinely during rehab and add in resistance training at home;Long Term: Improve cardiorespiratory fitness, muscular endurance and strength as measured by increased METs and functional capacity ( ) Short Term: Increase workloads from initial  exercise prescription for resistance, speed, and METs.;Short Term: Perform resistance training  exercises routinely during rehab and add in resistance training at home;Long Term: Improve cardiorespiratory fitness, muscular endurance and strength as measured by increased METs and functional capacity ( )     Able to understand and use rate of perceived exertion (RPE) scale Yes Yes Yes     Intervention Provide education and explanation on how to use RPE scale Provide education and explanation on how to use RPE scale Provide education and explanation on how to use RPE scale     Expected Outcomes Short Term: Able to use RPE daily in rehab to express subjective intensity level;Long Term:  Able to use RPE to guide intensity level when exercising independently Short Term: Able to use RPE daily in rehab to express subjective intensity level;Long Term:  Able to use RPE to guide intensity level when exercising independently Short Term: Able to use RPE daily in rehab to express subjective intensity level;Long Term:  Able to use RPE to guide intensity level when exercising independently     Able to understand and use Dyspnea scale Yes Yes Yes     Intervention Provide education and explanation on how to use Dyspnea scale Provide education and explanation on how to use Dyspnea scale Provide education and explanation on how to use Dyspnea scale     Expected Outcomes Short Term: Able to use Dyspnea scale daily in rehab to express subjective sense of shortness of breath during exertion;Long Term: Able to use Dyspnea scale to guide intensity level when exercising independently Short Term: Able to use Dyspnea scale daily in rehab to express subjective sense of shortness of breath during exertion;Long Term: Able to use Dyspnea scale to guide intensity level when exercising independently Short Term: Able to use Dyspnea scale daily in rehab to express subjective sense of shortness of breath during exertion;Long Term: Able to use  Dyspnea scale to guide intensity level when exercising independently     Knowledge and understanding of Target Heart Rate Range (THRR) Yes Yes Yes     Intervention Provide education and explanation of THRR including how the numbers were predicted and where they are located for reference Provide education and explanation of THRR including how the numbers were predicted and where they are located for reference Provide education and explanation of THRR including how the numbers were predicted and where they are located for reference     Expected Outcomes Short Term: Able to state/look up THRR;Long Term: Able to use THRR to govern intensity when exercising independently;Short Term: Able to use daily as guideline for intensity in rehab Short Term: Able to state/look up THRR;Long Term: Able to use THRR to govern intensity when exercising independently;Short Term: Able to use daily as guideline for intensity in rehab Short Term: Able to state/look up THRR;Long Term: Able to use THRR to govern intensity when exercising independently;Short Term: Able to use daily as guideline for intensity in rehab     Understanding of Exercise Prescription Yes Yes Yes     Intervention Provide education, explanation, and written materials on patient's individual exercise prescription Provide education, explanation, and written materials on patient's individual exercise prescription Provide education, explanation, and written materials on patient's individual exercise prescription     Expected Outcomes Short Term: Able to explain program exercise prescription;Long Term: Able to explain home exercise prescription to exercise independently Short Term: Able to explain program exercise prescription;Long Term: Able to explain home exercise prescription to exercise independently Short Term: Able to explain program exercise prescription;Long Term: Able to explain home exercise prescription  to exercise independently               Exercise Goals Re-Evaluation :  Exercise Goals Re-Evaluation     Row Name 09/10/22 1413 10/09/22 1541           Exercise Goal Re-Evaluation   Exercise Goals Review Increase Physical Activity;Increase Strength and Stamina;Able to understand and use rate of perceived exertion (RPE) scale;Able to understand and use Dyspnea scale;Knowledge and understanding of Target Heart Rate Range (THRR);Understanding of Exercise Prescription Increase Physical Activity;Increase Strength and Stamina;Able to understand and use rate of perceived exertion (RPE) scale;Able to understand and use Dyspnea scale;Knowledge and understanding of Target Heart Rate Range (THRR);Understanding of Exercise Prescription      Comments Pt has completed 4 sessions of pulmonary rehab. She is very motivated to be in the program and to improve her health. She is tolerating exercise and increasing her workload. She sometimes has to use the head O2 band due to her nail polish and the meter not reading properly. She is currently exercising at 2.0 METs on the treadmill. Will continue to monitor and progress as able. Pt has completed 10 sessions of pulmonary rehab. She continues to be motivated to be in the program and is enjoying coming to class and exercising. She is increasing her workloads. She is currently exercising at 2.15 METs on the treadmill. Will continue to monitor and progress as able.      Expected Outcomes Through exercise at rehab and home, patient will achieve their goals. Through exercise at rehab and home, patient will achieve their goals.               Discharge Exercise Prescription (Final Exercise Prescription Changes):  Exercise Prescription Changes - 10/02/22 1500       Response to Exercise   Blood Pressure (Admit) 112/62    Blood Pressure (Exercise) 124/74    Blood Pressure (Exit) 116/50    Heart Rate (Admit) 87 bpm    Heart Rate (Exercise) 96 bpm    Heart Rate (Exit) 81 bpm    Oxygen Saturation  (Admit) 92 %    Oxygen Saturation (Exercise) 90 %    Oxygen Saturation (Exit) 94 %    Rating of Perceived Exertion (Exercise) 12    Perceived Dyspnea (Exercise) 0    Duration Continue with 30 min of aerobic exercise without signs/symptoms of physical distress.    Intensity THRR unchanged      Progression   Progression Continue to progress workloads to maintain intensity without signs/symptoms of physical distress.      Resistance Training   Training Prescription Yes    Weight 3    Reps 10-15      Treadmill   MPH 1.5    Grade 0    Minutes 15    METs 2.15      NuStep   Level 2    SPM 55    Minutes 15    METs 1.6      Oxygen   Maintain Oxygen Saturation 88% or higher             Nutrition:  Target Goals: Understanding of nutrition guidelines, daily intake of sodium 1500mg , cholesterol 200mg , calories 30% from fat and 7% or less from saturated fats, daily to have 5 or more servings of fruits and vegetables.  Biometrics:  Pre Biometrics - 08/22/22 1436       Pre Biometrics   Height 5\' 4"  (1.626 m)    Weight 126 lb  15.8 oz (57.6 kg)    Waist Circumference 33 inches    Hip Circumference 37 inches    Waist to Hip Ratio 0.89 %    BMI (Calculated) 21.79    Triceps Skinfold 29 mm    % Body Fat 35.7 %    Grip Strength 21.2 kg    Flexibility 0 in    Single Leg Stand 10.43 seconds              Nutrition Therapy Plan and Nutrition Goals:  Nutrition Therapy & Goals - 10/01/22 1419       Nutrition Therapy   RD appointment deferred Yes      Personal Nutrition Goals   Comments We provide educational sessions on heart healthy nutrition with handouts.      Intervention Plan   Intervention Nutrition handout(s) given to patient.    Expected Outcomes Short Term Goal: Understand basic principles of dietary content, such as calories, fat, sodium, cholesterol and nutrients.             Nutrition Assessments:  Nutrition Assessments - 08/22/22 1336        MEDFICTS Scores   Pre Score 39            MEDIFICTS Score Key: ?70 Need to make dietary changes  40-70 Heart Healthy Diet ? 40 Therapeutic Level Cholesterol Diet   Picture Your Plate Scores: <81 Unhealthy dietary pattern with much room for improvement. 41-50 Dietary pattern unlikely to meet recommendations for good health and room for improvement. 51-60 More healthful dietary pattern, with some room for improvement.  >60 Healthy dietary pattern, although there may be some specific behaviors that could be improved.    Nutrition Goals Re-Evaluation:   Nutrition Goals Discharge (Final Nutrition Goals Re-Evaluation):   Psychosocial: Target Goals: Acknowledge presence or absence of significant depression and/or stress, maximize coping skills, provide positive support system. Participant is able to verbalize types and ability to use techniques and skills needed for reducing stress and depression.  Initial Review & Psychosocial Screening:  Initial Psych Review & Screening - 08/22/22 1328       Initial Review   Current issues with Current Sleep Concerns;Current Anxiety/Panic      Family Dynamics   Good Support System? Yes    Comments Pt has a good support sytem with her husband, her daughters, and her grandchildren.      Barriers   Psychosocial barriers to participate in program There are no identifiable barriers or psychosocial needs.      Screening Interventions   Interventions Encouraged to exercise;Provide feedback about the scores to participant;To provide support and resources with identified psychosocial needs    Expected Outcomes Short Term goal: Utilizing psychosocial counselor, staff and physician to assist with identification of specific Stressors or current issues interfering with healing process. Setting desired goal for each stressor or current issue identified.;Long Term Goal: Stressors or current issues are controlled or eliminated.;Short Term goal:  Identification and review with participant of any Quality of Life or Depression concerns found by scoring the questionnaire.;Long Term goal: The participant improves quality of Life and PHQ9 Scores as seen by post scores and/or verbalization of changes             Quality of Life Scores:  Quality of Life - 08/22/22 1450       Quality of Life   Select Quality of Life      Quality of Life Scores   Health/Function Pre 22.53 %  Socioeconomic Pre 29.17 %    Psych/Spiritual Pre 29.64 %    Family Pre 28.8 %    GLOBAL Pre 26.09 %            Scores of 19 and below usually indicate a poorer quality of life in these areas.  A difference of  2-3 points is a clinically meaningful difference.  A difference of 2-3 points in the total score of the Quality of Life Index has been associated with significant improvement in overall quality of life, self-image, physical symptoms, and general health in studies assessing change in quality of life.   PHQ-9: Review Flowsheet       08/22/2022  Depression screen PHQ 2/9  Decreased Interest 0  Down, Depressed, Hopeless 0  PHQ - 2 Score 0  Altered sleeping 1  Tired, decreased energy 0  Change in appetite 0  Feeling bad or failure about yourself  0  Trouble concentrating 0  Moving slowly or fidgety/restless 0  Suicidal thoughts 0  PHQ-9 Score 1  Difficult doing work/chores Not difficult at all    Details           Interpretation of Total Score  Total Score Depression Severity:  1-4 = Minimal depression, 5-9 = Mild depression, 10-14 = Moderate depression, 15-19 = Moderately severe depression, 20-27 = Severe depression   Psychosocial Evaluation and Intervention:  Psychosocial Evaluation - 08/22/22 1419       Psychosocial Evaluation & Interventions   Interventions Encouraged to exercise with the program and follow exercise prescription;Stress management education;Relaxation education    Comments Pt has no identifiable psychosocial  barriers to completing the PR program.  She is excited to start the program and improve her overall health.  She has a good support system in her husband, daughters, and grandchildren.  She scored a 1 on her PHQ-9 related to the fact that she has trouble falling asleep at times, but the pt does take a Xanax at night as needed to help her sleep and reduce anxiety.  Right now the pt feels like the Xanax is controlling her anxiety and insomnia.  She quit smoking in March of 2023 after smoking for 55 years.  She does have some lower back pain related to arthritis, and she is still having "heaviness" in her legs.  She is able to manage her back pain with Tylenol and stretching, and she hopes that once she loses some weight the leg "heaviness" will subside.  Her goals for the program are to breathe better with less shortness of breath and be able to walk/shop more.  We will monitor her progress as she works toward meeting these goals.    Expected Outcomes Pt's anxiety/insomnia will continue to be managed with Xanax, and she will continue to have no idenfiable psychosocial issues.    Continue Psychosocial Services  No Follow up required             Psychosocial Re-Evaluation:  Psychosocial Re-Evaluation     Row Name 09/04/22 1155 10/01/22 1420           Psychosocial Re-Evaluation   Current issues with Current Sleep Concerns Current Sleep Concerns      Comments Patient is new to the program completing 2 sessions.  She is currently is being treated via PCP for insomnia with xanax prn.  Patient was referred to PR by Dr. 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  Her initial PHQ-9 score is 1 and her QOL score  is 26.09%.  We will continue to monitor  her progress as she works toward meeting these goals. Patient has completed 9 sessions. Her insomina is managed with Xanax prn. He continues to have no psychosocial barriers identified. She enjoys the program and demonstrates an interest in  improving her health. We will continue to monitor her progress.      Expected Outcomes Patient will have no psychosocial barriers identified at discharge. Patient will have no psychosocial barriers identified at discharge.      Interventions Relaxation education;Encouraged to attend Pulmonary Rehabilitation for the exercise;Stress management education Relaxation education;Encouraged to attend Pulmonary Rehabilitation for the exercise;Stress management education      Continue Psychosocial Services  No Follow up required No Follow up required               Psychosocial Discharge (Final Psychosocial Re-Evaluation):  Psychosocial Re-Evaluation - 10/01/22 1420       Psychosocial Re-Evaluation   Current issues with Current Sleep Concerns    Comments Patient has completed 9 sessions. Her insomina is managed with Xanax prn. He continues to have no psychosocial barriers identified. She enjoys the program and demonstrates an interest in improving her health. We will continue to monitor her progress.    Expected Outcomes Patient will have no psychosocial barriers identified at discharge.    Interventions Relaxation education;Encouraged to attend Pulmonary Rehabilitation for the exercise;Stress management education    Continue Psychosocial Services  No Follow up required              Education: Education Goals: Education classes will be provided on a weekly basis, covering required topics. Participant will state understanding/return demonstration of topics presented.  Learning Barriers/Preferences:  Learning Barriers/Preferences - 08/22/22 1336       Learning Barriers/Preferences   Learning Barriers None    Learning Preferences Written Material             Education Topics: How Lungs Work and Diseases: - Discuss the anatomy of the lungs and diseases that can affect the lungs, such as COPD.   Exercise: -Discuss the importance of exercise, FITT principles of exercise, normal and  abnormal responses to exercise, and how to exercise safely.   Environmental Irritants: -Discuss types of environmental irritants and how to limit exposure to environmental irritants.   Meds/Inhalers and oxygen: - Discuss respiratory medications, definition of an inhaler and oxygen, and the proper way to use an inhaler and oxygen.   Energy Saving Techniques: - Discuss methods to conserve energy and decrease shortness of breath when performing activities of daily living.    Bronchial Hygiene / Breathing Techniques: - Discuss breathing mechanics, pursed-lip breathing technique,  proper posture, effective ways to clear airways, and other functional breathing techniques   Cleaning Equipment: - Provides group verbal and written instruction about the health risks of elevated stress, cause of high stress, and healthy ways to reduce stress.   Nutrition I: Fats: - Discuss the types of cholesterol, what cholesterol does to the body, and how cholesterol levels can be controlled.   Nutrition II: Labels: -Discuss the different components of food labels and how to read food labels.   Respiratory Infections: - Discuss the signs and symptoms of respiratory infections, ways to prevent respiratory infections, and the importance of seeking medical treatment when having a respiratory infection. Flowsheet Row PULMONARY REHAB OTHER RESPIRATORY from 09/20/2022 in [facility]  Date 08/30/22  Educator Handout       Stress I: Signs and Symptoms: -  Discuss the causes of stress, how stress may lead to anxiety and depression, and ways to limit stress. Flowsheet Row PULMONARY REHAB OTHER RESPIRATORY from 09/20/2022 in [place] [facility]  Date 09/06/22  Educator handout       Stress II: Relaxation: -Discuss relaxation techniques to limit stress. Flowsheet Row PULMONARY REHAB OTHER RESPIRATORY from 09/20/2022 in [place] [facility]  Date 09/13/22   Educator handout       Oxygen for Home/Travel: - Discuss how to prepare for travel when on oxygen and proper ways to transport and store oxygen to ensure safety. Flowsheet Row PULMONARY REHAB OTHER RESPIRATORY from 09/20/2022 in [facility] CARDIAC REHABILIT[facility]  Date 09/20/22  Educator handout       Knowledge Questionnaire Score:  Knowledge Questionnaire Score - 08/22/22 1337       Knowledge Questionnaire Score   Pre Score 12/18             Core Components/Risk Factors/Patient Goals at Admission:  Personal Goals and Risk Factors at Admission - 08/22/22 1342       Core Components/Risk Factors/Patient Goals on Admission    Weight Management Weight Loss    Improve shortness of breath with ADL's Yes    Intervention Provide education, individualized exercise plan and daily activity instruction to help decrease symptoms of SOB with activities of daily living.    Expected Outcomes Short Term: Improve cardiorespiratory fitness to achieve a reduction of symptoms when performing ADLs;Long Term: Be able to perform more ADLs without symptoms or delay the onset of symptoms    Increase knowledge of respiratory medications and ability to use respiratory devices properly  Yes    Intervention Provide education and demonstration as needed of appropriate use of medications, inhalers, and oxygen therapy.    Expected Outcomes Short Term: Achieves understanding of medications use. Understands that oxygen is a medication prescribed by physician. Demonstrates appropriate use of inhaler and oxygen therapy.;Long Term: Maintain appropriate use of medications, inhalers, and oxygen therapy.    Hypertension Yes    Intervention Provide education on lifestyle modifcations including regular physical activity/exercise, weight management, moderate sodium restriction and increased consumption of fresh fruit, vegetables, and low fat dairy, alcohol moderation, and smoking cessation.    Expected Outcomes Short  Term: Continued assessment and intervention until BP is < 140/28mm HG in hypertensive participants. < 130/52mm HG in hypertensive participants with diabetes, heart failure or chronic kidney disease.;Long Term: Maintenance of blood pressure at goal levels.    Personal Goal Other Yes    Personal Goal Pt wants to breathe better with less shortness of breath and be able to walk more and shop.    Intervention Pt will attend PR twice a week and start a home exercise program.    Expected Outcomes Pt will attend PR twice a week and complete the program meeting both her personal and program goals.             Core Components/Risk Factors/Patient Goals Review:   Goals and Risk Factor Review     Row Name 09/04/22 1206 10/01/22 1423           Core Components/Risk Factors/Patient Goals Review   Personal Goals Review Improve shortness of breath with ADL's;Develop more efficient breathing techniques such as purse lipped breathing and diaphragmatic breathing and practicing self-pacing with activity. Improve shortness of breath with ADL's;Develop more efficient breathing techniques such as purse lipped breathing and diaphragmatic breathing and practicing self-pacing with activity.      Review  Patient is new to the PR program attending 2 sessions.  She seems to enjoy class and interactive with class and staff.  Patient was referred to PR by Dr. 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  Her personal goals for the program are to have less SOB , breath better and be abe to walk more while shopping.  She tolerates exercise well while on Treadmill and nu-step with O2 sats maintaining at 92% without any oxygen supplemental needed at this time.  We will continue to monitor as she works toward meeting these goals as she progresses in the program. Patient had completed 9 sessions. She is doing well in the program with consistent attendance. Her current weight is 57.3 KG down 0.9 KG from last 30  day review. She tolerates exercise well while on Treadmill and nu-step with O2 sats maintaining at 93% without any oxygen supplemental needed at this time. Her personal goals for the program are to have less SOB , breath better and be abe to walk more while shopping. We will continue to monitor as she works toward meeting these goals as she progresses in the program.      Expected Outcomes Patient will complete the program meeting both program and personal goals. Patient will complete the program meeting both program and personal goals.               Core Components/Risk Factors/Patient Goals at Discharge (Final Review):   Goals and Risk Factor Review - 10/01/22 1423       Core Components/Risk Factors/Patient Goals Review   Personal Goals Review Improve shortness of breath with ADL's;Develop more efficient breathing techniques such as purse lipped breathing and diaphragmatic breathing and practicing self-pacing with activity.    Review Patient had completed 9 sessions. She is doing well in the program with consistent attendance. Her current weight is 57.3 KG down 0.9 KG from last 30 day review. She tolerates exercise well while on Treadmill and nu-step with O2 sats maintaining at 93% without any oxygen supplemental needed at this time. Her personal goals for the program are to have less SOB , breath better and be abe to walk more while shopping. We will continue to monitor as she works toward meeting these goals as she progresses in the program.    Expected Outcomes Patient will complete the program meeting both program and personal goals.             ITP Comments:  ITP Comments     Row Name 10/10/22 1613           ITP Comments 30 day review completed. ITP sent to Dr.Jehanzeb Memon, Medical Director of  Pulmonary Rehab. Continue with ITP unless changes are made by physician.                Comments: 30 day review

## 2022-10-11 ENCOUNTER — Encounter (HOSPITAL_COMMUNITY): Payer: Medicare Other

## 2022-10-16 ENCOUNTER — Encounter (HOSPITAL_COMMUNITY)
Admission: RE | Admit: 2022-10-16 | Discharge: 2022-10-16 | Disposition: A | Discharge status: Home / Self Care | Payer: Medicare Other | Source: Ambulatory Visit | Attending: Internal Medicine | Admitting: Internal Medicine

## 2022-10-16 DIAGNOSIS — J9611 Chronic respiratory failure with hypoxia: Secondary | ICD-10-CM | POA: Diagnosis not present

## 2022-10-16 DIAGNOSIS — J449 Chronic obstructive pulmonary disease, unspecified: Secondary | ICD-10-CM

## 2022-10-16 NOTE — Progress Notes (Signed)
Daily Session Note  Patient Details  Name: Desiree Barber MRN: [id number] Date of Birth: 09-Sep-1950 Referring Provider:   Flowsheet Row PULMONARY REHAB OTHER RESP ORIENTATION from 08/22/2022 in [facility] CARDIAC REH[facility]ILIT[facility]  Referring Provider Dr. Sherene Sires       Encounter Date: 10/16/2022  Check In:  Session Check In - 10/16/22 1445       Check-In   Supervising physician immediately available to respond to emergencies See telemetry face sheet for immediately available MD    Location [facility]    Staff Present Ross Ludwig, BS, Exercise Physiologist;Daphyne Daphine Deutscher, RN, BSN    Virtual Visit No    Medication changes reported     No    Fall or balance concerns reported    No    Tobacco Cessation No Change    Warm-up and Cool-down Performed on first and last piece of equipment    Resistance Training Performed Yes    VAD Patient? No    PAD/SET Patient? No      Pain Assessment   Currently in Pain? No/denies    Pain Score 0-No pain    Multiple Pain Sites No             Capillary Blood Glucose: No results found for this or any previous visit (from the past 24 hour(s)).    Social History   Tobacco Use  Smoking Status Former   Current packs/day: 0.00   Average packs/day: 0.5 packs/day for 55.0 years (27.5 ttl pk-yrs)   Types: Cigarettes   Start date: 05/30/1966   Quit date: 05/29/2021   Years since quitting: 1.3   Passive exposure: Past  Smokeless Tobacco Former  Tobacco Comments   Quit date 05/29/2021    Goals Met:  Independence with exercise equipment Exercise tolerated well No report of concerns or symptoms today Strength training completed today  Goals Unmet:  Not Applicable  Comments: Pt able to follow exercise prescription today without complaint.  Will continue to monitor for progression.    Dr. Erick Blinks is Medical Director for [facility] Pulmonary Rehab.

## 2022-10-18 ENCOUNTER — Encounter (HOSPITAL_COMMUNITY)
Admission: RE | Admit: 2022-10-18 | Discharge: 2022-10-18 | Disposition: A | Discharge status: Home / Self Care | Payer: Medicare Other | Source: Ambulatory Visit | Attending: Internal Medicine | Admitting: Internal Medicine

## 2022-10-18 DIAGNOSIS — F419 Anxiety disorder, unspecified: Secondary | ICD-10-CM | POA: Diagnosis not present

## 2022-10-18 DIAGNOSIS — J9611 Chronic respiratory failure with hypoxia: Secondary | ICD-10-CM | POA: Diagnosis not present

## 2022-10-18 DIAGNOSIS — Z6822 Body mass index (BMI) 22.0-22.9, adult: Secondary | ICD-10-CM | POA: Diagnosis not present

## 2022-10-18 DIAGNOSIS — J449 Chronic obstructive pulmonary disease, unspecified: Secondary | ICD-10-CM

## 2022-10-18 NOTE — Progress Notes (Signed)
Daily Session Note  Patient Details  Name: KEYSHAWNA PROUSE MRN: [id number] Date of Birth: 09-16-1950 Referring Provider:   Flowsheet Row PULMONARY REHAB OTHER RESP ORIENTATION from 08/22/2022 in [facility] CARD[facility]AC RE[facility]  Referring Provider Dr. Sherene Sires       Encounter Date: 10/18/2022  Check In:  Session Check In - 10/18/22 1452       Check-In   Supervising physician immediately available to respond to emergencies See telemetry face sheet for immediately available MD    Location [facility]    Staff Present Ross Ludwig, BS, Exercise Physiologist;Rudi Bunyard Daphine Deutscher, RN, BSN;Hillary Troutman BSN, RN    Virtual Visit No    Medication changes reported     No    Fall or balance concerns reported    No    Tobacco Cessation No Change    Warm-up and Cool-down Performed on first and last piece of equipment    Resistance Training Performed Yes    VAD Patient? No      Pain Assessment   Currently in Pain? No/denies    Pain Score 0-No pain             Capillary Blood Glucose: No results found for this or any previous visit (from the past 24 hour(s)).    Social History   Tobacco Use  Smoking Status Former   Current packs/day: 0.00   Average packs/day: 0.5 packs/day for 55.0 years (27.5 ttl pk-yrs)   Types: Cigarettes   Start date: 05/30/1966   Quit date: 05/29/2021   Years since quitting: 1.3   Passive exposure: Past  Smokeless Tobacco Former  Tobacco Comments   Quit date 05/29/2021    Goals Met:  Proper associated with RPD/PD & O2 Sat Independence with exercise equipment Using PLB without cueing & demonstrates good technique Exercise tolerated well Queuing for purse lip breathing No report of concerns or symptoms today Strength training completed today  Goals Unmet:  Not Applicable  Comments: Pt able to follow exercise prescription today without complaint.  Will continue to monitor for progression.    Dr. Erick Blinks is Medical Director  for [facility] Pulmonary Rehab.

## 2022-10-23 ENCOUNTER — Encounter (HOSPITAL_COMMUNITY): Admission: RE | Admit: 2022-10-23 | Payer: Medicare Other | Source: Ambulatory Visit

## 2022-10-23 DIAGNOSIS — J9611 Chronic respiratory failure with hypoxia: Secondary | ICD-10-CM

## 2022-10-23 DIAGNOSIS — J449 Chronic obstructive pulmonary disease, unspecified: Secondary | ICD-10-CM | POA: Diagnosis not present

## 2022-10-23 NOTE — Progress Notes (Signed)
Daily Session Note  Patient Details  Name: Desiree Barber MRN: [id number] Date of Birth: 1950-07-01 Referring Provider:   Flowsheet Row PULMONARY REHAB OTHER RESP ORIENTATION from 08/22/2022 in [facility] CARDIAC REHABILITATION  Referring Provider Dr. Sherene Sires       Encounter Date: 10/23/2022  Check In:  Session Check In - 10/23/22 1500       Check-In   Supervising physician immediately available to respond to emergencies See telemetry face sheet for immediately available MD    Location [facility]    Staff Present Ross Ludwig, BS, Exercise Physiologist;Brad Lieurance Daphine Deutscher, RN, BSN    Virtual Visit No    Medication changes reported     No    Fall or balance concerns reported    No    Tobacco Cessation No Change    Warm-up and Cool-down Performed on first and last piece of equipment    Resistance Training Performed Yes    VAD Patient? No      Pain Assessment   Currently in Pain? No/denies    Pain Score 0-No pain             Capillary Blood Glucose: No results found for this or any previous visit (from the past 24 hour(s)).    Social History   Tobacco Use  Smoking Status Former   Current packs/day: 0.00   Average packs/day: 0.5 packs/day for 55.0 years (27.5 ttl pk-yrs)   Types: Cigarettes   Start date: 05/30/1966   Quit date: 05/29/2021   Years since quitting: 1.4   Passive exposure: Past  Smokeless Tobacco Former  Tobacco Comments   Quit date 05/29/2021    Goals Met:  Proper associated with RPD/PD & O2 Sat Independence with exercise equipment Using PLB without cueing & demonstrates good technique Exercise tolerated well Queuing for purse lip breathing No report of concerns or symptoms today Strength training completed today  Goals Unmet:  Not Applicable  Comments: Pt able to follow exercise prescription today without complaint.  Will continue to monitor for progression.    Dr. Erick Blinks is Medical Director for [facility] Pulmonary  Rehab.

## 2022-10-25 ENCOUNTER — Encounter (HOSPITAL_COMMUNITY)
Admission: RE | Admit: 2022-10-25 | Discharge: 2022-10-25 | Disposition: A | Discharge status: Home / Self Care | Payer: Medicare Other | Source: Ambulatory Visit | Attending: Internal Medicine | Admitting: Internal Medicine

## 2022-10-25 DIAGNOSIS — J449 Chronic obstructive pulmonary disease, unspecified: Secondary | ICD-10-CM | POA: Insufficient documentation

## 2022-10-25 DIAGNOSIS — J9611 Chronic respiratory failure with hypoxia: Secondary | ICD-10-CM | POA: Diagnosis not present

## 2022-10-25 NOTE — Progress Notes (Signed)
Daily Session Note  Patient Details  Name: Desiree Barber MRN: [id number] Date of Birth: 07-Jan-1951 Referring Provider:   Flowsheet Row PULMONARY REHAB OTHER RESP ORIENTATION from 08/22/2022 in [facility] RE[facility]  Referring Provider Dr. Sherene Sires       Encounter Date: 10/25/2022  Check In:  Session Check In - 10/25/22 1500       Check-In   Supervising physician immediately available to respond to emergencies [facility] MD immediately available    Physician(s) Dr. Wyline Mood    Location [facility]    Staff Present Avanell Shackleton BSN, RN;Heather Fredric Mare, BS, Exercise Physiologist    Virtual Visit No    Medication changes reported     No    Fall or balance concerns reported    No    Tobacco Cessation No Change    Warm-up and Cool-down Performed on first and last piece of equipment    Resistance Training Performed Yes    VAD Patient? No    PAD/SET Patient? No      Pain Assessment   Currently in Pain? No/denies    Pain Score 0-No pain    Multiple Pain Sites No             Capillary Blood Glucose: No results found for this or any previous visit (from the past 24 hour(s)).    Social History   Tobacco Use  Smoking Status Former   Current packs/day: 0.00   Average packs/day: 0.5 packs/day for 55.0 years (27.5 ttl pk-yrs)   Types: Cigarettes   Start date: 05/30/1966   Quit date: 05/29/2021   Years since quitting: 1.4   Passive exposure: Past  Smokeless Tobacco Former  Tobacco Comments   Quit date 05/29/2021    Goals Met:  Proper associated with RPD/PD & O2 Sat Independence with exercise equipment Using PLB without cueing & demonstrates good technique Exercise tolerated well Queuing for purse lip breathing No report of concerns or symptoms today Strength training completed today  Goals Unmet:  Not Applicable  Comments: .Pt able to follow exercise prescription today without complaint.  Will continue to monitor for progression.    Dr.  Erick Blinks is Medical Director for [facility] Pulmonary Rehab.

## 2022-10-30 ENCOUNTER — Encounter (HOSPITAL_COMMUNITY)
Admission: RE | Admit: 2022-10-30 | Discharge: 2022-10-30 | Disposition: A | Discharge status: Home / Self Care | Payer: Medicare Other | Source: Ambulatory Visit | Attending: Internal Medicine | Admitting: Internal Medicine

## 2022-10-30 DIAGNOSIS — J9611 Chronic respiratory failure with hypoxia: Secondary | ICD-10-CM

## 2022-10-30 DIAGNOSIS — J449 Chronic obstructive pulmonary disease, unspecified: Secondary | ICD-10-CM

## 2022-10-30 NOTE — Progress Notes (Signed)
Daily Session Note  Patient Details  Name: Desiree Barber MRN: [id number] Date of Birth: [birth year] Referring Provider:   Flowsheet Row PULMONARY REHAB OTHER RESP ORIENTATION from 08/22/2022 in [facility] CARDIAC REHABILITATION  Referring Provider Dr. Sherene Sires       Encounter Date: 10/30/2022  Check In:  Session Check In - 10/30/22 1500       Check-In   Supervising physician immediately available to respond to emergencies See telemetry face sheet for immediately available MD    Physician(s) Dr. Jenene Slicker    Location [facility]    Staff Present Erskine Speed, RN;Jessica Juanetta Gosling, MA, RCEP, CCRP, CCET;Daphyne Daphine Deutscher, RN, BSN    Virtual Visit No    Medication changes reported     No    Fall or balance concerns reported    No    Tobacco Cessation No Change    Warm-up and Cool-down Performed on first and last piece of equipment    Resistance Training Performed Yes    VAD Patient? No    PAD/SET Patient? No      Pain Assessment   Currently in Pain? No/denies    Pain Score 0-No pain    Multiple Pain Sites No             Capillary Blood Glucose: No results found for this or any previous visit (from the past 24 hour(s)).    Social History   Tobacco Use  Smoking Status Former   Current packs/day: 0.00   Average packs/day: 0.5 packs/day for 55.0 years (27.5 ttl pk-yrs)   Types: Cigarettes   Start date: 05/30/1966   Quit date: 05/29/2021   Years since quitting: 1.4   Passive exposure: Past  Smokeless Tobacco Former  Tobacco Comments   Quit date 05/29/2021    Goals Met:  Proper associated with RPD/PD & O2 Sat Independence with exercise equipment Using PLB without cueing & demonstrates good technique Exercise tolerated well No report of concerns or symptoms today Strength training completed today  Goals Unmet:  Not Applicable  Comments: Pt able to follow exercise prescription today without complaint.  Will continue to monitor for progression.     Dr. Erick Blinks is Medical Director for [facility].

## 2022-11-01 ENCOUNTER — Encounter (HOSPITAL_COMMUNITY)
Admission: RE | Admit: 2022-11-01 | Discharge: 2022-11-01 | Disposition: A | Discharge status: Home / Self Care | Payer: Medicare Other | Source: Ambulatory Visit | Attending: Internal Medicine | Admitting: Internal Medicine

## 2022-11-01 DIAGNOSIS — J449 Chronic obstructive pulmonary disease, unspecified: Secondary | ICD-10-CM

## 2022-11-01 DIAGNOSIS — J9611 Chronic respiratory failure with hypoxia: Secondary | ICD-10-CM | POA: Diagnosis not present

## 2022-11-01 NOTE — Progress Notes (Signed)
Daily Session Note  Patient Details  Name: Desiree Barber MRN: [id number] Date of Birth: 09-Sep-1950 Referring Provider:   Flowsheet Row PULMONARY REHAB OTHER RESP ORIENTATION from 08/22/2022 in [facility] CARDIAC REHABILITATION  Referring Provider Dr. Sherene Sires       Encounter Date: 11/01/2022  Check In:  Session Check In - 11/01/22 1445       Check-In   Supervising physician immediately available to respond to emergencies See telemetry face sheet for immediately available ER MD    Location [facility]    Staff Present Rodena Medin, RN, Pleas Koch, RN, BSN    Virtual Visit No    Medication changes reported     No    Fall or balance concerns reported    No    Warm-up and Cool-down Performed on first and last piece of equipment    Resistance Training Performed Yes    VAD Patient? No    PAD/SET Patient? No      Pain Assessment   Currently in Pain? No/denies    Pain Score 0-No pain    Multiple Pain Sites No             Capillary Blood Glucose: No results found for this or any previous visit (from the past 24 hour(s)).    Social History   Tobacco Use  Smoking Status Former   Current packs/day: 0.00   Average packs/day: 0.5 packs/day for 55.0 years (27.5 ttl pk-yrs)   Types: Cigarettes   Start date: 05/30/1966   Quit date: 05/29/2021   Years since quitting: 1.4   Passive exposure: Past  Smokeless Tobacco Former  Tobacco Comments   Quit date 05/29/2021    Goals Met:  Proper associated with RPD/PD & O2 Sat Independence with exercise equipment Using PLB without cueing & demonstrates good technique Exercise tolerated well No report of concerns or symptoms today Strength training completed today  Goals Unmet:  Not Applicable  Comments:Pt able to follow exercise prescription today without complaint.  Will continue to monitor for progression.     Dr. Erick Blinks is Medical Director for [facility].

## 2022-11-06 ENCOUNTER — Encounter (HOSPITAL_COMMUNITY): Payer: Medicare Other

## 2022-11-07 ENCOUNTER — Encounter (HOSPITAL_COMMUNITY): Payer: Self-pay | Admitting: *Deleted

## 2022-11-07 DIAGNOSIS — J9611 Chronic respiratory failure with hypoxia: Secondary | ICD-10-CM

## 2022-11-07 DIAGNOSIS — J449 Chronic obstructive pulmonary disease, unspecified: Secondary | ICD-10-CM

## 2022-11-07 NOTE — Progress Notes (Signed)
Pulmonary Individual Treatment Plan  Patient Details  Name: Desiree Barber MRN: [id number] Date of Birth: 1950-12-06 Referring Provider:   Flowsheet Row PULMONARY REHAB OTHER RESP ORIENTATION from 08/22/2022 in [facility] CARDIAC REHABILITATION  Referring Provider Dr. Sherene Sires       Initial Encounter Date:  Flowsheet Row PULMONARY REHAB OTHER RESP ORIENTATION from 08/22/2022 in [facility] CARDIAC REH[facility]  Date 08/22/22       Visit Diagnosis: Chronic respiratory failure with hypoxia (HCC)  COPD mixed type (HCC)  Patient's Home Medications on Admission:   Current Outpatient Medications:    albuterol (PROVENTIL) (2.5 MG/3ML) 0.083% nebulizer solution, Up to every 4 hours as needed, Disp: 75 mL, Rfl: 12   albuterol (VENTOLIN HFA) 108 (90 Base) MCG/ACT inhaler, Inhale into the lungs. (Patient not taking: Reported on 08/22/2022), Disp: , Rfl:    ALPRAZolam (XANAX) 0.25 MG tablet, Take 0.25 mg by mouth daily as needed for anxiety or sleep., Disp: , Rfl:    Ascorbic Acid (VITAMIN C) 100 MG tablet, Take 100 mg by mouth daily., Disp: , Rfl:    clopidogrel (PLAVIX) 75 MG tablet, Take 1 tablet (75 mg total) by mouth daily., Disp: 30 tablet, Rfl: 11   losartan (COZAAR) 25 MG tablet, Take 1 tablet (25 mg total) by mouth daily., Disp: 90 tablet, Rfl: 2   pantoprazole (PROTONIX) 40 MG tablet, Take 1 tablet (40 mg total) by mouth 2 (two) times daily., Disp: 60 tablet, Rfl: 11   TRELEGY ELLIPTA 100-62.5-25 MCG/ACT AEPB, Inhale 1 puff into the lungs daily., Disp: , Rfl:    umeclidinium-vilanterol (ANORO ELLIPTA) 62.5-25 MCG/ACT AEPB, Inhale 1 puff into the lungs daily., Disp: 1 each, Rfl: 11   vitamin B-12 (CYANOCOBALAMIN) 100 MCG tablet, Take 100 mcg by mouth daily., Disp: , Rfl:    VITAMIN D, CHOLECALCIFEROL, PO, Take 1 tablet by mouth daily. One daily, Disp: , Rfl:   Past Medical History: Past Medical History:  Diagnosis Date   Anemia    Anxiety    Occasional   Arthritis    COPD  (chronic obstructive pulmonary disease) (HCC)    HTN (hypertension)    Iron deficiency anemia due to chronic blood loss 03/28/2022    Tobacco Use: Social History   Tobacco Use  Smoking Status Former   Current packs/day: 0.00   Average packs/day: 0.5 packs/day for 55.0 years (27.5 ttl pk-yrs)   Types: Cigarettes   Start date: 05/30/1966   Quit date: 05/29/2021   Years since quitting: 1.4   Passive exposure: Past  Smokeless Tobacco Former  Tobacco Comments   Quit date 05/29/2021    Labs: Review Flowsheet       Latest Ref Rng & Units 03/01/2022 04/10/2022  Labs for ITP Cardiac and Pulmonary Rehab  Cholestrol 0 - 200 mg/dL 409  -  LDL (calc) 0 - 99 mg/dL 65  -  HDL-C >81 mg/dL 77  -  Trlycerides <191 mg/dL 38  -  TCO2 22 - 32 mmol/L - 28     Details            Capillary Blood Glucose: No results found for: "GLUCAP"   Pulmonary Assessment Scores:  Pulmonary Assessment Scores     Row Name 08/22/22 1314         ADL UCSD   SOB Score total 34     Rest 0     Walk 3     Stairs 4     Bath 0     Dress  0     Shop 2       CAT Score   CAT Score 15       mMRC Score   mMRC Score 1             UCSD: Self-administered rating of dyspnea associated with activities of daily living (ADLs) 6-point scale (0 = "not at all" to 5 = "maximal or unable to do because of breathlessness")  Scoring Scores range from 0 to 120.  Minimally important difference is 5 units  CAT: CAT can identify the health impairment of COPD patients and is better correlated with disease progression.  CAT has a scoring range of zero to 40. The CAT score is classified into four groups of low (less than 10), medium (10 - 20), high (21-30) and very high (31-40) based on the impact level of disease on health status. A CAT score over 10 suggests significant symptoms.  A worsening CAT score could be explained by an exacerbation, poor medication adherence, poor inhaler technique, or progression of COPD or  comorbid conditions.  CAT MCID is 2 points  mMRC: mMRC (Modified Medical Research Council) Dyspnea Scale is used to assess the degree of baseline functional disability in patients of respiratory disease due to dyspnea. No minimal important difference is established. A decrease in score of 1 point or greater is considered a positive change.   Pulmonary Function Assessment:   Exercise Target Goals: Exercise Program Goal: Individual exercise prescription set using results from initial 6 min walk test and THRR while considering  patient's activity barriers and safety.   Exercise Prescription Goal: Initial exercise prescription builds to 30-45 minutes a day of aerobic activity, 2-3 days per week.  Home exercise guidelines will be given to patient during program as part of exercise prescription that the participant will acknowledge.  Activity Barriers & Risk Stratification:  Activity Barriers & Cardiac Risk Stratification - 08/22/22 1322       Activity Barriers & Cardiac Risk Stratification   Activity Barriers Arthritis;Back Problems;Neck/Spine Problems;Joint Problems;Deconditioning;Muscular Weakness;Shortness of Breath    Cardiac Risk Stratification High             6 Minute Walk:  6 Minute Walk     Row Name 08/22/22 1432         6 Minute Walk   Phase Initial     Distance 800 feet     Walk Time 6 minutes     # of Rest Breaks 1     MPH 1.5     METS 2.38     RPE 13     Perceived Dyspnea  13     VO2 Peak 8.33     Symptoms Yes (comment)     Comments pt needed one sittting break due to SOB     Resting HR 81 bpm     Resting BP 134/60     Resting Oxygen Saturation  92 %     Exercise Oxygen Saturation  during 6 min walk 80 %     Max Ex. HR 109 bpm     Max Ex. BP 140/68     2 Minute Post BP 136/60       Interval HR   1 Minute HR 93     2 Minute HR 104     3 Minute HR 103     4 Minute HR 109     5 Minute HR 103     6 Minute HR 105  2 Minute Post HR 91      Interval Heart Rate? Yes       Interval Oxygen   Interval Oxygen? Yes     Baseline Oxygen Saturation % 92 %     1 Minute Oxygen Saturation % 90 %     1 Minute Liters of Oxygen 0 L     2 Minute Oxygen Saturation % 85 %     2 Minute Liters of Oxygen 0 L     3 Minute Oxygen Saturation % 88 %     3 Minute Liters of Oxygen 0 L     4 Minute Oxygen Saturation % 83 %     5 Minute Oxygen Saturation % 81 %     6 Minute Oxygen Saturation % 80 %     6 Minute Liters of Oxygen 0 L     2 Minute Post Oxygen Saturation % 92 %     2 Minute Post Liters of Oxygen 0 L              Oxygen Initial Assessment:  Oxygen Initial Assessment - 08/22/22 1437       Initial 6 min Walk   Oxygen Used None      Program Oxygen Prescription   Program Oxygen Prescription None   pt did not have accuret reading due to nail polish, will be using forhead sensor if nail polish isnt removed            Oxygen Re-Evaluation:  Oxygen Re-Evaluation     Row Name 10/09/22 1544 10/18/22 1522 10/26/22 1239         Program Oxygen Prescription   Program Oxygen Prescription None None --       Home Oxygen   Home Oxygen Device Portable Concentrator Portable Concentrator --     Sleep Oxygen Prescription None None --     Home Exercise Oxygen Prescription Pulsed Pulsed --     Liters per minute 2 2 --     Home Resting Oxygen Prescription None None --     Compliance with Home Oxygen Use -- Yes --       Goals/Expected Outcomes   Short Term Goals To learn and exhibit compliance with exercise, home and travel O2 prescription;To learn and understand importance of monitoring SPO2 with pulse oximeter and demonstrate accurate use of the pulse oximeter.;To learn and understand importance of maintaining oxygen saturations>88%;To learn and demonstrate proper pursed lip breathing techniques or other breathing techniques. ;To learn and demonstrate proper use of respiratory medications To learn and exhibit compliance with exercise,  home and travel O2 prescription;To learn and understand importance of monitoring SPO2 with pulse oximeter and demonstrate accurate use of the pulse oximeter.;To learn and understand importance of maintaining oxygen saturations>88%;To learn and demonstrate proper pursed lip breathing techniques or other breathing techniques. ;To learn and demonstrate proper use of respiratory medications To learn and demonstrate proper pursed lip breathing techniques or other breathing techniques.      Long  Term Goals Compliance with respiratory medication;Exhibits proper breathing techniques, such as pursed lip breathing or other method taught during program session;Maintenance of O2 saturations>88%;Verbalizes importance of monitoring SPO2 with pulse oximeter and return demonstration;Exhibits compliance with exercise, home  and travel O2 prescription Compliance with respiratory medication;Exhibits proper breathing techniques, such as pursed lip breathing or other method taught during program session;Maintenance of O2 saturations>88%;Verbalizes importance of monitoring SPO2 with pulse oximeter and return demonstration;Exhibits compliance with exercise, home  and travel O2 prescription  Exhibits proper breathing techniques, such as pursed lip breathing or other method taught during program session     Comments -- Went to Dr to make sure conentrator was good and aproval to take on airplane for trip to [place]. Asked her about her pursed lip breathing when needed ans she stated that she didnt use it but also sounded that she did not fully understand. Her O2 states are maintaning above 88% and knows how to take her SpO2 with portable pulse ox. Diaphragmatic and PLB breathing explained and performed with patient. Patient has a better understanding of how to do these exercises to help with breathing performance and relaxation. Patient performed breathing techniques adequately and to practice further at home.     Goals/Expected Outcomes  -- short term goal: reminders about PLB   Long term goal: continue to monitor stats at home and on trip. Short: practice PLB and diaphragmatic breathing at home. Long: Use PLB and diaphragmatic breathing independently              Oxygen Discharge (Final Oxygen Re-Evaluation):  Oxygen Re-Evaluation - 10/26/22 1239       Goals/Expected Outcomes   Short Term Goals To learn and demonstrate proper pursed lip breathing techniques or other breathing techniques.     Long  Term Goals Exhibits proper breathing techniques, such as pursed lip breathing or other method taught during program session    Comments Diaphragmatic and PLB breathing explained and performed with patient. Patient has a better understanding of how to do these exercises to help with breathing performance and relaxation. Patient performed breathing techniques adequately and to practice further at home.    Goals/Expected Outcomes Short: practice PLB and diaphragmatic breathing at home. Long: Use PLB and diaphragmatic breathing independently             Initial Exercise Prescription:  Initial Exercise Prescription - 08/22/22 1400       Date of Initial Exercise RX and Referring Provider   Date 08/22/22    Referring Provider Dr. Sherene Sires    Expected Discharge Date 01/03/23      Treadmill   MPH 1.3    Grade 0    Minutes 17      NuStep   Level 1    SPM 60    Minutes 22      Prescription Details   Frequency (times per week) 2    Duration Progress to 30 minutes of continuous aerobic without signs/symptoms of physical distress      Intensity   THRR 40-80% of Max Heartrate 60-119    Ratings of Perceived Exertion 11-13    Perceived Dyspnea 0-4      Resistance Training   Training Prescription Yes    Weight 3    Reps 10-15             Perform Capillary Blood Glucose checks as needed.  Exercise Prescription Changes:   Exercise Prescription Changes     Row Name 08/28/22 1500 09/11/22 1500 09/25/22 1500  10/02/22 1500 10/23/22 1500     Response to Exercise   Blood Pressure (Admit) 112/58 120/60 120/60 112/62 130/60   Blood Pressure (Exercise) 126/60 110/58 130/60 124/74 130/60   Blood Pressure (Exit) 110/60 120/68 142/62 116/50 126/60   Heart Rate (Admit) 83 bpm 78 bpm 80 bpm 87 bpm 89 bpm   Heart Rate (Exercise) 86 bpm 95 bpm 100 bpm 96 bpm 100 bpm   Heart Rate (Exit) 85 bpm 77 bpm 85 bpm 81  bpm 90 bpm   Oxygen Saturation (Admit) 94 % 95 % 94 % 92 % 89 %   Oxygen Saturation (Exercise) 92 % 95 % 93 % 90 % 92 %   Oxygen Saturation (Exit) 95 % 96 % 94 % 94 % 93 %   Rating of Perceived Exertion (Exercise) 12 12 12 12 13    Perceived Dyspnea (Exercise) 12 12 12  0 1   Duration Continue with 30 min of aerobic exercise without signs/symptoms of physical distress. Continue with 30 min of aerobic exercise without signs/symptoms of physical distress. Continue with 30 min of aerobic exercise without signs/symptoms of physical distress. Continue with 30 min of aerobic exercise without signs/symptoms of physical distress. Continue with 30 min of aerobic exercise without signs/symptoms of physical distress.   Intensity THRR unchanged THRR unchanged THRR unchanged THRR unchanged THRR unchanged     Progression   Progression Continue to progress workloads to maintain intensity without signs/symptoms of physical distress. Continue to progress workloads to maintain intensity without signs/symptoms of physical distress. Continue to progress workloads to maintain intensity without signs/symptoms of physical distress. Continue to progress workloads to maintain intensity without signs/symptoms of physical distress. Continue to progress workloads to maintain intensity without signs/symptoms of physical distress.     Resistance Training   Training Prescription Yes Yes Yes Yes Yes   Weight 2 2 3 3 4    Reps 10-15 10-15 10-15 10-15 10-15   Time 10 Minutes 10 Minutes 10 Minutes -- --     Treadmill   MPH 1.3 1.5 1.6 1.5  1.5   Grade 0 0 0 0 0   Minutes 17 17 17 15 15    METs 2 2.15 2.23 2.15 2.15     NuStep   Level 1 2 2 2 2    SPM 40 79 67 55 82   Minutes 22 22 22 15 15    METs 1.7 1.9 1.8 1.6 1.9     Oxygen   Maintain Oxygen Saturation -- -- -- 88% or higher 88% or higher            Exercise Comments:   Exercise Goals and Review:   Exercise Goals     Row Name 08/22/22 1435 09/10/22 1412 10/09/22 1540         Exercise Goals   Increase Physical Activity Yes Yes Yes     Intervention Provide advice, education, support and counseling about physical activity/exercise needs.;Develop an individualized exercise prescription for aerobic and resistive training based on initial evaluation findings, risk stratification, comorbidities and participant's personal goals. Provide advice, education, support and counseling about physical activity/exercise needs.;Develop an individualized exercise prescription for aerobic and resistive training based on initial evaluation findings, risk stratification, comorbidities and participant's personal goals. Provide advice, education, support and counseling about physical activity/exercise needs.;Develop an individualized exercise prescription for aerobic and resistive training based on initial evaluation findings, risk stratification, comorbidities and participant's personal goals.     Expected Outcomes Short Term: Attend rehab on a regular basis to increase amount of physical activity.;Long Term: Exercising regularly at least 3-5 days a week.;Long Term: Add in home exercise to make exercise part of routine and to increase amount of physical activity. Short Term: Attend rehab on a regular basis to increase amount of physical activity.;Long Term: Exercising regularly at least 3-5 days a week.;Long Term: Add in home exercise to make exercise part of routine and to increase amount of physical activity. Short Term: Attend rehab on a regular basis to increase amount  of physical  activity.;Long Term: Exercising regularly at least 3-5 days a week.;Long Term: Add in home exercise to make exercise part of routine and to increase amount of physical activity.     Increase Strength and Stamina Yes Yes Yes     Intervention Provide advice, education, support and counseling about physical activity/exercise needs.;Develop an individualized exercise prescription for aerobic and resistive training based on initial evaluation findings, risk stratification, comorbidities and participant's personal goals. Provide advice, education, support and counseling about physical activity/exercise needs.;Develop an individualized exercise prescription for aerobic and resistive training based on initial evaluation findings, risk stratification, comorbidities and participant's personal goals. Provide advice, education, support and counseling about physical activity/exercise needs.;Develop an individualized exercise prescription for aerobic and resistive training based on initial evaluation findings, risk stratification, comorbidities and participant's personal goals.     Expected Outcomes Short Term: Increase workloads from initial exercise prescription for resistance, speed, and METs.;Short Term: Perform resistance training exercises routinely during rehab and add in resistance training at home;Long Term: Improve cardiorespiratory fitness, muscular endurance and strength as measured by increased METs and functional capacity ( ) Short Term: Increase workloads from initial exercise prescription for resistance, speed, and METs.;Short Term: Perform resistance training exercises routinely during rehab and add in resistance training at home;Long Term: Improve cardiorespiratory fitness, muscular endurance and strength as measured by increased METs and functional capacity ( ) Short Term: Increase workloads from initial exercise prescription for resistance, speed, and METs.;Short Term: Perform resistance training  exercises routinely during rehab and add in resistance training at home;Long Term: Improve cardiorespiratory fitness, muscular endurance and strength as measured by increased METs and functional capacity ( )     Able to understand and use rate of perceived exertion (RPE) scale Yes Yes Yes     Intervention Provide education and explanation on how to use RPE scale Provide education and explanation on how to use RPE scale Provide education and explanation on how to use RPE scale     Expected Outcomes Short Term: Able to use RPE daily in rehab to express subjective intensity level;Long Term:  Able to use RPE to guide intensity level when exercising independently Short Term: Able to use RPE daily in rehab to express subjective intensity level;Long Term:  Able to use RPE to guide intensity level when exercising independently Short Term: Able to use RPE daily in rehab to express subjective intensity level;Long Term:  Able to use RPE to guide intensity level when exercising independently     Able to understand and use Dyspnea scale Yes Yes Yes     Intervention Provide education and explanation on how to use Dyspnea scale Provide education and explanation on how to use Dyspnea scale Provide education and explanation on how to use Dyspnea scale     Expected Outcomes Short Term: Able to use Dyspnea scale daily in rehab to express subjective sense of shortness of breath during exertion;Long Term: Able to use Dyspnea scale to guide intensity level when exercising independently Short Term: Able to use Dyspnea scale daily in rehab to express subjective sense of shortness of breath during exertion;Long Term: Able to use Dyspnea scale to guide intensity level when exercising independently Short Term: Able to use Dyspnea scale daily in rehab to express subjective sense of shortness of breath during exertion;Long Term: Able to use Dyspnea scale to guide intensity level when exercising independently     Knowledge and  understanding of Target Heart Rate Range (THRR) Yes Yes Yes     Intervention Provide education  and explanation of THRR including how the numbers were predicted and where they are located for reference Provide education and explanation of THRR including how the numbers were predicted and where they are located for reference Provide education and explanation of THRR including how the numbers were predicted and where they are located for reference     Expected Outcomes Short Term: Able to state/look up THRR;Long Term: Able to use THRR to govern intensity when exercising independently;Short Term: Able to use daily as guideline for intensity in rehab Short Term: Able to state/look up THRR;Long Term: Able to use THRR to govern intensity when exercising independently;Short Term: Able to use daily as guideline for intensity in rehab Short Term: Able to state/look up THRR;Long Term: Able to use THRR to govern intensity when exercising independently;Short Term: Able to use daily as guideline for intensity in rehab     Understanding of Exercise Prescription Yes Yes Yes     Intervention Provide education, explanation, and written materials on patient's individual exercise prescription Provide education, explanation, and written materials on patient's individual exercise prescription Provide education, explanation, and written materials on patient's individual exercise prescription     Expected Outcomes Short Term: Able to explain program exercise prescription;Long Term: Able to explain home exercise prescription to exercise independently Short Term: Able to explain program exercise prescription;Long Term: Able to explain home exercise prescription to exercise independently Short Term: Able to explain program exercise prescription;Long Term: Able to explain home exercise prescription to exercise independently              Exercise Goals Re-Evaluation :  Exercise Goals Re-Evaluation     Row Name 09/10/22 1413  10/09/22 1541 10/18/22 1501 10/24/22 1351       Exercise Goal Re-Evaluation   Exercise Goals Review Increase Physical Activity;Increase Strength and Stamina;Able to understand and use rate of perceived exertion (RPE) scale;Able to understand and use Dyspnea scale;Knowledge and understanding of Target Heart Rate Range (THRR);Understanding of Exercise Prescription Increase Physical Activity;Increase Strength and Stamina;Able to understand and use rate of perceived exertion (RPE) scale;Able to understand and use Dyspnea scale;Knowledge and understanding of Target Heart Rate Range (THRR);Understanding of Exercise Prescription Increase Physical Activity;Increase Strength and Stamina;Understanding of Exercise Prescription --    Comments Pt has completed 4 sessions of pulmonary rehab. She is very motivated to be in the program and to improve her health. She is tolerating exercise and increasing her workload. She sometimes has to use the head O2 band due to her nail polish and the meter not reading properly. She is currently exercising at 2.0 METs on the treadmill. Will continue to monitor and progress as able. Pt has completed 10 sessions of pulmonary rehab. She continues to be motivated to be in the program and is enjoying coming to class and exercising. She is increasing her workloads. She is currently exercising at 2.15 METs on the treadmill. Will continue to monitor and progress as able. Pt has complete 12 visits of PR. Pt states that she has seen improvment in her stamina and strgenth since starting the program. She is able to walk furthur without taking extra breaks due to SOB. Darriell has increased her SPM on the NuStep. She keeps her speed at 1.5 on the treadmill while RPE is an 13. WIll continue to monitor and progress as able    Expected Outcomes Through exercise at rehab and home, patient will achieve their goals. Through exercise at rehab and home, patient will achieve their goals. Short term :  go over  home exercise  long term: keep improving workload and building endurance --             Discharge Exercise Prescription (Final Exercise Prescription Changes):  Exercise Prescription Changes - 10/23/22 1500       Response to Exercise   Blood Pressure (Admit) 130/60    Blood Pressure (Exercise) 130/60    Blood Pressure (Exit) 126/60    Heart Rate (Admit) 89 bpm    Heart Rate (Exercise) 100 bpm    Heart Rate (Exit) 90 bpm    Oxygen Saturation (Admit) 89 %    Oxygen Saturation (Exercise) 92 %    Oxygen Saturation (Exit) 93 %    Rating of Perceived Exertion (Exercise) 13    Perceived Dyspnea (Exercise) 1    Duration Continue with 30 min of aerobic exercise without signs/symptoms of physical distress.    Intensity THRR unchanged      Progression   Progression Continue to progress workloads to maintain intensity without signs/symptoms of physical distress.      Resistance Training   Training Prescription Yes    Weight 4    Reps 10-15      Treadmill   MPH 1.5    Grade 0    Minutes 15    METs 2.15      NuStep   Level 2    SPM 82    Minutes 15    METs 1.9      Oxygen   Maintain Oxygen Saturation 88% or higher             Nutrition:  Target Goals: Understanding of nutrition guidelines, daily intake of sodium 1500mg , cholesterol 200mg , calories 30% from fat and 7% or less from saturated fats, daily to have 5 or more servings of fruits and vegetables.  Biometrics:  Pre Biometrics - 08/22/22 1436       Pre Biometrics   Height 5\' 4"  (1.626 m)    Weight 126 lb 15.8 oz (57.6 kg)    Waist Circumference 33 inches    Hip Circumference 37 inches    Waist to Hip Ratio 0.89 %    BMI (Calculated) 21.79    Triceps Skinfold 29 mm    % Body Fat 35.7 %    Grip Strength 21.2 kg    Flexibility 0 in    Single Leg Stand 10.43 seconds              Nutrition Therapy Plan and Nutrition Goals:  Nutrition Therapy & Goals - 10/01/22 1419       Nutrition Therapy    RD appointment deferred Yes      Personal Nutrition Goals   Comments We provide educational sessions on heart healthy nutrition with handouts.      Intervention Plan   Intervention Nutrition handout(s) given to patient.    Expected Outcomes Short Term Goal: Understand basic principles of dietary content, such as calories, fat, sodium, cholesterol and nutrients.             Nutrition Assessments:  Nutrition Assessments - 08/22/22 1336       MEDFICTS Scores   Pre Score 39            MEDIFICTS Score Key: ?70 Need to make dietary changes  40-70 Heart Healthy Diet ? 40 Therapeutic Level Cholesterol Diet   Picture Your Plate Scores: <62 Unhealthy dietary pattern with much room for improvement. 41-50 Dietary pattern unlikely to meet recommendations for good health and  room for improvement. 51-60 More healthful dietary pattern, with some room for improvement.  >60 Healthy dietary pattern, although there may be some specific behaviors that could be improved.    Nutrition Goals Re-Evaluation:  Nutrition Goals Re-Evaluation     Row Name 10/18/22 1509             Goals   Nutrition Goal Healthy Eating       Comment States that she loves to eat ice cream and eats muiltiple times a week. Eats more chicken and fish then red meat. Pts wants to maintain weight and is fine with how she eats. Eats a good amount of veggies.       Expected Outcome Short term goal:cut down on sweets to maintain wright    Long term goal : maintain healthy eat and weight                Nutrition Goals Discharge (Final Nutrition Goals Re-Evaluation):  Nutrition Goals Re-Evaluation - 10/18/22 1509       Goals   Nutrition Goal Healthy Eating    Comment States that she loves to eat ice cream and eats muiltiple times a week. Eats more chicken and fish then red meat. Pts wants to maintain weight and is fine with how she eats. Eats a good amount of veggies.    Expected Outcome Short term goal:cut  down on sweets to maintain wright    Long term goal : maintain healthy eat and weight             Psychosocial: Target Goals: Acknowledge presence or absence of significant depression and/or stress, maximize coping skills, provide positive support system. Participant is able to verbalize types and ability to use techniques and skills needed for reducing stress and depression.  Initial Review & Psychosocial Screening:  Initial Psych Review & Screening - 08/22/22 1328       Initial Review   Current issues with Current Sleep Concerns;Current Anxiety/Panic      Family Dynamics   Good Support System? Yes    Comments Pt has a good support sytem with her husband, her daughters, and her grandchildren.      Barriers   Psychosocial barriers to participate in program There are no identifiable barriers or psychosocial needs.      Screening Interventions   Interventions Encouraged to exercise;Provide feedback about the scores to participant;To provide support and resources with identified psychosocial needs    Expected Outcomes Short Term goal: Utilizing psychosocial counselor, staff and physician to assist with identification of specific Stressors or current issues interfering with healing process. Setting desired goal for each stressor or current issue identified.;Long Term Goal: Stressors or current issues are controlled or eliminated.;Short Term goal: Identification and review with participant of any Quality of Life or Depression concerns found by scoring the questionnaire.;Long Term goal: The participant improves quality of Life and PHQ9 Scores as seen by post scores and/or verbalization of changes             Quality of Life Scores:  Quality of Life - 08/22/22 1450       Quality of Life   Select Quality of Life      Quality of Life Scores   Health/Function Pre 22.53 %    Socioeconomic Pre 29.17 %    Psych/Spiritual Pre 29.64 %    Family Pre 28.8 %    GLOBAL Pre 26.09 %             Scores of  19 and below usually indicate a poorer quality of life in these areas.  A difference of  2-3 points is a clinically meaningful difference.  A difference of 2-3 points in the total score of the Quality of Life Index has been associated with significant improvement in overall quality of life, self-image, physical symptoms, and general health in studies assessing change in quality of life.   PHQ-9: Review Flowsheet       08/22/2022  Depression screen PHQ 2/9  Decreased Interest 0  Down, Depressed, Hopeless 0  PHQ - 2 Score 0  Altered sleeping 1  Tired, decreased energy 0  Change in appetite 0  Feeling bad or failure about yourself  0  Trouble concentrating 0  Moving slowly or fidgety/restless 0  Suicidal thoughts 0  PHQ-9 Score 1  Difficult doing work/chores Not difficult at all    Details           Interpretation of Total Score  Total Score Depression Severity:  1-4 = Minimal depression, 5-9 = Mild depression, 10-14 = Moderate depression, 15-19 = Moderately severe depression, 20-27 = Severe depression   Psychosocial Evaluation and Intervention:  Psychosocial Evaluation - 08/22/22 1419       Psychosocial Evaluation & Interventions   Interventions Encouraged to exercise with the program and follow exercise prescription;Stress management education;Relaxation education    Comments Pt has no identifiable psychosocial barriers to completing the PR program.  She is excited to start the program and improve her overall health.  She has a good support system in her husband, daughters, and grandchildren.  She scored a 1 on her PHQ-9 related to the fact that she has trouble falling asleep at times, but the pt does take a Xanax at night as needed to help her sleep and reduce anxiety.  Right now the pt feels like the Xanax is controlling her anxiety and insomnia.  She quit smoking in March of 2023 after smoking for 55 years.  She does have some lower back pain related to  arthritis, and she is still having "heaviness" in her legs.  She is able to manage her back pain with Tylenol and stretching, and she hopes that once she loses some weight the leg "heaviness" will subside.  Her goals for the program are to breathe better with less shortness of breath and be able to walk/shop more.  We will monitor her progress as she works toward meeting these goals.    Expected Outcomes Pt's anxiety/insomnia will continue to be managed with Xanax, and she will continue to have no idenfiable psychosocial issues.    Continue Psychosocial Services  No Follow up required             Psychosocial Re-Evaluation:  Psychosocial Re-Evaluation     Row Name 09/04/22 1155 10/01/22 1420 10/18/22 1506         Psychosocial Re-Evaluation   Current issues with Current Sleep Concerns Current Sleep Concerns Current Sleep Concerns     Comments Patient is new to the program completing 2 sessions.  She is currently is being treated via PCP for insomnia with xanax prn.  Patient was referred to PR by Dr. 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  Her initial PHQ-9 score is 1 and her QOL score is 26.09%.  We will continue to monitor  her progress as she works toward meeting these goals. Patient has completed 9 sessions. Her insomina is managed with Xanax prn. He continues to have no psychosocial  barriers identified. She enjoys the program and demonstrates an interest in improving her health. We will continue to monitor her progress. Pt has seen an improvemnt in her sleep patterns, being able to have a comforable sleep night. Has been able to decrease medication usage to sleep.     Expected Outcomes Patient will have no psychosocial barriers identified at discharge. Patient will have no psychosocial barriers identified at discharge. Short term Goal: continue to improve sleep and decrease medication useage Long term Goals; Continue to have no barries identified     Interventions  Relaxation education;Encouraged to attend Pulmonary Rehabilitation for the exercise;Stress management education Relaxation education;Encouraged to attend Pulmonary Rehabilitation for the exercise;Stress management education Encouraged to attend Pulmonary Rehabilitation for the exercise;Relaxation education;Stress management education     Continue Psychosocial Services  No Follow up required No Follow up required Follow up required by staff              Psychosocial Discharge (Final Psychosocial Re-Evaluation):  Psychosocial Re-Evaluation - 10/18/22 1506       Psychosocial Re-Evaluation   Current issues with Current Sleep Concerns    Comments Pt has seen an improvemnt in her sleep patterns, being able to have a comforable sleep night. Has been able to decrease medication usage to sleep.    Expected Outcomes Short term Goal: continue to improve sleep and decrease medication useage Long term Goals; Continue to have no barries identified    Interventions Encouraged to attend Pulmonary Rehabilitation for the exercise;Relaxation education;Stress management education    Continue Psychosocial Services  Follow up required by staff              Education: Education Goals: Education classes will be provided on a weekly basis, covering required topics. Participant will state understanding/return demonstration of topics presented.  Learning Barriers/Preferences:  Learning Barriers/Preferences - 08/22/22 1336       Learning Barriers/Preferences   Learning Barriers None    Learning Preferences Written Material             Education Topics: How Lungs Work and Diseases: - Discuss the anatomy of the lungs and diseases that can affect the lungs, such as COPD.   Exercise: -Discuss the importance of exercise, FITT principles of exercise, normal and abnormal responses to exercise, and how to exercise safely.   Environmental Irritants: -Discuss types of environmental irritants and how  to limit exposure to environmental irritants.   Meds/Inhalers and oxygen: - Discuss respiratory medications, definition of an inhaler and oxygen, and the proper way to use an inhaler and oxygen.   Energy Saving Techniques: - Discuss methods to conserve energy and decrease shortness of breath when performing activities of daily living.  Flowsheet Row PULMONARY REHAB OTHER RESPIRATORY from 11/01/2022 in [facility]  Date 10/18/22  Educator DM  Instruction Review Code 1- Verbalizes Understanding       Bronchial Hygiene / Breathing Techniques: - Discuss breathing mechanics, pursed-lip breathing technique,  proper posture, effective ways to clear airways, and other functional breathing techniques Flowsheet Row PULMONARY REHAB OTHER RESPIRATORY from 11/01/2022 in [facility]  Date 10/25/22  Educator HB       Cleaning Equipment: - Provides group verbal and written instruction about the health risks of elevated stress, cause of high stress, and healthy ways to reduce stress. Flowsheet Row PULMONARY REHAB OTHER RESPIRATORY from 11/01/2022 in [facility]  Date 11/01/22  Educator [facility]  Instruction Review Code 1- Verbalizes Understanding  Nutrition I: Fats: - Discuss the types of cholesterol, what cholesterol does to the body, and how cholesterol levels can be controlled.   Nutrition II: Labels: -Discuss the different components of food labels and how to read food labels.   Respiratory Infections: - Discuss the signs and symptoms of respiratory infections, ways to prevent respiratory infections, and the importance of seeking medical treatment when having a respiratory infection. Flowsheet Row PULMONARY REHAB OTHER RESPIRATORY from 11/01/2022 in [facility]  Date 08/30/22  Educator Handout       Stress I: Signs and Symptoms: - Discuss the causes of stress, how stress may lead to anxiety and  depression, and ways to limit stress. Flowsheet Row PULMONARY REHAB OTHER RESPIRATORY from 11/01/2022 in [facility]  Date 09/06/22  Educator handout       Stress II: Relaxation: -Discuss relaxation techniques to limit stress. Flowsheet Row PULMONARY REHAB OTHER RESPIRATORY from 11/01/2022 in [facility]  Date 09/13/22  Educator handout       Oxygen for Home/Travel: - Discuss how to prepare for travel when on oxygen and proper ways to transport and store oxygen to ensure safety. Flowsheet Row PULMONARY REHAB OTHER RESPIRATORY from 11/01/2022 in [facility]  Date 09/20/22  Educator handout       Knowledge Questionnaire Score:  Knowledge Questionnaire Score - 08/22/22 1337       Knowledge Questionnaire Score   Pre Score 12/18             Core Components/Risk Factors/Patient Goals at Admission:  Personal Goals and Risk Factors at Admission - 08/22/22 1342       Core Components/Risk Factors/Patient Goals on Admission    Weight Management Weight Loss    Improve shortness of breath with ADL's Yes    Intervention Provide education, individualized exercise plan and daily activity instruction to help decrease symptoms of SOB with activities of daily living.    Expected Outcomes Short Term: Improve cardiorespiratory fitness to achieve a reduction of symptoms when performing ADLs;Long Term: Be able to perform more ADLs without symptoms or delay the onset of symptoms    Increase knowledge of respiratory medications and ability to use respiratory devices properly  Yes    Intervention Provide education and demonstration as needed of appropriate use of medications, inhalers, and oxygen therapy.    Expected Outcomes Short Term: Achieves understanding of medications use. Understands that oxygen is a medication prescribed by physician. Demonstrates appropriate use of inhaler and oxygen therapy.;Long Term: Maintain appropriate  use of medications, inhalers, and oxygen therapy.    Hypertension Yes    Intervention Provide education on lifestyle modifcations including regular physical activity/exercise, weight management, moderate sodium restriction and increased consumption of fresh fruit, vegetables, and low fat dairy, alcohol moderation, and smoking cessation.    Expected Outcomes Short Term: Continued assessment and intervention until BP is < 140/7mm HG in hypertensive participants. < 130/74mm HG in hypertensive participants with diabetes, heart failure or chronic kidney disease.;Long Term: Maintenance of blood pressure at goal levels.    Personal Goal Other Yes    Personal Goal Pt wants to breathe better with less shortness of breath and be able to walk more and shop.    Intervention Pt will attend PR twice a week and start a home exercise program.    Expected Outcomes Pt will attend PR twice a week and complete the program meeting both her personal and program goals.  Core Components/Risk Factors/Patient Goals Review:   Goals and Risk Factor Review     Row Name 09/04/22 1206 10/01/22 1423 10/18/22 1515         Core Components/Risk Factors/Patient Goals Review   Personal Goals Review Improve shortness of breath with ADL's;Develop more efficient breathing techniques such as purse lipped breathing and diaphragmatic breathing and practicing self-pacing with activity. Improve shortness of breath with ADL's;Develop more efficient breathing techniques such as purse lipped breathing and diaphragmatic breathing and practicing self-pacing with activity. Improve shortness of breath with ADL's;Develop more efficient breathing techniques such as purse lipped breathing and diaphragmatic breathing and practicing self-pacing with activity.     Review Patient is new to the PR program attending 2 sessions.  She seems to enjoy class and interactive with class and staff.  Patient was referred to PR by Dr. Sherene Sires with  chronic resp failure with hypoxia and COPD mixed type.  Her personal goals for the program are to have less SOB , breath better and be abe to walk more while shopping.  She tolerates exercise well while on Treadmill and nu-step with O2 sats maintaining at 92% without any oxygen supplemental needed at this time.  We will continue to monitor as she works toward meeting these goals as she progresses in the program. Patient had completed 9 sessions. She is doing well in the program with consistent attendance. Her current weight is 57.3 KG down 0.9 KG from last 30 day review. She tolerates exercise well while on Treadmill and nu-step with O2 sats maintaining at 93% without any oxygen supplemental needed at this time. Her personal goals for the program are to have less SOB , breath better and be abe to walk more while shopping. We will continue to monitor as she works toward meeting these goals as she progresses in the program. Pt has been able to improve her SOB with her ADL's. Was able to shop with family and not have to take muilple breaks. Have not needed to use pursed lip breathing tech, has not pushed herself to the limit to need inhaler.     Expected Outcomes Patient will complete the program meeting both program and personal goals. Patient will complete the program meeting both program and personal goals. short term goal: going over pursed lip breathing long term goals: continue to improve SOB with ADL's              Core Components/Risk Factors/Patient Goals at Discharge (Final Review):   Goals and Risk Factor Review - 10/18/22 1515       Core Components/Risk Factors/Patient Goals Review   Personal Goals Review Improve shortness of breath with ADL's;Develop more efficient breathing techniques such as purse lipped breathing and diaphragmatic breathing and practicing self-pacing with activity.    Review Pt has been able to improve her SOB with her ADL's. Was able to shop with family and not have to  take muilple breaks. Have not needed to use pursed lip breathing tech, has not pushed herself to the limit to need inhaler.    Expected Outcomes short term goal: going over pursed lip breathing long term goals: continue to improve SOB with ADL's             ITP Comments:  ITP Comments     Row Name 10/10/22 1613 11/07/22 1519         ITP Comments 30 day review completed. ITP sent to Dr.Jehanzeb Memon, Medical Director of  Pulmonary Rehab. Continue with ITP unless  changes are made by physician. 30 day review completed. ITP sent to Dr.Jehanzeb Memon, Medical Director of  Pulmonary Rehab. Continue with ITP unless changes are made by physician.               Comments: 30 day review

## 2022-11-08 ENCOUNTER — Encounter (HOSPITAL_COMMUNITY): Payer: Medicare Other

## 2022-11-13 ENCOUNTER — Ambulatory Visit (HOSPITAL_COMMUNITY): Payer: Medicare Other

## 2022-11-13 ENCOUNTER — Encounter (HOSPITAL_COMMUNITY): Payer: Medicare Other

## 2022-11-15 ENCOUNTER — Encounter (HOSPITAL_COMMUNITY): Payer: Medicare Other

## 2022-11-15 ENCOUNTER — Encounter (HOSPITAL_COMMUNITY)
Admission: RE | Admit: 2022-11-15 | Discharge: 2022-11-15 | Disposition: A | Discharge status: Home / Self Care | Payer: Medicare Other | Source: Ambulatory Visit | Attending: Family Medicine | Admitting: Family Medicine

## 2022-11-15 DIAGNOSIS — J449 Chronic obstructive pulmonary disease, unspecified: Secondary | ICD-10-CM

## 2022-11-15 DIAGNOSIS — J9611 Chronic respiratory failure with hypoxia: Secondary | ICD-10-CM | POA: Diagnosis not present

## 2022-11-15 NOTE — Progress Notes (Signed)
Daily Session Note  Patient Details  Name: Desiree Barber MRN: [id number] Date of Birth: 1950/09/30 Referring Provider:   Flowsheet Row PULMONARY REHAB OTHER RESP ORIENTATION from 08/22/2022 in [facility]  Referring Provider Dr. Sherene Sires       Encounter Date: 11/15/2022  Check In:  Session Check In - 11/15/22 1425       Check-In   Supervising physician immediately available to respond to emergencies See telemetry face sheet for immediately available ER MD    Location [facility]    Staff Present Rodena Medin, RN, BSN;Heather Gaynell Face, Exercise Physiologist;Hillary Leonidas Romberg BSN, RN    Virtual Visit No    Medication changes reported     No    Fall or balance concerns reported    No    Warm-up and Cool-down Performed on first and last piece of equipment    Resistance Training Performed Yes    VAD Patient? No    PAD/SET Patient? No      Pain Assessment   Currently in Pain? No/denies    Pain Score 0-No pain    Multiple Pain Sites No             Capillary Blood Glucose: No results found for this or any previous visit (from the past 24 hour(s)).    Social History   Tobacco Use  Smoking Status Former   Current packs/day: 0.00   Average packs/day: 0.5 packs/day for 55.0 years (27.5 ttl pk-yrs)   Types: Cigarettes   Start date: 05/30/1966   Quit date: 05/29/2021   Years since quitting: 1.4   Passive exposure: Past  Smokeless Tobacco Former  Tobacco Comments   Quit date 05/29/2021    Goals Met:  Proper associated with RPD/PD & O2 Sat Independence with exercise equipment Using PLB without cueing & demonstrates good technique Exercise tolerated well No report of concerns or symptoms today Strength training completed today  Goals Unmet:  Not Applicable  Comments: Pt able to follow exercise prescription today without complaint.  Will continue to monitor for progression.    Dr. Erick Blinks is Medical Director for [facility] Pulmonary Rehab.

## 2022-11-20 ENCOUNTER — Encounter (HOSPITAL_COMMUNITY): Payer: Medicare Other

## 2022-11-20 ENCOUNTER — Encounter (HOSPITAL_COMMUNITY): Admission: RE | Admit: 2022-11-20 | Payer: Medicare Other | Source: Ambulatory Visit

## 2022-11-20 DIAGNOSIS — J9611 Chronic respiratory failure with hypoxia: Secondary | ICD-10-CM

## 2022-11-20 DIAGNOSIS — J449 Chronic obstructive pulmonary disease, unspecified: Secondary | ICD-10-CM

## 2022-11-20 NOTE — Progress Notes (Signed)
Daily Session Note  Patient Details  Name: Desiree Barber MRN: [id number] Date of Birth: 1950/10/01 Referring Provider:   Flowsheet Row PULMONARY REHAB OTHER RESP ORIENTATION from 08/22/2022 in [facility]  Referring Provider Dr. Sherene Sires       Encounter Date: 11/20/2022  Check In:  Session Check In - 11/20/22 1453       Check-In   Supervising physician immediately available to respond to emergencies See telemetry face sheet for immediately available MD    Location [facility]    Staff Present Ross Ludwig, BS, Exercise Physiologist;Jessica Juanetta Gosling, MA, RCEP, CCRP, Harolyn Rutherford, RN, BSN    Virtual Visit No    Medication changes reported     No    Fall or balance concerns reported    No    Tobacco Cessation No Change    Warm-up and Cool-down Performed on first and last piece of equipment    Resistance Training Performed Yes    VAD Patient? No      Pain Assessment   Currently in Pain? No/denies    Pain Score 0-No pain             Capillary Blood Glucose: No results found for this or any previous visit (from the past 24 hour(s)).    Social History   Tobacco Use  Smoking Status Former   Current packs/day: 0.00   Average packs/day: 0.5 packs/day for 55.0 years (27.5 ttl pk-yrs)   Types: Cigarettes   Start date: 05/30/1966   Quit date: 05/29/2021   Years since quitting: 1.4   Passive exposure: Past  Smokeless Tobacco Former  Tobacco Comments   Quit date 05/29/2021    Goals Met:  Proper associated with RPD/PD & O2 Sat Independence with exercise equipment Using PLB without cueing & demonstrates good technique Exercise tolerated well Queuing for purse lip breathing No report of concerns or symptoms today Strength training completed today  Goals Unmet:  Not Applicable  Comments: Pt able to follow exercise prescription today without complaint.  Will continue to monitor for progression.    Dr. Erick Blinks is  Medical Director for [facility] Pulmonary Rehab.

## 2022-11-22 ENCOUNTER — Encounter (HOSPITAL_COMMUNITY): Payer: Medicare Other

## 2022-11-22 ENCOUNTER — Encounter (HOSPITAL_COMMUNITY)
Admission: RE | Admit: 2022-11-22 | Discharge: 2022-11-22 | Disposition: A | Discharge status: Home / Self Care | Payer: Medicare Other | Source: Ambulatory Visit | Attending: Family Medicine | Admitting: Family Medicine

## 2022-11-22 ENCOUNTER — Encounter: Payer: Self-pay | Admitting: Internal Medicine

## 2022-11-22 DIAGNOSIS — J9611 Chronic respiratory failure with hypoxia: Secondary | ICD-10-CM | POA: Diagnosis not present

## 2022-11-22 DIAGNOSIS — J449 Chronic obstructive pulmonary disease, unspecified: Secondary | ICD-10-CM | POA: Diagnosis not present

## 2022-11-22 NOTE — Progress Notes (Signed)
Daily Session Note  Patient Details  Name: Desiree Barber MRN: [id number] Date of Birth: [birth year] Referring Provider:   Flowsheet Row PULMONARY REHAB OTHER RESP ORIENTATION from 08/22/2022 in [facility] CARDIAC REHABILITATION  Referring Provider Dr. Sherene Sires       Encounter Date: 11/22/2022  Check In:  Session Check In - 11/22/22 1415       Check-In   Supervising physician immediately available to respond to emergencies See telemetry face sheet for immediately available MD    Location [facility]    Staff Present Fabio Pierce, MA, RCEP, CCRP, CCET;Hillary Fisher BSN, RN;Tammey Deeg Bancroft, RN, Neal Dy, RN, BSN    Virtual Visit No    Medication changes reported     No    Fall or balance concerns reported    No    Tobacco Cessation No Change    Warm-up and Cool-down Performed on first and last piece of equipment    Resistance Training Performed Yes    VAD Patient? No    PAD/SET Patient? No      Pain Assessment   Currently in Pain? No/denies    Pain Score 0-No pain             Capillary Blood Glucose: No results found for this or any previous visit (from the past 24 hour(s)).    Social History   Tobacco Use  Smoking Status Former   Current packs/day: 0.00   Average packs/day: 0.5 packs/day for 55.0 years (27.5 ttl pk-yrs)   Types: Cigarettes   Start date: 05/30/1966   Quit date: 05/29/2021   Years since quitting: 1.4   Passive exposure: Past  Smokeless Tobacco Former  Tobacco Comments   Quit date 05/29/2021    Goals Met:  Proper associated with RPD/PD & O2 Sat Independence with exercise equipment Using PLB without cueing & demonstrates good technique Exercise tolerated well Queuing for purse lip breathing No report of concerns or symptoms today Strength training completed today  Goals Unmet:  Not Applicable  Comments: Pt able to follow exercise prescription today without complaint.  Will continue to monitor for  progression.    Dr. Erick Blinks is Medical Director for [facility] Pulmonary Rehab.

## 2022-11-27 ENCOUNTER — Encounter (HOSPITAL_COMMUNITY): Payer: Medicare Other

## 2022-11-27 ENCOUNTER — Encounter (HOSPITAL_COMMUNITY)
Admission: RE | Admit: 2022-11-27 | Discharge: 2022-11-27 | Disposition: A | Discharge status: Home / Self Care | Payer: Medicare Other | Source: Ambulatory Visit | Attending: Family Medicine | Admitting: Family Medicine

## 2022-11-27 DIAGNOSIS — J9611 Chronic respiratory failure with hypoxia: Secondary | ICD-10-CM | POA: Diagnosis not present

## 2022-11-27 DIAGNOSIS — J449 Chronic obstructive pulmonary disease, unspecified: Secondary | ICD-10-CM | POA: Diagnosis not present

## 2022-11-27 NOTE — Progress Notes (Signed)
Daily Session Note  Patient Details  Name: Desiree Barber MRN: [id number] Date of Birth: Sep 23, 1950 Referring Provider:   Flowsheet Row PULMONARY REHAB OTHER RESP ORIENTATION from 08/22/2022 in [facility] CARDIAC REHABILITATION  Referring Provider Dr. Sherene Sires       Encounter Date: 11/27/2022  Check In:   Capillary Blood Glucose: No results found for this or any previous visit (from the past 24 hour(s)).    Social History   Tobacco Use  Smoking Status Former   Current packs/day: 0.00   Average packs/day: 0.5 packs/day for 55.0 years (27.5 ttl pk-yrs)   Types: Cigarettes   Start date: 05/30/1966   Quit date: 05/29/2021   Years since quitting: 1.4   Passive exposure: Past  Smokeless Tobacco Former  Tobacco Comments   Quit date 05/29/2021    Goals Met:  Independence with exercise equipment Exercise tolerated well No report of concerns or symptoms today Strength training completed today  Goals Unmet:  Not Applicable  Comments: Pt able to follow exercise prescription today without complaint.  Will continue to monitor for progression.    Dr. Erick Blinks is Medical Director for [facility] Pulmonary Rehab.

## 2022-11-29 ENCOUNTER — Encounter (HOSPITAL_COMMUNITY)
Admission: RE | Admit: 2022-11-29 | Discharge: 2022-11-29 | Disposition: A | Discharge status: Home / Self Care | Payer: Medicare Other | Source: Ambulatory Visit | Attending: Family Medicine | Admitting: Family Medicine

## 2022-11-29 ENCOUNTER — Encounter (HOSPITAL_COMMUNITY): Payer: Medicare Other

## 2022-11-29 DIAGNOSIS — J449 Chronic obstructive pulmonary disease, unspecified: Secondary | ICD-10-CM

## 2022-11-29 DIAGNOSIS — J9611 Chronic respiratory failure with hypoxia: Secondary | ICD-10-CM

## 2022-11-29 NOTE — Progress Notes (Signed)
Daily Session Note  Patient Details  Name: Desiree Barber MRN: [id number] Date of Birth: 1950/11/15 Referring Provider:   Flowsheet Row PULMONARY REHAB OTHER RESP ORIENTATION from 08/22/2022 in [facility] CARDIAC REHABILITATION  Referring Provider Dr. Sherene Sires       Encounter Date: 11/29/2022  Check In:  Session Check In - 11/29/22 1529       Check-In   Supervising physician immediately available to respond to emergencies See telemetry face sheet for immediately available ER MD    Location [facility]    Staff Present Fabio Pierce, MA, RCEP, CCRP, Dow Adolph, RN, Pleas Koch, RN, BSN    Virtual Visit No    Medication changes reported     No    Fall or balance concerns reported    No    Warm-up and Cool-down Performed on first and last piece of equipment    Resistance Training Performed Yes    VAD Patient? No    PAD/SET Patient? No      Pain Assessment   Currently in Pain? No/denies    Pain Score 0-No pain    Multiple Pain Sites No             Capillary Blood Glucose: No results found for this or any previous visit (from the past 24 hour(s)).    Social History   Tobacco Use  Smoking Status Former   Current packs/day: 0.00   Average packs/day: 0.5 packs/day for 55.0 years (27.5 ttl pk-yrs)   Types: Cigarettes   Start date: 05/30/1966   Quit date: 05/29/2021   Years since quitting: 1.5   Passive exposure: Past  Smokeless Tobacco Former  Tobacco Comments   Quit date 05/29/2021    Goals Met:  Proper associated with RPD/PD & O2 Sat Independence with exercise equipment Using PLB without cueing & demonstrates good technique Exercise tolerated well No report of concerns or symptoms today Strength training completed today  Goals Unmet:  Not Applicable  Comments: Pt able to follow exercise prescription today without complaint.  Will continue to monitor for progression.    Dr. Erick Blinks is Medical Director for [facility]  Pulmonary Rehab.

## 2022-12-04 ENCOUNTER — Encounter (HOSPITAL_COMMUNITY)
Admission: RE | Admit: 2022-12-04 | Discharge: 2022-12-04 | Disposition: A | Discharge status: Home / Self Care | Payer: Medicare Other | Source: Ambulatory Visit | Attending: Family Medicine

## 2022-12-04 ENCOUNTER — Encounter (HOSPITAL_COMMUNITY): Payer: Medicare Other

## 2022-12-04 DIAGNOSIS — J449 Chronic obstructive pulmonary disease, unspecified: Secondary | ICD-10-CM

## 2022-12-04 DIAGNOSIS — J9611 Chronic respiratory failure with hypoxia: Secondary | ICD-10-CM | POA: Diagnosis not present

## 2022-12-04 NOTE — Progress Notes (Signed)
Daily Session Note  Patient Details  Name: Desiree Barber MRN: [id number] Date of Birth: Jan 03, 1951 Referring Provider:   Flowsheet Row PULMONARY REHAB OTHER RESP ORIENTATION from 08/22/2022 in [facility] CARDIAC REHABILITATION  Referring Provider Dr. Sherene Sires       Encounter Date: 12/04/2022  Check In:  Session Check In - 12/04/22 1449       Check-In   Supervising physician immediately available to respond to emergencies See telemetry face sheet for immediately available MD    Location [facility]    Staff Present Ross Ludwig, BS, Exercise Physiologist;Zolton Dowson Daphine Deutscher, RN, BSN;Jessica Hawkins, MA, RCEP, CCRP, CCET    Virtual Visit No    Medication changes reported     No    Fall or balance concerns reported    No    Tobacco Cessation No Change    Warm-up and Cool-down Performed on first and last piece of equipment    Resistance Training Performed Yes    VAD Patient? No      Pain Assessment   Currently in Pain? No/denies             Capillary Blood Glucose: No results found for this or any previous visit (from the past 24 hour(s)).    Social History   Tobacco Use  Smoking Status Former   Current packs/day: 0.00   Average packs/day: 0.5 packs/day for 55.0 years (27.5 ttl pk-yrs)   Types: Cigarettes   Start date: 05/30/1966   Quit date: 05/29/2021   Years since quitting: 1.5   Passive exposure: Past  Smokeless Tobacco Former  Tobacco Comments   Quit date 05/29/2021    Goals Met:  Proper associated with RPD/PD & O2 Sat Independence with exercise equipment Using PLB without cueing & demonstrates good technique Exercise tolerated well Queuing for purse lip breathing No report of concerns or symptoms today Strength training completed today  Goals Unmet:  Not Applicable  Comments: Pt able to follow exercise prescription today without complaint.  Will continue to monitor for progression.    Dr. Erick Blinks is Medical Director for [facility] Pulmonary Rehab.

## 2022-12-05 ENCOUNTER — Encounter (HOSPITAL_COMMUNITY): Payer: Self-pay | Admitting: *Deleted

## 2022-12-05 DIAGNOSIS — J449 Chronic obstructive pulmonary disease, unspecified: Secondary | ICD-10-CM

## 2022-12-05 DIAGNOSIS — J9611 Chronic respiratory failure with hypoxia: Secondary | ICD-10-CM

## 2022-12-05 NOTE — Progress Notes (Signed)
Pulmonary Individual Treatment Plan  Patient Details  Name: Desiree Barber MRN: [id number] Date of Birth: 01-22-1951 Referring Provider:   Flowsheet Row PULMONARY REHAB OTHER RESP ORIENTATION from 08/22/2022 in [facility] CARDIAC REHABILITATION  Referring Provider Dr. Sherene Sires       Initial Encounter Date:  Flowsheet Row PULMONARY REHAB OTHER RESP ORIENTATION from 08/22/2022 in [facility]  Date 08/22/22       Visit Diagnosis: Chronic respiratory failure with hypoxia (HCC)  COPD mixed type (HCC)  Patient's Home Medications on Admission:   Current Outpatient Medications:    albuterol (PROVENTIL) (2.5 MG/3ML) 0.083% nebulizer solution, Up to every 4 hours as needed, Disp: 75 mL, Rfl: 12   albuterol (VENTOLIN HFA) 108 (90 Base) MCG/ACT inhaler, Inhale into the lungs. (Patient not taking: Reported on 08/22/2022), Disp: , Rfl:    ALPRAZolam (XANAX) 0.25 MG tablet, Take 0.25 mg by mouth daily as needed for anxiety or sleep., Disp: , Rfl:    Ascorbic Acid (VITAMIN C) 100 MG tablet, Take 100 mg by mouth daily., Disp: , Rfl:    clopidogrel (PLAVIX) 75 MG tablet, Take 1 tablet (75 mg total) by mouth daily., Disp: 30 tablet, Rfl: 11   losartan (COZAAR) 25 MG tablet, Take 1 tablet (25 mg total) by mouth daily., Disp: 90 tablet, Rfl: 2   pantoprazole (PROTONIX) 40 MG tablet, Take 1 tablet (40 mg total) by mouth 2 (two) times daily., Disp: 60 tablet, Rfl: 11   TRELEGY ELLIPTA 100-62.5-25 MCG/ACT AEPB, Inhale 1 puff into the lungs daily., Disp: , Rfl:    umeclidinium-vilanterol (ANORO ELLIPTA) 62.5-25 MCG/ACT AEPB, Inhale 1 puff into the lungs daily., Disp: 1 each, Rfl: 11   vitamin B-12 (CYANOCOBALAMIN) 100 MCG tablet, Take 100 mcg by mouth daily., Disp: , Rfl:    VITAMIN D, CHOLECALCIFEROL, PO, Take 1 tablet by mouth daily. One daily, Disp: , Rfl:   Past Medical History: Past Medical History:  Diagnosis Date   Anemia    Anxiety    Occasional   Arthritis    COPD  (chronic obstructive pulmonary disease) (HCC)    HTN (hypertension)    Iron deficiency anemia due to chronic blood loss 03/28/2022    Tobacco Use: Social History   Tobacco Use  Smoking Status Former   Current packs/day: 0.00   Average packs/day: 0.5 packs/day for 55.0 years (27.5 ttl pk-yrs)   Types: Cigarettes   Start date: 05/30/1966   Quit date: 05/29/2021   Years since quitting: 1.5   Passive exposure: Past  Smokeless Tobacco Former  Tobacco Comments   Quit date 05/29/2021    Labs: Review Flowsheet       Latest Ref Rng & Units 03/01/2022 04/10/2022  Labs for ITP Cardiac and Pulmonary Rehab  Cholestrol 0 - 200 mg/dL 478  -  LDL (calc) 0 - 99 mg/dL 65  -  HDL-C >29 mg/dL 77  -  Trlycerides <562 mg/dL 38  -  TCO2 22 - 32 mmol/L - 28     Details            Capillary Blood Glucose: No results found for: "GLUCAP"   Pulmonary Assessment Scores:  Pulmonary Assessment Scores     Row Name 08/22/22 1314         ADL UCSD   SOB Score total 34     Rest 0     Walk 3     Stairs 4     Bath 0     Dress  0     Shop 2       CAT Score   CAT Score 15       mMRC Score   mMRC Score 1             UCSD: Self-administered rating of dyspnea associated with activities of daily living (ADLs) 6-point scale (0 = "not at all" to 5 = "maximal or unable to do because of breathlessness")  Scoring Scores range from 0 to 120.  Minimally important difference is 5 units  CAT: CAT can identify the health impairment of COPD patients and is better correlated with disease progression.  CAT has a scoring range of zero to 40. The CAT score is classified into four groups of low (less than 10), medium (10 - 20), high (21-30) and very high (31-40) based on the impact level of disease on health status. A CAT score over 10 suggests significant symptoms.  A worsening CAT score could be explained by an exacerbation, poor medication adherence, poor inhaler technique, or progression of COPD or  comorbid conditions.  CAT MCID is 2 points  mMRC: mMRC (Modified Medical Research Council) Dyspnea Scale is used to assess the degree of baseline functional disability in patients of respiratory disease due to dyspnea. No minimal important difference is established. A decrease in score of 1 point or greater is considered a positive change.   Pulmonary Function Assessment:   Exercise Target Goals: Exercise Program Goal: Individual exercise prescription set using results from initial 6 min walk test and THRR while considering  patient's activity barriers and safety.   Exercise Prescription Goal: Initial exercise prescription builds to 30-45 minutes a day of aerobic activity, 2-3 days per week.  Home exercise guidelines will be given to patient during program as part of exercise prescription that the participant will acknowledge.  Activity Barriers & Risk Stratification:  Activity Barriers & Cardiac Risk Stratification - 08/22/22 1322       Activity Barriers & Cardiac Risk Stratification   Activity Barriers Arthritis;Back Problems;Neck/Spine Problems;Joint Problems;Deconditioning;Muscular Weakness;Shortness of Breath    Cardiac Risk Stratification High             6 Minute Walk:  6 Minute Walk     Row Name 08/22/22 1432         6 Minute Walk   Phase Initial     Distance 800 feet     Walk Time 6 minutes     # of Rest Breaks 1     MPH 1.5     METS 2.38     RPE 13     Perceived Dyspnea  13     VO2 Peak 8.33     Symptoms Yes (comment)     Comments pt needed one sittting break due to SOB     Resting HR 81 bpm     Resting BP 134/60     Resting Oxygen Saturation  92 %     Exercise Oxygen Saturation  during 6 min walk 80 %     Max Ex. HR 109 bpm     Max Ex. BP 140/68     2 Minute Post BP 136/60       Interval HR   1 Minute HR 93     2 Minute HR 104     3 Minute HR 103     4 Minute HR 109     5 Minute HR 103     6 Minute HR 105  2 Minute Post HR 91      Interval Heart Rate? Yes       Interval Oxygen   Interval Oxygen? Yes     Baseline Oxygen Saturation % 92 %     1 Minute Oxygen Saturation % 90 %     1 Minute Liters of Oxygen 0 L     2 Minute Oxygen Saturation % 85 %     2 Minute Liters of Oxygen 0 L     3 Minute Oxygen Saturation % 88 %     3 Minute Liters of Oxygen 0 L     4 Minute Oxygen Saturation % 83 %     5 Minute Oxygen Saturation % 81 %     6 Minute Oxygen Saturation % 80 %     6 Minute Liters of Oxygen 0 L     2 Minute Post Oxygen Saturation % 92 %     2 Minute Post Liters of Oxygen 0 L              Oxygen Initial Assessment:  Oxygen Initial Assessment - 08/22/22 1437       Initial 6 min Walk   Oxygen Used None      Program Oxygen Prescription   Program Oxygen Prescription None   pt did not have accuret reading due to nail polish, will be using forhead sensor if nail polish isnt removed            Oxygen Re-Evaluation:  Oxygen Re-Evaluation     Row Name 10/09/22 1544 10/18/22 1522 10/26/22 1239 11/22/22 1510       Program Oxygen Prescription   Program Oxygen Prescription None None -- None      Home Oxygen   Home Oxygen Device Portable Concentrator Portable Concentrator -- Portable Concentrator    Sleep Oxygen Prescription None None -- None    Home Exercise Oxygen Prescription Pulsed Pulsed -- None    Liters per minute 2 2 -- --    Home Resting Oxygen Prescription None None -- Pulsed  Pt wears her 02 15-20 min a day when she is on her computer    Liters per minute -- -- -- 2    Compliance with Home Oxygen Use -- Yes -- Yes      Goals/Expected Outcomes   Short Term Goals To learn and exhibit compliance with exercise, home and travel O2 prescription;To learn and understand importance of monitoring SPO2 with pulse oximeter and demonstrate accurate use of the pulse oximeter.;To learn and understand importance of maintaining oxygen saturations>88%;To learn and demonstrate proper pursed lip breathing  techniques or other breathing techniques. ;To learn and demonstrate proper use of respiratory medications To learn and exhibit compliance with exercise, home and travel O2 prescription;To learn and understand importance of monitoring SPO2 with pulse oximeter and demonstrate accurate use of the pulse oximeter.;To learn and understand importance of maintaining oxygen saturations>88%;To learn and demonstrate proper pursed lip breathing techniques or other breathing techniques. ;To learn and demonstrate proper use of respiratory medications To learn and demonstrate proper pursed lip breathing techniques or other breathing techniques.  To learn and exhibit compliance with exercise, home and travel O2 prescription;To learn and understand importance of monitoring SPO2 with pulse oximeter and demonstrate accurate use of the pulse oximeter.;To learn and understand importance of maintaining oxygen saturations>88%;To learn and demonstrate proper pursed lip breathing techniques or other breathing techniques. ;To learn and demonstrate proper use of respiratory medications    Long  Term Goals Compliance with respiratory medication;Exhibits proper breathing techniques, such as pursed lip breathing or other method taught during program session;Maintenance of O2 saturations>88%;Verbalizes importance of monitoring SPO2 with pulse oximeter and return demonstration;Exhibits compliance with exercise, home  and travel O2 prescription Compliance with respiratory medication;Exhibits proper breathing techniques, such as pursed lip breathing or other method taught during program session;Maintenance of O2 saturations>88%;Verbalizes importance of monitoring SPO2 with pulse oximeter and return demonstration;Exhibits compliance with exercise, home  and travel O2 prescription Exhibits proper breathing techniques, such as pursed lip breathing or other method taught during program session Exhibits compliance with exercise, home  and travel O2  prescription;Verbalizes importance of monitoring SPO2 with pulse oximeter and return demonstration;Maintenance of O2 saturations>88%;Exhibits proper breathing techniques, such as pursed lip breathing or other method taught during program session;Compliance with respiratory medication;Demonstrates proper use of MDI's    Comments -- Micah Flesher to Dr to make sure conentrator was good and aproval to take on airplane for trip to [place]. Asked her about her pursed lip breathing when needed ans she stated that she didnt use it but also sounded that she did not fully understand. Her O2 states are maintaning above 88% and knows how to take her SpO2 with portable pulse ox. Diaphragmatic and PLB breathing explained and performed with patient. Patient has a better understanding of how to do these exercises to help with breathing performance and relaxation. Patient performed breathing techniques adequately and to practice further at home. Pt does not wear 02 during exercise.  She states that her MD told her to wear 2L 02 at home while she is resting for 15-20 min a day.  She has a pulse ox at home and understands that her oxygen level needs to be above 88%.  She has an albuterol inhaler as needed but has not had to use it.    Goals/Expected Outcomes -- short term goal: reminders about PLB   Long term goal: continue to monitor stats at home and on trip. Short: practice PLB and diaphragmatic breathing at home. Long: Use PLB and diaphragmatic breathing independently Short term:  Continue to wear her oxygen as prescribed and practice PLB   Long term:  Use albuterol as needed and continue PLB at home             Oxygen Discharge (Final Oxygen Re-Evaluation):  Oxygen Re-Evaluation - 11/22/22 1510       Program Oxygen Prescription   Program Oxygen Prescription None      Home Oxygen   Home Oxygen Device Portable Concentrator    Sleep Oxygen Prescription None    Home Exercise Oxygen Prescription None    Home Resting Oxygen  Prescription Pulsed   Pt wears her 02 15-20 min a day when she is on her computer   Liters per minute 2    Compliance with Home Oxygen Use Yes      Goals/Expected Outcomes   Short Term Goals To learn and exhibit compliance with exercise, home and travel O2 prescription;To learn and understand importance of monitoring SPO2 with pulse oximeter and demonstrate accurate use of the pulse oximeter.;To learn and understand importance of maintaining oxygen saturations>88%;To learn and demonstrate proper pursed lip breathing techniques or other breathing techniques. ;To learn and demonstrate proper use of respiratory medications    Long  Term Goals Exhibits compliance with exercise, home  and travel O2 prescription;Verbalizes importance of monitoring SPO2 with pulse oximeter and return demonstration;Maintenance of O2 saturations>88%;Exhibits proper breathing techniques, such as pursed lip breathing  or other method taught during program session;Compliance with respiratory medication;Demonstrates proper use of MDI's    Comments Pt does not wear 02 during exercise.  She states that her MD told her to wear 2L 02 at home while she is resting for 15-20 min a day.  She has a pulse ox at home and understands that her oxygen level needs to be above 88%.  She has an albuterol inhaler as needed but has not had to use it.    Goals/Expected Outcomes Short term:  Continue to wear her oxygen as prescribed and practice PLB   Long term:  Use albuterol as needed and continue PLB at home             Initial Exercise Prescription:  Initial Exercise Prescription - 08/22/22 1400       Date of Initial Exercise RX and Referring Provider   Date 08/22/22    Referring Provider Dr. Sherene Sires    Expected Discharge Date 01/03/23      Treadmill   MPH 1.3    Grade 0    Minutes 17      NuStep   Level 1    SPM 60    Minutes 22      Prescription Details   Frequency (times per week) 2    Duration Progress to 30 minutes of  continuous aerobic without signs/symptoms of physical distress      Intensity   THRR 40-80% of Max Heartrate 60-119    Ratings of Perceived Exertion 11-13    Perceived Dyspnea 0-4      Resistance Training   Training Prescription Yes    Weight 3    Reps 10-15             Perform Capillary Blood Glucose checks as needed.  Exercise Prescription Changes:   Exercise Prescription Changes     Row Name 08/28/22 1500 09/11/22 1500 09/25/22 1500 10/02/22 1500 10/23/22 1500     Response to Exercise   Blood Pressure (Admit) 112/58 120/60 120/60 112/62 130/60   Blood Pressure (Exercise) 126/60 110/58 130/60 124/74 130/60   Blood Pressure (Exit) 110/60 120/68 142/62 116/50 126/60   Heart Rate (Admit) 83 bpm 78 bpm 80 bpm 87 bpm 89 bpm   Heart Rate (Exercise) 86 bpm 95 bpm 100 bpm 96 bpm 100 bpm   Heart Rate (Exit) 85 bpm 77 bpm 85 bpm 81 bpm 90 bpm   Oxygen Saturation (Admit) 94 % 95 % 94 % 92 % 89 %   Oxygen Saturation (Exercise) 92 % 95 % 93 % 90 % 92 %   Oxygen Saturation (Exit) 95 % 96 % 94 % 94 % 93 %   Rating of Perceived Exertion (Exercise) 12 12 12 12 13    Perceived Dyspnea (Exercise) 12 12 12  0 1   Duration Continue with 30 min of aerobic exercise without signs/symptoms of physical distress. Continue with 30 min of aerobic exercise without signs/symptoms of physical distress. Continue with 30 min of aerobic exercise without signs/symptoms of physical distress. Continue with 30 min of aerobic exercise without signs/symptoms of physical distress. Continue with 30 min of aerobic exercise without signs/symptoms of physical distress.   Intensity THRR unchanged THRR unchanged THRR unchanged THRR unchanged THRR unchanged     Progression   Progression Continue to progress workloads to maintain intensity without signs/symptoms of physical distress. Continue to progress workloads to maintain intensity without signs/symptoms of physical distress. Continue to progress workloads to maintain  intensity without signs/symptoms  of physical distress. Continue to progress workloads to maintain intensity without signs/symptoms of physical distress. Continue to progress workloads to maintain intensity without signs/symptoms of physical distress.     Resistance Training   Training Prescription Yes Yes Yes Yes Yes   Weight 2 2 3 3 4    Reps 10-15 10-15 10-15 10-15 10-15   Time 10 Minutes 10 Minutes 10 Minutes -- --     Treadmill   MPH 1.3 1.5 1.6 1.5 1.5   Grade 0 0 0 0 0   Minutes 17 17 17 15 15    METs 2 2.15 2.23 2.15 2.15     NuStep   Level 1 2 2 2 2    SPM 40 79 67 55 82   Minutes 22 22 22 15 15    METs 1.7 1.9 1.8 1.6 1.9     Oxygen   Maintain Oxygen Saturation -- -- -- 88% or higher 88% or higher    Row Name 11/22/22 1500             Response to Exercise   Blood Pressure (Admit) 118/64       Blood Pressure (Exit) 112/60       Heart Rate (Admit) 90 bpm       Heart Rate (Exercise) 116 bpm       Heart Rate (Exit) 88 bpm       Oxygen Saturation (Admit) 90 %       Oxygen Saturation (Exercise) 92 %       Oxygen Saturation (Exit) 95 %       Rating of Perceived Exertion (Exercise) 12       Perceived Dyspnea (Exercise) 1       Duration Continue with 30 min of aerobic exercise without signs/symptoms of physical distress.       Intensity THRR unchanged         Progression   Progression Continue to progress workloads to maintain intensity without signs/symptoms of physical distress.         Resistance Training   Training Prescription Yes       Weight 4 / green band       Reps 10-15         Treadmill   MPH 1.7       Grade 0       Minutes 15       METs 2.3         NuStep   Level 3       SPM 63       Minutes 15       METs 1.6         Oxygen   Maintain Oxygen Saturation 88% or higher                Exercise Comments:   Exercise Goals and Review:   Exercise Goals     Row Name 08/22/22 1435 09/10/22 1412 10/09/22 1540         Exercise Goals    Increase Physical Activity Yes Yes Yes     Intervention Provide advice, education, support and counseling about physical activity/exercise needs.;Develop an individualized exercise prescription for aerobic and resistive training based on initial evaluation findings, risk stratification, comorbidities and participant's personal goals. Provide advice, education, support and counseling about physical activity/exercise needs.;Develop an individualized exercise prescription for aerobic and resistive training based on initial evaluation findings, risk stratification, comorbidities and participant's personal goals. Provide advice, education, support and counseling about physical activity/exercise needs.;Develop  an individualized exercise prescription for aerobic and resistive training based on initial evaluation findings, risk stratification, comorbidities and participant's personal goals.     Expected Outcomes Short Term: Attend rehab on a regular basis to increase amount of physical activity.;Long Term: Exercising regularly at least 3-5 days a week.;Long Term: Add in home exercise to make exercise part of routine and to increase amount of physical activity. Short Term: Attend rehab on a regular basis to increase amount of physical activity.;Long Term: Exercising regularly at least 3-5 days a week.;Long Term: Add in home exercise to make exercise part of routine and to increase amount of physical activity. Short Term: Attend rehab on a regular basis to increase amount of physical activity.;Long Term: Exercising regularly at least 3-5 days a week.;Long Term: Add in home exercise to make exercise part of routine and to increase amount of physical activity.     Increase Strength and Stamina Yes Yes Yes     Intervention Provide advice, education, support and counseling about physical activity/exercise needs.;Develop an individualized exercise prescription for aerobic and resistive training based on initial evaluation  findings, risk stratification, comorbidities and participant's personal goals. Provide advice, education, support and counseling about physical activity/exercise needs.;Develop an individualized exercise prescription for aerobic and resistive training based on initial evaluation findings, risk stratification, comorbidities and participant's personal goals. Provide advice, education, support and counseling about physical activity/exercise needs.;Develop an individualized exercise prescription for aerobic and resistive training based on initial evaluation findings, risk stratification, comorbidities and participant's personal goals.     Expected Outcomes Short Term: Increase workloads from initial exercise prescription for resistance, speed, and METs.;Short Term: Perform resistance training exercises routinely during rehab and add in resistance training at home;Long Term: Improve cardiorespiratory fitness, muscular endurance and strength as measured by increased METs and functional capacity ( ) Short Term: Increase workloads from initial exercise prescription for resistance, speed, and METs.;Short Term: Perform resistance training exercises routinely during rehab and add in resistance training at home;Long Term: Improve cardiorespiratory fitness, muscular endurance and strength as measured by increased METs and functional capacity ( ) Short Term: Increase workloads from initial exercise prescription for resistance, speed, and METs.;Short Term: Perform resistance training exercises routinely during rehab and add in resistance training at home;Long Term: Improve cardiorespiratory fitness, muscular endurance and strength as measured by increased METs and functional capacity ( )     Able to understand and use rate of perceived exertion (RPE) scale Yes Yes Yes     Intervention Provide education and explanation on how to use RPE scale Provide education and explanation on how to use RPE scale Provide education  and explanation on how to use RPE scale     Expected Outcomes Short Term: Able to use RPE daily in rehab to express subjective intensity level;Long Term:  Able to use RPE to guide intensity level when exercising independently Short Term: Able to use RPE daily in rehab to express subjective intensity level;Long Term:  Able to use RPE to guide intensity level when exercising independently Short Term: Able to use RPE daily in rehab to express subjective intensity level;Long Term:  Able to use RPE to guide intensity level when exercising independently     Able to understand and use Dyspnea scale Yes Yes Yes     Intervention Provide education and explanation on how to use Dyspnea scale Provide education and explanation on how to use Dyspnea scale Provide education and explanation on how to use Dyspnea scale     Expected Outcomes Short Term: Able  to use Dyspnea scale daily in rehab to express subjective sense of shortness of breath during exertion;Long Term: Able to use Dyspnea scale to guide intensity level when exercising independently Short Term: Able to use Dyspnea scale daily in rehab to express subjective sense of shortness of breath during exertion;Long Term: Able to use Dyspnea scale to guide intensity level when exercising independently Short Term: Able to use Dyspnea scale daily in rehab to express subjective sense of shortness of breath during exertion;Long Term: Able to use Dyspnea scale to guide intensity level when exercising independently     Knowledge and understanding of Target Heart Rate Range (THRR) Yes Yes Yes     Intervention Provide education and explanation of THRR including how the numbers were predicted and where they are located for reference Provide education and explanation of THRR including how the numbers were predicted and where they are located for reference Provide education and explanation of THRR including how the numbers were predicted and where they are located for reference      Expected Outcomes Short Term: Able to state/look up THRR;Long Term: Able to use THRR to govern intensity when exercising independently;Short Term: Able to use daily as guideline for intensity in rehab Short Term: Able to state/look up THRR;Long Term: Able to use THRR to govern intensity when exercising independently;Short Term: Able to use daily as guideline for intensity in rehab Short Term: Able to state/look up THRR;Long Term: Able to use THRR to govern intensity when exercising independently;Short Term: Able to use daily as guideline for intensity in rehab     Understanding of Exercise Prescription Yes Yes Yes     Intervention Provide education, explanation, and written materials on patient's individual exercise prescription Provide education, explanation, and written materials on patient's individual exercise prescription Provide education, explanation, and written materials on patient's individual exercise prescription     Expected Outcomes Short Term: Able to explain program exercise prescription;Long Term: Able to explain home exercise prescription to exercise independently Short Term: Able to explain program exercise prescription;Long Term: Able to explain home exercise prescription to exercise independently Short Term: Able to explain program exercise prescription;Long Term: Able to explain home exercise prescription to exercise independently              Exercise Goals Re-Evaluation :  Exercise Goals Re-Evaluation     Row Name 09/10/22 1413 10/09/22 1541 10/18/22 1501 10/24/22 1351 11/13/22 0915     Exercise Goal Re-Evaluation   Exercise Goals Review Increase Physical Activity;Increase Strength and Stamina;Able to understand and use rate of perceived exertion (RPE) scale;Able to understand and use Dyspnea scale;Knowledge and understanding of Target Heart Rate Range (THRR);Understanding of Exercise Prescription Increase Physical Activity;Increase Strength and Stamina;Able to understand and  use rate of perceived exertion (RPE) scale;Able to understand and use Dyspnea scale;Knowledge and understanding of Target Heart Rate Range (THRR);Understanding of Exercise Prescription Increase Physical Activity;Increase Strength and Stamina;Understanding of Exercise Prescription -- Increase Physical Activity;Understanding of Exercise Prescription;Increase Strength and Stamina   Comments Pt has completed 4 sessions of pulmonary rehab. She is very motivated to be in the program and to improve her health. She is tolerating exercise and increasing her workload. She sometimes has to use the head O2 band due to her nail polish and the meter not reading properly. She is currently exercising at 2.0 METs on the treadmill. Will continue to monitor and progress as able. Pt has completed 10 sessions of pulmonary rehab. She continues to be motivated to be in the  program and is enjoying coming to class and exercising. She is increasing her workloads. She is currently exercising at 2.15 METs on the treadmill. Will continue to monitor and progress as able. Pt has complete 12 visits of PR. Pt states that she has seen improvment in her stamina and strgenth since starting the program. She is able to walk furthur without taking extra breaks due to SOB. Daijanae has increased her SPM on the NuStep. She keeps her speed at 1.5 on the treadmill while RPE is an 13. WIll continue to monitor and progress as able Mahya has been   Expected Outcomes Through exercise at rehab and home, patient will achieve their goals. Through exercise at rehab and home, patient will achieve their goals. Short term : go over home exercise  long term: keep improving workload and building endurance -- --    Row Name 11/22/22 1504 11/27/22 1429           Exercise Goal Re-Evaluation   Exercise Goals Review Increase Physical Activity;Increase Strength and Stamina;Understanding of Exercise Prescription Increase Physical Activity;Able to understand and use  rate of perceived exertion (RPE) scale;Increase Strength and Stamina;Able to understand and use Dyspnea scale;Understanding of Exercise Prescription      Comments Pt feels like she has gotten stronger and has more endurance since starting PR.  Pt has increased her speed to 1.7 on the treadmill and she is going to try to increase to level 3 on the Nustep today.  She is currently walking 2 days a week at home for about 10 min. Korra has been doing great with exericse. She has increased her speed on the treadmill to 1.7 and her level on the stepper to 3.0. Will continue to monitor and progress asa able      Expected Outcomes Short term:  Pt will try to increase to 15-20 min each time she walks at home         Long term:  Pt will continue to build on her walking at home and continue to come to PR Short term: increase walking grade to 0.5 - 1.0   long term: continue to attend PR               Discharge Exercise Prescription (Final Exercise Prescription Changes):  Exercise Prescription Changes - 11/22/22 1500       Response to Exercise   Blood Pressure (Admit) 118/64    Blood Pressure (Exit) 112/60    Heart Rate (Admit) 90 bpm    Heart Rate (Exercise) 116 bpm    Heart Rate (Exit) 88 bpm    Oxygen Saturation (Admit) 90 %    Oxygen Saturation (Exercise) 92 %    Oxygen Saturation (Exit) 95 %    Rating of Perceived Exertion (Exercise) 12    Perceived Dyspnea (Exercise) 1    Duration Continue with 30 min of aerobic exercise without signs/symptoms of physical distress.    Intensity THRR unchanged      Progression   Progression Continue to progress workloads to maintain intensity without signs/symptoms of physical distress.      Resistance Training   Training Prescription Yes    Weight 4 / green band    Reps 10-15      Treadmill   MPH 1.7    Grade 0    Minutes 15    METs 2.3      NuStep   Level 3    SPM 63    Minutes 15    METs  1.6      Oxygen   Maintain Oxygen Saturation 88% or  higher             Nutrition:  Target Goals: Understanding of nutrition guidelines, daily intake of sodium 1500mg , cholesterol 200mg , calories 30% from fat and 7% or less from saturated fats, daily to have 5 or more servings of fruits and vegetables.  Biometrics:  Pre Biometrics - 08/22/22 1436       Pre Biometrics   Height 5\' 4"  (1.626 m)    Weight 126 lb 15.8 oz (57.6 kg)    Waist Circumference 33 inches    Hip Circumference 37 inches    Waist to Hip Ratio 0.89 %    BMI (Calculated) 21.79    Triceps Skinfold 29 mm    % Body Fat 35.7 %    Grip Strength 21.2 kg    Flexibility 0 in    Single Leg Stand 10.43 seconds              Nutrition Therapy Plan and Nutrition Goals:  Nutrition Therapy & Goals - 10/01/22 1419       Nutrition Therapy   RD appointment deferred Yes      Personal Nutrition Goals   Comments We provide educational sessions on heart healthy nutrition with handouts.      Intervention Plan   Intervention Nutrition handout(s) given to patient.    Expected Outcomes Short Term Goal: Understand basic principles of dietary content, such as calories, fat, sodium, cholesterol and nutrients.             Nutrition Assessments:  Nutrition Assessments - 08/22/22 1336       MEDFICTS Scores   Pre Score 39            MEDIFICTS Score Key: >=70 Need to make dietary changes  40-70 Heart Healthy Diet <= 40 Therapeutic Level Cholesterol Diet   Picture Your Plate Scores: <21 Unhealthy dietary pattern with much room for improvement. 41-50 Dietary pattern unlikely to meet recommendations for good health and room for improvement. 51-60 More healthful dietary pattern, with some room for improvement.  >60 Healthy dietary pattern, although there may be some specific behaviors that could be improved.    Nutrition Goals Re-Evaluation:  Nutrition Goals Re-Evaluation     Row Name 10/18/22 1509 11/22/22 1526           Goals   Nutrition Goal  Healthy Eating --      Comment States that she loves to eat ice cream and eats muiltiple times a week. Eats more chicken and fish then red meat. Pts wants to maintain weight and is fine with how she eats. Eats a good amount of veggies. Pt does not eat much red meat, because she prefers chicken and fish.  She states that the chicken she eats is grilled but the fish is fried.  Pt does not each much fruit but does eat a lot of vegetables.  She loves to eat sweets and eats something sweet every day.      Expected Outcome Short term goal:cut down on sweets to maintain wright    Long term goal : maintain healthy eat and weight Short term:  Pt will try to cut down on her sweets to 5 days a week vs 7  Long term:  Pt will try grilled/baked seafood to try to maintain a healthy balance of foods               Nutrition  Goals Discharge (Final Nutrition Goals Re-Evaluation):  Nutrition Goals Re-Evaluation - 11/22/22 1526       Goals   Comment Pt does not eat much red meat, because she prefers chicken and fish.  She states that the chicken she eats is grilled but the fish is fried.  Pt does not each much fruit but does eat a lot of vegetables.  She loves to eat sweets and eats something sweet every day.    Expected Outcome Short term:  Pt will try to cut down on her sweets to 5 days a week vs 7  Long term:  Pt will try grilled/baked seafood to try to maintain a healthy balance of foods             Psychosocial: Target Goals: Acknowledge presence or absence of significant depression and/or stress, maximize coping skills, provide positive support system. Participant is able to verbalize types and ability to use techniques and skills needed for reducing stress and depression.  Initial Review & Psychosocial Screening:  Initial Psych Review & Screening - 08/22/22 1328       Initial Review   Current issues with Current Sleep Concerns;Current Anxiety/Panic      Family Dynamics   Good Support System? Yes     Comments Pt has a good support sytem with her husband, her daughters, and her grandchildren.      Barriers   Psychosocial barriers to participate in program There are no identifiable barriers or psychosocial needs.      Screening Interventions   Interventions Encouraged to exercise;Provide feedback about the scores to participant;To provide support and resources with identified psychosocial needs    Expected Outcomes Short Term goal: Utilizing psychosocial counselor, staff and physician to assist with identification of specific Stressors or current issues interfering with healing process. Setting desired goal for each stressor or current issue identified.;Long Term Goal: Stressors or current issues are controlled or eliminated.;Short Term goal: Identification and review with participant of any Quality of Life or Depression concerns found by scoring the questionnaire.;Long Term goal: The participant improves quality of Life and PHQ9 Scores as seen by post scores and/or verbalization of changes             Quality of Life Scores:  Quality of Life - 08/22/22 1450       Quality of Life   Select Quality of Life      Quality of Life Scores   Health/Function Pre 22.53 %    Socioeconomic Pre 29.17 %    Psych/Spiritual Pre 29.64 %    Family Pre 28.8 %    GLOBAL Pre 26.09 %            Scores of 19 and below usually indicate a poorer quality of life in these areas.  A difference of  2-3 points is a clinically meaningful difference.  A difference of 2-3 points in the total score of the Quality of Life Index has been associated with significant improvement in overall quality of life, self-image, physical symptoms, and general health in studies assessing change in quality of life.   PHQ-9: Review Flowsheet       08/22/2022  Depression screen PHQ 2/9  Decreased Interest 0  Down, Depressed, Hopeless 0  PHQ - 2 Score 0  Altered sleeping 1  Tired, decreased energy 0  Change in  appetite 0  Feeling bad or failure about yourself  0  Trouble concentrating 0  Moving slowly or fidgety/restless 0  Suicidal thoughts 0  PHQ-9 Score 1  Difficult doing work/chores Not difficult at all    Details           Interpretation of Total Score  Total Score Depression Severity:  1-4 = Minimal depression, 5-9 = Mild depression, 10-14 = Moderate depression, 15-19 = Moderately severe depression, 20-27 = Severe depression   Psychosocial Evaluation and Intervention:  Psychosocial Evaluation - 08/22/22 1419       Psychosocial Evaluation & Interventions   Interventions Encouraged to exercise with the program and follow exercise prescription;Stress management education;Relaxation education    Comments Pt has no identifiable psychosocial barriers to completing the PR program.  She is excited to start the program and improve her overall health.  She has a good support system in her husband, daughters, and grandchildren.  She scored a 1 on her PHQ-9 related to the fact that she has trouble falling asleep at times, but the pt does take a Xanax at night as needed to help her sleep and reduce anxiety.  Right now the pt feels like the Xanax is controlling her anxiety and insomnia.  She quit smoking in March of 2023 after smoking for 55 years.  She does have some lower back pain related to arthritis, and she is still having "heaviness" in her legs.  She is able to manage her back pain with Tylenol and stretching, and she hopes that once she loses some weight the leg "heaviness" will subside.  Her goals for the program are to breathe better with less shortness of breath and be able to walk/shop more.  We will monitor her progress as she works toward meeting these goals.    Expected Outcomes Pt's anxiety/insomnia will continue to be managed with Xanax, and she will continue to have no idenfiable psychosocial issues.    Continue Psychosocial Services  No Follow up required              Psychosocial Re-Evaluation:  Psychosocial Re-Evaluation     Row Name 09/04/22 1155 10/01/22 1420 10/18/22 1506 11/22/22 1521       Psychosocial Re-Evaluation   Current issues with Current Sleep Concerns Current Sleep Concerns Current Sleep Concerns Current Psychotropic Meds    Comments Patient is new to the program completing 2 sessions.  She is currently is being treated via PCP for insomnia with xanax prn.  Patient was referred to PR by Dr. 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  Her initial PHQ-9 score is 1 and her QOL score is 26.09%.  We will continue to monitor  her progress as she works toward meeting these goals. Patient has completed 9 sessions. Her insomina is managed with Xanax prn. He continues to have no psychosocial barriers identified. She enjoys the program and demonstrates an interest in improving her health. We will continue to monitor her progress. Pt has seen an improvemnt in her sleep patterns, being able to have a comforable sleep night. Has been able to decrease medication usage to sleep. Pt states that she is sleeping fine now.  She only takes Xanax "every once in a while."  She denies any problems with anxiety/depression.    Expected Outcomes Patient will have no psychosocial barriers identified at discharge. Patient will have no psychosocial barriers identified at discharge. Short term Goal: continue to improve sleep and decrease medication useage Long term Goals; Continue to have no barries identified Short term:   Continue to practice healthy sleeping habits   Long term:  Pt will continue to  have no identifiable psychosocial barriers    Interventions Relaxation education;Encouraged to attend Pulmonary Rehabilitation for the exercise;Stress management education Relaxation education;Encouraged to attend Pulmonary Rehabilitation for the exercise;Stress management education Encouraged to attend Pulmonary Rehabilitation for the exercise;Relaxation  education;Stress management education Stress management education;Relaxation education;Encouraged to attend Pulmonary Rehabilitation for the exercise    Continue Psychosocial Services  No Follow up required No Follow up required Follow up required by staff Follow up required by staff             Psychosocial Discharge (Final Psychosocial Re-Evaluation):  Psychosocial Re-Evaluation - 11/22/22 1521       Psychosocial Re-Evaluation   Current issues with Current Psychotropic Meds    Comments Pt states that she is sleeping fine now.  She only takes Xanax "every once in a while."  She denies any problems with anxiety/depression.    Expected Outcomes Short term:   Continue to practice healthy sleeping habits   Long term:  Pt will continue to have no identifiable psychosocial barriers    Interventions Stress management education;Relaxation education;Encouraged to attend Pulmonary Rehabilitation for the exercise    Continue Psychosocial Services  Follow up required by staff              Education: Education Goals: Education classes will be provided on a weekly basis, covering required topics. Participant will state understanding/return demonstration of topics presented.  Learning Barriers/Preferences:  Learning Barriers/Preferences - 08/22/22 1336       Learning Barriers/Preferences   Learning Barriers None    Learning Preferences Written Material             Education Topics: How Lungs Work and Diseases: - Discuss the anatomy of the lungs and diseases that can affect the lungs, such as COPD. Flowsheet Row PULMONARY REHAB OTHER RESPIRATORY from 11/29/2022 in [place] [facility]  Date 11/29/22  Educator [facility]  Instruction Review Code 1- Verbalizes Understanding       Exercise: -Discuss the importance of exercise, FITT principles of exercise, normal and abnormal responses to exercise, and how to exercise safely.   Environmental Irritants: -Discuss types of  environmental irritants and how to limit exposure to environmental irritants.   Meds/Inhalers and oxygen: - Discuss respiratory medications, definition of an inhaler and oxygen, and the proper way to use an inhaler and oxygen.   Energy Saving Techniques: - Discuss methods to conserve energy and decrease shortness of breath when performing activities of daily living.  Flowsheet Row PULMONARY REHAB OTHER RESPIRATORY from 11/29/2022 in [place] [facility]  Date 10/18/22  Educator DM  Instruction Review Code 1- Verbalizes Understanding       Bronchial Hygiene / Breathing Techniques: - Discuss breathing mechanics, pursed-lip breathing technique,  proper posture, effective ways to clear airways, and other functional breathing techniques Flowsheet Row PULMONARY REHAB OTHER RESPIRATORY from 11/29/2022 in [place] [facility]  Date 10/25/22  Educator HB       Cleaning Equipment: - Provides group verbal and written instruction about the health risks of elevated stress, cause of high stress, and healthy ways to reduce stress. Flowsheet Row PULMONARY REHAB OTHER RESPIRATORY from 11/29/2022 in [place] [facility]  Date 11/01/22  Educator [facility]  Instruction Review Code 1- Verbalizes Understanding       Nutrition I: Fats: - Discuss the types of cholesterol, what cholesterol does to the body, and how cholesterol levels can be controlled.   Nutrition II: Labels: -Discuss the different components of food labels and how to  read food labels.   Respiratory Infections: - Discuss the signs and symptoms of respiratory infections, ways to prevent respiratory infections, and the importance of seeking medical treatment when having a respiratory infection. Flowsheet Row PULMONARY REHAB OTHER RESPIRATORY from 11/29/2022 in [place] [facility]  Date 08/30/22  Educator Handout       Stress I: Signs and Symptoms: - Discuss the causes of stress, how  stress may lead to anxiety and depression, and ways to limit stress. Flowsheet Row PULMONARY REHAB OTHER RESPIRATORY from 11/29/2022 in [employer] [facility]  Date 09/06/22  Educator handout       Stress II: Relaxation: -Discuss relaxation techniques to limit stress. Flowsheet Row PULMONARY REHAB OTHER RESPIRATORY from 11/29/2022 in [place] [facility]  Date 09/13/22  Educator handout       Oxygen for Home/Travel: - Discuss how to prepare for travel when on oxygen and proper ways to transport and store oxygen to ensure safety. Flowsheet Row PULMONARY REHAB OTHER RESPIRATORY from 11/29/2022 in [place] [facility]  Date 09/20/22  Educator handout       Knowledge Questionnaire Score:  Knowledge Questionnaire Score - 08/22/22 1337       Knowledge Questionnaire Score   Pre Score 12/18             Core Components/Risk Factors/Patient Goals at Admission:  Personal Goals and Risk Factors at Admission - 08/22/22 1342       Core Components/Risk Factors/Patient Goals on Admission    Weight Management Weight Loss    Improve shortness of breath with ADL's Yes    Intervention Provide education, individualized exercise plan and daily activity instruction to help decrease symptoms of SOB with activities of daily living.    Expected Outcomes Short Term: Improve cardiorespiratory fitness to achieve a reduction of symptoms when performing ADLs;Long Term: Be able to perform more ADLs without symptoms or delay the onset of symptoms    Increase knowledge of respiratory medications and ability to use respiratory devices properly  Yes    Intervention Provide education and demonstration as needed of appropriate use of medications, inhalers, and oxygen therapy.    Expected Outcomes Short Term: Achieves understanding of medications use. Understands that oxygen is a medication prescribed by physician. Demonstrates appropriate use of inhaler and oxygen  therapy.;Long Term: Maintain appropriate use of medications, inhalers, and oxygen therapy.    Hypertension Yes    Intervention Provide education on lifestyle modifcations including regular physical activity/exercise, weight management, moderate sodium restriction and increased consumption of fresh fruit, vegetables, and low fat dairy, alcohol moderation, and smoking cessation.    Expected Outcomes Short Term: Continued assessment and intervention until BP is < 140/38mm HG in hypertensive participants. < 130/72mm HG in hypertensive participants with diabetes, heart failure or chronic kidney disease.;Long Term: Maintenance of blood pressure at goal levels.    Personal Goal Other Yes    Personal Goal Pt wants to breathe better with less shortness of breath and be able to walk more and shop.    Intervention Pt will attend PR twice a week and start a home exercise program.    Expected Outcomes Pt will attend PR twice a week and complete the program meeting both her personal and program goals.             Core Components/Risk Factors/Patient Goals Review:   Goals and Risk Factor Review     Row Name 09/04/22 1206 10/01/22 1423 10/18/22 1515 11/22/22 1534  Core Components/Risk Factors/Patient Goals Review   Personal Goals Review Improve shortness of breath with ADL's;Develop more efficient breathing techniques such as purse lipped breathing and diaphragmatic breathing and practicing self-pacing with activity. Improve shortness of breath with ADL's;Develop more efficient breathing techniques such as purse lipped breathing and diaphragmatic breathing and practicing self-pacing with activity. Improve shortness of breath with ADL's;Develop more efficient breathing techniques such as purse lipped breathing and diaphragmatic breathing and practicing self-pacing with activity. Weight Management/Obesity;Hypertension;Increase knowledge of respiratory medications and ability to use respiratory devices  properly.;Develop more efficient breathing techniques such as purse lipped breathing and diaphragmatic breathing and practicing self-pacing with activity.    Review Patient is new to the PR program attending 2 sessions.  She seems to enjoy class and interactive with class and staff.  Patient was referred to PR by Dr. 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  Her personal goals for the program are to have less SOB , breath better and be abe to walk more while shopping.  She tolerates exercise well while on Treadmill and nu-step with O2 sats maintaining at 92% without any oxygen supplemental needed at this time.  We will continue to monitor as she works toward meeting these goals as she progresses in the program. Patient had completed 9 sessions. She is doing well in the program with consistent attendance. Her current weight is 57.3 KG down 0.9 KG from last 30 day review. She tolerates exercise well while on Treadmill and nu-step with O2 sats maintaining at 93% without any oxygen supplemental needed at this time. Her personal goals for the program are to have less SOB , breath better and be abe to walk more while shopping. We will continue to monitor as she works toward meeting these goals as she progresses in the program. Pt has been able to improve her SOB with her ADL's. Was able to shop with family and not have to take muilple breaks. Have not needed to use pursed lip breathing tech, has not pushed herself to the limit to need inhaler. Pt's weight has fluctuated but she wants to get down to 120 lbs.  Her current weight is 129 lbs and she knows that she needs to cut down on her sweets in order to lose weight.  She also mentioned that if she lost the extra weight that might make her legs feel less tired.  She is no longer on any BP medication.    Expected Outcomes Patient will complete the program meeting both program and personal goals. Patient will complete the program meeting both  program and personal goals. short term goal: going over pursed lip breathing long term goals: continue to improve SOB with ADL's Short term:  Pt is going to try to adjust her diet to lose weight in order to decrease her SOB even more  Long term:  Continue to have a positive attitude and willingness to improve her overall health             Core Components/Risk Factors/Patient Goals at Discharge (Final Review):   Goals and Risk Factor Review - 11/22/22 1534       Core Components/Risk Factors/Patient Goals Review   Personal Goals Review Weight Management/Obesity;Hypertension;Increase knowledge of respiratory medications and ability to use respiratory devices properly.;Develop more efficient breathing techniques such as purse lipped breathing and diaphragmatic breathing and practicing self-pacing with activity.    Review Pt's weight has fluctuated but she wants to get down to 120 lbs.  Her current weight is 129 lbs and she knows that she needs to cut down on her sweets in order to lose weight.  She also mentioned that if she lost the extra weight that might make her legs feel less tired.  She is no longer on any BP medication.    Expected Outcomes Short term:  Pt is going to try to adjust her diet to lose weight in order to decrease her SOB even more  Long term:  Continue to have a positive attitude and willingness to improve her overall health             ITP Comments:  ITP Comments     Row Name 10/10/22 1613 11/07/22 1519 12/05/22 1526       ITP Comments 30 day review completed. ITP sent to Dr.Jehanzeb Memon, Medical Director of  Pulmonary Rehab. Continue with ITP unless changes are made by physician. 30 day review completed. ITP sent to Dr.Jehanzeb Memon, Medical Director of  Pulmonary Rehab. Continue with ITP unless changes are made by physician. 30 day review completed. ITP sent to Dr.Jehanzeb Memon, Medical Director of  Pulmonary Rehab. Continue with ITP unless changes are made by  physician.              Comments: 30 day review

## 2022-12-06 ENCOUNTER — Encounter (HOSPITAL_COMMUNITY)
Admission: RE | Admit: 2022-12-06 | Discharge: 2022-12-06 | Disposition: A | Discharge status: Home / Self Care | Payer: Medicare Other | Source: Ambulatory Visit | Attending: Family Medicine | Admitting: Family Medicine

## 2022-12-06 ENCOUNTER — Encounter (HOSPITAL_COMMUNITY): Payer: Medicare Other

## 2022-12-06 DIAGNOSIS — J449 Chronic obstructive pulmonary disease, unspecified: Secondary | ICD-10-CM

## 2022-12-06 DIAGNOSIS — J9611 Chronic respiratory failure with hypoxia: Secondary | ICD-10-CM

## 2022-12-06 NOTE — Progress Notes (Signed)
Daily Session Note  Patient Details  Name: Desiree Barber MRN: [id number] Date of Birth: [birth year] Referring Provider:   Flowsheet Row PULMONARY REHAB OTHER RESP ORIENTATION from 08/22/2022 in [facility] CARDIAC REHABILITATION  Referring Provider Dr. Sherene Sires       Encounter Date: 12/06/2022  Check In:  Session Check In - 12/06/22 1430       Check-In   Supervising physician immediately available to respond to emergencies See telemetry face sheet for immediately available ER MD    Location [facility]    Staff Present Rodena Medin, RN, BSN;Hillary Troutman BSN, RN;Daphyne Daphine Deutscher, RN, BSN;Heather Fredric Mare, BS, Exercise Physiologist    Virtual Visit No    Medication changes reported     No    Fall or balance concerns reported    No    Warm-up and Cool-down Performed on first and last piece of equipment    Resistance Training Performed Yes    PAD/SET Patient? No      Pain Assessment   Currently in Pain? No/denies    Pain Score 0-No pain    Multiple Pain Sites No             Capillary Blood Glucose: No results found for this or any previous visit (from the past 24 hour(s)).    Social History   Tobacco Use  Smoking Status Former   Current packs/day: 0.00   Average packs/day: 0.5 packs/day for 55.0 years (27.5 ttl pk-yrs)   Types: Cigarettes   Start date: 05/30/1966   Quit date: 05/29/2021   Years since quitting: 1.5   Passive exposure: Past  Smokeless Tobacco Former  Tobacco Comments   Quit date 05/29/2021    Goals Met:  Proper associated with RPD/PD & O2 Sat Independence with exercise equipment Using PLB without cueing & demonstrates good technique Exercise tolerated well No report of concerns or symptoms today Strength training completed today  Goals Unmet:  Not Applicable  Comments: Pt able to follow exercise prescription today without complaint.  Will continue to monitor for progression.    Dr. Erick Blinks is Medical Director for  [facility] Pulmonary Rehab.

## 2022-12-11 ENCOUNTER — Encounter (HOSPITAL_COMMUNITY): Payer: Medicare Other

## 2022-12-12 ENCOUNTER — Encounter: Payer: Self-pay | Admitting: Vascular Surgery

## 2022-12-12 NOTE — Progress Notes (Signed)
Desiree Barber, female    DOB: 08-20-1950    MRN: [id number]  Brief patient profile:  72  yowf  quit smoking 05/29/21/MM with doe/cough which improved p quit smoking  referred to pulmonary clinic in [place]  06/13/2021 by Lenise Herald PA for ? 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    History of Present Illness  06/13/2021  Pulmonary/ 1st office eval/ Jude Naclerio / [place] Office  Chief Complaint  Patient presents with   Consult    Consult for COPD with hypoxia and need for O2 form [facility]   Dyspnea:  12-15 min walks 1/2 mile some inclines s stopping on incruse x > year. 02 sats now ok p stops but not checking at peak ex  Cough: none  Sleep: well / on side bed is flat  SABA use: none   Rec Congratulations on not smoking -  it's the most important aspect of your care.  Ok to try off incruse to see if it affects your wind /breathing when walking and if worse consider restarting incruse or substituting anoro (the highest octane)  > did fine off incruse      07/10/2022  f/u ov/[place] office/Nashley [place] re: copd /bronchiectais maint on anoro   Chief Complaint  Patient presents with   Follow-up    Pt f/u for increased SOB - alternating between Anoro and Trelegy   Dyspnea:  pushing cart [employer] fine / not checking sats with ex  Cough: better now  Sleeping: flat bed/ one pillow  SABA use: not using  02: POC not using  Rec Plan A = Automatic = Always=    Anoro one click each am - tug initially  Plan B = Backup (to supplement plan A, not to replace it) Only use your albuterol inhaler as a rescue medication  Plan C = Crisis (instead of Plan B but only if Plan B stops working) - only use your albuterol nebulizer if you first try Plan B  Keep up with your vaccinations  My office will be contacting you by phone for referral to pulmonary rehab  - if you don't hear back from my office within one week please call us back or notify us thru MyChart and we'll address it right away.   Late add: Make sure you check your oxygen saturation  AT  your highest level of activity (not after you stop)  to  be sure it stays over 90%     12/13/2022  f/u ov/[place] office/Olson Lucarelli re: copd/ bronchiectasis  maint on Anoro   Chief Complaint  Patient presents with   Follow-up    6 month follow up   Dyspnea:  pulling weeds on knees due to abd fullness attributed to over eating ever since she quit smoking  Pulmonary Rehab tol [place] and sats low 90s Cough: no Sleeping: flat bed/ one pillow s    resp cc  SABA use: not helpful hfa or neb  02: none - has POC using at rest   Lung cancer screening: q jan 204    No obvious day to day or daytime variability or assoc excess/ purulent sputum or mucus plugs or hemoptysis or cp or chest tightness, subjective wheeze or overt sinus or hb symptoms.    Also denies any obvious fluctuation of symptoms with weather or environmental changes or other aggravating or alleviating factors except as outlined above   No unusual exposure hx or h/o childhood pna/ asthma or knowledge of premature  birth.  Current Allergies, Complete Past Medical History, Past Surgical History, Family History, and Social History were reviewed in [employer] record.  ROS  The following are not active complaints unless bolded Hoarseness, sore throat, dysphagia, dental problems, itching, sneezing,  nasal congestion or discharge of excess mucus or purulent secretions, ear ache,   fever, chills, sweats, unintended wt loss or wt gain, classically pleuritic or exertional cp,  orthopnea pnd or arm/hand swelling  or leg swelling, presyncope, palpitations, abdominal pain, anorexia, nausea, vomiting, diarrhea  or change in bowel habits or change in bladder habits, change in stools or change in urine, dysuria, hematuria,  rash, arthralgias, visual complaints, headache, numbness, weakness or ataxia or problems with walking or coordination,  change in mood or  memory.         Current Meds  Medication Sig   albuterol (PROVENTIL) (2.5 MG/3ML) 0.083% nebulizer solution Up to every 4 hours as needed   albuterol (VENTOLIN HFA) 108 (90 Base) MCG/ACT inhaler Inhale into the lungs.   ALPRAZolam (XANAX) 0.25 MG tablet Take 0.25 mg by mouth daily as needed for anxiety or sleep.   Ascorbic Acid (VITAMIN C) 100 MG tablet Take 100 mg by mouth daily.   clopidogrel (PLAVIX) 75 MG tablet Take 1 tablet (75 mg total) by mouth daily.   pantoprazole (PROTONIX) 40 MG tablet Take 1 tablet (40 mg total) by mouth 2 (two) times daily.   TRELEGY ELLIPTA 100-62.5-25 MCG/ACT AEPB Inhale 1 puff into the lungs daily.   umeclidinium-vilanterol (ANORO ELLIPTA) 62.5-25 MCG/ACT AEPB Inhale 1 puff into the lungs daily.   vitamin B-12 (CYANOCOBALAMIN) 100 MCG tablet Take 100 mcg by mouth daily.   VITAMIN D, CHOLECALCIFEROL, PO Take 1 tablet by mouth daily. One daily                 Past Medical History:  Diagnosis Date   Anxiety    Occasional   Arthritis    COPD (chronic obstructive pulmonary disease) (HCC)    HTN (hypertension)        Objective:    Wts  12/13/2022       131  07/10/2022       125  02/14/2022     117   01/05/22 119 lb 6.4 oz (54.2 kg)  01/05/22 119 lb 12.8 oz (54.3 kg)  12/24/21 115 lb 3.2 oz (52.3 kg)    Vital signs reviewed  12/13/2022  - Note at rest 02 sats  ? 89% on RA (poor signal)   General appearance:    pleasant amb wf  nad    HEENT : Oropharynx  clear   Nasal turbinates nl    NECK :  without  apparent JVD/ palpable Nodes/TM    LUNGS: no acc muscle use,  Mild barrel  contour chest wall with bilateral  Distant bs s audible wheeze and  without cough on insp or exp maneuvers  and mild  Hyperresonant  to  percussion bilaterally     CV:  RRR  no s3 or murmur or increase in P2, and no edema   ABD:  somewhat tensely obese but nontender  / no shifting dullness or organomegaly noted  MS:  Nl gait/ ext warm without deformities Or obvious joint  restrictions  calf tenderness, cyanosis - Mild clubbing  SKIN: warm and dry without lesions    NEURO:  alert, approp, nl sensorium with  no motor or cerebellar deficits apparent.  Assessment

## 2022-12-13 ENCOUNTER — Ambulatory Visit (INDEPENDENT_AMBULATORY_CARE_PROVIDER_SITE_OTHER): Payer: Medicare Other | Admitting: Internal Medicine

## 2022-12-13 ENCOUNTER — Encounter: Payer: Self-pay | Admitting: Internal Medicine

## 2022-12-13 ENCOUNTER — Encounter (HOSPITAL_COMMUNITY)
Admission: RE | Admit: 2022-12-13 | Discharge: 2022-12-13 | Disposition: A | Discharge status: Home / Self Care | Payer: Medicare Other | Source: Ambulatory Visit | Attending: Family Medicine | Admitting: Family Medicine

## 2022-12-13 ENCOUNTER — Encounter (HOSPITAL_COMMUNITY): Payer: Medicare Other

## 2022-12-13 VITALS — BP 140/55 | HR 52 | Wt 131.0 lb

## 2022-12-13 DIAGNOSIS — J449 Chronic obstructive pulmonary disease, unspecified: Secondary | ICD-10-CM | POA: Diagnosis not present

## 2022-12-13 DIAGNOSIS — J9611 Chronic respiratory failure with hypoxia: Secondary | ICD-10-CM

## 2022-12-13 MED ORDER — ANORO ELLIPTA 62.5-25 MCG/ACT IN AEPB: 1.0000 | INHALATION_SPRAY | Freq: Every day | RESPIRATORY_TRACT | 11 refills | Status: DC

## 2022-12-13 NOTE — Patient Instructions (Addendum)
Plan A = Automatic = Always=    Anoro one click each am  - take two good strong drags  Plan B = Backup (to supplement plan A, not to replace it) Only use your albuterol inhaler as a rescue medication to be used if you can't catch your breath by resting or doing a relaxed purse lip breathing pattern.  - The less you use it, the better it will work when you need it. - Ok to use the inhaler up to 2 puffs  every 4 hours if you must but call for appointment if use goes up over your usual need - Don't leave home without it !!  (think of it like the spare tire for your car)   Plan C = Crisis (instead of Plan B but only if Plan B stops working) - only use your albuterol nebulizer if you first try Plan B and it fails to help > ok to use the nebulizer up to every 4 hours but if start needing it regularly call for immediate appointment   Also  Ok to try albuterol 15 min before an activity (on alternating days)  that you know would usually make you short of breath and see if it makes any difference and if makes none then don't take albuterol after activity unless you can't catch your breath as this means it's the resting that helps, not the albuterol.   Please schedule a follow up visit in 6  months but call sooner if needed

## 2022-12-13 NOTE — Progress Notes (Signed)
Daily Session Note  Patient Details  Name: Desiree Barber MRN: [id number] Date of Birth: [birth year] Referring Provider:   Flowsheet Row PULMONARY REHAB OTHER RESP ORIENTATION from 08/22/2022 in [facility] CARDIAC REHABILITATION  Referring Provider Dr. Sherene Sires       Encounter Date: 12/13/2022  Check In:  Session Check In - 12/13/22 1513       Check-In   Supervising physician immediately available to respond to emergencies See telemetry face sheet for immediately available MD    Location [facility]    Staff Present Ross Ludwig, BS, Exercise Physiologist;Jessica Juanetta Gosling, MA, RCEP, CCRP, Harolyn Rutherford, RN, BSN;Hillary Troutman BSN, RN    Virtual Visit No    Medication changes reported     No    Fall or balance concerns reported    No    Tobacco Cessation No Change    Warm-up and Cool-down Performed on first and last piece of equipment    Resistance Training Performed Yes    VAD Patient? No      Pain Assessment   Currently in Pain? No/denies             Capillary Blood Glucose: No results found for this or any previous visit (from the past 24 hour(s)).    Social History   Tobacco Use  Smoking Status Former   Current packs/day: 0.00   Average packs/day: 0.5 packs/day for 55.0 years (27.5 ttl pk-yrs)   Types: Cigarettes   Start date: 05/30/1966   Quit date: 05/29/2021   Years since quitting: 1.5   Passive exposure: Past  Smokeless Tobacco Former  Tobacco Comments   Quit date 05/29/2021    Goals Met:  Proper associated with RPD/PD & O2 Sat Independence with exercise equipment Using PLB without cueing & demonstrates good technique Exercise tolerated well Queuing for purse lip breathing No report of concerns or symptoms today Strength training completed today  Goals Unmet:  Not Applicable  Comments: Pt able to follow exercise prescription today without complaint.  Will continue to monitor for progression.    Dr. Erick Blinks is  Medical Director for [facility] Pulmonary Rehab.

## 2022-12-14 NOTE — Assessment & Plan Note (Addendum)
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  - referred to pulmonary rehab 07/10/2022 > tol well as  of 12/13/2022  - 12/13/2022 changed trelegy to anoro due to wt gain on trelegy   Now that she's quit smoking really more of  a  Group B in terms of symptom/risk and laba/lama therefore appropriate rx at this point >>>  change to anoro as long as not having aecopd  but use saba more approp :  Re SABA :  I spent extra time with pt today reviewing appropriate use of albuterol for prn use on exertion with the following points: 1) saba is for relief of sob that does not improve by walking a slower pace or resting but rather if the pt does not improve after trying this first. 2) If the pt is convinced, as many are, that saba helps recover from activity faster then it's easy to tell if this is the case by re-challenging : ie stop, take the inhaler, then p 5 minutes try the exact same activity (intensity of workload) that just caused the symptoms and see if they are substantially diminished or not after saba 3) if there is an activity that reproducibly causes the symptoms, try the saba 15 min before the activity on alternate days   If in fact the saba really does help, then fine to continue to use it prn but advised may need to look closer at the maintenance regimen being used to achieve better control of airways disease with exertion.    - The proper method of use, as well as anticipated side effects, of a metered-dose inhaler (dpi and hfa) were discussed and demonstrated to the patient using teach back method.           Each maintenance medication was reviewed in detail including emphasizing most importantly the difference between maintenance and prns and under what circumstances the prns are to be triggered using an action plan format where appropriate.  Total  time for H and P, chart review, counseling, reviewing hfa/ neb/dpi  device(s) and generating customized AVS unique to this office visit / same day charting = 33 min

## 2022-12-18 ENCOUNTER — Encounter (HOSPITAL_COMMUNITY): Payer: Medicare Other

## 2022-12-18 ENCOUNTER — Encounter (HOSPITAL_COMMUNITY)
Admission: RE | Admit: 2022-12-18 | Discharge: 2022-12-18 | Disposition: A | Discharge status: Home / Self Care | Payer: Medicare Other | Source: Ambulatory Visit | Attending: Family Medicine | Admitting: Family Medicine

## 2022-12-18 DIAGNOSIS — J449 Chronic obstructive pulmonary disease, unspecified: Secondary | ICD-10-CM

## 2022-12-18 DIAGNOSIS — J9611 Chronic respiratory failure with hypoxia: Secondary | ICD-10-CM | POA: Diagnosis not present

## 2022-12-18 NOTE — Progress Notes (Signed)
Daily Session Note  Patient Details  Name: Desiree Barber MRN: [id number] Date of Birth: Sep 03, 1950 Referring Provider:   Flowsheet Row PULMONARY REHAB OTHER RESP ORIENTATION from 08/22/2022 in [facility] CARDIAC REHABILITATION  Referring Provider Dr. Sherene Sires       Encounter Date: 12/18/2022  Check In:  Session Check In - 12/18/22 1445       Check-In   Supervising physician immediately available to respond to emergencies See telemetry face sheet for immediately available MD    Location [facility]    Staff Present Ross Ludwig, BS, Exercise Physiologist;Jaycee Pelzer Daphine Deutscher, RN, BSN;Phyllis Billingsley, RN    Virtual Visit No    Medication changes reported     Yes    Fall or balance concerns reported    No    Tobacco Cessation No Change    Warm-up and Cool-down Performed on first and last piece of equipment    Resistance Training Performed Yes    VAD Patient? No      Pain Assessment   Currently in Pain? No/denies             Capillary Blood Glucose: No results found for this or any previous visit (from the past 24 hour(s)).    Social History   Tobacco Use  Smoking Status Former   Current packs/day: 0.00   Average packs/day: 0.5 packs/day for 55.0 years (27.5 ttl pk-yrs)   Types: Cigarettes   Start date: 05/30/1966   Quit date: 05/29/2021   Years since quitting: 1.5   Passive exposure: Past  Smokeless Tobacco Former  Tobacco Comments   Quit date 05/29/2021    Goals Met:  Proper associated with RPD/PD & O2 Sat Independence with exercise equipment Using PLB without cueing & demonstrates good technique Exercise tolerated well Queuing for purse lip breathing No report of concerns or symptoms today Strength training completed today  Goals Unmet:  Not Applicable  Comments: Pt able to follow exercise prescription today without complaint.  Will continue to monitor for progression.    Dr. Erick Blinks is Medical Director for [facility] Pulmonary  Rehab.

## 2022-12-20 ENCOUNTER — Encounter (HOSPITAL_COMMUNITY): Payer: Medicare Other

## 2022-12-20 ENCOUNTER — Encounter (HOSPITAL_COMMUNITY)
Admission: RE | Admit: 2022-12-20 | Discharge: 2022-12-20 | Disposition: A | Discharge status: Home / Self Care | Payer: Medicare Other | Source: Ambulatory Visit | Attending: Family Medicine | Admitting: Family Medicine

## 2022-12-20 DIAGNOSIS — J9611 Chronic respiratory failure with hypoxia: Secondary | ICD-10-CM

## 2022-12-20 DIAGNOSIS — J449 Chronic obstructive pulmonary disease, unspecified: Secondary | ICD-10-CM | POA: Diagnosis not present

## 2022-12-20 NOTE — Progress Notes (Signed)
Daily Session Note  Patient Details  Name: Desiree Barber MRN: [id number] Date of Birth: 02/11/1951 Referring Provider:   Flowsheet Row PULMONARY REHAB OTHER RESP ORIENTATION from 08/22/2022 in [facility] CARDIAC REHABILITATION  Referring Provider Dr. Sherene Sires       Encounter Date: 12/20/2022  Check In:  Session Check In - 12/20/22 1415       Check-In   Supervising physician immediately available to respond to emergencies See telemetry face sheet for immediately available ER MD    Location [facility]    Staff Present Rodena Medin, RN, BSN;Heather Gaynell Face, Exercise Physiologist;Hillary Leonidas Romberg BSN, RN    Virtual Visit No    Medication changes reported     No    Fall or balance concerns reported    No    Warm-up and Cool-down Performed on first and last piece of equipment    Resistance Training Performed Yes    VAD Patient? No    PAD/SET Patient? No      Pain Assessment   Currently in Pain? No/denies    Pain Score 0-No pain    Multiple Pain Sites No             Capillary Blood Glucose: No results found for this or any previous visit (from the past 24 hour(s)).    Social History   Tobacco Use  Smoking Status Former   Current packs/day: 0.00   Average packs/day: 0.5 packs/day for 55.0 years (27.5 ttl pk-yrs)   Types: Cigarettes   Start date: 05/30/1966   Quit date: 05/29/2021   Years since quitting: 1.5   Passive exposure: Past  Smokeless Tobacco Former  Tobacco Comments   Quit date 05/29/2021    Goals Met:  Proper associated with RPD/PD & O2 Sat Improved SOB with ADL's Exercise tolerated well No report of concerns or symptoms today Strength training completed today  Goals Unmet:  Not Applicable  Comments: Pt able to follow exercise prescription today without complaint.  Will continue to monitor for progression.    Dr. Erick Blinks is Medical Director for [facility] Pulmonary Rehab.

## 2022-12-25 ENCOUNTER — Encounter (HOSPITAL_COMMUNITY): Payer: Medicare Other

## 2022-12-25 ENCOUNTER — Encounter: Payer: Self-pay | Admitting: Gastroenterology

## 2022-12-25 ENCOUNTER — Ambulatory Visit (INDEPENDENT_AMBULATORY_CARE_PROVIDER_SITE_OTHER): Payer: Medicare Other | Admitting: Gastroenterology

## 2022-12-25 ENCOUNTER — Encounter (HOSPITAL_COMMUNITY)
Admission: RE | Admit: 2022-12-25 | Discharge: 2022-12-25 | Disposition: A | Discharge status: Home / Self Care | Payer: Medicare Other | Source: Ambulatory Visit | Attending: Family Medicine | Admitting: Family Medicine

## 2022-12-25 VITALS — BP 113/57 | HR 90 | Temp 97.8°F | Ht 65.0 in | Wt 130.4 lb

## 2022-12-25 DIAGNOSIS — D509 Iron deficiency anemia, unspecified: Secondary | ICD-10-CM | POA: Diagnosis not present

## 2022-12-25 DIAGNOSIS — J9611 Chronic respiratory failure with hypoxia: Secondary | ICD-10-CM | POA: Diagnosis not present

## 2022-12-25 DIAGNOSIS — Z860100 Personal history of colon polyps, unspecified: Secondary | ICD-10-CM

## 2022-12-25 DIAGNOSIS — K2970 Gastritis, unspecified, without bleeding: Secondary | ICD-10-CM

## 2022-12-25 DIAGNOSIS — B9681 Helicobacter pylori [H. pylori] as the cause of diseases classified elsewhere: Secondary | ICD-10-CM | POA: Diagnosis not present

## 2022-12-25 DIAGNOSIS — K2270 Barrett's esophagus without dysplasia: Secondary | ICD-10-CM

## 2022-12-25 DIAGNOSIS — J449 Chronic obstructive pulmonary disease, unspecified: Secondary | ICD-10-CM | POA: Diagnosis not present

## 2022-12-25 NOTE — Patient Instructions (Addendum)
Please stop taking your pantoprazole for 2 weeks and then you will go to the lab to have breath test performed.  I am providing with lab slips today.  Please go on 10/16 or after to perform your H. pylori breath test and repeat your blood work.  After you complete the breath test you may resume your pantoprazole 40 mg once daily  2 locations for [facility] in [place]:              1. [address], [place]              2. [address] [place]   Diet/lifestyle recommendations:  Avoid fried, fatty, greasy, spicy, citrus foods. Avoid caffeine and carbonated beverages. Avoid chocolate. Try eating 4-6 small meals a day rather than 3 large meals. Do not eat within 3 hours of laying down. Prop head of bed up on wood or bricks to create a 6 inch incline. Avoid Advil, Aleve, ibuprofen, BC or Goody powders.  We will plan to follow-up in 1 year, sooner if needed.  It was a pleasure to see you today. I want to create trusting relationships with patients. If you receive a survey regarding your visit,  I greatly appreciate you taking time to fill this out on paper or through your MyChart. I value your feedback.  Brooke Bonito, MSN, FNP-BC, AGACNP-BC [facility]

## 2022-12-25 NOTE — Progress Notes (Signed)
Daily Session Note  Patient Details  Name: Desiree Barber MRN: [id number] Date of Birth: 1950/12/29 Referring Provider:   Flowsheet Row PULMONARY REHAB OTHER RESP ORIENTATION from 08/22/2022 in [facility] CARDIAC REHABILITATION  Referring Provider Dr. Sherene Sires       Encounter Date: 12/25/2022  Check In:  Session Check In - 12/25/22 1445       Check-In   Supervising physician immediately available to respond to emergencies See telemetry face sheet for immediately available MD    Staff Present Ross Ludwig, BS, Exercise Physiologist;Jessica Juanetta Gosling, MA, RCEP, CCRP, Harolyn Rutherford, RN, BSN    Virtual Visit No    Medication changes reported     No    Fall or balance concerns reported    No    Tobacco Cessation No Change    Warm-up and Cool-down Performed on first and last piece of equipment    Resistance Training Performed Yes    VAD Patient? No      Pain Assessment   Currently in Pain? No/denies             Capillary Blood Glucose: No results found for this or any previous visit (from the past 24 hour(s)).    Social History   Tobacco Use  Smoking Status Former   Current packs/day: 0.00   Average packs/day: 0.5 packs/day for 55.0 years (27.5 ttl pk-yrs)   Types: Cigarettes   Start date: 05/30/1966   Quit date: 05/29/2021   Years since quitting: 1.5   Passive exposure: Past  Smokeless Tobacco Former  Tobacco Comments   Quit date 05/29/2021    Goals Met:  Proper associated with RPD/PD & O2 Sat Independence with exercise equipment Using PLB without cueing & demonstrates good technique Exercise tolerated well Queuing for purse lip breathing No report of concerns or symptoms today Strength training completed today  Goals Unmet:  Not Applicable  Comments: Pt able to follow exercise prescription today without complaint.  Will continue to monitor for progression.    Dr. Erick Blinks is Medical Director for [facility] Pulmonary Rehab.

## 2022-12-25 NOTE — Progress Notes (Signed)
GI Office Note    Referring Provider: Assunta Found, MD Primary Care Physician:  Assunta Found, MD Primary Gastroenterologist: Hennie Duos. Marletta Lor, DO  Date:  12/25/2022  ID:  Desiree Barber, DOB 01/05/1951, MRN [id number]   Chief Complaint   Chief Complaint  Patient presents with   Follow-up    Follow up. No problems    History of Present Illness  Desiree Barber is a 72 y.o. female with a history of anxiety, COPD, HTN, and IDA presenting today for follow-up post EGD and colonoscopy.  Colonoscopy in March 2017: -1 tubular adenoma removed -Pancolonic diverticulosis -Advised repeat in 7 years.   Office visit 02/28/2022.  Patient referred for IDA.  Denied any stools.  Had recently stopped taking Goody powders which she felt like may be caused her bleeding.  Referral received from hematology oncology.Marland Kitchen  She was started on iron supplementation in November.  She denies any melena or hematochezia.  Records were heme positive.  Proper use 2 months prior.  Denied any NSAIDs history for chest pain, dysphagia, family history of colorectal cancer.  She is scheduled for EGD and colonoscopy for evaluation of her anemia.  On iron supplement.   Patient had to reschedule procedures given vascular procedures performed.  She underwent peripheral vascular catheterization 04/10/2022.  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    Labs 05/25/2022: Hemoglobin 9.3 down from 12.2 on day of catheterization.  Normocytic indices.  Platelets 464.  Ferritin 16, iron 17, iron saturation 4%  Last office visit 06/13/22. She reports she was taken off Goody powders given previous blood in her stools and was currently taking Plavix.  Reportedly not feeling bad and she had recently stopped taking oral iron given she was about to start iron infusions.   She denied any melena or BRBPR.  Reported a history of low iron during childbirth times.  Reported good appetite since she quit smoking and had gained about 25 pounds and maintaining.  Stopped Crestor a few months prior.  Advised to schedule EGD and colonoscopy for further evaluation of IDA.  Encouraged to continue PPI once daily.   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 Pylori -Esophageal biopsies consistent with Barrett's -Advised repeat EGD in 5 years for surveillance  Colonoscopy 08/27/22: -Nonbleeding internal hemorrhoids -Pancolonic diverticulosis -Advised repeat in 10 years for screening  Given positive H. pylori on pathology she was treated with bismuth quadruple therapy with PPI briefly increased to twice daily.     Latest Ref Rng & Units 08/28/2022    9:08 AM 07/11/2022    8:09 AM 05/25/2022    9:48 AM  CBC  WBC 4.0 - 10.5 K/uL 12.7  6.8  8.6   Hemoglobin 12.0 - 15.0 g/dL 62.1  30.8  9.3   Hematocrit 36.0 - 46.0 % 36.2  34.3  32.3   Platelets 150 - 400 K/uL 405  401  464    Today: Not currently having any issues.  Denies any melena, BRBPR, constipation, diarrhea, abdominal pain, nausea, vomiting, dysphagia, early satiety, or lack of appetite.  No weight loss or weight gain.  Has had some intermittent acid reflux.  Trying to drop a few lbs and admits to eating more tomatoes but symptoms are not frequent and only occurred once.  Has appointment with her vascular doctor upcoming. Still having fatigue in her legs.   Current Outpatient Medications  Medication Sig Dispense Refill   albuterol (PROVENTIL) (2.5 MG/3ML) 0.083% nebulizer solution Up to every 4 hours as needed 75 mL 12   albuterol (VENTOLIN HFA) 108 (90 Base) MCG/ACT inhaler Inhale into the  lungs.     ALPRAZolam (XANAX) 0.25 MG tablet Take 0.25 mg by mouth daily as needed for anxiety or sleep.     Ascorbic Acid (VITAMIN C) 100 MG tablet Take 100 mg by mouth daily.     clopidogrel (PLAVIX) 75 MG tablet Take 1 tablet (75 mg total) by mouth daily. 30 tablet 11   pantoprazole (PROTONIX) 40 MG tablet Take 1 tablet (40 mg total) by mouth 2 (two) times daily. 60 tablet 11   umeclidinium-vilanterol (ANORO ELLIPTA) 62.5-25 MCG/ACT AEPB Inhale 1 puff into the lungs daily. 1 each 11   vitamin B-12 (CYANOCOBALAMIN) 100 MCG tablet Take 100 mcg by mouth daily.     VITAMIN D, CHOLECALCIFEROL, PO Take 1 tablet by mouth daily. One daily     losartan (COZAAR) 25 MG tablet Take 1 tablet (25 mg total) by mouth daily. (Patient not taking: Reported on 12/25/2022) 90 tablet 2   No current facility-administered medications for this visit.    Past Medical History:  Diagnosis Date   Anemia    Anxiety    Occasional   Arthritis    COPD (chronic obstructive pulmonary disease) (HCC)    HTN (hypertension)    Iron deficiency anemia due to chronic blood loss 03/28/2022    Past Surgical History:  Procedure Laterality Date   ABDOMINAL AORTOGRAM W/LOWER EXTREMITY N/A 04/10/2022   Procedure: ABDOMINAL AORTOGRAM W/LOWER EXTREMITY;  Surgeon: Nada Libman, MD;  Location: [facility];  Service: Cardiovascular;  Laterality: N/A;   BIOPSY  08/27/2022   Procedure: BIOPSY;  Surgeon: Lanelle Bal, DO;  Location: [facility];  Service: Endoscopy;;   COLONOSCOPY  02/23/04   RMR: normal rectum and colon.minimal internal hemorrhoids   COLONOSCOPY WITH PROPOFOL N/A 06/13/2015   Procedure: COLONOSCOPY WITH PROPOFOL;  Surgeon: Corbin Ade, MD;  Location: [facility];  Service: Endoscopy;  Laterality: N/A;  1045   COLONOSCOPY WITH PROPOFOL N/A 08/27/2022   Procedure: COLONOSCOPY WITH PROPOFOL;  Surgeon: Lanelle Bal, DO;  Location: [facility];  Service: Endoscopy;  Laterality: N/A;  8:45 am, asa 3    ESOPHAGOGASTRODUODENOSCOPY (EGD) WITH PROPOFOL N/A 08/27/2022   Procedure: ESOPHAGOGASTRODUODENOSCOPY (EGD) WITH PROPOFOL;  Surgeon: Lanelle Bal, DO;  Location: [facility];  Service: Endoscopy;  Laterality: N/A;   HOT HEMOSTASIS  08/27/2022   Procedure: HOT HEMOSTASIS (ARGON PLASMA COAGULATION/BICAP);  Surgeon: Lanelle Bal, DO;  Location: [facility];  Service: Endoscopy;;   POLYPECTOMY  06/13/2015   Procedure: POLYPECTOMY;  Surgeon: Corbin Ade, MD;  Location: [facility];  Service: Endoscopy;;  sigmoid colon polyp   TOOTH EXTRACTION     TRIGGER FINGER RELEASE Bilateral    TUBAL LIGATION      Family History  Problem Relation Age of Onset   Colon cancer Neg Hx    Breast cancer Neg Hx     Allergies as of 12/25/2022 - Review Complete 12/25/2022  Allergen Reaction Noted   Demerol [meperidine] Nausea And Vomiting 05/03/2015   Oxycontin [oxycodone hcl]  Nausea And Vomiting 05/03/2015   Fish allergy Nausea Only 06/13/2015   Lavender oil Itching 02/28/2022   Other Itching 02/28/2022    Social History   Socioeconomic History   Marital status: Married    Spouse name: Not on file   Number of children: Not on file   Years of education: Not on file   Highest education level: Not on file  Occupational History   Not on file  Tobacco Use   Smoking status: Former    Current packs/day: 0.00    Average packs/day: 0.5 packs/day for 55.0 years (27.5 ttl pk-yrs)    Types: Cigarettes    Start date: 05/30/1966    Quit date: 05/29/2021    Years since quitting: 1.5    Passive exposure: Past   Smokeless tobacco: Former   Tobacco comments:    Quit date 05/29/2021  Vaping Use   Vaping status: Never Used  Substance and Sexual Activity   Alcohol use: Yes    Alcohol/week: 0.0 standard drinks of alcohol    Comment: 3 beers a day   Drug use: No   Sexual activity: Yes    Birth control/protection: Surgical  Other Topics Concern   Not on file  Social History Narrative   Not on  file   Social Determinants of Health   Financial Resource Strain: Not on file  Food Insecurity: Unknown (12/24/2021)   Hunger Vital Sign    Worried About Running Out of Food in the Last Year: Never true    Ran Out of Food in the Last Year: Not on file  Transportation Needs: No Transportation Needs (12/24/2021)   PRAPARE - Administrator, Civil Service (Medical): No    Lack of Transportation (Non-Medical): No  Physical Activity: Not on file  Stress: Not on file  Social Connections: Unknown (08/07/2021)   Received [facility], [facility]   Social Network    Social Network: Not on file     Review of Systems   Gen: Denies fever, chills, anorexia. Denies fatigue, weakness, weight loss.  CV: Denies chest pain, palpitations, syncope, peripheral edema, and claudication. Resp: Denies dyspnea at rest, cough, wheezing, coughing up blood, and pleurisy. GI: See HPI Derm: Denies rash, itching, dry skin Psych: Denies depression, anxiety, memory loss, confusion. No homicidal or suicidal ideation.  Heme: Denies bruising, bleeding, and enlarged lymph nodes.   Physical Exam   BP (!) 113/57 (BP Location: Left Arm, Patient Position: Sitting, Cuff Size: Normal)   Pulse 90   Temp 97.8 F (36.6 C) (Temporal)   Ht 5\' 5"  (1.651 m)   Wt 130 lb 6.4 oz (59.1 kg)   BMI 21.70 kg/m   General:   Alert and oriented. No distress noted. Pleasant and cooperative.  Head:  Normocephalic and atraumatic. Eyes:  Conjuctiva clear without scleral icterus. Mouth:  Oral mucosa pink and moist. Good dentition. No lesions. Lungs:  Clear to auscultation bilaterally. No wheezes, rales, or rhonchi. No distress.  Heart:  S1, S2 present without murmurs appreciated.  Abdomen:  +BS, soft, non-tender and non-distended, slightly rounded.  No rebound or guarding. No HSM or masses noted. Rectal: deferred Msk:  Symmetrical without gross deformities. Normal posture. Extremities:  Without edema. Neurologic:   Alert and  oriented x4 Psych:  Alert and cooperative. Normal mood and affect.   Assessment  Desiree Barber is a 72 y.o. female with a history of anxiety, COPD, HTN, and IDA presenting today for follow-up post EGD and colonoscopy.  IDA, history of colon polyps: Previous history of chronic Goody powder use with heme positive stool, quit in October 2023.  Recently received iron infusions given no improvement with oral iron.  Hemoglobin improved from 9.3-10.7 as of June.  EGD and colonoscopy recently performed.  EGD with evidence of Barrett's esophagus, a few angiodysplastic lesions treated with APC therapy and H. pylori gastritis which is likely cause of anemia.  Given it has been 4 months we will repeat CBC and iron panel to determine if we can resume oral iron therapy or pursue additional iron infusions as needed.  H. pylori gastritis: Recent EGD with evidence of Barrett's esophagus and gastritis with pathology confirming H. pylori.  Treated with bismuth quadruple therapy.  In need of testing to document eradication.  Will stop PPI for 2 weeks and perform breath testing.  She will then resume PPI once daily  Barrett's esophagus: EGD with short segment Barrett's esophagus with pathology confirmation.  Has been on daily PPI and advised she will need to continue this.  Will be in need of repeat EGD in 5 years for surveillance.  PLAN   Continue pantoprazole 40 mg once daily Stop PPI for 2 weeks and then perform H. pylori breath test. (10/16 or after) Repeat CBC and iron panel Avoid NSAIDs GERD diet Repeat EGD in 2029 Repeat colonoscopy in 2034 Follow-up in 1 year, sooner if needed.     Brooke Bonito, MSN, FNP-BC, AGACNP-BC [facility]

## 2022-12-27 ENCOUNTER — Encounter (HOSPITAL_COMMUNITY)
Admission: RE | Admit: 2022-12-27 | Discharge: 2022-12-27 | Disposition: A | Discharge status: Home / Self Care | Payer: Medicare Other | Source: Ambulatory Visit | Attending: Family Medicine | Admitting: Family Medicine

## 2022-12-27 ENCOUNTER — Encounter (HOSPITAL_COMMUNITY): Payer: Medicare Other

## 2022-12-27 DIAGNOSIS — J449 Chronic obstructive pulmonary disease, unspecified: Secondary | ICD-10-CM | POA: Diagnosis not present

## 2022-12-27 DIAGNOSIS — J9611 Chronic respiratory failure with hypoxia: Secondary | ICD-10-CM

## 2022-12-27 NOTE — Progress Notes (Signed)
Daily Session Note  Patient Details  Name: Desiree Barber MRN: [id number] Date of Birth: [birth year] Referring Provider:   Flowsheet Row PULMONARY REHAB OTHER RESP ORIENTATION from 08/22/2022 in [facility] CARDIAC RE[facility]  Referring Provider Dr. Sherene Sires       Encounter Date: 12/27/2022  Check In:  Session Check In - 12/27/22 1430       Check-In   Supervising physician immediately available to respond to emergencies See telemetry face sheet for immediately available ER MD    Location [facility]    Staff Present Rodena Medin, RN, Pleas Koch, RN, BSN;Heather Fredric Mare, BS, Exercise Physiologist    Virtual Visit No    Medication changes reported     No    Fall or balance concerns reported    No    Warm-up and Cool-down Performed on first and last piece of equipment    Resistance Training Performed Yes    VAD Patient? No    PAD/SET Patient? No      Pain Assessment   Currently in Pain? No/denies    Pain Score 0-No pain    Multiple Pain Sites No             Capillary Blood Glucose: No results found for this or any previous visit (from the past 24 hour(s)).    Social History   Tobacco Use  Smoking Status Former   Current packs/day: 0.00   Average packs/day: 0.5 packs/day for 55.0 years (27.5 ttl pk-yrs)   Types: Cigarettes   Start date: 05/30/1966   Quit date: 05/29/2021   Years since quitting: 1.5   Passive exposure: Past  Smokeless Tobacco Former  Tobacco Comments   Quit date 05/29/2021    Goals Met:  Proper associated with RPD/PD & O2 Sat Independence with exercise equipment Using PLB without cueing & demonstrates good technique Exercise tolerated well No report of concerns or symptoms today Strength training completed today  Goals Unmet:  Not Applicable  Comments: Pt able to follow exercise prescription today without complaint.  Will continue to monitor for progression.    Dr. Erick Blinks is Medical Director for [facility].

## 2022-12-31 DIAGNOSIS — Z0001 Encounter for general adult medical examination with abnormal findings: Secondary | ICD-10-CM | POA: Diagnosis not present

## 2022-12-31 DIAGNOSIS — Z1331 Encounter for screening for depression: Secondary | ICD-10-CM | POA: Diagnosis not present

## 2022-12-31 DIAGNOSIS — I10 Essential (primary) hypertension: Secondary | ICD-10-CM | POA: Diagnosis not present

## 2022-12-31 DIAGNOSIS — J449 Chronic obstructive pulmonary disease, unspecified: Secondary | ICD-10-CM | POA: Diagnosis not present

## 2022-12-31 DIAGNOSIS — Z6822 Body mass index (BMI) 22.0-22.9, adult: Secondary | ICD-10-CM | POA: Diagnosis not present

## 2022-12-31 DIAGNOSIS — F419 Anxiety disorder, unspecified: Secondary | ICD-10-CM | POA: Diagnosis not present

## 2022-12-31 DIAGNOSIS — I739 Peripheral vascular disease, unspecified: Secondary | ICD-10-CM | POA: Diagnosis not present

## 2023-01-01 ENCOUNTER — Encounter (HOSPITAL_COMMUNITY)
Admission: RE | Admit: 2023-01-01 | Discharge: 2023-01-01 | Disposition: A | Discharge status: Home / Self Care | Payer: Medicare Other | Source: Ambulatory Visit | Attending: Family Medicine | Admitting: Family Medicine

## 2023-01-01 DIAGNOSIS — J449 Chronic obstructive pulmonary disease, unspecified: Secondary | ICD-10-CM | POA: Diagnosis not present

## 2023-01-01 DIAGNOSIS — J9611 Chronic respiratory failure with hypoxia: Secondary | ICD-10-CM | POA: Diagnosis not present

## 2023-01-01 NOTE — Progress Notes (Signed)
Daily Session Note  Patient Details  Name: Desiree Barber MRN: [id number] Date of Birth: [birth year] Referring Provider:   Flowsheet Row PULMONARY REHAB OTHER RESP ORIENTATION from 08/22/2022 in [facility] CARDIAC REHAB[facility]  Referring Provider Dr. Sherene Sires       Encounter Date: 01/01/2023  Check In:  Session Check In - 01/01/23 1430       Check-In   Supervising physician immediately available to respond to emergencies See telemetry face sheet for immediately available MD    Location [facility]    Staff Present Ross Ludwig, BS, Exercise Physiologist;Phyllis Billingsley, RN;Daphyne Daphine Deutscher, RN, BSN    Virtual Visit No    Medication changes reported     No    Tobacco Cessation No Change    Warm-up and Cool-down Performed on first and last piece of equipment    PAD/SET Patient? No      Pain Assessment   Currently in Pain? No/denies    Pain Score 0-No pain    Multiple Pain Sites No             Capillary Blood Glucose: No results found for this or any previous visit (from the past 24 hour(s)).    Social History   Tobacco Use  Smoking Status Former   Current packs/day: 0.00   Average packs/day: 0.5 packs/day for 55.0 years (27.5 ttl pk-yrs)   Types: Cigarettes   Start date: 05/30/1966   Quit date: 05/29/2021   Years since quitting: 1.5   Passive exposure: Past  Smokeless Tobacco Former  Tobacco Comments   Quit date 05/29/2021    Goals Met:  Independence with exercise equipment Exercise tolerated well No report of concerns or symptoms today Strength training completed today  Goals Unmet:  Not Applicable  Comments: Pt able to follow exercise prescription today without complaint.  Will continue to monitor for progression.    Dr. Erick Blinks is Medical Director for [facility] Pulmonary Re[facility].

## 2023-01-02 ENCOUNTER — Encounter (HOSPITAL_COMMUNITY): Payer: Self-pay | Admitting: *Deleted

## 2023-01-02 DIAGNOSIS — J449 Chronic obstructive pulmonary disease, unspecified: Secondary | ICD-10-CM

## 2023-01-02 DIAGNOSIS — J9611 Chronic respiratory failure with hypoxia: Secondary | ICD-10-CM

## 2023-01-02 NOTE — Progress Notes (Signed)
Pulmonary Individual Treatment Plan  Patient Details  Name: Desiree Barber MRN: [id number] Date of Birth: [birth year] Referring Provider:   Flowsheet Row PULMONARY REHAB OTHER RESP ORIENTATION from 08/22/2022 in [facility] CARDIAC REHABILITATION  Referring Provider Dr. Sherene Sires       Initial Encounter Date:  Flowsheet Row PULMONARY REHAB OTHER RESP ORIENTATION from 08/22/2022 in [facility]  Date 08/22/22       Visit Diagnosis: Chronic respiratory failure with hypoxia (HCC)  COPD mixed type (HCC)  Patient's Home Medications on Admission:   Current Outpatient Medications:    albuterol (PROVENTIL) (2.5 MG/3ML) 0.083% nebulizer solution, Up to every 4 hours as needed, Disp: 75 mL, Rfl: 12   albuterol (VENTOLIN HFA) 108 (90 Base) MCG/ACT inhaler, Inhale into the lungs., Disp: , Rfl:    ALPRAZolam (XANAX) 0.25 MG tablet, Take 0.25 mg by mouth daily as needed for anxiety or sleep., Disp: , Rfl:    Ascorbic Acid (VITAMIN C) 100 MG tablet, Take 100 mg by mouth daily., Disp: , Rfl:    clopidogrel (PLAVIX) 75 MG tablet, Take 1 tablet (75 mg total) by mouth daily., Disp: 30 tablet, Rfl: 11   losartan (COZAAR) 25 MG tablet, Take 1 tablet (25 mg total) by mouth daily. (Patient not taking: Reported on 12/25/2022), Disp: 90 tablet, Rfl: 2   pantoprazole (PROTONIX) 40 MG tablet, Take 1 tablet (40 mg total) by mouth 2 (two) times daily., Disp: 60 tablet, Rfl: 11   umeclidinium-vilanterol (ANORO ELLIPTA) 62.5-25 MCG/ACT AEPB, Inhale 1 puff into the lungs daily., Disp: 1 each, Rfl: 11   vitamin B-12 (CYANOCOBALAMIN) 100 MCG tablet, Take 100 mcg by mouth daily., Disp: , Rfl:    VITAMIN D, CHOLECALCIFEROL, PO, Take 1 tablet by mouth daily. One daily, Disp: , Rfl:   Past Medical History: Past Medical History:  Diagnosis Date   Anemia    Anxiety    Occasional   Arthritis    COPD (chronic obstructive pulmonary disease) (HCC)    HTN (hypertension)    Iron deficiency anemia due to  chronic blood loss 03/28/2022    Tobacco Use: Social History   Tobacco Use  Smoking Status Former   Current packs/day: 0.00   Average packs/day: 0.5 packs/day for 55.0 years (27.5 ttl pk-yrs)   Types: Cigarettes   Start date: 05/30/1966   Quit date: 05/29/2021   Years since quitting: 1.5   Passive exposure: Past  Smokeless Tobacco Former  Tobacco Comments   Quit date 05/29/2021    Labs: Review Flowsheet       Latest Ref Rng & Units 03/01/2022 04/10/2022  Labs for ITP Cardiac and Pulmonary Rehab  Cholestrol 0 - 200 mg/dL 093  -  LDL (calc) 0 - 99 mg/dL 65  -  HDL-C >26 mg/dL 77  -  Trlycerides <712 mg/dL 38  -  TCO2 22 - 32 mmol/L - 28     Details            Capillary Blood Glucose: No results found for: "GLUCAP"   Pulmonary Assessment Scores:  Pulmonary Assessment Scores     Row Name 08/22/22 1314         ADL UCSD   SOB Score total 34     Rest 0     Walk 3     Stairs 4     Bath 0     Dress 0     Shop 2       CAT Score   CAT  Score 15       mMRC Score   mMRC Score 1             UCSD: Self-administered rating of dyspnea associated with activities of daily living (ADLs) 6-point scale (0 = "not at all" to 5 = "maximal or unable to do because of breathlessness")  Scoring Scores range from 0 to 120.  Minimally important difference is 5 units  CAT: CAT can identify the health impairment of COPD patients and is better correlated with disease progression.  CAT has a scoring range of zero to 40. The CAT score is classified into four groups of low (less than 10), medium (10 - 20), high (21-30) and very high (31-40) based on the impact level of disease on health status. A CAT score over 10 suggests significant symptoms.  A worsening CAT score could be explained by an exacerbation, poor medication adherence, poor inhaler technique, or progression of COPD or comorbid conditions.  CAT MCID is 2 points  mMRC: mMRC (Modified Medical Research Council) Dyspnea  Scale is used to assess the degree of baseline functional disability in patients of respiratory disease due to dyspnea. No minimal important difference is established. A decrease in score of 1 point or greater is considered a positive change.   Pulmonary Function Assessment:   Exercise Target Goals: Exercise Program Goal: Individual exercise prescription set using results from initial 6 min walk test and THRR while considering  patient's activity barriers and safety.   Exercise Prescription Goal: Initial exercise prescription builds to 30-45 minutes a day of aerobic activity, 2-3 days per week.  Home exercise guidelines will be given to patient during program as part of exercise prescription that the participant will acknowledge.  Activity Barriers & Risk Stratification:  Activity Barriers & Cardiac Risk Stratification - 08/22/22 1322       Activity Barriers & Cardiac Risk Stratification   Activity Barriers Arthritis;Back Problems;Neck/Spine Problems;Joint Problems;Deconditioning;Muscular Weakness;Shortness of Breath    Cardiac Risk Stratification High             6 Minute Walk:  6 Minute Walk     Row Name 08/22/22 1432         6 Minute Walk   Phase Initial     Distance 800 feet     Walk Time 6 minutes     # of Rest Breaks 1     MPH 1.5     METS 2.38     RPE 13     Perceived Dyspnea  13     VO2 Peak 8.33     Symptoms Yes (comment)     Comments pt needed one sittting break due to SOB     Resting HR 81 bpm     Resting BP 134/60     Resting Oxygen Saturation  92 %     Exercise Oxygen Saturation  during 6 min walk 80 %     Max Ex. HR 109 bpm     Max Ex. BP 140/68     2 Minute Post BP 136/60       Interval HR   1 Minute HR 93     2 Minute HR 104     3 Minute HR 103     4 Minute HR 109     5 Minute HR 103     6 Minute HR 105     2 Minute Post HR 91     Interval Heart Rate? Yes  Interval Oxygen   Interval Oxygen? Yes     Baseline Oxygen Saturation % 92  %     1 Minute Oxygen Saturation % 90 %     1 Minute Liters of Oxygen 0 L     2 Minute Oxygen Saturation % 85 %     2 Minute Liters of Oxygen 0 L     3 Minute Oxygen Saturation % 88 %     3 Minute Liters of Oxygen 0 L     4 Minute Oxygen Saturation % 83 %     5 Minute Oxygen Saturation % 81 %     6 Minute Oxygen Saturation % 80 %     6 Minute Liters of Oxygen 0 L     2 Minute Post Oxygen Saturation % 92 %     2 Minute Post Liters of Oxygen 0 L              Oxygen Initial Assessment:  Oxygen Initial Assessment - 08/22/22 1437       Initial 6 min Walk   Oxygen Used None      Program Oxygen Prescription   Program Oxygen Prescription None   pt did not have accuret reading due to nail polish, will be using forhead sensor if nail polish isnt removed            Oxygen Re-Evaluation:  Oxygen Re-Evaluation     Row Name 10/09/22 1544 10/18/22 1522 10/26/22 1239 11/22/22 1510 12/13/22 1539     Program Oxygen Prescription   Program Oxygen Prescription None None -- None None     Home Oxygen   Home Oxygen Device Portable Concentrator Portable Concentrator -- Portable Concentrator Portable Concentrator   Sleep Oxygen Prescription None None -- None None   Home Exercise Oxygen Prescription Pulsed Pulsed -- None None   Liters per minute 2 2 -- -- --   Home Resting Oxygen Prescription None None -- Pulsed  Pt wears her 02 15-20 min a day when she is on her computer Pulsed   Liters per minute -- -- -- 2 --   Compliance with Home Oxygen Use -- Yes -- Yes Yes     Goals/Expected Outcomes   Short Term Goals To learn and exhibit compliance with exercise, home and travel O2 prescription;To learn and understand importance of monitoring SPO2 with pulse oximeter and demonstrate accurate use of the pulse oximeter.;To learn and understand importance of maintaining oxygen saturations>88%;To learn and demonstrate proper pursed lip breathing techniques or other breathing techniques. ;To learn and  demonstrate proper use of respiratory medications To learn and exhibit compliance with exercise, home and travel O2 prescription;To learn and understand importance of monitoring SPO2 with pulse oximeter and demonstrate accurate use of the pulse oximeter.;To learn and understand importance of maintaining oxygen saturations>88%;To learn and demonstrate proper pursed lip breathing techniques or other breathing techniques. ;To learn and demonstrate proper use of respiratory medications To learn and demonstrate proper pursed lip breathing techniques or other breathing techniques.  To learn and exhibit compliance with exercise, home and travel O2 prescription;To learn and understand importance of monitoring SPO2 with pulse oximeter and demonstrate accurate use of the pulse oximeter.;To learn and understand importance of maintaining oxygen saturations>88%;To learn and demonstrate proper pursed lip breathing techniques or other breathing techniques. ;To learn and demonstrate proper use of respiratory medications To learn and exhibit compliance with exercise, home and travel O2 prescription;To learn and understand importance of monitoring SPO2 with pulse oximeter and  demonstrate accurate use of the pulse oximeter.;To learn and understand importance of maintaining oxygen saturations>88%;To learn and demonstrate proper pursed lip breathing techniques or other breathing techniques. ;To learn and demonstrate proper use of respiratory medications   Long  Term Goals Compliance with respiratory medication;Exhibits proper breathing techniques, such as pursed lip breathing or other method taught during program session;Maintenance of O2 saturations>88%;Verbalizes importance of monitoring SPO2 with pulse oximeter and return demonstration;Exhibits compliance with exercise, home  and travel O2 prescription Compliance with respiratory medication;Exhibits proper breathing techniques, such as pursed lip breathing or other method taught  during program session;Maintenance of O2 saturations>88%;Verbalizes importance of monitoring SPO2 with pulse oximeter and return demonstration;Exhibits compliance with exercise, home  and travel O2 prescription Exhibits proper breathing techniques, such as pursed lip breathing or other method taught during program session Exhibits compliance with exercise, home  and travel O2 prescription;Verbalizes importance of monitoring SPO2 with pulse oximeter and return demonstration;Maintenance of O2 saturations>88%;Exhibits proper breathing techniques, such as pursed lip breathing or other method taught during program session;Compliance with respiratory medication;Demonstrates proper use of MDI's Exhibits compliance with exercise, home  and travel O2 prescription;Verbalizes importance of monitoring SPO2 with pulse oximeter and return demonstration;Maintenance of O2 saturations>88%;Exhibits proper breathing techniques, such as pursed lip breathing or other method taught during program session;Compliance with respiratory medication;Demonstrates proper use of MDI's   Comments -- Micah Flesher to Dr to make sure conentrator was good and aproval to take on airplane for trip to [place]. Asked her about her pursed lip breathing when needed ans she stated that she didnt use it but also sounded that she did not fully understand. Her O2 states are maintaning above 88% and knows how to take her SpO2 with portable pulse ox. Diaphragmatic and PLB breathing explained and performed with patient. Patient has a better understanding of how to do these exercises to help with breathing performance and relaxation. Patient performed breathing techniques adequately and to practice further at home. Pt does not wear 02 during exercise.  She states that her MD told her to wear 2L 02 at home while she is resting for 15-20 min a day.  She has a pulse ox at home and understands that her oxygen level needs to be above 88%.  She has an albuterol inhaler as needed  but has not had to use it. Desiree Barber does not wear oxygen during exercise at rehab and her oxygen stats have been WNL. She does have oxygen at home but does not need to wear it at home. She stated that she will wear it when she feels very SOB. She has albuterol inhaler and does use it as needed.   Goals/Expected Outcomes -- short term goal: reminders about PLB   Long term goal: continue to monitor stats at home and on trip. Short: practice PLB and diaphragmatic breathing at home. Long: Use PLB and diaphragmatic breathing independently Short term:  Continue to wear her oxygen as prescribed and practice PLB   Long term:  Use albuterol as needed and continue PLB at home Short term:  Continue to wear her oxygen as prescribed and practice PLB   Long term:  Use albuterol as needed and continue PLB at home            Oxygen Discharge (Final Oxygen Re-Evaluation):  Oxygen Re-Evaluation - 12/13/22 1539       Program Oxygen Prescription   Program Oxygen Prescription None      Home Oxygen   Home Oxygen Device Portable Concentrator  Sleep Oxygen Prescription None    Home Exercise Oxygen Prescription None    Home Resting Oxygen Prescription Pulsed    Compliance with Home Oxygen Use Yes      Goals/Expected Outcomes   Short Term Goals To learn and exhibit compliance with exercise, home and travel O2 prescription;To learn and understand importance of monitoring SPO2 with pulse oximeter and demonstrate accurate use of the pulse oximeter.;To learn and understand importance of maintaining oxygen saturations>88%;To learn and demonstrate proper pursed lip breathing techniques or other breathing techniques. ;To learn and demonstrate proper use of respiratory medications    Long  Term Goals Exhibits compliance with exercise, home  and travel O2 prescription;Verbalizes importance of monitoring SPO2 with pulse oximeter and return demonstration;Maintenance of O2 saturations>88%;Exhibits proper breathing techniques,  such as pursed lip breathing or other method taught during program session;Compliance with respiratory medication;Demonstrates proper use of MDI's    Comments Desiree Barber does not wear oxygen during exercise at rehab and her oxygen stats have been WNL. She does have oxygen at home but does not need to wear it at home. She stated that she will wear it when she feels very SOB. She has albuterol inhaler and does use it as needed.    Goals/Expected Outcomes Short term:  Continue to wear her oxygen as prescribed and practice PLB   Long term:  Use albuterol as needed and continue PLB at home             Initial Exercise Prescription:  Initial Exercise Prescription - 08/22/22 1400       Date of Initial Exercise RX and Referring Provider   Date 08/22/22    Referring Provider Dr. Sherene Sires    Expected Discharge Date 01/03/23      Treadmill   MPH 1.3    Grade 0    Minutes 17      NuStep   Level 1    SPM 60    Minutes 22      Prescription Details   Frequency (times per week) 2    Duration Progress to 30 minutes of continuous aerobic without signs/symptoms of physical distress      Intensity   THRR 40-80% of Max Heartrate 60-119    Ratings of Perceived Exertion 11-13    Perceived Dyspnea 0-4      Resistance Training   Training Prescription Yes    Weight 3    Reps 10-15             Perform Capillary Blood Glucose checks as needed.  Exercise Prescription Changes:   Exercise Prescription Changes     Row Name 08/28/22 1500 09/11/22 1500 09/25/22 1500 10/02/22 1500 10/23/22 1500     Response to Exercise   Blood Pressure (Admit) 112/58 120/60 120/60 112/62 130/60   Blood Pressure (Exercise) 126/60 110/58 130/60 124/74 130/60   Blood Pressure (Exit) 110/60 120/68 142/62 116/50 126/60   Heart Rate (Admit) 83 bpm 78 bpm 80 bpm 87 bpm 89 bpm   Heart Rate (Exercise) 86 bpm 95 bpm 100 bpm 96 bpm 100 bpm   Heart Rate (Exit) 85 bpm 77 bpm 85 bpm 81 bpm 90 bpm   Oxygen Saturation (Admit)  94 % 95 % 94 % 92 % 89 %   Oxygen Saturation (Exercise) 92 % 95 % 93 % 90 % 92 %   Oxygen Saturation (Exit) 95 % 96 % 94 % 94 % 93 %   Rating of Perceived Exertion (Exercise) 12 12 12 12  13  Perceived Dyspnea (Exercise) 12 12 12  0 1   Duration Continue with 30 min of aerobic exercise without signs/symptoms of physical distress. Continue with 30 min of aerobic exercise without signs/symptoms of physical distress. Continue with 30 min of aerobic exercise without signs/symptoms of physical distress. Continue with 30 min of aerobic exercise without signs/symptoms of physical distress. Continue with 30 min of aerobic exercise without signs/symptoms of physical distress.   Intensity THRR unchanged THRR unchanged THRR unchanged THRR unchanged THRR unchanged     Progression   Progression Continue to progress workloads to maintain intensity without signs/symptoms of physical distress. Continue to progress workloads to maintain intensity without signs/symptoms of physical distress. Continue to progress workloads to maintain intensity without signs/symptoms of physical distress. Continue to progress workloads to maintain intensity without signs/symptoms of physical distress. Continue to progress workloads to maintain intensity without signs/symptoms of physical distress.     Resistance Training   Training Prescription Yes Yes Yes Yes Yes   Weight 2 2 3 3 4    Reps 10-15 10-15 10-15 10-15 10-15   Time 10 Minutes 10 Minutes 10 Minutes -- --     Treadmill   MPH 1.3 1.5 1.6 1.5 1.5   Grade 0 0 0 0 0   Minutes 17 17 17 15 15    METs 2 2.15 2.23 2.15 2.15     NuStep   Level 1 2 2 2 2    SPM 40 79 67 55 82   Minutes 22 22 22 15 15    METs 1.7 1.9 1.8 1.6 1.9     Oxygen   Maintain Oxygen Saturation -- -- -- 88% or higher 88% or higher    Row Name 11/22/22 1500 12/25/22 1300           Response to Exercise   Blood Pressure (Admit) 118/64 104/64      Blood Pressure (Exit) 112/60 120/60      Heart Rate  (Admit) 90 bpm 95 bpm      Heart Rate (Exercise) 116 bpm 88 bpm      Heart Rate (Exit) 88 bpm 78 bpm      Oxygen Saturation (Admit) 90 % 95 %      Oxygen Saturation (Exercise) 92 % 94 %      Oxygen Saturation (Exit) 95 % 96 %      Rating of Perceived Exertion (Exercise) 12 12      Perceived Dyspnea (Exercise) 1 2      Duration Continue with 30 min of aerobic exercise without signs/symptoms of physical distress. Continue with 30 min of aerobic exercise without signs/symptoms of physical distress.      Intensity THRR unchanged THRR unchanged        Progression   Progression Continue to progress workloads to maintain intensity without signs/symptoms of physical distress. Continue to progress workloads to maintain intensity without signs/symptoms of physical distress.        Resistance Training   Training Prescription Yes Yes      Weight 4 / green band 4 lbs / green band      Reps 10-15 10-15        Treadmill   MPH 1.7 --      Grade 0 --      Minutes 15 --      METs 2.3 --        NuStep   Level 3 2      SPM 63 47      Minutes 15 30  METs 1.6 1.5        Oxygen   Maintain Oxygen Saturation 88% or higher 88% or higher               Exercise Comments:   Exercise Goals and Review:   Exercise Goals     Row Name 08/22/22 1435 09/10/22 1412 10/09/22 1540         Exercise Goals   Increase Physical Activity Yes Yes Yes     Intervention Provide advice, education, support and counseling about physical activity/exercise needs.;Develop an individualized exercise prescription for aerobic and resistive training based on initial evaluation findings, risk stratification, comorbidities and participant's personal goals. Provide advice, education, support and counseling about physical activity/exercise needs.;Develop an individualized exercise prescription for aerobic and resistive training based on initial evaluation findings, risk stratification, comorbidities and participant's  personal goals. Provide advice, education, support and counseling about physical activity/exercise needs.;Develop an individualized exercise prescription for aerobic and resistive training based on initial evaluation findings, risk stratification, comorbidities and participant's personal goals.     Expected Outcomes Short Term: Attend rehab on a regular basis to increase amount of physical activity.;Long Term: Exercising regularly at least 3-5 days a week.;Long Term: Add in home exercise to make exercise part of routine and to increase amount of physical activity. Short Term: Attend rehab on a regular basis to increase amount of physical activity.;Long Term: Exercising regularly at least 3-5 days a week.;Long Term: Add in home exercise to make exercise part of routine and to increase amount of physical activity. Short Term: Attend rehab on a regular basis to increase amount of physical activity.;Long Term: Exercising regularly at least 3-5 days a week.;Long Term: Add in home exercise to make exercise part of routine and to increase amount of physical activity.     Increase Strength and Stamina Yes Yes Yes     Intervention Provide advice, education, support and counseling about physical activity/exercise needs.;Develop an individualized exercise prescription for aerobic and resistive training based on initial evaluation findings, risk stratification, comorbidities and participant's personal goals. Provide advice, education, support and counseling about physical activity/exercise needs.;Develop an individualized exercise prescription for aerobic and resistive training based on initial evaluation findings, risk stratification, comorbidities and participant's personal goals. Provide advice, education, support and counseling about physical activity/exercise needs.;Develop an individualized exercise prescription for aerobic and resistive training based on initial evaluation findings, risk stratification, comorbidities  and participant's personal goals.     Expected Outcomes Short Term: Increase workloads from initial exercise prescription for resistance, speed, and METs.;Short Term: Perform resistance training exercises routinely during rehab and add in resistance training at home;Long Term: Improve cardiorespiratory fitness, muscular endurance and strength as measured by increased METs and functional capacity ( ) Short Term: Increase workloads from initial exercise prescription for resistance, speed, and METs.;Short Term: Perform resistance training exercises routinely during rehab and add in resistance training at home;Long Term: Improve cardiorespiratory fitness, muscular endurance and strength as measured by increased METs and functional capacity ( ) Short Term: Increase workloads from initial exercise prescription for resistance, speed, and METs.;Short Term: Perform resistance training exercises routinely during rehab and add in resistance training at home;Long Term: Improve cardiorespiratory fitness, muscular endurance and strength as measured by increased METs and functional capacity ( )     Able to understand and use rate of perceived exertion (RPE) scale Yes Yes Yes     Intervention Provide education and explanation on how to use RPE scale Provide education and explanation on how to use RPE scale  Provide education and explanation on how to use RPE scale     Expected Outcomes Short Term: Able to use RPE daily in rehab to express subjective intensity level;Long Term:  Able to use RPE to guide intensity level when exercising independently Short Term: Able to use RPE daily in rehab to express subjective intensity level;Long Term:  Able to use RPE to guide intensity level when exercising independently Short Term: Able to use RPE daily in rehab to express subjective intensity level;Long Term:  Able to use RPE to guide intensity level when exercising independently     Able to understand and use Dyspnea scale Yes  Yes Yes     Intervention Provide education and explanation on how to use Dyspnea scale Provide education and explanation on how to use Dyspnea scale Provide education and explanation on how to use Dyspnea scale     Expected Outcomes Short Term: Able to use Dyspnea scale daily in rehab to express subjective sense of shortness of breath during exertion;Long Term: Able to use Dyspnea scale to guide intensity level when exercising independently Short Term: Able to use Dyspnea scale daily in rehab to express subjective sense of shortness of breath during exertion;Long Term: Able to use Dyspnea scale to guide intensity level when exercising independently Short Term: Able to use Dyspnea scale daily in rehab to express subjective sense of shortness of breath during exertion;Long Term: Able to use Dyspnea scale to guide intensity level when exercising independently     Knowledge and understanding of Target Heart Rate Range (THRR) Yes Yes Yes     Intervention Provide education and explanation of THRR including how the numbers were predicted and where they are located for reference Provide education and explanation of THRR including how the numbers were predicted and where they are located for reference Provide education and explanation of THRR including how the numbers were predicted and where they are located for reference     Expected Outcomes Short Term: Able to state/look up THRR;Long Term: Able to use THRR to govern intensity when exercising independently;Short Term: Able to use daily as guideline for intensity in rehab Short Term: Able to state/look up THRR;Long Term: Able to use THRR to govern intensity when exercising independently;Short Term: Able to use daily as guideline for intensity in rehab Short Term: Able to state/look up THRR;Long Term: Able to use THRR to govern intensity when exercising independently;Short Term: Able to use daily as guideline for intensity in rehab     Understanding of Exercise  Prescription Yes Yes Yes     Intervention Provide education, explanation, and written materials on patient's individual exercise prescription Provide education, explanation, and written materials on patient's individual exercise prescription Provide education, explanation, and written materials on patient's individual exercise prescription     Expected Outcomes Short Term: Able to explain program exercise prescription;Long Term: Able to explain home exercise prescription to exercise independently Short Term: Able to explain program exercise prescription;Long Term: Able to explain home exercise prescription to exercise independently Short Term: Able to explain program exercise prescription;Long Term: Able to explain home exercise prescription to exercise independently              Exercise Goals Re-Evaluation :  Exercise Goals Re-Evaluation     Row Name 09/10/22 1413 10/09/22 1541 10/18/22 1501 10/24/22 1351 11/13/22 0915     Exercise Goal Re-Evaluation   Exercise Goals Review Increase Physical Activity;Increase Strength and Stamina;Able to understand and use rate of perceived exertion (RPE) scale;Able to understand  and use Dyspnea scale;Knowledge and understanding of Target Heart Rate Range (THRR);Understanding of Exercise Prescription Increase Physical Activity;Increase Strength and Stamina;Able to understand and use rate of perceived exertion (RPE) scale;Able to understand and use Dyspnea scale;Knowledge and understanding of Target Heart Rate Range (THRR);Understanding of Exercise Prescription Increase Physical Activity;Increase Strength and Stamina;Understanding of Exercise Prescription -- Increase Physical Activity;Understanding of Exercise Prescription;Increase Strength and Stamina   Comments Pt has completed 4 sessions of pulmonary rehab. She is very motivated to be in the program and to improve her health. She is tolerating exercise and increasing her workload. She sometimes has to use the  head O2 band due to her nail polish and the meter not reading properly. She is currently exercising at 2.0 METs on the treadmill. Will continue to monitor and progress as able. Pt has completed 10 sessions of pulmonary rehab. She continues to be motivated to be in the program and is enjoying coming to class and exercising. She is increasing her workloads. She is currently exercising at 2.15 METs on the treadmill. Will continue to monitor and progress as able. Pt has complete 12 visits of PR. Pt states that she has seen improvment in her stamina and strgenth since starting the program. She is able to walk furthur without taking extra breaks due to SOB. Desiree Barber has increased her SPM on the NuStep. She keeps her speed at 1.5 on the treadmill while RPE is an 13. WIll continue to monitor and progress as able Desiree Barber has been   Expected Outcomes Through exercise at rehab and home, patient will achieve their goals. Through exercise at rehab and home, patient will achieve their goals. Short term : go over home exercise  long term: keep improving workload and building endurance -- --    Row Name 11/22/22 1504 11/27/22 1429 12/13/22 1508 12/26/22 1343       Exercise Goal Re-Evaluation   Exercise Goals Review Increase Physical Activity;Increase Strength and Stamina;Understanding of Exercise Prescription Increase Physical Activity;Able to understand and use rate of perceived exertion (RPE) scale;Increase Strength and Stamina;Able to understand and use Dyspnea scale;Understanding of Exercise Prescription Increase Physical Activity;Increase Strength and Stamina;Understanding of Exercise Prescription Increase Physical Activity;Increase Strength and Stamina;Understanding of Exercise Prescription    Comments Pt feels like she has gotten stronger and has more endurance since starting PR.  Pt has increased her speed to 1.7 on the treadmill and she is going to try to increase to level 3 on the Nustep today.  She is currently  walking 2 days a week at home for about 10 min. Desiree Barber has been doing great with exericse. She has increased her speed on the treadmill to 1.7 and her level on the stepper to 3.0. Will continue to monitor and progress asa able Desiree Barber has been doing great with exercise. She has increased her level on the nustep to level 4. She went to see her pulmonologist and he wanted her to increase her workloads at rehab. She has been having increased weakness/cramping in her legs when walking on the treadmill. She is going to see her vasicular doctor in the middle of October about the pain. She has been using the nustep twice due to pain walking on the treadmill. Desiree Barber has been tolerating exercise well. She has been dealing with pain in her legs when walking on the treadmill so she uses the nustep twice. She is going to see a Vascular doctor about her circulation within the next few weeks.She is doing well on the low impact  equipment like the stepper. Will continue to monitor and progress as able.    Expected Outcomes Short term:  Pt will try to increase to 15-20 min each time she walks at home         Long term:  Pt will continue to build on her walking at home and continue to come to PR Short term: increase walking grade to 0.5 - 1.0   long term: continue to attend PR Short term: continue to exercise on nustep and monitor leg cramping   long term: continue to attened pulmoanry rehab Short term: increase workload on the stepper in the next two weeks    long term: continue to attend rehab             Discharge Exercise Prescription (Final Exercise Prescription Changes):  Exercise Prescription Changes - 12/25/22 1300       Response to Exercise   Blood Pressure (Admit) 104/64    Blood Pressure (Exit) 120/60    Heart Rate (Admit) 95 bpm    Heart Rate (Exercise) 88 bpm    Heart Rate (Exit) 78 bpm    Oxygen Saturation (Admit) 95 %    Oxygen Saturation (Exercise) 94 %    Oxygen Saturation (Exit) 96 %     Rating of Perceived Exertion (Exercise) 12    Perceived Dyspnea (Exercise) 2    Duration Continue with 30 min of aerobic exercise without signs/symptoms of physical distress.    Intensity THRR unchanged      Progression   Progression Continue to progress workloads to maintain intensity without signs/symptoms of physical distress.      Resistance Training   Training Prescription Yes    Weight 4 lbs / green band    Reps 10-15      NuStep   Level 2    SPM 47    Minutes 30    METs 1.5      Oxygen   Maintain Oxygen Saturation 88% or higher             Nutrition:  Target Goals: Understanding of nutrition guidelines, daily intake of sodium 1500mg , cholesterol 200mg , calories 30% from fat and 7% or less from saturated fats, daily to have 5 or more servings of fruits and vegetables.  Biometrics:  Pre Biometrics - 08/22/22 1436       Pre Biometrics   Height 5\' 4"  (1.626 m)    Weight 126 lb 15.8 oz (57.6 kg)    Waist Circumference 33 inches    Hip Circumference 37 inches    Waist to Hip Ratio 0.89 %    BMI (Calculated) 21.79    Triceps Skinfold 29 mm    % Body Fat 35.7 %    Grip Strength 21.2 kg    Flexibility 0 in    Single Leg Stand 10.43 seconds              Nutrition Therapy Plan and Nutrition Goals:  Nutrition Therapy & Goals - 10/01/22 1419       Nutrition Therapy   RD appointment deferred Yes      Personal Nutrition Goals   Comments We provide educational sessions on heart healthy nutrition with handouts.      Intervention Plan   Intervention Nutrition handout(s) given to patient.    Expected Outcomes Short Term Goal: Understand basic principles of dietary content, such as calories, fat, sodium, cholesterol and nutrients.             Nutrition Assessments:  Nutrition Assessments - 08/22/22 1336       MEDFICTS Scores   Pre Score 39            MEDIFICTS Score Key: >=70 Need to make dietary changes  40-70 Heart Healthy Diet <= 40  Therapeutic Level Cholesterol Diet   Picture Your Plate Scores: <16 Unhealthy dietary pattern with much room for improvement. 41-50 Dietary pattern unlikely to meet recommendations for good health and room for improvement. 51-60 More healthful dietary pattern, with some room for improvement.  >60 Healthy dietary pattern, although there may be some specific behaviors that could be improved.    Nutrition Goals Re-Evaluation:  Nutrition Goals Re-Evaluation     Row Name 10/18/22 1509 11/22/22 1526 12/13/22 1521         Goals   Nutrition Goal Healthy Eating -- Healthy Eating     Comment States that she loves to eat ice cream and eats muiltiple times a week. Eats more chicken and fish then red meat. Pts wants to maintain weight and is fine with how she eats. Eats a good amount of veggies. Pt does not eat much red meat, because she prefers chicken and fish.  She states that the chicken she eats is grilled but the fish is fried.  Pt does not each much fruit but does eat a lot of vegetables.  She loves to eat sweets and eats something sweet every day. Shadie has been watching her weight but has been gaining weight. She was recently taken off Trelegy and put on Anoro to be taken off the predison that is in Trelegy. She continues to eat more baked chicken and veggies for her meals. She has cut down on the amount of sweets that she eats.     Expected Outcome Short term goal:cut down on sweets to maintain wright    Long term goal : maintain healthy eat and weight Short term:  Pt will try to cut down on her sweets to 5 days a week vs 7  Long term:  Pt will try grilled/baked seafood to try to maintain a healthy balance of foods Short term: continue to watch amount of sweets   long term: continue to focus on healthy eating              Nutrition Goals Discharge (Final Nutrition Goals Re-Evaluation):  Nutrition Goals Re-Evaluation - 12/13/22 1521       Goals   Nutrition Goal Healthy Eating     Comment [place] has been watching her weight but has been gaining weight. She was recently taken off Trelegy and put on Anoro to be taken off the predison that is in Trelegy. She continues to eat more baked chicken and veggies for her meals. She has cut down on the amount of sweets that she eats.    Expected Outcome Short term: continue to watch amount of sweets   long term: continue to focus on healthy eating             Psychosocial: Target Goals: Acknowledge presence or absence of significant depression and/or stress, maximize coping skills, provide positive support system. Participant is able to verbalize types and ability to use techniques and skills needed for reducing stress and depression.  Initial Review & Psychosocial Screening:  Initial Psych Review & Screening - 08/22/22 1328       Initial Review   Current issues with Current Sleep Concerns;Current Anxiety/Panic      Family Dynamics   Good Support System? Yes  Comments Pt has a good support sytem with her husband, her daughters, and her grandchildren.      Barriers   Psychosocial barriers to participate in program There are no identifiable barriers or psychosocial needs.      Screening Interventions   Interventions Encouraged to exercise;Provide feedback about the scores to participant;To provide support and resources with identified psychosocial needs    Expected Outcomes Short Term goal: Utilizing psychosocial counselor, staff and physician to assist with identification of specific Stressors or current issues interfering with healing process. Setting desired goal for each stressor or current issue identified.;Long Term Goal: Stressors or current issues are controlled or eliminated.;Short Term goal: Identification and review with participant of any Quality of Life or Depression concerns found by scoring the questionnaire.;Long Term goal: The participant improves quality of Life and PHQ9 Scores as seen by post scores and/or  verbalization of changes             Quality of Life Scores:  Quality of Life - 08/22/22 1450       Quality of Life   Select Quality of Life      Quality of Life Scores   Health/Function Pre 22.53 %    Socioeconomic Pre 29.17 %    Psych/Spiritual Pre 29.64 %    Family Pre 28.8 %    GLOBAL Pre 26.09 %            Scores of 19 and below usually indicate a poorer quality of life in these areas.  A difference of  2-3 points is a clinically meaningful difference.  A difference of 2-3 points in the total score of the Quality of Life Index has been associated with significant improvement in overall quality of life, self-image, physical symptoms, and general health in studies assessing change in quality of life.   PHQ-9: Review Flowsheet       08/22/2022  Depression screen PHQ 2/9  Decreased Interest 0  Down, Depressed, Hopeless 0  PHQ - 2 Score 0  Altered sleeping 1  Tired, decreased energy 0  Change in appetite 0  Feeling bad or failure about yourself  0  Trouble concentrating 0  Moving slowly or fidgety/restless 0  Suicidal thoughts 0  PHQ-9 Score 1  Difficult doing work/chores Not difficult at all    Details           Interpretation of Total Score  Total Score Depression Severity:  1-4 = Minimal depression, 5-9 = Mild depression, 10-14 = Moderate depression, 15-19 = Moderately severe depression, 20-27 = Severe depression   Psychosocial Evaluation and Intervention:  Psychosocial Evaluation - 08/22/22 1419       Psychosocial Evaluation & Interventions   Interventions Encouraged to exercise with the program and follow exercise prescription;Stress management education;Relaxation education    Comments Pt has no identifiable psychosocial barriers to completing the PR program.  She is excited to start the program and improve her overall health.  She has a good support system in her husband, daughters, and grandchildren.  She scored a 1 on her PHQ-9 related to the  fact that she has trouble falling asleep at times, but the pt does take a Xanax at night as needed to help her sleep and reduce anxiety.  Right now the pt feels like the Xanax is controlling her anxiety and insomnia.  She quit smoking in March of 2023 after smoking for 55 years.  She does have some lower back pain related to arthritis, and she is still  having "heaviness" in her legs.  She is able to manage her back pain with Tylenol and stretching, and she hopes that once she loses some weight the leg "heaviness" will subside.  Her goals for the program are to breathe better with less shortness of breath and be able to walk/shop more.  We will monitor her progress as she works toward meeting these goals.    Expected Outcomes Pt's anxiety/insomnia will continue to be managed with Xanax, and she will continue to have no idenfiable psychosocial issues.    Continue Psychosocial Services  No Follow up required             Psychosocial Re-Evaluation:  Psychosocial Re-Evaluation     Row Name 09/04/22 1155 10/01/22 1420 10/18/22 1506 11/22/22 1521 12/13/22 1518     Psychosocial Re-Evaluation   Current issues with Current Sleep Concerns Current Sleep Concerns Current Sleep Concerns Current Psychotropic Meds Current Psychotropic Meds;None Identified   Comments Patient is new to the program completing 2 sessions.  She is currently is being treated via PCP for insomnia with xanax prn.  Patient was referred to PR by Dr. 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  Her initial PHQ-9 score is 1 and her QOL score is 26.09%.  We will continue to monitor  her progress as she works toward meeting these goals. Patient has completed 9 sessions. Her insomina is managed with Xanax prn. He continues to have no psychosocial barriers identified. She enjoys the program and demonstrates an interest in improving her health. We will continue to monitor her progress. Pt has seen an improvemnt in her sleep  patterns, being able to have a comforable sleep night. Has been able to decrease medication usage to sleep. Pt states that she is sleeping fine now.  She only takes Xanax "every once in a while."  She denies any problems with anxiety/depression. Desiree Barber has been doing good in rehab. She continues to sleep better now and still only takes her Xanax "every once in a while". Her only worry lastly has been the increased cramping in her legs thats been bothering her. She has an upcoming appointment with her vasicular docotor in October.   Expected Outcomes Patient will have no psychosocial barriers identified at discharge. Patient will have no psychosocial barriers identified at discharge. Short term Goal: continue to improve sleep and decrease medication useage Long term Goals; Continue to have no barries identified Short term:   Continue to practice healthy sleeping habits   Long term:  Pt will continue to have no identifiable psychosocial barriers Short term: continue to monitor leg pain   long term: continue to have no psychosocial barriers and exercise for happiness   Interventions Relaxation education;Encouraged to attend Pulmonary Rehabilitation for the exercise;Stress management education Relaxation education;Encouraged to attend Pulmonary Rehabilitation for the exercise;Stress management education Encouraged to attend Pulmonary Rehabilitation for the exercise;Relaxation education;Stress management education Stress management education;Relaxation education;Encouraged to attend Pulmonary Rehabilitation for the exercise Stress management education;Encouraged to attend Pulmonary Rehabilitation for the exercise;Relaxation education   Continue Psychosocial Services  No Follow up required No Follow up required Follow up required by staff Follow up required by staff Follow up required by staff            Psychosocial Discharge (Final Psychosocial Re-Evaluation):  Psychosocial Re-Evaluation - 12/13/22 1518        Psychosocial Re-Evaluation   Current issues with Current Psychotropic Meds;None Identified    Comments Desiree Barber has been doing good in  rehab. She continues to sleep better now and still only takes her Xanax "every once in a while". Her only worry lastly has been the increased cramping in her legs thats been bothering her. She has an upcoming appointment with her vasicular docotor in October.    Expected Outcomes Short term: continue to monitor leg pain   long term: continue to have no psychosocial barriers and exercise for happiness    Interventions Stress management education;Encouraged to attend Pulmonary Rehabilitation for the exercise;Relaxation education    Continue Psychosocial Services  Follow up required by staff              Education: Education Goals: Education classes will be provided on a weekly basis, covering required topics. Participant will state understanding/return demonstration of topics presented.  Learning Barriers/Preferences:  Learning Barriers/Preferences - 08/22/22 1336       Learning Barriers/Preferences   Learning Barriers None    Learning Preferences Written Material             Education Topics: How Lungs Work and Diseases: - Discuss the anatomy of the lungs and diseases that can affect the lungs, such as COPD. Flowsheet Row PULMONARY REHAB OTHER RESPIRATORY from 11/29/2022 in [place] [facility]  Date 11/29/22  Educator [facility]  Instruction Review Code 1- Verbalizes Understanding       Exercise: -Discuss the importance of exercise, FITT principles of exercise, normal and abnormal responses to exercise, and how to exercise safely.   Environmental Irritants: -Discuss types of environmental irritants and how to limit exposure to environmental irritants.   Meds/Inhalers and oxygen: - Discuss respiratory medications, definition of an inhaler and oxygen, and the proper way to use an inhaler and oxygen.   Energy Saving  Techniques: - Discuss methods to conserve energy and decrease shortness of breath when performing activities of daily living.  Flowsheet Row PULMONARY REHAB OTHER RESPIRATORY from 11/29/2022 in [place] [facility]  Date 10/18/22  Educator DM  Instruction Review Code 1- Verbalizes Understanding       Bronchial Hygiene / Breathing Techniques: - Discuss breathing mechanics, pursed-lip breathing technique,  proper posture, effective ways to clear airways, and other functional breathing techniques Flowsheet Row PULMONARY REHAB OTHER RESPIRATORY from 11/29/2022 in [place] [facility]  Date 10/25/22  Educator HB       Cleaning Equipment: - Provides group verbal and written instruction about the health risks of elevated stress, cause of high stress, and healthy ways to reduce stress. Flowsheet Row PULMONARY REHAB OTHER RESPIRATORY from 11/29/2022 in [place] [facility]  Date 11/01/22  Educator [facility]  Instruction Review Code 1- Verbalizes Understanding       Nutrition I: Fats: - Discuss the types of cholesterol, what cholesterol does to the body, and how cholesterol levels can be controlled.   Nutrition II: Labels: -Discuss the different components of food labels and how to read food labels.   Respiratory Infections: - Discuss the signs and symptoms of respiratory infections, ways to prevent respiratory infections, and the importance of seeking medical treatment when having a respiratory infection. Flowsheet Row PULMONARY REHAB OTHER RESPIRATORY from 11/29/2022 in [place] [facility]  Date 08/30/22  Educator Handout       Stress I: Signs and Symptoms: - Discuss the causes of stress, how stress may lead to anxiety and depression, and ways to limit stress. Flowsheet Row PULMONARY REHAB OTHER RESPIRATORY from 11/29/2022 in [place] [facility]  Date 09/06/22  Educator handout  Stress II: Relaxation: -Discuss  relaxation techniques to limit stress. Flowsheet Row PULMONARY REHAB OTHER RESPIRATORY from 11/29/2022 in [place] [facility]  Date 09/13/22  Educator handout       Oxygen for Home/Travel: - Discuss how to prepare for travel when on oxygen and proper ways to transport and store oxygen to ensure safety. Flowsheet Row PULMONARY REHAB OTHER RESPIRATORY from 11/29/2022 in [place] [facility]  Date 09/20/22  Educator handout       Knowledge Questionnaire Score:  Knowledge Questionnaire Score - 08/22/22 1337       Knowledge Questionnaire Score   Pre Score 12/18             Core Components/Risk Factors/Patient Goals at Admission:  Personal Goals and Risk Factors at Admission - 08/22/22 1342       Core Components/Risk Factors/Patient Goals on Admission    Weight Management Weight Loss    Improve shortness of breath with ADL's Yes    Intervention Provide education, individualized exercise plan and daily activity instruction to help decrease symptoms of SOB with activities of daily living.    Expected Outcomes Short Term: Improve cardiorespiratory fitness to achieve a reduction of symptoms when performing ADLs;Long Term: Be able to perform more ADLs without symptoms or delay the onset of symptoms    Increase knowledge of respiratory medications and ability to use respiratory devices properly  Yes    Intervention Provide education and demonstration as needed of appropriate use of medications, inhalers, and oxygen therapy.    Expected Outcomes Short Term: Achieves understanding of medications use. Understands that oxygen is a medication prescribed by physician. Demonstrates appropriate use of inhaler and oxygen therapy.;Long Term: Maintain appropriate use of medications, inhalers, and oxygen therapy.    Hypertension Yes    Intervention Provide education on lifestyle modifcations including regular physical activity/exercise, weight management, moderate sodium  restriction and increased consumption of fresh fruit, vegetables, and low fat dairy, alcohol moderation, and smoking cessation.    Expected Outcomes Short Term: Continued assessment and intervention until BP is < 140/1mm HG in hypertensive participants. < 130/72mm HG in hypertensive participants with diabetes, heart failure or chronic kidney disease.;Long Term: Maintenance of blood pressure at goal levels.    Personal Goal Other Yes    Personal Goal Pt wants to breathe better with less shortness of breath and be able to walk more and shop.    Intervention Pt will attend PR twice a week and start a home exercise program.    Expected Outcomes Pt will attend PR twice a week and complete the program meeting both her personal and program goals.             Core Components/Risk Factors/Patient Goals Review:   Goals and Risk Factor Review     Row Name 09/04/22 1206 10/01/22 1423 10/18/22 1515 11/22/22 1534 12/13/22 1529     Core Components/Risk Factors/Patient Goals Review   Personal Goals Review Improve shortness of breath with ADL's;Develop more efficient breathing techniques such as purse lipped breathing and diaphragmatic breathing and practicing self-pacing with activity. Improve shortness of breath with ADL's;Develop more efficient breathing techniques such as purse lipped breathing and diaphragmatic breathing and practicing self-pacing with activity. Improve shortness of breath with ADL's;Develop more efficient breathing techniques such as purse lipped breathing and diaphragmatic breathing and practicing self-pacing with activity. Weight Management/Obesity;Hypertension;Increase knowledge of respiratory medications and ability to use respiratory devices properly.;Develop more efficient breathing techniques such as purse lipped breathing and diaphragmatic breathing and  practicing self-pacing with activity. Weight Management/Obesity;Hypertension;Increase knowledge of respiratory medications and  ability to use respiratory devices properly.;Develop more efficient breathing techniques such as purse lipped breathing and diaphragmatic breathing and practicing self-pacing with activity.   Review Patient is new to the PR program attending 2 sessions.  She seems to enjoy class and interactive with class and staff.  Patient was referred to PR by Dr. 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  Her personal goals for the program are to have less SOB , breath better and be abe to walk more while shopping.  She tolerates exercise well while on Treadmill and nu-step with O2 sats maintaining at 92% without any oxygen supplemental needed at this time.  We will continue to monitor as she works toward meeting these goals as she progresses in the program. Patient had completed 9 sessions. She is doing well in the program with consistent attendance. Her current weight is 57.3 KG down 0.9 KG from last 30 day review. She tolerates exercise well while on Treadmill and nu-step with O2 sats maintaining at 93% without any oxygen supplemental needed at this time. Her personal goals for the program are to have less SOB , breath better and be abe to walk more while shopping. We will continue to monitor as she works toward meeting these goals as she progresses in the program. Pt has been able to improve her SOB with her ADL's. Was able to shop with family and not have to take muilple breaks. Have not needed to use pursed lip breathing tech, has not pushed herself to the limit to need inhaler. Pt's weight has fluctuated but she wants to get down to 120 lbs.  Her current weight is 129 lbs and she knows that she needs to cut down on her sweets in order to lose weight.  She also mentioned that if she lost the extra weight that might make her legs feel less tired.  She is no longer on any BP medication. Desiree Barber continues to [employer] her weight, she is staying between 128lbs to 131lbs. She has been taken off a  medication by her pulmonologist that might help with her weight. She continues to watch what she eats to manage her weight. She continues to have leg pain and is going to see her vasicular doctor about the leg pain.   Expected Outcomes Patient will complete the program meeting both program and personal goals. Patient will complete the program meeting both program and personal goals. short term goal: going over pursed lip breathing long term goals: continue to improve SOB with ADL's Short term:  Pt is going to try to adjust her diet to lose weight in order to decrease her SOB even more  Long term:  Continue to have a positive attitude and willingness to improve her overall health Short term: continue to manage weight   long term: continue to have a positive attitude and willingness to help improve her overall health.            Core Components/Risk Factors/Patient Goals at Discharge (Final Review):   Goals and Risk Factor Review - 12/13/22 1529       Core Components/Risk Factors/Patient Goals Review   Personal Goals Review Weight Management/Obesity;Hypertension;Increase knowledge of respiratory medications and ability to use respiratory devices properly.;Develop more efficient breathing techniques such as purse lipped breathing and diaphragmatic breathing and practicing self-pacing with activity.    Review Desiree Barber continues to [employer] her weight, she is staying between  128lbs to 131lbs. She has been taken off a medication by her pulmonologist that might help with her weight. She continues to watch what she eats to manage her weight. She continues to have leg pain and is going to see her vasicular doctor about the leg pain.    Expected Outcomes Short term: continue to manage weight   long term: continue to have a positive attitude and willingness to help improve her overall health.             ITP Comments:  ITP Comments     Row Name 10/10/22 1613 11/07/22 1519 12/05/22 1526 01/02/23 1640      ITP Comments 30 day review completed. ITP sent to Dr.Jehanzeb Memon, Medical Director of  Pulmonary Rehab. Continue with ITP unless changes are made by physician. 30 day review completed. ITP sent to Dr.Jehanzeb Memon, Medical Director of  Pulmonary Rehab. Continue with ITP unless changes are made by physician. 30 day review completed. ITP sent to Dr.Jehanzeb Memon, Medical Director of  Pulmonary Rehab. Continue with ITP unless changes are made by physician. 30 day review completed. ITP sent to Dr.Jehanzeb Memon, Medical Director of  Pulmonary Rehab. Continue with ITP unless changes are made by physician.             Comments: 30 day review

## 2023-01-03 ENCOUNTER — Encounter (HOSPITAL_COMMUNITY)
Admission: RE | Admit: 2023-01-03 | Discharge: 2023-01-03 | Disposition: A | Discharge status: Home / Self Care | Payer: Medicare Other | Source: Ambulatory Visit | Attending: Family Medicine | Admitting: Family Medicine

## 2023-01-03 DIAGNOSIS — J9611 Chronic respiratory failure with hypoxia: Secondary | ICD-10-CM

## 2023-01-03 DIAGNOSIS — J449 Chronic obstructive pulmonary disease, unspecified: Secondary | ICD-10-CM | POA: Diagnosis not present

## 2023-01-03 NOTE — Progress Notes (Signed)
Daily Session Note  Patient Details  Name: TEKEYAH SANTIAGO MRN: [id number] Date of Birth: 06/07/1950 Referring Provider:   Flowsheet Row PULMONARY REHAB OTHER RESP ORIENTATION from 08/22/2022 in [facility]  Referring Provider Dr. Sherene Sires       Encounter Date: 01/03/2023  Check In:  Session Check In - 01/03/23 1423       Check-In   Supervising physician immediately available to respond to emergencies See telemetry face sheet for immediately available MD    Location [facility]    Staff Present Ross Ludwig, BS, Exercise Physiologist;Tanisa Lagace [place] BSN, RN;Daphyne Daphine Deutscher, RN, BSN    Virtual Visit No    Medication changes reported     No    Fall or balance concerns reported    No    Tobacco Cessation No Change    Warm-up and Cool-down Performed on first and last piece of equipment    Resistance Training Performed Yes    VAD Patient? No    PAD/SET Patient? No      Pain Assessment   Currently in Pain? No/denies    Pain Score 0-No pain    Multiple Pain Sites No             Capillary Blood Glucose: No results found for this or any previous visit (from the past 24 hour(s)).    Social History   Tobacco Use  Smoking Status Former   Current packs/day: 0.00   Average packs/day: 0.5 packs/day for 55.0 years (27.5 ttl pk-yrs)   Types: Cigarettes   Start date: 05/30/1966   Quit date: 05/29/2021   Years since quitting: 1.6   Passive exposure: Past  Smokeless Tobacco Former  Tobacco Comments   Quit date 05/29/2021    Goals Met:  Proper associated with RPD/PD & O2 Sat Independence with exercise equipment Using PLB without cueing & demonstrates good technique Exercise tolerated well Queuing for purse lip breathing No report of concerns or symptoms today Strength training completed today  Goals Unmet:  Not Applicable  Comments: Marland KitchenMarland KitchenPt able to follow exercise prescription today without complaint.  Will continue to monitor for  progression.

## 2023-01-07 ENCOUNTER — Other Ambulatory Visit: Payer: Self-pay | Admitting: *Deleted

## 2023-01-07 DIAGNOSIS — I739 Peripheral vascular disease, unspecified: Secondary | ICD-10-CM

## 2023-01-08 ENCOUNTER — Encounter (HOSPITAL_COMMUNITY): Payer: Medicare Other

## 2023-01-08 DIAGNOSIS — K2970 Gastritis, unspecified, without bleeding: Secondary | ICD-10-CM | POA: Diagnosis not present

## 2023-01-08 DIAGNOSIS — Z6822 Body mass index (BMI) 22.0-22.9, adult: Secondary | ICD-10-CM | POA: Diagnosis not present

## 2023-01-08 DIAGNOSIS — D509 Iron deficiency anemia, unspecified: Secondary | ICD-10-CM | POA: Diagnosis not present

## 2023-01-08 DIAGNOSIS — B9681 Helicobacter pylori [H. pylori] as the cause of diseases classified elsewhere: Secondary | ICD-10-CM | POA: Diagnosis not present

## 2023-01-08 DIAGNOSIS — I10 Essential (primary) hypertension: Secondary | ICD-10-CM | POA: Diagnosis not present

## 2023-01-08 DIAGNOSIS — J449 Chronic obstructive pulmonary disease, unspecified: Secondary | ICD-10-CM | POA: Diagnosis not present

## 2023-01-08 DIAGNOSIS — D649 Anemia, unspecified: Secondary | ICD-10-CM | POA: Diagnosis not present

## 2023-01-08 DIAGNOSIS — I739 Peripheral vascular disease, unspecified: Secondary | ICD-10-CM | POA: Diagnosis not present

## 2023-01-09 ENCOUNTER — Other Ambulatory Visit: Payer: Self-pay | Admitting: *Deleted

## 2023-01-09 ENCOUNTER — Other Ambulatory Visit: Payer: Self-pay | Admitting: Gastroenterology

## 2023-01-09 DIAGNOSIS — D509 Iron deficiency anemia, unspecified: Secondary | ICD-10-CM

## 2023-01-09 DIAGNOSIS — D649 Anemia, unspecified: Secondary | ICD-10-CM

## 2023-01-10 ENCOUNTER — Encounter (HOSPITAL_COMMUNITY): Payer: Self-pay | Admitting: *Deleted

## 2023-01-10 ENCOUNTER — Other Ambulatory Visit: Payer: Self-pay | Admitting: Gastroenterology

## 2023-01-10 ENCOUNTER — Encounter (HOSPITAL_COMMUNITY): Payer: Medicare Other

## 2023-01-10 ENCOUNTER — Telehealth: Payer: Self-pay | Admitting: Gastroenterology

## 2023-01-10 ENCOUNTER — Encounter: Payer: Self-pay | Admitting: Gastroenterology

## 2023-01-10 ENCOUNTER — Emergency Department (HOSPITAL_COMMUNITY)
Admission: EM | Admit: 2023-01-10 | Discharge: 2023-01-10 | Disposition: A | Discharge status: Home / Self Care | Payer: Medicare Other

## 2023-01-10 ENCOUNTER — Other Ambulatory Visit: Payer: Self-pay

## 2023-01-10 DIAGNOSIS — K2970 Gastritis, unspecified, without bleeding: Secondary | ICD-10-CM

## 2023-01-10 DIAGNOSIS — D649 Anemia, unspecified: Secondary | ICD-10-CM | POA: Insufficient documentation

## 2023-01-10 DIAGNOSIS — Z7902 Long term (current) use of antithrombotics/antiplatelets: Secondary | ICD-10-CM | POA: Diagnosis not present

## 2023-01-10 LAB — CBC
HCT: 25.4 % — ABNORMAL LOW (ref 36.0–46.0)
Hematocrit: 27.3 % — ABNORMAL LOW (ref 34.0–46.6)
Hemoglobin: 7 g/dL — CL (ref 11.1–15.9)
Hemoglobin: 6.6 g/dL — CL (ref 12.0–15.0)
MCH: 20.9 pg — ABNORMAL LOW (ref 26.6–33.0)
MCH: 21.2 pg — ABNORMAL LOW (ref 26.0–34.0)
MCHC: 25.6 g/dL — ABNORMAL LOW (ref 31.5–35.7)
MCHC: 26 g/dL — ABNORMAL LOW (ref 30.0–36.0)
MCV: 82 fL (ref 79–97)
MCV: 81.4 fL (ref 80.0–100.0)
Platelets: 469 10*3/uL — ABNORMAL HIGH (ref 150–450)
Platelets: 444 10*3/uL — ABNORMAL HIGH (ref 150–400)
RBC: 3.35 x10E6/uL — ABNORMAL LOW (ref 3.77–5.28)
RBC: 3.12 MIL/uL — ABNORMAL LOW (ref 3.87–5.11)
RDW: 15.4 % (ref 11.7–15.4)
RDW: 17.1 % — ABNORMAL HIGH (ref 11.5–15.5)
WBC: 8.8 10*3/uL (ref 3.4–10.8)
WBC: 7.5 10*3/uL (ref 4.0–10.5)
nRBC: 0.4 % — ABNORMAL HIGH (ref 0.0–0.2)

## 2023-01-10 LAB — BASIC METABOLIC PANEL
Anion gap: 9 (ref 5–15)
BUN: 8 mg/dL (ref 8–23)
CO2: 25 mmol/L (ref 22–32)
Calcium: 8.3 mg/dL — ABNORMAL LOW (ref 8.9–10.3)
Chloride: 102 mmol/L (ref 98–111)
Creatinine, Ser: 0.64 mg/dL (ref 0.44–1.00)
GFR, Estimated: >60 mL/min (ref >60)
Glucose, Bld: 104 mg/dL — ABNORMAL HIGH (ref 70–99)
Potassium: 4 mmol/L (ref 3.5–5.1)
Sodium: 136 mmol/L (ref 135–145)

## 2023-01-10 LAB — IRON,TIBC AND FERRITIN PANEL
Ferritin: 8 ng/mL — ABNORMAL LOW (ref 15–150)
Iron Saturation: 2 % — CL (ref 15–55)
Iron: 11 ug/dL — ABNORMAL LOW (ref 27–139)
Total Iron Binding Capacity: 491 ug/dL — ABNORMAL HIGH (ref 250–450)
UIBC: 480 ug/dL — ABNORMAL HIGH (ref 118–369)

## 2023-01-10 LAB — H. PYLORI BREATH TEST

## 2023-01-10 LAB — ABO/RH: ABO/RH(D): O NEG

## 2023-01-10 LAB — POC OCCULT BLOOD, ED: Fecal Occult Bld: POSITIVE — AB

## 2023-01-10 LAB — PREPARE RBC (CROSSMATCH)

## 2023-01-10 MED ORDER — LEVOFLOXACIN 500 MG PO TABS
500.0000 mg | ORAL_TABLET | Freq: Every day | ORAL | 0 refills | Status: AC
Start: 2023-01-10 — End: 2023-01-24

## 2023-01-10 MED ORDER — AMOXICILLIN 250 MG PO CHEW: 750.0000 mg | CHEWABLE_TABLET | Freq: Three times a day (TID) | ORAL | 0 refills | Status: AC

## 2023-01-10 MED ORDER — AMOXICILLIN 500 MG PO TABS: 750.0000 mg | ORAL_TABLET | Freq: Two times a day (BID) | ORAL | 0 refills | Status: DC

## 2023-01-10 MED ORDER — SODIUM CHLORIDE 0.9% IV SOLUTION: Freq: Once | INTRAVENOUS | Status: AC

## 2023-01-10 NOTE — ED Notes (Signed)
Blood ready per lab, Gala Romney RN aware

## 2023-01-10 NOTE — ED Triage Notes (Signed)
Pt states she was following up with her GI doctor and had labs drawn 2 days ago and was told to come to ED due to her hemoglobin was at 7  Pt c/o feeling tired and sob  Pt denies any pain   Pt states she has not seen any blood in her stools

## 2023-01-10 NOTE — ED Provider Notes (Signed)
[place] EMERGENCY DEPARTMENT AT [facility] Provider Note   CSN: [id number] Arrival date & time: 01/10/23  0747     History  Chief Complaint  Patient presents with   low hemoglobin    Desiree Barber is a 72 y.o. female.  72 year old female to the emergency department for low hemoglobin.  She had GI doc appointment couple days ago and told that she was anemic and go to the emergency department.  Patient denies lightheadedness or dizziness, she has noted some generalized fatigue and some shortness of breath/dyspnea on exertion over the past several months.,  States she feels fine lying in bed.  No overt bleeding.  No BC Goody powders or ibuprofen.  Not taking blood thinners        Home Medications Prior to Admission medications   Medication Sig Start Date End Date Taking? Authorizing Provider  amoxicillin (AMOXIL) 250 MG chewable tablet Chew 3 tablets (750 mg total) by mouth 3 (three) times daily for 14 days. 01/10/23 01/24/23  Aida Raider, NP  albuterol (PROVENTIL) (2.5 MG/3ML) 0.083% nebulizer solution Up to every 4 hours as needed 07/10/22   Nyoka Cowden, MD  albuterol (VENTOLIN HFA) 108 (90 Base) MCG/ACT inhaler Inhale into the lungs. 02/20/22   [provider]  ALPRAZolam Prudy Feeler) 0.25 MG tablet Take 0.25 mg by mouth daily as needed for anxiety or sleep. 06/12/21   [provider]  Ascorbic Acid (VITAMIN C) 100 MG tablet Take 100 mg by mouth daily.    [provider]  clopidogrel (PLAVIX) 75 MG tablet Take 1 tablet (75 mg total) by mouth daily. 04/10/22   Nada Libman, MD  levofloxacin (LEVAQUIN) 500 MG tablet Take 1 tablet (500 mg total) by mouth daily for 14 days. 01/10/23 01/24/23  Aida Raider, NP  losartan (COZAAR) 25 MG tablet Take 1 tablet (25 mg total) by mouth daily. Patient not taking: Reported on 12/25/2022 02/19/22   Meriam Sprague, MD  pantoprazole (PROTONIX) 40 MG tablet Take 1 tablet (40 mg total) by  mouth 2 (two) times daily. 09/06/22 09/06/23  Lanelle Bal, DO  umeclidinium-vilanterol (ANORO ELLIPTA) 62.5-25 MCG/ACT AEPB Inhale 1 puff into the lungs daily. 12/13/22   Nyoka Cowden, MD  vitamin B-12 (CYANOCOBALAMIN) 100 MCG tablet Take 100 mcg by mouth daily.    [provider]  VITAMIN D, CHOLECALCIFEROL, PO Take 1 tablet by mouth daily. One daily    [provider]      Allergies    Demerol [meperidine], Oxycontin [oxycodone hcl], Fish allergy, Lavender oil, and Other    Review of Systems   Review of Systems  Physical Exam Updated Vital Signs BP (!) 143/109 (BP Location: Left Arm)   Pulse 87   Temp 98.4 F (36.9 C) (Oral)   Resp 19   Ht 5\' 5"  (1.651 m)   Wt 59.1 kg   SpO2 95%   BMI 21.68 kg/m  Physical Exam Vitals and nursing note reviewed.  Constitutional:      General: She is not in acute distress.    Appearance: She is not toxic-appearing.  HENT:     Head: Normocephalic.     Nose: Nose normal.  Eyes:     Conjunctiva/sclera: Conjunctivae normal.  Cardiovascular:     Rate and Rhythm: Normal rate and regular rhythm.  Pulmonary:     Effort: Pulmonary effort is normal.     Breath sounds: Normal breath sounds.  Abdominal:  General: Abdomen is flat. There is no distension.     Palpations: Abdomen is soft.     Tenderness: There is no abdominal tenderness. There is no guarding or rebound.  Skin:    General: Skin is warm and dry.     Capillary Refill: Capillary refill takes less than 2 seconds.  Neurological:     Mental Status: She is alert.  Psychiatric:        Mood and Affect: Mood normal.        Behavior: Behavior normal.     ED Results / Procedures / Treatments   Labs (all labs ordered are listed, but only abnormal results are displayed) Labs Reviewed  CBC - Abnormal; Notable for the following components:      Result Value   RBC 3.12 (*)    Hemoglobin 6.6 (*)    HCT 25.4 (*)    MCH 21.2 (*)    MCHC 26.0 (*)    RDW 17.1 (*)     Platelets 444 (*)    nRBC 0.4 (*)    All other components within normal limits  BASIC METABOLIC PANEL - Abnormal; Notable for the following components:   Glucose, Bld 104 (*)    Calcium 8.3 (*)    All other components within normal limits  POC OCCULT BLOOD, ED - Abnormal; Notable for the following components:   Fecal Occult Bld POSITIVE (*)    All other components within normal limits  TYPE AND SCREEN  PREPARE RBC (CROSSMATCH)  ABO/RH    EKG None  Radiology No results found.  Procedures Procedures    Medications Ordered in ED Medications  0.9 %  sodium chloride infusion (Manually program via Guardrails IV Fluids) (0 mLs Intravenous Stopped 01/10/23 1552)    ED Course/ Medical Decision Making/ A&P Clinical Course as of 01/10/23 1604  Thu Jan 10, 2023  1027 Spoke with GI; they are aware of patient. Recommending transfuse and discharge.   [TY]    Clinical Course User Index [TY] Coral Spikes, DO                                 Medical Decision Making This is a 72 year old female presenting emergency department for anemia.  Recently seen outpatient and told she had low hemoglobin.  Hemoglobin today at 6.6.  She is Hemoccult positive.  No overt blood in stool however.  Other labs largely reassuring no significant metabolic derangements.  Normal kidney function.  Case discussed with GI who is following patient and states that unless patient has overt bleeding can follow-up outpatient.  Patient received 2 units of blood tolerated transfusions.  Stable for discharge at this time.  Amount and/or Complexity of Data Reviewed External Data Reviewed:     Details: H. pylori positive; appears GI as antibiotics ordered for her Labs: ordered.  Risk Prescription drug management.          Final Clinical Impression(s) / ED Diagnoses Final diagnoses:  None    Rx / DC Orders ED Discharge Orders     None         Coral Spikes, DO 01/10/23 1604

## 2023-01-10 NOTE — Discharge Instructions (Signed)
Please follow-up with your stomach doctors.  Return immediately felt fevers, chills, chest pain, shortness of breath, blood in your stool, abdominal pain or any new or worsening symptoms that are concerning to you.

## 2023-01-10 NOTE — Telephone Encounter (Signed)
Spoke to pt's husband Iantha Fallen was told that pt was still at the hospital. He will get pt to give [place] a call back in the morning because she will be late getting home.

## 2023-01-10 NOTE — Telephone Encounter (Signed)
Patient seen briefly while in the ED getting blood. No overt GI bleeding. She is heme +. Patient does not want to be admitted if possible. From a GI standpoint, her work up can be done as an outpatient. She is aware that her Hpylori breath test was positive, discussed with her the treatment and she is aware to increase PPI BID while on levaquin/amoxicillin.   She is aware that Brooke Bonito NP will be in touch with next step.

## 2023-01-11 LAB — TYPE AND SCREEN
ABO/RH(D): O NEG
Antibody Screen: NEGATIVE
Unit division: 0
Unit division: 0

## 2023-01-11 LAB — BPAM RBC
Blood Product Expiration Date: 202411052359
Blood Product Expiration Date: 202411062359
ISSUE DATE / TIME: 202410171041
ISSUE DATE / TIME: 202410171323
Unit Type and Rh: 9500
Unit Type and Rh: 9500

## 2023-01-14 NOTE — Telephone Encounter (Signed)
Patient called back. She wants to talk with her daughter in law 1st. She will call back with her decision

## 2023-01-15 ENCOUNTER — Encounter (HOSPITAL_COMMUNITY): Payer: Medicare Other

## 2023-01-15 ENCOUNTER — Encounter: Payer: Self-pay | Admitting: *Deleted

## 2023-01-15 NOTE — Telephone Encounter (Signed)
Patient called back. Scheduled for 11/5. Discussed instructions with her. Also sent to mychart.

## 2023-01-16 ENCOUNTER — Ambulatory Visit (INDEPENDENT_AMBULATORY_CARE_PROVIDER_SITE_OTHER): Payer: Medicare Other | Admitting: Vascular Surgery

## 2023-01-16 ENCOUNTER — Ambulatory Visit (HOSPITAL_COMMUNITY)
Admission: RE | Admit: 2023-01-16 | Discharge: 2023-01-16 | Disposition: A | Discharge status: Home / Self Care | Payer: Medicare Other | Source: Ambulatory Visit | Attending: Vascular Surgery | Admitting: Vascular Surgery

## 2023-01-16 ENCOUNTER — Encounter: Payer: Self-pay | Admitting: Vascular Surgery

## 2023-01-16 ENCOUNTER — Ambulatory Visit (INDEPENDENT_AMBULATORY_CARE_PROVIDER_SITE_OTHER)
Admission: RE | Admit: 2023-01-16 | Discharge: 2023-01-16 | Disposition: A | Discharge status: Home / Self Care | Payer: Medicare Other | Source: Ambulatory Visit | Attending: Vascular Surgery | Admitting: Vascular Surgery

## 2023-01-16 VITALS — BP 157/81 | HR 78 | Temp 97.7°F | Ht 65.0 in | Wt 127.0 lb

## 2023-01-16 DIAGNOSIS — I739 Peripheral vascular disease, unspecified: Secondary | ICD-10-CM

## 2023-01-16 DIAGNOSIS — I70213 Atherosclerosis of native arteries of extremities with intermittent claudication, bilateral legs: Secondary | ICD-10-CM | POA: Diagnosis not present

## 2023-01-16 LAB — VAS US ABI WITH/WO TBI
Left ABI: 0.86
Right ABI: 0.97

## 2023-01-16 NOTE — Progress Notes (Signed)
Patient ID: Desiree Barber, female   DOB: 1950-07-22, 71 y.o.   MRN: [id number]  Reason for Consult: Follow-up   Referred by Assunta Found, MD  Subjective:     HPI:  Desiree Barber is a 72 y.o. female has a history of bilateral kissing common iliac artery stents performed in January of this year for short distance claudication right worse than left.  She is a former smoker but quit 20 months ago.  She also has hypertension.  Recently she was having recurrent weakness in her right lower extremities which had resolved after the stenting was found to be anemic and her symptoms are now resolved after blood transfusion.  She continues on Plavix.  Denies tissue loss or ulceration.  Past Medical History:  Diagnosis Date   Anemia    Anxiety    Occasional   Arthritis    COPD (chronic obstructive pulmonary disease) (HCC)    HTN (hypertension)    Iron deficiency anemia due to chronic blood loss 03/28/2022   Peripheral arterial disease (HCC)    Family History  Problem Relation Age of Onset   Colon cancer Neg Hx    Breast cancer Neg Hx    Past Surgical History:  Procedure Laterality Date   ABDOMINAL AORTOGRAM W/LOWER EXTREMITY N/A 04/10/2022   Procedure: ABDOMINAL AORTOGRAM W/LOWER EXTREMITY;  Surgeon: Nada Libman, MD;  Location: [facility];  Service: Cardiovascular;  Laterality: N/A;   BIOPSY  08/27/2022   Procedure: BIOPSY;  Surgeon: Lanelle Bal, DO;  Location: [facility];  Service: Endoscopy;;   COLONOSCOPY  02/23/04   RMR: normal rectum and colon.minimal internal hemorrhoids   COLONOSCOPY WITH PROPOFOL N/A 06/13/2015   Procedure: COLONOSCOPY WITH PROPOFOL;  Surgeon: Corbin Ade, MD;  Location: [facility];  Service: Endoscopy;  Laterality: N/A;  1045   COLONOSCOPY WITH PROPOFOL N/A 08/27/2022   Procedure: COLONOSCOPY WITH PROPOFOL;  Surgeon: Lanelle Bal, DO;  Location: [facility];  Service: Endoscopy;  Laterality: N/A;  8:45 am, asa 3    ESOPHAGOGASTRODUODENOSCOPY (EGD) WITH PROPOFOL N/A 08/27/2022   Procedure: ESOPHAGOGASTRODUODENOSCOPY (EGD) WITH PROPOFOL;  Surgeon: Lanelle Bal, DO;  Location: [facility];  Service: Endoscopy;  Laterality: N/A;   HOT HEMOSTASIS  08/27/2022   Procedure: HOT HEMOSTASIS (ARGON PLASMA COAGULATION/BICAP);  Surgeon: Lanelle Bal, DO;  Location: [facility];  Service: Endoscopy;;   POLYPECTOMY  06/13/2015   Procedure: POLYPECTOMY;  Surgeon: Corbin Ade, MD;  Location: [facility];  Service: Endoscopy;;  sigmoid colon polyp   TOOTH EXTRACTION     TRIGGER FINGER RELEASE Bilateral    TUBAL LIGATION      Short Social History:  Social History   Tobacco Use   Smoking status: Former    Current packs/day: 0.00    Average packs/day: 0.5 packs/day for 55.0 years (27.5 ttl pk-yrs)    Types: Cigarettes    Start date: 05/30/1966    Quit date: 05/29/2021    Years since quitting: 1.6    Passive exposure: Past   Smokeless tobacco: Former   Tobacco comments:    Quit date 05/29/2021  Substance Use Topics   Alcohol use: Yes    Alcohol/week: 0.0 standard drinks of alcohol    Comment: 3 beers a day    Allergies  Allergen Reactions   Demerol [Meperidine] Nausea And Vomiting   Oxycontin [Oxycodone Hcl] Nausea And Vomiting   Fish Allergy Nausea Only    Only fish  from [place]   Lavender Oil Itching    And hives   Other Itching    Flower oils-gets hives too    Current Outpatient Medications  Medication Sig Dispense Refill   amoxicillin (AMOXIL) 250 MG chewable tablet Chew 3 tablets (750 mg total) by mouth 3 (three) times daily for 14 days. 126 tablet 0   albuterol (PROVENTIL) (2.5 MG/3ML) 0.083% nebulizer solution Up to every 4 hours as needed 75 mL 12   albuterol (VENTOLIN HFA) 108 (90 Base) MCG/ACT inhaler Inhale into the lungs.     ALPRAZolam (XANAX) 0.25 MG tablet Take 0.25 mg by mouth daily as needed for anxiety or sleep.     Ascorbic Acid (VITAMIN C) 100 MG tablet Take 100 mg  by mouth daily.     clopidogrel (PLAVIX) 75 MG tablet Take 1 tablet (75 mg total) by mouth daily. 30 tablet 11   levofloxacin (LEVAQUIN) 500 MG tablet Take 1 tablet (500 mg total) by mouth daily for 14 days. 14 tablet 0   losartan (COZAAR) 25 MG tablet Take 1 tablet (25 mg total) by mouth daily. (Patient not taking: Reported on 12/25/2022) 90 tablet 2   pantoprazole (PROTONIX) 40 MG tablet Take 1 tablet (40 mg total) by mouth 2 (two) times daily. 60 tablet 11   umeclidinium-vilanterol (ANORO ELLIPTA) 62.5-25 MCG/ACT AEPB Inhale 1 puff into the lungs daily. 1 each 11   vitamin B-12 (CYANOCOBALAMIN) 100 MCG tablet Take 100 mcg by mouth daily.     VITAMIN D, CHOLECALCIFEROL, PO Take 1 tablet by mouth daily. One daily     No current facility-administered medications for this visit.    Review of Systems  Constitutional: Positive for fatigue.  HENT: HENT negative.  Eyes: Eyes negative.  Respiratory: Respiratory negative.  Cardiovascular: Cardiovascular negative.  GI: Gastrointestinal negative.  Musculoskeletal: Musculoskeletal negative.  Skin: Skin negative.  Neurological: Neurological negative. Hematologic: Hematologic/lymphatic negative.  Psychiatric: Psychiatric negative.        Objective:  Objective   Vitals:   01/16/23 1104  BP: (!) 157/81  Pulse: 78  Temp: 97.7 F (36.5 C)  TempSrc: Temporal  SpO2: 92%  Weight: 127 lb (57.6 kg)  Height: 5\' 5"  (1.651 m)   Body mass index is 21.13 kg/m.  Physical Exam HENT:     Head: Normocephalic.     Nose: Nose normal.  Eyes:     Pupils: Pupils are equal, round, and reactive to light.  Neck:     Vascular: No carotid bruit.  Cardiovascular:     Rate and Rhythm: Normal rate.     Pulses:          Popliteal pulses are 2+ on the right side and 2+ on the left side.     Comments: 2+ bilateral anterior tibial pulses are palpable Pulmonary:     Effort: Pulmonary effort is normal.  Abdominal:     General: Abdomen is flat.      Palpations: Abdomen is soft. There is no mass.  Musculoskeletal:     Right lower leg: No edema.     Left lower leg: No edema.  Skin:    General: Skin is warm.     Capillary Refill: Capillary refill takes less than 2 seconds.  Neurological:     General: No focal deficit present.     Mental Status: She is alert.  Psychiatric:        Mood and Affect: Mood normal.        Thought Content: Thought  content normal.        Judgment: Judgment normal.     Data: ABI Findings:  +---------+------------------+-----+---------+--------+  Right   Rt Pressure (mmHg)IndexWaveform Comment   +---------+------------------+-----+---------+--------+  Brachial 170                                       +---------+------------------+-----+---------+--------+  ATA     174               0.97 triphasic          +---------+------------------+-----+---------+--------+  PTA     160               0.89 biphasic           +---------+------------------+-----+---------+--------+  Great Toe104               0.58                    +---------+------------------+-----+---------+--------+   +---------+------------------+-----+----------+-------+  Left    Lt Pressure (mmHg)IndexWaveform  Comment  +---------+------------------+-----+----------+-------+  Brachial 179                                       +---------+------------------+-----+----------+-------+  ATA     154               0.86 triphasic          +---------+------------------+-----+----------+-------+  PTA     132               0.74 monophasic         +---------+------------------+-----+----------+-------+  Great Toe91                0.51                    +---------+------------------+-----+----------+-------+   +-------+-----------+-----------+------------+------------+  ABI/TBIToday's ABIToday's TBIPrevious ABIPrevious TBI  +-------+-----------+-----------+------------+------------+   Right 0.97       0.58       0.92        0.51          +-------+-----------+-----------+------------+------------+  Left  0.86       0.51       0.85        0.43          +-------+-----------+-----------+------------+------------+      Right ABIs and TBIs appear increased compared to prior study on 05/16/22.  Left ABIs appear essentially unchanged compared to prior study on 05/16/22.    Summary:  Right: Resting right ankle-brachial index is within normal range. The  right toe-brachial index is abnormal.   Left: Resting left ankle-brachial index indicates mild left lower  extremity arterial disease. The left toe-brachial index is abnormal.     Abdominal Aorta Findings:  +--------+-------+----------+----------+--------+--------+--------+  LocationAP (cm)Trans (cm)PSV (cm/s)WaveformThrombusComments  +--------+-------+----------+----------+--------+--------+--------+  Proximal                74                                  +--------+-------+----------+----------+--------+--------+--------+  Mid                     306                                 +--------+-------+----------+----------+--------+--------+--------+  Right Stent(s):  +---------------+--------+---------------+----------+------------------+  EIA           PSV cm/sStenosis       Waveform  Comments            +---------------+--------+---------------+----------+------------------+  Prox to Stent                                   Unable to insonate  +---------------+--------+---------------+----------+------------------+  Proximal Stent 216     50-99% stenosismonophasic                    +---------------+--------+---------------+----------+------------------+  Mid Stent      122                    biphasic                      +---------------+--------+---------------+----------+------------------+  Distal Stent   146                    biphasic                       +---------------+--------+---------------+----------+------------------+  Distal to Stent41                     biphasic                      +---------------+--------+---------------+----------+------------------+        +---------------+--------+--------+----------+------------------+  CIA           PSV cm/sStenosisWaveform  Comments            +---------------+--------+--------+----------+------------------+  Prox to Stent                            Unable to insonate  +---------------+--------+--------+----------+------------------+  Proximal Stent 94              biphasic                      +---------------+--------+--------+----------+------------------+  Mid Stent      118             biphasic                      +---------------+--------+--------+----------+------------------+  Distal Stent   113             biphasic                      +---------------+--------+--------+----------+------------------+  Distal to Stent123             monophasic                    +---------------+--------+--------+----------+------------------+      Left Stent(s):  +---------------+--------+---------------+--------+------------------+  CIA           PSV cm/sStenosis       WaveformComments            +---------------+--------+---------------+--------+------------------+  Prox to Stent  99                     biphasic                    +---------------+--------+---------------+--------+------------------+  Proximal Stent 134  biphasic                    +---------------+--------+---------------+--------+------------------+  Mid Stent      176                    biphasic                    +---------------+--------+---------------+--------+------------------+  Distal Stent   243     50-99% stenosisbiphasic                     +---------------+--------+---------------+--------+------------------+  Distal to Stent                               Unable to insonate  +---------------+--------+---------------+--------+------------------+     Summary:  Stenosis: +--------------------+-------------+---------------+  Location            Stenosis     Stent            +--------------------+-------------+---------------+  Mid Aorta           >50% stenosis                 +--------------------+-------------+---------------+  Right Common Iliac               1-49% stenosis   +--------------------+-------------+---------------+  Left Common Iliac                50-99% stenosis  +--------------------+-------------+---------------+  Right External Iliac             50-99% stenosis  +--------------------+-------------+---------------+        Assessment/Plan:     72 year old female with history of bilateral common iliac artery stenting.  There appears to be stenosis in both stents today with elevated velocities however the distal waveforms are triphasic down the legs and she has palpable anterior tibial pulses with resolution of symptoms.  As such we will follow her up in 6 months with repeat aortoiliac duplex and ABIs.  If her symptoms were to recur in that time we would need to see her sooner and consider repeat angiography.  She is on Plavix for now but certainly with recent need for transfusion this may need to be held at this point would be okay but would be beneficial if she could be on at least aspirin.    Maeola Harman MD [facility]

## 2023-01-17 ENCOUNTER — Encounter (HOSPITAL_COMMUNITY): Payer: Medicare Other

## 2023-01-18 ENCOUNTER — Encounter: Payer: Self-pay | Admitting: *Deleted

## 2023-01-22 ENCOUNTER — Encounter (HOSPITAL_COMMUNITY)
Admission: RE | Admit: 2023-01-22 | Discharge: 2023-01-22 | Disposition: A | Discharge status: Home / Self Care | Payer: Medicare Other | Source: Ambulatory Visit | Attending: Family Medicine | Admitting: Family Medicine

## 2023-01-22 ENCOUNTER — Encounter: Payer: Self-pay | Admitting: *Deleted

## 2023-01-22 DIAGNOSIS — J449 Chronic obstructive pulmonary disease, unspecified: Secondary | ICD-10-CM

## 2023-01-22 DIAGNOSIS — J9611 Chronic respiratory failure with hypoxia: Secondary | ICD-10-CM

## 2023-01-22 NOTE — Progress Notes (Signed)
Daily Session Note  Patient Details  Name: Desiree Barber MRN: [id number] Date of Birth: [birth year] Referring Provider:   Flowsheet Row PULMONARY REHAB OTHER RESP ORIENTATION from 08/22/2022 in [facility]  Referring Provider Dr. Sherene Sires       Encounter Date: 01/22/2023  Check In:  Session Check In - 01/22/23 1453       Check-In   Supervising physician immediately available to respond to emergencies See telemetry face sheet for immediately available MD    Location [facility]    Staff Present Ross Ludwig, BS, Exercise Physiologist;Hillary [place] BSN, RN;Thelma Viana [place], MA, RCEP, CCRP, CCET    Virtual Visit No    Medication changes reported     Yes    Comments Currenty taking antibiotic, had iron infusion    Fall or balance concerns reported    No    Warm-up and Cool-down Performed on first and last piece of equipment    Resistance Training Performed Yes    VAD Patient? No    PAD/SET Patient? No      Pain Assessment   Currently in Pain? No/denies             Capillary Blood Glucose: No results found for this or any previous visit (from the past 24 hour(s)).    Social History   Tobacco Use  Smoking Status Former   Current packs/day: 0.00   Average packs/day: 0.5 packs/day for 55.0 years (27.5 ttl pk-yrs)   Types: Cigarettes   Start date: 05/30/1966   Quit date: 05/29/2021   Years since quitting: 1.6   Passive exposure: Past  Smokeless Tobacco Former  Tobacco Comments   Quit date 05/29/2021    Goals Met:  Proper associated with RPD/PD & O2 Sat Independence with exercise equipment Using PLB without cueing & demonstrates good technique Exercise tolerated well No report of concerns or symptoms today Strength training completed today  Goals Unmet:  Not Applicable  Comments: Pt able to follow exercise prescription today without complaint.  Will continue to monitor for progression.

## 2023-01-24 ENCOUNTER — Encounter (HOSPITAL_COMMUNITY)
Admission: RE | Admit: 2023-01-24 | Discharge: 2023-01-24 | Disposition: A | Discharge status: Home / Self Care | Payer: Medicare Other | Source: Ambulatory Visit | Attending: Family Medicine | Admitting: Family Medicine

## 2023-01-24 ENCOUNTER — Other Ambulatory Visit: Payer: Self-pay | Admitting: Gastroenterology

## 2023-01-24 ENCOUNTER — Telehealth: Payer: Self-pay | Admitting: *Deleted

## 2023-01-24 DIAGNOSIS — J9611 Chronic respiratory failure with hypoxia: Secondary | ICD-10-CM

## 2023-01-24 DIAGNOSIS — J449 Chronic obstructive pulmonary disease, unspecified: Secondary | ICD-10-CM

## 2023-01-24 MED ORDER — FLUCONAZOLE 150 MG PO TABS: 150.0000 mg | ORAL_TABLET | Freq: Once | ORAL | 0 refills | Status: AC

## 2023-01-24 NOTE — Progress Notes (Signed)
Daily Session Note  Patient Details  Name: Desiree Barber MRN: [id number] Date of Birth: Oct 06, 1950 Referring Provider:   Flowsheet Row PULMONARY REHAB OTHER RESP ORIENTATION from 08/22/2022 in [place] [facility]  Referring Provider Dr. Sherene Sires       Encounter Date: 01/24/2023  Check In:  Session Check In - 01/24/23 1430       Check-In   Supervising physician immediately available to respond to emergencies See telemetry face sheet for immediately available MD    Location [facility]    Staff Present Ross Ludwig, BS, Exercise Physiologist;Jessica [place], MA, RCEP, CCRP, Dow Adolph, RN, Film/video editor BSN, RN    Virtual Visit No    Medication changes reported     No    Fall or balance concerns reported    No    Tobacco Cessation No Change    Warm-up and Cool-down Performed on first and last piece of equipment    Resistance Training Performed Yes    VAD Patient? No    PAD/SET Patient? No      Pain Assessment   Currently in Pain? No/denies    Pain Score 0-No pain    Multiple Pain Sites No             Capillary Blood Glucose: No results found for this or any previous visit (from the past 24 hour(s)).    Social History   Tobacco Use  Smoking Status Former   Current packs/day: 0.00   Average packs/day: 0.5 packs/day for 55.0 years (27.5 ttl pk-yrs)   Types: Cigarettes   Start date: 05/30/1966   Quit date: 05/29/2021   Years since quitting: 1.6   Passive exposure: Past  Smokeless Tobacco Former  Tobacco Comments   Quit date 05/29/2021    Goals Met:  Independence with exercise equipment Exercise tolerated well No report of concerns or symptoms today Strength training completed today  Goals Unmet:  Not Applicable  Comments: Pt able to follow exercise prescription today without complaint.  Will continue to monitor for progression.

## 2023-01-24 NOTE — Telephone Encounter (Signed)
Pt called and left a message stating she has a yeast infection from the amoxicillin. I have tried to call her back several times on all the numbers she has listed and sent her a MyChart message.

## 2023-01-25 NOTE — Telephone Encounter (Signed)
Noted  

## 2023-01-29 ENCOUNTER — Ambulatory Visit (HOSPITAL_COMMUNITY)
Admission: RE | Admit: 2023-01-29 | Discharge: 2023-01-29 | Disposition: A | Discharge status: Home / Self Care | Payer: Medicare Other | Attending: Internal Medicine | Admitting: Internal Medicine

## 2023-01-29 ENCOUNTER — Encounter (HOSPITAL_COMMUNITY): Payer: Medicare Other

## 2023-01-29 ENCOUNTER — Encounter (HOSPITAL_COMMUNITY)
Admission: RE | Disposition: A | Discharge status: Home / Self Care | Payer: Self-pay | Source: Home / Self Care | Attending: Internal Medicine

## 2023-01-29 DIAGNOSIS — K633 Ulcer of intestine: Secondary | ICD-10-CM | POA: Insufficient documentation

## 2023-01-29 DIAGNOSIS — Z7902 Long term (current) use of antithrombotics/antiplatelets: Secondary | ICD-10-CM | POA: Diagnosis not present

## 2023-01-29 DIAGNOSIS — D509 Iron deficiency anemia, unspecified: Secondary | ICD-10-CM | POA: Insufficient documentation

## 2023-01-29 DIAGNOSIS — K5520 Angiodysplasia of colon without hemorrhage: Secondary | ICD-10-CM | POA: Insufficient documentation

## 2023-01-29 HISTORY — PX: GIVENS CAPSULE STUDY: SHX5432

## 2023-01-29 SURGERY — IMAGING PROCEDURE, GI TRACT, INTRALUMINAL, VIA CAPSULE

## 2023-01-29 NOTE — H&P (Signed)
Patient presenting for capsule endoscopy for history of IDA

## 2023-01-30 ENCOUNTER — Encounter (HOSPITAL_COMMUNITY): Payer: Self-pay | Admitting: *Deleted

## 2023-01-30 DIAGNOSIS — J9611 Chronic respiratory failure with hypoxia: Secondary | ICD-10-CM

## 2023-01-30 DIAGNOSIS — J449 Chronic obstructive pulmonary disease, unspecified: Secondary | ICD-10-CM

## 2023-01-30 NOTE — Progress Notes (Signed)
Pulmonary Individual Treatment Plan  Patient Details  Name: CAROLEANN CASLER MRN: [id number] Date of Birth: 06-Nov-1950 Referring Provider:   Flowsheet Row PULMONARY REHAB OTHER RESP ORIENTATION from 08/22/2022 in [facility] CARDIAC REH[facility]ATION  Referring Provider Dr. Sherene Sires       Initial Encounter Date:  Flowsheet Row PULMONARY REHAB OTHER RESP ORIENTATION from 08/22/2022 in [facility]  Date 08/22/22       Visit Diagnosis: Chronic respiratory failure with hypoxia (HCC)  COPD mixed type (HCC)  Patient's Home Medications on Admission:  No current outpatient medications on file.  Past Medical History: Past Medical History:  Diagnosis Date   Anemia    Anxiety    Occasional   Arthritis    COPD (chronic obstructive pulmonary disease) (HCC)    HTN (hypertension)    Iron deficiency anemia due to chronic blood loss 03/28/2022   Peripheral arterial disease (HCC)     Tobacco Use: Social History   Tobacco Use  Smoking Status Former   Current packs/day: 0.00   Average packs/day: 0.5 packs/day for 55.0 years (27.5 ttl pk-yrs)   Types: Cigarettes   Start date: 05/30/1966   Quit date: 05/29/2021   Years since quitting: 1.6   Passive exposure: Past  Smokeless Tobacco Former  Tobacco Comments   Quit date 05/29/2021    Labs: Review Flowsheet       Latest Ref Rng & Units 03/01/2022 04/10/2022  Labs for ITP Cardiac and Pulmonary Rehab  Cholestrol 0 - 200 mg/dL 295  -  LDL (calc) 0 - 99 mg/dL 65  -  HDL-C >28 mg/dL 77  -  Trlycerides <413 mg/dL 38  -  TCO2 22 - 32 mmol/L - 28     Details            Capillary Blood Glucose: No results found for: "GLUCAP"   Pulmonary Assessment Scores:  Pulmonary Assessment Scores     Row Name 08/22/22 1314         ADL UCSD   SOB Score total 34     Rest 0     Walk 3     Stairs 4     Bath 0     Dress 0     Shop 2       CAT Score   CAT Score 15       mMRC Score   mMRC Score 1              UCSD: Self-administered rating of dyspnea associated with activities of daily living (ADLs) 6-point scale (0 = "not at all" to 5 = "maximal or unable to do because of breathlessness")  Scoring Scores range from 0 to 120.  Minimally important difference is 5 units  CAT: CAT can identify the health impairment of COPD patients and is better correlated with disease progression.  CAT has a scoring range of zero to 40. The CAT score is classified into four groups of low (less than 10), medium (10 - 20), high (21-30) and very high (31-40) based on the impact level of disease on health status. A CAT score over 10 suggests significant symptoms.  A worsening CAT score could be explained by an exacerbation, poor medication adherence, poor inhaler technique, or progression of COPD or comorbid conditions.  CAT MCID is 2 points  mMRC: mMRC (Modified Medical Research Council) Dyspnea Scale is used to assess the degree of baseline functional disability in patients of respiratory disease due to dyspnea. No minimal important  difference is established. A decrease in score of 1 point or greater is considered a positive change.   Pulmonary Function Assessment:   Exercise Target Goals: Exercise Program Goal: Individual exercise prescription set using results from initial 6 min walk test and THRR while considering  patient's activity barriers and safety.   Exercise Prescription Goal: Initial exercise prescription builds to 30-45 minutes a day of aerobic activity, 2-3 days per week.  Home exercise guidelines will be given to patient during program as part of exercise prescription that the participant will acknowledge.  Activity Barriers & Risk Stratification:  Activity Barriers & Cardiac Risk Stratification - 08/22/22 1322       Activity Barriers & Cardiac Risk Stratification   Activity Barriers Arthritis;Back Problems;Neck/Spine Problems;Joint Problems;Deconditioning;Muscular Weakness;Shortness of Breath     Cardiac Risk Stratification High             6 Minute Walk:  6 Minute Walk     Row Name 08/22/22 1432         6 Minute Walk   Phase Initial     Distance 800 feet     Walk Time 6 minutes     # of Rest Breaks 1     MPH 1.5     METS 2.38     RPE 13     Perceived Dyspnea  13     VO2 Peak 8.33     Symptoms Yes (comment)     Comments pt needed one sittting break due to SOB     Resting HR 81 bpm     Resting BP 134/60     Resting Oxygen Saturation  92 %     Exercise Oxygen Saturation  during 6 min walk 80 %     Max Ex. HR 109 bpm     Max Ex. BP 140/68     2 Minute Post BP 136/60       Interval HR   1 Minute HR 93     2 Minute HR 104     3 Minute HR 103     4 Minute HR 109     5 Minute HR 103     6 Minute HR 105     2 Minute Post HR 91     Interval Heart Rate? Yes       Interval Oxygen   Interval Oxygen? Yes     Baseline Oxygen Saturation % 92 %     1 Minute Oxygen Saturation % 90 %     1 Minute Liters of Oxygen 0 L     2 Minute Oxygen Saturation % 85 %     2 Minute Liters of Oxygen 0 L     3 Minute Oxygen Saturation % 88 %     3 Minute Liters of Oxygen 0 L     4 Minute Oxygen Saturation % 83 %     5 Minute Oxygen Saturation % 81 %     6 Minute Oxygen Saturation % 80 %     6 Minute Liters of Oxygen 0 L     2 Minute Post Oxygen Saturation % 92 %     2 Minute Post Liters of Oxygen 0 L              Oxygen Initial Assessment:  Oxygen Initial Assessment - 08/22/22 1437       Initial 6 min Walk   Oxygen Used None      Program Oxygen Prescription  Program Oxygen Prescription None   pt did not have accuret reading due to nail polish, will be using forhead sensor if nail polish isnt removed            Oxygen Re-Evaluation:  Oxygen Re-Evaluation     Row Name 10/09/22 1544 10/18/22 1522 10/26/22 1239 11/22/22 1510 12/13/22 1539     Program Oxygen Prescription   Program Oxygen Prescription None None -- None None     Home Oxygen   Home Oxygen  Device Portable Concentrator Portable Concentrator -- Portable Concentrator Portable Concentrator   Sleep Oxygen Prescription None None -- None None   Home Exercise Oxygen Prescription Pulsed Pulsed -- None None   Liters per minute 2 2 -- -- --   Home Resting Oxygen Prescription None None -- Pulsed  Pt wears her 02 15-20 min a day when she is on her computer Pulsed   Liters per minute -- -- -- 2 --   Compliance with Home Oxygen Use -- Yes -- Yes Yes     Goals/Expected Outcomes   Short Term Goals To learn and exhibit compliance with exercise, home and travel O2 prescription;To learn and understand importance of monitoring SPO2 with pulse oximeter and demonstrate accurate use of the pulse oximeter.;To learn and understand importance of maintaining oxygen saturations>88%;To learn and demonstrate proper pursed lip breathing techniques or other breathing techniques. ;To learn and demonstrate proper use of respiratory medications To learn and exhibit compliance with exercise, home and travel O2 prescription;To learn and understand importance of monitoring SPO2 with pulse oximeter and demonstrate accurate use of the pulse oximeter.;To learn and understand importance of maintaining oxygen saturations>88%;To learn and demonstrate proper pursed lip breathing techniques or other breathing techniques. ;To learn and demonstrate proper use of respiratory medications To learn and demonstrate proper pursed lip breathing techniques or other breathing techniques.  To learn and exhibit compliance with exercise, home and travel O2 prescription;To learn and understand importance of monitoring SPO2 with pulse oximeter and demonstrate accurate use of the pulse oximeter.;To learn and understand importance of maintaining oxygen saturations>88%;To learn and demonstrate proper pursed lip breathing techniques or other breathing techniques. ;To learn and demonstrate proper use of respiratory medications To learn and exhibit compliance  with exercise, home and travel O2 prescription;To learn and understand importance of monitoring SPO2 with pulse oximeter and demonstrate accurate use of the pulse oximeter.;To learn and understand importance of maintaining oxygen saturations>88%;To learn and demonstrate proper pursed lip breathing techniques or other breathing techniques. ;To learn and demonstrate proper use of respiratory medications   Long  Term Goals Compliance with respiratory medication;Exhibits proper breathing techniques, such as pursed lip breathing or other method taught during program session;Maintenance of O2 saturations>88%;Verbalizes importance of monitoring SPO2 with pulse oximeter and return demonstration;Exhibits compliance with exercise, home  and travel O2 prescription Compliance with respiratory medication;Exhibits proper breathing techniques, such as pursed lip breathing or other method taught during program session;Maintenance of O2 saturations>88%;Verbalizes importance of monitoring SPO2 with pulse oximeter and return demonstration;Exhibits compliance with exercise, home  and travel O2 prescription Exhibits proper breathing techniques, such as pursed lip breathing or other method taught during program session Exhibits compliance with exercise, home  and travel O2 prescription;Verbalizes importance of monitoring SPO2 with pulse oximeter and return demonstration;Maintenance of O2 saturations>88%;Exhibits proper breathing techniques, such as pursed lip breathing or other method taught during program session;Compliance with respiratory medication;Demonstrates proper use of MDI's Exhibits compliance with exercise, home  and travel O2 prescription;Verbalizes importance of monitoring SPO2  with pulse oximeter and return demonstration;Maintenance of O2 saturations>88%;Exhibits proper breathing techniques, such as pursed lip breathing or other method taught during program session;Compliance with respiratory medication;Demonstrates  proper use of MDI's   Comments -- Micah Flesher to Dr to make sure conentrator was good and aproval to take on airplane for trip to [place]. Asked her about her pursed lip breathing when needed ans she stated that she didnt use it but also sounded that she did not fully understand. Her O2 states are maintaning above 88% and knows how to take her SpO2 with portable pulse ox. Diaphragmatic and PLB breathing explained and performed with patient. Patient has a better understanding of how to do these exercises to help with breathing performance and relaxation. Patient performed breathing techniques adequately and to practice further at home. Pt does not wear 02 during exercise.  She states that her MD told her to wear 2L 02 at home while she is resting for 15-20 min a day.  She has a pulse ox at home and understands that her oxygen level needs to be above 88%.  She has an albuterol inhaler as needed but has not had to use it. Adyson does not wear oxygen during exercise at rehab and her oxygen stats have been WNL. She does have oxygen at home but does not need to wear it at home. She stated that she will wear it when she feels very SOB. She has albuterol inhaler and does use it as needed.   Goals/Expected Outcomes -- short term goal: reminders about PLB   Long term goal: continue to monitor stats at home and on trip. Short: practice PLB and diaphragmatic breathing at home. Long: Use PLB and diaphragmatic breathing independently Short term:  Continue to wear her oxygen as prescribed and practice PLB   Long term:  Use albuterol as needed and continue PLB at home Short term:  Continue to wear her oxygen as prescribed and practice PLB   Long term:  Use albuterol as needed and continue PLB at home    Row Name 01/22/23 1520             Program Oxygen Prescription   Program Oxygen Prescription None         Home Oxygen   Home Oxygen Device Portable Concentrator       Sleep Oxygen Prescription None       Home Exercise  Oxygen Prescription None       Compliance with Home Oxygen Use Yes         Goals/Expected Outcomes   Long  Term Goals Exhibits compliance with exercise, home  and travel O2 prescription;Verbalizes importance of monitoring SPO2 with pulse oximeter and return demonstration;Maintenance of O2 saturations>88%;Exhibits proper breathing techniques, such as pursed lip breathing or other method taught during program session;Compliance with respiratory medication;Demonstrates proper use of MDI's       Comments Jamar does not wear oxygen during exercise at rehab and her oxygen stats have been WNL. She does have oxygen at home but does not need to wear it at home. She stated that she will wear it when she feels very SOB. She has albuterol inhaler and does use it as needed.       Goals/Expected Outcomes Short term:  Continue to wear her oxygen as prescribed and practice PLB   Long term:  Use albuterol as needed and continue PLB at home                Oxygen Discharge (  Final Oxygen Re-Evaluation):  Oxygen Re-Evaluation - 01/22/23 1520       Program Oxygen Prescription   Program Oxygen Prescription None      Home Oxygen   Home Oxygen Device Portable Concentrator    Sleep Oxygen Prescription None    Home Exercise Oxygen Prescription None    Compliance with Home Oxygen Use Yes      Goals/Expected Outcomes   Long  Term Goals Exhibits compliance with exercise, home  and travel O2 prescription;Verbalizes importance of monitoring SPO2 with pulse oximeter and return demonstration;Maintenance of O2 saturations>88%;Exhibits proper breathing techniques, such as pursed lip breathing or other method taught during program session;Compliance with respiratory medication;Demonstrates proper use of MDI's    Comments Keya does not wear oxygen during exercise at rehab and her oxygen stats have been WNL. She does have oxygen at home but does not need to wear it at home. She stated that she will wear it when she feels  very SOB. She has albuterol inhaler and does use it as needed.    Goals/Expected Outcomes Short term:  Continue to wear her oxygen as prescribed and practice PLB   Long term:  Use albuterol as needed and continue PLB at home             Initial Exercise Prescription:  Initial Exercise Prescription - 08/22/22 1400       Date of Initial Exercise RX and Referring Provider   Date 08/22/22    Referring Provider Dr. Sherene Sires    Expected Discharge Date 01/03/23      Treadmill   MPH 1.3    Grade 0    Minutes 17      NuStep   Level 1    SPM 60    Minutes 22      Prescription Details   Frequency (times per week) 2    Duration Progress to 30 minutes of continuous aerobic without signs/symptoms of physical distress      Intensity   THRR 40-80% of Max Heartrate 60-119    Ratings of Perceived Exertion 11-13    Perceived Dyspnea 0-4      Resistance Training   Training Prescription Yes    Weight 3    Reps 10-15             Perform Capillary Blood Glucose checks as needed.  Exercise Prescription Changes:   Exercise Prescription Changes     Row Name 08/28/22 1500 09/11/22 1500 09/25/22 1500 10/02/22 1500 10/23/22 1500     Response to Exercise   Blood Pressure (Admit) 112/58 120/60 120/60 112/62 130/60   Blood Pressure (Exercise) 126/60 110/58 130/60 124/74 130/60   Blood Pressure (Exit) 110/60 120/68 142/62 116/50 126/60   Heart Rate (Admit) 83 bpm 78 bpm 80 bpm 87 bpm 89 bpm   Heart Rate (Exercise) 86 bpm 95 bpm 100 bpm 96 bpm 100 bpm   Heart Rate (Exit) 85 bpm 77 bpm 85 bpm 81 bpm 90 bpm   Oxygen Saturation (Admit) 94 % 95 % 94 % 92 % 89 %   Oxygen Saturation (Exercise) 92 % 95 % 93 % 90 % 92 %   Oxygen Saturation (Exit) 95 % 96 % 94 % 94 % 93 %   Rating of Perceived Exertion (Exercise) 12 12 12 12 13    Perceived Dyspnea (Exercise) 12 12 12  0 1   Duration Continue with 30 min of aerobic exercise without signs/symptoms of physical distress. Continue with 30 min of  aerobic exercise  without signs/symptoms of physical distress. Continue with 30 min of aerobic exercise without signs/symptoms of physical distress. Continue with 30 min of aerobic exercise without signs/symptoms of physical distress. Continue with 30 min of aerobic exercise without signs/symptoms of physical distress.   Intensity THRR unchanged THRR unchanged THRR unchanged THRR unchanged THRR unchanged     Progression   Progression Continue to progress workloads to maintain intensity without signs/symptoms of physical distress. Continue to progress workloads to maintain intensity without signs/symptoms of physical distress. Continue to progress workloads to maintain intensity without signs/symptoms of physical distress. Continue to progress workloads to maintain intensity without signs/symptoms of physical distress. Continue to progress workloads to maintain intensity without signs/symptoms of physical distress.     Resistance Training   Training Prescription Yes Yes Yes Yes Yes   Weight 2 2 3 3 4    Reps 10-15 10-15 10-15 10-15 10-15   Time 10 Minutes 10 Minutes 10 Minutes -- --     Treadmill   MPH 1.3 1.5 1.6 1.5 1.5   Grade 0 0 0 0 0   Minutes 17 17 17 15 15    METs 2 2.15 2.23 2.15 2.15     NuStep   Level 1 2 2 2 2    SPM 40 79 67 55 82   Minutes 22 22 22 15 15    METs 1.7 1.9 1.8 1.6 1.9     Oxygen   Maintain Oxygen Saturation -- -- -- 88% or higher 88% or higher    Row Name 11/22/22 1500 12/25/22 1300 01/24/23 1500         Response to Exercise   Blood Pressure (Admit) 118/64 104/64 124/62     Blood Pressure (Exit) 112/60 120/60 102/62     Heart Rate (Admit) 90 bpm 95 bpm 90 bpm     Heart Rate (Exercise) 116 bpm 88 bpm 91 bpm     Heart Rate (Exit) 88 bpm 78 bpm 95 bpm     Oxygen Saturation (Admit) 90 % 95 % 94 %     Oxygen Saturation (Exercise) 92 % 94 % 91 %     Oxygen Saturation (Exit) 95 % 96 % 95 %     Rating of Perceived Exertion (Exercise) 12 12 12      Perceived  Dyspnea (Exercise) 1 2 0     Duration Continue with 30 min of aerobic exercise without signs/symptoms of physical distress. Continue with 30 min of aerobic exercise without signs/symptoms of physical distress. Continue with 30 min of aerobic exercise without signs/symptoms of physical distress.     Intensity THRR unchanged THRR unchanged THRR unchanged       Progression   Progression Continue to progress workloads to maintain intensity without signs/symptoms of physical distress. Continue to progress workloads to maintain intensity without signs/symptoms of physical distress. Continue to progress workloads to maintain intensity without signs/symptoms of physical distress.       Resistance Training   Training Prescription Yes Yes Yes     Weight 4 / green band 4 lbs / green band 4 lbs / green band     Reps 10-15 10-15 10-15       Treadmill   MPH 1.7 -- --     Grade 0 -- --     Minutes 15 -- --     METs 2.3 -- --       NuStep   Level 3 2 1      SPM 63 47 79  Minutes 15 30 30      METs 1.6 1.5 1.6       Oxygen   Maintain Oxygen Saturation 88% or higher 88% or higher 88% or higher              Exercise Comments:   Exercise Goals and Review:   Exercise Goals     Row Name 08/22/22 1435 09/10/22 1412 10/09/22 1540         Exercise Goals   Increase Physical Activity Yes Yes Yes     Intervention Provide advice, education, support and counseling about physical activity/exercise needs.;Develop an individualized exercise prescription for aerobic and resistive training based on initial evaluation findings, risk stratification, comorbidities and participant's personal goals. Provide advice, education, support and counseling about physical activity/exercise needs.;Develop an individualized exercise prescription for aerobic and resistive training based on initial evaluation findings, risk stratification, comorbidities and participant's personal goals. Provide advice, education, support  and counseling about physical activity/exercise needs.;Develop an individualized exercise prescription for aerobic and resistive training based on initial evaluation findings, risk stratification, comorbidities and participant's personal goals.     Expected Outcomes Short Term: Attend rehab on a regular basis to increase amount of physical activity.;Long Term: Exercising regularly at least 3-5 days a week.;Long Term: Add in home exercise to make exercise part of routine and to increase amount of physical activity. Short Term: Attend rehab on a regular basis to increase amount of physical activity.;Long Term: Exercising regularly at least 3-5 days a week.;Long Term: Add in home exercise to make exercise part of routine and to increase amount of physical activity. Short Term: Attend rehab on a regular basis to increase amount of physical activity.;Long Term: Exercising regularly at least 3-5 days a week.;Long Term: Add in home exercise to make exercise part of routine and to increase amount of physical activity.     Increase Strength and Stamina Yes Yes Yes     Intervention Provide advice, education, support and counseling about physical activity/exercise needs.;Develop an individualized exercise prescription for aerobic and resistive training based on initial evaluation findings, risk stratification, comorbidities and participant's personal goals. Provide advice, education, support and counseling about physical activity/exercise needs.;Develop an individualized exercise prescription for aerobic and resistive training based on initial evaluation findings, risk stratification, comorbidities and participant's personal goals. Provide advice, education, support and counseling about physical activity/exercise needs.;Develop an individualized exercise prescription for aerobic and resistive training based on initial evaluation findings, risk stratification, comorbidities and participant's personal goals.     Expected  Outcomes Short Term: Increase workloads from initial exercise prescription for resistance, speed, and METs.;Short Term: Perform resistance training exercises routinely during rehab and add in resistance training at home;Long Term: Improve cardiorespiratory fitness, muscular endurance and strength as measured by increased METs and functional capacity ( ) Short Term: Increase workloads from initial exercise prescription for resistance, speed, and METs.;Short Term: Perform resistance training exercises routinely during rehab and add in resistance training at home;Long Term: Improve cardiorespiratory fitness, muscular endurance and strength as measured by increased METs and functional capacity ( ) Short Term: Increase workloads from initial exercise prescription for resistance, speed, and METs.;Short Term: Perform resistance training exercises routinely during rehab and add in resistance training at home;Long Term: Improve cardiorespiratory fitness, muscular endurance and strength as measured by increased METs and functional capacity ( )     Able to understand and use rate of perceived exertion (RPE) scale Yes Yes Yes     Intervention Provide education and explanation on how to use RPE scale  Provide education and explanation on how to use RPE scale Provide education and explanation on how to use RPE scale     Expected Outcomes Short Term: Able to use RPE daily in rehab to express subjective intensity level;Long Term:  Able to use RPE to guide intensity level when exercising independently Short Term: Able to use RPE daily in rehab to express subjective intensity level;Long Term:  Able to use RPE to guide intensity level when exercising independently Short Term: Able to use RPE daily in rehab to express subjective intensity level;Long Term:  Able to use RPE to guide intensity level when exercising independently     Able to understand and use Dyspnea scale Yes Yes Yes     Intervention Provide education and  explanation on how to use Dyspnea scale Provide education and explanation on how to use Dyspnea scale Provide education and explanation on how to use Dyspnea scale     Expected Outcomes Short Term: Able to use Dyspnea scale daily in rehab to express subjective sense of shortness of breath during exertion;Long Term: Able to use Dyspnea scale to guide intensity level when exercising independently Short Term: Able to use Dyspnea scale daily in rehab to express subjective sense of shortness of breath during exertion;Long Term: Able to use Dyspnea scale to guide intensity level when exercising independently Short Term: Able to use Dyspnea scale daily in rehab to express subjective sense of shortness of breath during exertion;Long Term: Able to use Dyspnea scale to guide intensity level when exercising independently     Knowledge and understanding of Target Heart Rate Range (THRR) Yes Yes Yes     Intervention Provide education and explanation of THRR including how the numbers were predicted and where they are located for reference Provide education and explanation of THRR including how the numbers were predicted and where they are located for reference Provide education and explanation of THRR including how the numbers were predicted and where they are located for reference     Expected Outcomes Short Term: Able to state/look up THRR;Long Term: Able to use THRR to govern intensity when exercising independently;Short Term: Able to use daily as guideline for intensity in rehab Short Term: Able to state/look up THRR;Long Term: Able to use THRR to govern intensity when exercising independently;Short Term: Able to use daily as guideline for intensity in rehab Short Term: Able to state/look up THRR;Long Term: Able to use THRR to govern intensity when exercising independently;Short Term: Able to use daily as guideline for intensity in rehab     Understanding of Exercise Prescription Yes Yes Yes     Intervention Provide  education, explanation, and written materials on patient's individual exercise prescription Provide education, explanation, and written materials on patient's individual exercise prescription Provide education, explanation, and written materials on patient's individual exercise prescription     Expected Outcomes Short Term: Able to explain program exercise prescription;Long Term: Able to explain home exercise prescription to exercise independently Short Term: Able to explain program exercise prescription;Long Term: Able to explain home exercise prescription to exercise independently Short Term: Able to explain program exercise prescription;Long Term: Able to explain home exercise prescription to exercise independently              Exercise Goals Re-Evaluation :  Exercise Goals Re-Evaluation     Row Name 09/10/22 1413 10/09/22 1541 10/18/22 1501 10/24/22 1351 11/13/22 0915     Exercise Goal Re-Evaluation   Exercise Goals Review Increase Physical Activity;Increase Strength and Stamina;Able to understand  and use rate of perceived exertion (RPE) scale;Able to understand and use Dyspnea scale;Knowledge and understanding of Target Heart Rate Range (THRR);Understanding of Exercise Prescription Increase Physical Activity;Increase Strength and Stamina;Able to understand and use rate of perceived exertion (RPE) scale;Able to understand and use Dyspnea scale;Knowledge and understanding of Target Heart Rate Range (THRR);Understanding of Exercise Prescription Increase Physical Activity;Increase Strength and Stamina;Understanding of Exercise Prescription -- Increase Physical Activity;Understanding of Exercise Prescription;Increase Strength and Stamina   Comments Pt has completed 4 sessions of pulmonary rehab. She is very motivated to be in the program and to improve her health. She is tolerating exercise and increasing her workload. She sometimes has to use the head O2 band due to her nail polish and the meter not  reading properly. She is currently exercising at 2.0 METs on the treadmill. Will continue to monitor and progress as able. Pt has completed 10 sessions of pulmonary rehab. She continues to be motivated to be in the program and is enjoying coming to class and exercising. She is increasing her workloads. She is currently exercising at 2.15 METs on the treadmill. Will continue to monitor and progress as able. Pt has complete 12 visits of PR. Pt states that she has seen improvment in her stamina and strgenth since starting the program. She is able to walk furthur without taking extra breaks due to SOB. Kaitlyn has increased her SPM on the NuStep. She keeps her speed at 1.5 on the treadmill while RPE is an 13. WIll continue to monitor and progress as able Sherrelle has been   Expected Outcomes Through exercise at rehab and home, patient will achieve their goals. Through exercise at rehab and home, patient will achieve their goals. Short term : go over home exercise  long term: keep improving workload and building endurance -- --    Row Name 11/22/22 1504 11/27/22 1429 12/13/22 1508 12/26/22 1343 01/22/23 1455     Exercise Goal Re-Evaluation   Exercise Goals Review Increase Physical Activity;Increase Strength and Stamina;Understanding of Exercise Prescription Increase Physical Activity;Able to understand and use rate of perceived exertion (RPE) scale;Increase Strength and Stamina;Able to understand and use Dyspnea scale;Understanding of Exercise Prescription Increase Physical Activity;Increase Strength and Stamina;Understanding of Exercise Prescription Increase Physical Activity;Increase Strength and Stamina;Understanding of Exercise Prescription Increase Physical Activity;Increase Strength and Stamina;Understanding of Exercise Prescription   Comments Pt feels like she has gotten stronger and has more endurance since starting PR.  Pt has increased her speed to 1.7 on the treadmill and she is going to try to increase  to level 3 on the Nustep today.  She is currently walking 2 days a week at home for about 10 min. Georgenia has been doing great with exericse. She has increased her speed on the treadmill to 1.7 and her level on the stepper to 3.0. Will continue to monitor and progress asa able Phynix has been doing great with exercise. She has increased her level on the nustep to level 4. She went to see her pulmonologist and he wanted her to increase her workloads at rehab. She has been having increased weakness/cramping in her legs when walking on the treadmill. She is going to see her vasicular doctor in the middle of October about the pain. She has been using the nustep twice due to pain walking on the treadmill. Kaylie has been tolerating exercise well. She has been dealing with pain in her legs when walking on the treadmill so she uses the nustep twice. She is going to see  a Vascular doctor about her circulation within the next few weeks.She is doing well on the low impact equipment like the stepper. Will continue to monitor and progress as able. Amador Cunas has been doing well in rehab. She was dealing with pain in her legs when walking and feeling tired recently. She has her hemoglobin checked and was sent for 2 units of blood. Since then she stated that she has felt a whole lot better and has not been getting tired as fast. She was able to walk on the treadmill today for about 10 minutes and then sat down. She is getting her strength back up.   Expected Outcomes Short term:  Pt will try to increase to 15-20 min each time she walks at home         Long term:  Pt will continue to build on her walking at home and continue to come to PR Short term: increase walking grade to 0.5 - 1.0   long term: continue to attend PR Short term: continue to exercise on nustep and monitor leg cramping   long term: continue to attened pulmoanry rehab Short term: increase workload on the stepper in the next two weeks    long term: continue to  attend rehab Short term: continue to slowy increase walking to build strength back up   long term: continue to attend rehab            Discharge Exercise Prescription (Final Exercise Prescription Changes):  Exercise Prescription Changes - 01/24/23 1500       Response to Exercise   Blood Pressure (Admit) 124/62    Blood Pressure (Exit) 102/62    Heart Rate (Admit) 90 bpm    Heart Rate (Exercise) 91 bpm    Heart Rate (Exit) 95 bpm    Oxygen Saturation (Admit) 94 %    Oxygen Saturation (Exercise) 91 %    Oxygen Saturation (Exit) 95 %    Rating of Perceived Exertion (Exercise) 12    Perceived Dyspnea (Exercise) 0    Duration Continue with 30 min of aerobic exercise without signs/symptoms of physical distress.    Intensity THRR unchanged      Progression   Progression Continue to progress workloads to maintain intensity without signs/symptoms of physical distress.      Resistance Training   Training Prescription Yes    Weight 4 lbs / green band    Reps 10-15      NuStep   Level 1    SPM 79    Minutes 30    METs 1.6      Oxygen   Maintain Oxygen Saturation 88% or higher             Nutrition:  Target Goals: Understanding of nutrition guidelines, daily intake of sodium 1500mg , cholesterol 200mg , calories 30% from fat and 7% or less from saturated fats, daily to have 5 or more servings of fruits and vegetables.  Biometrics:  Pre Biometrics - 08/22/22 1436       Pre Biometrics   Height 5\' 4"  (1.626 m)    Weight 126 lb 15.8 oz (57.6 kg)    Waist Circumference 33 inches    Hip Circumference 37 inches    Waist to Hip Ratio 0.89 %    BMI (Calculated) 21.79    Triceps Skinfold 29 mm    % Body Fat 35.7 %    Grip Strength 21.2 kg    Flexibility 0 in    Single Leg Stand 10.43  seconds              Nutrition Therapy Plan and Nutrition Goals:  Nutrition Therapy & Goals - 10/01/22 1419       Nutrition Therapy   RD appointment deferred Yes      Personal  Nutrition Goals   Comments We provide educational sessions on heart healthy nutrition with handouts.      Intervention Plan   Intervention Nutrition handout(s) given to patient.    Expected Outcomes Short Term Goal: Understand basic principles of dietary content, such as calories, fat, sodium, cholesterol and nutrients.             Nutrition Assessments:  Nutrition Assessments - 08/22/22 1336       MEDFICTS Scores   Pre Score 39            MEDIFICTS Score Key: >=70 Need to make dietary changes  40-70 Heart Healthy Diet <= 40 Therapeutic Level Cholesterol Diet   Picture Your Plate Scores: <16 Unhealthy dietary pattern with much room for improvement. 41-50 Dietary pattern unlikely to meet recommendations for good health and room for improvement. 51-60 More healthful dietary pattern, with some room for improvement.  >60 Healthy dietary pattern, although there may be some specific behaviors that could be improved.    Nutrition Goals Re-Evaluation:  Nutrition Goals Re-Evaluation     Row Name 10/18/22 1509 11/22/22 1526 12/13/22 1521 01/22/23 1515       Goals   Nutrition Goal Healthy Eating -- Healthy Eating Healthy Eating    Comment States that she loves to eat ice cream and eats muiltiple times a week. Eats more chicken and fish then red meat. Pts wants to maintain weight and is fine with how she eats. Eats a good amount of veggies. Pt does not eat much red meat, because she prefers chicken and fish.  She states that the chicken she eats is grilled but the fish is fried.  Pt does not each much fruit but does eat a lot of vegetables.  She loves to eat sweets and eats something sweet every day. Jennfer has been watching her weight but has been gaining weight. She was recently taken off Trelegy and put on Anoro to be taken off the predison that is in Trelegy. She continues to eat more baked chicken and veggies for her meals. She has cut down on the amount of sweets that she  eats. Priyanka has been watching her wight and has been staying around 125-130 lbs. She continues to eat backed chicken and veggies for her meals. She continues to work on her portion control. She has cut down on her sweets that she eats within the week    Expected Outcome Short term goal:cut down on sweets to maintain wright    Long term goal : maintain healthy eat and weight Short term:  Pt will try to cut down on her sweets to 5 days a week vs 7  Long term:  Pt will try grilled/baked seafood to try to maintain a healthy balance of foods Short term: continue to watch amount of sweets   long term: continue to focus on healthy eating Short term: continue to watch amount of sweets   long term: continue to focus on healthy eating             Nutrition Goals Discharge (Final Nutrition Goals Re-Evaluation):  Nutrition Goals Re-Evaluation - 01/22/23 1515       Goals   Nutrition Goal Healthy Eating  Comment Lillionna has been watching her wight and has been staying around 125-130 lbs. She continues to eat backed chicken and veggies for her meals. She continues to work on her portion control. She has cut down on her sweets that she eats within the week    Expected Outcome Short term: continue to watch amount of sweets   long term: continue to focus on healthy eating             Psychosocial: Target Goals: Acknowledge presence or absence of significant depression and/or stress, maximize coping skills, provide positive support system. Participant is able to verbalize types and ability to use techniques and skills needed for reducing stress and depression.  Initial Review & Psychosocial Screening:  Initial Psych Review & Screening - 08/22/22 1328       Initial Review   Current issues with Current Sleep Concerns;Current Anxiety/Panic      Family Dynamics   Good Support System? Yes    Comments Pt has a good support sytem with her husband, her daughters, and her grandchildren.      Barriers    Psychosocial barriers to participate in program There are no identifiable barriers or psychosocial needs.      Screening Interventions   Interventions Encouraged to exercise;Provide feedback about the scores to participant;To provide support and resources with identified psychosocial needs    Expected Outcomes Short Term goal: Utilizing psychosocial counselor, staff and physician to assist with identification of specific Stressors or current issues interfering with healing process. Setting desired goal for each stressor or current issue identified.;Long Term Goal: Stressors or current issues are controlled or eliminated.;Short Term goal: Identification and review with participant of any Quality of Life or Depression concerns found by scoring the questionnaire.;Long Term goal: The participant improves quality of Life and PHQ9 Scores as seen by post scores and/or verbalization of changes             Quality of Life Scores:  Quality of Life - 08/22/22 1450       Quality of Life   Select Quality of Life      Quality of Life Scores   Health/Function Pre 22.53 %    Socioeconomic Pre 29.17 %    Psych/Spiritual Pre 29.64 %    Family Pre 28.8 %    GLOBAL Pre 26.09 %            Scores of 19 and below usually indicate a poorer quality of life in these areas.  A difference of  2-3 points is a clinically meaningful difference.  A difference of 2-3 points in the total score of the Quality of Life Index has been associated with significant improvement in overall quality of life, self-image, physical symptoms, and general health in studies assessing change in quality of life.   PHQ-9: Review Flowsheet       08/22/2022  Depression screen PHQ 2/9  Decreased Interest 0  Down, Depressed, Hopeless 0  PHQ - 2 Score 0  Altered sleeping 1  Tired, decreased energy 0  Change in appetite 0  Feeling bad or failure about yourself  0  Trouble concentrating 0  Moving slowly or fidgety/restless 0   Suicidal thoughts 0  PHQ-9 Score 1  Difficult doing work/chores Not difficult at all    Details           Interpretation of Total Score  Total Score Depression Severity:  1-4 = Minimal depression, 5-9 = Mild depression, 10-14 = Moderate depression, 15-19 =  Moderately severe depression, 20-27 = Severe depression   Psychosocial Evaluation and Intervention:  Psychosocial Evaluation - 08/22/22 1419       Psychosocial Evaluation & Interventions   Interventions Encouraged to exercise with the program and follow exercise prescription;Stress management education;Relaxation education    Comments Pt has no identifiable psychosocial barriers to completing the PR program.  She is excited to start the program and improve her overall health.  She has a good support system in her husband, daughters, and grandchildren.  She scored a 1 on her PHQ-9 related to the fact that she has trouble falling asleep at times, but the pt does take a Xanax at night as needed to help her sleep and reduce anxiety.  Right now the pt feels like the Xanax is controlling her anxiety and insomnia.  She quit smoking in March of 2023 after smoking for 55 years.  She does have some lower back pain related to arthritis, and she is still having "heaviness" in her legs.  She is able to manage her back pain with Tylenol and stretching, and she hopes that once she loses some weight the leg "heaviness" will subside.  Her goals for the program are to breathe better with less shortness of breath and be able to walk/shop more.  We will monitor her progress as she works toward meeting these goals.    Expected Outcomes Pt's anxiety/insomnia will continue to be managed with Xanax, and she will continue to have no idenfiable psychosocial issues.    Continue Psychosocial Services  No Follow up required             Psychosocial Re-Evaluation:  Psychosocial Re-Evaluation     Row Name 09/04/22 1155 10/01/22 1420 10/18/22 1506 11/22/22 1521  12/13/22 1518     Psychosocial Re-Evaluation   Current issues with Current Sleep Concerns Current Sleep Concerns Current Sleep Concerns Current Psychotropic Meds Current Psychotropic Meds;None Identified   Comments Patient is new to the program completing 2 sessions.  She is currently is being treated via PCP for insomnia with xanax prn.  Patient was referred to PR by Dr. 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  Her initial PHQ-9 score is 1 and her QOL score is 26.09%.  We will continue to monitor  her progress as she works toward meeting these goals. Patient has completed 9 sessions. Her insomina is managed with Xanax prn. He continues to have no psychosocial barriers identified. She enjoys the program and demonstrates an interest in improving her health. We will continue to monitor her progress. Pt has seen an improvemnt in her sleep patterns, being able to have a comforable sleep night. Has been able to decrease medication usage to sleep. Pt states that she is sleeping fine now.  She only takes Xanax "every once in a while."  She denies any problems with anxiety/depression. Zaida has been doing good in rehab. She continues to sleep better now and still only takes her Xanax "every once in a while". Her only worry lastly has been the increased cramping in her legs thats been bothering her. She has an upcoming appointment with her vasicular docotor in October.   Expected Outcomes Patient will have no psychosocial barriers identified at discharge. Patient will have no psychosocial barriers identified at discharge. Short term Goal: continue to improve sleep and decrease medication useage Long term Goals; Continue to have no barries identified Short term:   Continue to practice healthy sleeping habits   Long term:  Pt will continue to have no identifiable psychosocial barriers Short term: continue to monitor leg pain   long term: continue to have no psychosocial barriers and exercise for  happiness   Interventions Relaxation education;Encouraged to attend Pulmonary Rehabilitation for the exercise;Stress management education Relaxation education;Encouraged to attend Pulmonary Rehabilitation for the exercise;Stress management education Encouraged to attend Pulmonary Rehabilitation for the exercise;Relaxation education;Stress management education Stress management education;Relaxation education;Encouraged to attend Pulmonary Rehabilitation for the exercise Stress management education;Encouraged to attend Pulmonary Rehabilitation for the exercise;Relaxation education   Continue Psychosocial Services  No Follow up required No Follow up required Follow up required by staff Follow up required by staff Follow up required by staff    Row Name 01/22/23 1511             Psychosocial Re-Evaluation   Current issues with None Identified;Current Stress Concerns       Comments Lucyle was recently stressed due to needing 2 units of blood. Since then she has stated that shehas felt a whole lot better. Since then the past week she has not had any stressors.       Expected Outcomes Short term: continue to exercisce to build strength  Long termL conitnue to have no psycohosical barriers and exercise for happiness       Interventions Stress management education;Relaxation education;Encouraged to attend Pulmonary Rehabilitation for the exercise       Continue Psychosocial Services  Follow up required by staff                Psychosocial Discharge (Final Psychosocial Re-Evaluation):  Psychosocial Re-Evaluation - 01/22/23 1511       Psychosocial Re-Evaluation   Current issues with None Identified;Current Stress Concerns    Comments Krislyn was recently stressed due to needing 2 units of blood. Since then she has stated that shehas felt a whole lot better. Since then the past week she has not had any stressors.    Expected Outcomes Short term: continue to exercisce to build strength  Long termL  conitnue to have no psycohosical barriers and exercise for happiness    Interventions Stress management education;Relaxation education;Encouraged to attend Pulmonary Rehabilitation for the exercise    Continue Psychosocial Services  Follow up required by staff              Education: Education Goals: Education classes will be provided on a weekly basis, covering required topics. Participant will state understanding/return demonstration of topics presented.  Learning Barriers/Preferences:  Learning Barriers/Preferences - 08/22/22 1336       Learning Barriers/Preferences   Learning Barriers None    Learning Preferences Written Material             Education Topics: How Lungs Work and Diseases: - Discuss the anatomy of the lungs and diseases that can affect the lungs, such as COPD. Flowsheet Row PULMONARY REHAB OTHER RESPIRATORY from 11/29/2022 in [place] [facility]  Date 11/29/22  Educator [facility]  Instruction Review Code 1- Verbalizes Understanding       Exercise: -Discuss the importance of exercise, FITT principles of exercise, normal and abnormal responses to exercise, and how to exercise safely.   Environmental Irritants: -Discuss types of environmental irritants and how to limit exposure to environmental irritants.   Meds/Inhalers and oxygen: - Discuss respiratory medications, definition of an inhaler and oxygen, and the proper way to use an inhaler and oxygen.   Energy Saving Techniques: - Discuss methods to conserve energy and decrease shortness of breath when performing  activities of daily living.  Flowsheet Row PULMONARY REHAB OTHER RESPIRATORY from 11/29/2022 in [place] [facility]  Date 10/18/22  Educator DM  Instruction Review Code 1- Verbalizes Understanding       Bronchial Hygiene / Breathing Techniques: - Discuss breathing mechanics, pursed-lip breathing technique,  proper posture, effective ways to clear airways, and  other functional breathing techniques Flowsheet Row PULMONARY REHAB OTHER RESPIRATORY from 11/29/2022 in [place] [facility]  Date 10/25/22  Educator HB       Cleaning Equipment: - Provides group verbal and written instruction about the health risks of elevated stress, cause of high stress, and healthy ways to reduce stress. Flowsheet Row PULMONARY REHAB OTHER RESPIRATORY from 11/29/2022 in [place] [facility]  Date 11/01/22  Educator [facility]  Instruction Review Code 1- Verbalizes Understanding       Nutrition I: Fats: - Discuss the types of cholesterol, what cholesterol does to the body, and how cholesterol levels can be controlled.   Nutrition II: Labels: -Discuss the different components of food labels and how to read food labels.   Respiratory Infections: - Discuss the signs and symptoms of respiratory infections, ways to prevent respiratory infections, and the importance of seeking medical treatment when having a respiratory infection. Flowsheet Row PULMONARY REHAB OTHER RESPIRATORY from 11/29/2022 in [place] [facility]  Date 08/30/22  Educator Handout       Stress I: Signs and Symptoms: - Discuss the causes of stress, how stress may lead to anxiety and depression, and ways to limit stress. Flowsheet Row PULMONARY REHAB OTHER RESPIRATORY from 11/29/2022 in [place] [facility]  Date 09/06/22  Educator handout       Stress II: Relaxation: -Discuss relaxation techniques to limit stress. Flowsheet Row PULMONARY REHAB OTHER RESPIRATORY from 11/29/2022 in [place] [facility]  Date 09/13/22  Educator handout       Oxygen for Home/Travel: - Discuss how to prepare for travel when on oxygen and proper ways to transport and store oxygen to ensure safety. Flowsheet Row PULMONARY REHAB OTHER RESPIRATORY from 11/29/2022 in [place] [facility]  Date 09/20/22  Educator handout       Knowledge  Questionnaire Score:  Knowledge Questionnaire Score - 08/22/22 1337       Knowledge Questionnaire Score   Pre Score 12/18             Core Components/Risk Factors/Patient Goals at Admission:  Personal Goals and Risk Factors at Admission - 08/22/22 1342       Core Components/Risk Factors/Patient Goals on Admission    Weight Management Weight Loss    Improve shortness of breath with ADL's Yes    Intervention Provide education, individualized exercise plan and daily activity instruction to help decrease symptoms of SOB with activities of daily living.    Expected Outcomes Short Term: Improve cardiorespiratory fitness to achieve a reduction of symptoms when performing ADLs;Long Term: Be able to perform more ADLs without symptoms or delay the onset of symptoms    Increase knowledge of respiratory medications and ability to use respiratory devices properly  Yes    Intervention Provide education and demonstration as needed of appropriate use of medications, inhalers, and oxygen therapy.    Expected Outcomes Short Term: Achieves understanding of medications use. Understands that oxygen is a medication prescribed by physician. Demonstrates appropriate use of inhaler and oxygen therapy.;Long Term: Maintain appropriate use of medications, inhalers, and oxygen therapy.    Hypertension Yes    Intervention Provide education  on lifestyle modifcations including regular physical activity/exercise, weight management, moderate sodium restriction and increased consumption of fresh fruit, vegetables, and low fat dairy, alcohol moderation, and smoking cessation.    Expected Outcomes Short Term: Continued assessment and intervention until BP is < 140/68mm HG in hypertensive participants. < 130/55mm HG in hypertensive participants with diabetes, heart failure or chronic kidney disease.;Long Term: Maintenance of blood pressure at goal levels.    Personal Goal Other Yes    Personal Goal Pt wants to breathe better  with less shortness of breath and be able to walk more and shop.    Intervention Pt will attend PR twice a week and start a home exercise program.    Expected Outcomes Pt will attend PR twice a week and complete the program meeting both her personal and program goals.             Core Components/Risk Factors/Patient Goals Review:   Goals and Risk Factor Review     Row Name 09/04/22 1206 10/01/22 1423 10/18/22 1515 11/22/22 1534 12/13/22 1529     Core Components/Risk Factors/Patient Goals Review   Personal Goals Review Improve shortness of breath with ADL's;Develop more efficient breathing techniques such as purse lipped breathing and diaphragmatic breathing and practicing self-pacing with activity. Improve shortness of breath with ADL's;Develop more efficient breathing techniques such as purse lipped breathing and diaphragmatic breathing and practicing self-pacing with activity. Improve shortness of breath with ADL's;Develop more efficient breathing techniques such as purse lipped breathing and diaphragmatic breathing and practicing self-pacing with activity. Weight Management/Obesity;Hypertension;Increase knowledge of respiratory medications and ability to use respiratory devices properly.;Develop more efficient breathing techniques such as purse lipped breathing and diaphragmatic breathing and practicing self-pacing with activity. Weight Management/Obesity;Hypertension;Increase knowledge of respiratory medications and ability to use respiratory devices properly.;Develop more efficient breathing techniques such as purse lipped breathing and diaphragmatic breathing and practicing self-pacing with activity.   Review Patient is new to the PR program attending 2 sessions.  She seems to enjoy class and interactive with class and staff.  Patient was referred to PR by Dr. 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  Her personal goals for the program are to have less SOB , breath  better and be abe to walk more while shopping.  She tolerates exercise well while on Treadmill and nu-step with O2 sats maintaining at 92% without any oxygen supplemental needed at this time.  We will continue to monitor as she works toward meeting these goals as she progresses in the program. Patient had completed 9 sessions. She is doing well in the program with consistent attendance. Her current weight is 57.3 KG down 0.9 KG from last 30 day review. She tolerates exercise well while on Treadmill and nu-step with O2 sats maintaining at 93% without any oxygen supplemental needed at this time. Her personal goals for the program are to have less SOB , breath better and be abe to walk more while shopping. We will continue to monitor as she works toward meeting these goals as she progresses in the program. Pt has been able to improve her SOB with her ADL's. Was able to shop with family and not have to take muilple breaks. Have not needed to use pursed lip breathing tech, has not pushed herself to the limit to need inhaler. Pt's weight has fluctuated but she wants to get down to 120 lbs.  Her current weight is 129 lbs and she knows that she needs to cut down on  her sweets in order to lose weight.  She also mentioned that if she lost the extra weight that might make her legs feel less tired.  She is no longer on any BP medication. [place] continues to [employer] her weight, she is staying between 128lbs to 131lbs. She has been taken off a medication by her pulmonologist that might help with her weight. She continues to watch what she eats to manage her weight. She continues to have leg pain and is going to see her vasicular doctor about the leg pain.   Expected Outcomes Patient will complete the program meeting both program and personal goals. Patient will complete the program meeting both program and personal goals. short term goal: going over pursed lip breathing long term goals: continue to improve SOB with ADL's Short  term:  Pt is going to try to adjust her diet to lose weight in order to decrease her SOB even more  Long term:  Continue to have a positive attitude and willingness to improve her overall health Short term: continue to manage weight   long term: continue to have a positive attitude and willingness to help improve her overall health.    Row Name 01/22/23 1518             Core Components/Risk Factors/Patient Goals Review   Personal Goals Review Weight Management/Obesity;Improve shortness of breath with ADL's       Review Maily continues to monitor her weight and staying between 128lbs to 130lbs. She is watching what she eats and woking on portion size for weight managment. She is no longer having the leg pain due to needing 2 units of blood. Since then she has been feeling a whole lot better.       Expected Outcomes Short term: continue to manage weight   long term: continue to have a positive attitude and willingness to help improve her overall health.                Core Components/Risk Factors/Patient Goals at Discharge (Final Review):   Goals and Risk Factor Review - 01/22/23 1518       Core Components/Risk Factors/Patient Goals Review   Personal Goals Review Weight Management/Obesity;Improve shortness of breath with ADL's    Review Jeanine continues to monitor her weight and staying between 128lbs to 130lbs. She is watching what she eats and woking on portion size for weight managment. She is no longer having the leg pain due to needing 2 units of blood. Since then she has been feeling a whole lot better.    Expected Outcomes Short term: continue to manage weight   long term: continue to have a positive attitude and willingness to help improve her overall health.             ITP Comments:  ITP Comments     Row Name 10/10/22 1613 11/07/22 1519 12/05/22 1526 01/02/23 1640 01/30/23 0825   ITP Comments 30 day review completed. ITP sent to Dr.Jehanzeb Memon, Medical Director of   Pulmonary Rehab. Continue with ITP unless changes are made by physician. 30 day review completed. ITP sent to Dr.Jehanzeb Memon, Medical Director of  Pulmonary Rehab. Continue with ITP unless changes are made by physician. 30 day review completed. ITP sent to Dr.Jehanzeb Memon, Medical Director of  Pulmonary Rehab. Continue with ITP unless changes are made by physician. 30 day review completed. ITP sent to Dr.Jehanzeb Memon, Medical Director of  Pulmonary Rehab. Continue with ITP unless changes are made by  physician. 30 day review completed. ITP sent to Dr.Jehanzeb Memon, Medical Director of  Pulmonary Rehab. Continue with ITP unless changes are made by physician.            Comments: 30 day review

## 2023-01-31 ENCOUNTER — Encounter (HOSPITAL_COMMUNITY): Payer: Self-pay | Admitting: Internal Medicine

## 2023-01-31 ENCOUNTER — Telehealth: Payer: Self-pay | Admitting: *Deleted

## 2023-01-31 ENCOUNTER — Encounter (HOSPITAL_COMMUNITY): Payer: Medicare Other

## 2023-01-31 DIAGNOSIS — J9611 Chronic respiratory failure with hypoxia: Secondary | ICD-10-CM | POA: Insufficient documentation

## 2023-01-31 DIAGNOSIS — J449 Chronic obstructive pulmonary disease, unspecified: Secondary | ICD-10-CM | POA: Insufficient documentation

## 2023-01-31 NOTE — Op Note (Incomplete)
Small Bowel Givens Capsule Study Procedure date:  01/29/23  PCP:  Dr. Assunta Found, MD  Indication for procedure:  Iron Deficiency Anemia  Patient data:  Wt: 59.1 kg Ht: 5'5"  Findings:  ***Study complete to the cecum. Quick transit throughout the small bowel. Highly vascularized area to distal ileum. 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 See full report below with images.   First Gastric image:  00:00:30 First Duodenal image: 00:07:09 First Ileo-Cecal Valve image: 02:57:12 First Cecal image: 02:57:13 Gastric Passage time: 0h 28m 39s Small Bowel Passage time:  2h 46m  Summary & Recommendations: ***   Images reviewed with Dr. Marletta Lor and we can proceed with Enteroscopy to treat angiectasias and continue to monitor hemoglobin outpatient.  Recommend continuing to avoid all NSAIDs and ASA powders.   Brooke Bonito, MSN, APRN, FNP-BC, AGACNP-BC [facility] at [facility]

## 2023-01-31 NOTE — Telephone Encounter (Signed)
Pt called and states that after her procedure on Tuesday that night she woke up with dry heaves. She states she did this around 5 times. She states he stomach is sore and she still does not feel well.

## 2023-02-05 ENCOUNTER — Encounter: Payer: Self-pay | Admitting: Physician Assistant

## 2023-02-05 ENCOUNTER — Other Ambulatory Visit: Payer: Self-pay

## 2023-02-05 ENCOUNTER — Encounter (HOSPITAL_COMMUNITY): Payer: Medicare Other

## 2023-02-05 ENCOUNTER — Ambulatory Visit: Payer: Medicare Other | Attending: Physician Assistant | Admitting: Physician Assistant

## 2023-02-05 VITALS — BP 146/80 | HR 52 | Ht 65.0 in | Wt 126.0 lb

## 2023-02-05 DIAGNOSIS — I70213 Atherosclerosis of native arteries of extremities with intermittent claudication, bilateral legs: Secondary | ICD-10-CM

## 2023-02-05 DIAGNOSIS — I10 Essential (primary) hypertension: Secondary | ICD-10-CM

## 2023-02-05 DIAGNOSIS — D649 Anemia, unspecified: Secondary | ICD-10-CM | POA: Diagnosis not present

## 2023-02-05 DIAGNOSIS — E785 Hyperlipidemia, unspecified: Secondary | ICD-10-CM | POA: Insufficient documentation

## 2023-02-05 DIAGNOSIS — I739 Peripheral vascular disease, unspecified: Secondary | ICD-10-CM

## 2023-02-05 LAB — HEMOGLOBIN AND HEMATOCRIT, BLOOD
Hematocrit: 30.6 % — ABNORMAL LOW (ref 34.0–46.6)
Hemoglobin: 8.6 g/dL — ABNORMAL LOW (ref 11.1–15.9)

## 2023-02-05 NOTE — Progress Notes (Signed)
Cardiology Office Note:  .   Date:  02/05/2023  ID:  Lindalou Hose, DOB 1950-09-21, MRN [id number] PCP: Assunta Found, MD  [facility] Providers Cardiologist:  Meriam Sprague, MD (Inactive) {  History of Present Illness: .   OLINE KNAPKE is a 72 y.o. female  with a hx of HTN, CAD, and COPD who presents for routine clinic visit.   She is followed by Dr. Work for pulmonology and was seen 02/14/2022 for COPD.  Had a lung cancer screening CT which showed severe coronary artery calcification and severe atherosclerosis in her aorta prompting referral to cardiology for further evaluation.  She was seen in clinic 11/23 where she established care for coronary calcium.  NM PET was normal.  TTE 5/21 with LVEF 77 5%, grade 1 DD, normal RV, no significant valve disease.  Was seen by vascular for claudication.  Underwent arteriogram with lower extremity runoff on 04/10/2022.  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  Underwent treatment with kissing stents at the aortic bifurcation and also separate stenting to her right external iliac artery.  She was seen Dr. Shari Prows in May 2024 and at that time was overall feeling well.  Leg symptoms had improved after stenting but still some fatigue in her legs.  Did not have dyspnea on exertion which she thought was related to her COPD.  Able to work in her yard without any issues. No orthopnea, PND, lower extremity.   Blood pressures were well-controlled.  Prior heavy smoking history of 2 packs a day x 55 years.  Today, she presents with a history of hypertension and recent hospitalization for low hemoglobin, presents for follow-up. She reports a history of high blood pressure, which was managed with losartan until a few years ago when she underwent back surgery for crushed vertebrae. Post-surgery, her blood pressure normalized, and she was advised by her previous physician to discontinue losartan. She  has been monitoring her blood pressure since then and reports it has been within normal limits. She also reports a recent hospitalization due to a hemoglobin level of 6.6, which required a blood transfusion. She is currently awaiting results from a capsule study to identify the cause of the low hemoglobin.  In addition, the patient reports a history of smoking for 55 years but has been quit for 20 months. She is currently participating in lung therapy twice a week, where her blood pressure is also monitored. She reports no current breathing problems and feels much better since the blood transfusion. She also reports a recent episode of food poisoning, which resulted in dry heaving and abdominal discomfort.  Reports no shortness of breath nor dyspnea on exertion. Reports no chest pain, pressure, or tightness. No edema, orthopnea, PND. Reports no palpitations.    Discussed the use of AI scribe software for clinical note transcription with the patient, who gave verbal consent to proceed.  ROS: Pertinent ROS in HPI  Studies Reviewed: Marland Kitchen   EKG Interpretation Date/Time:  Tuesday February 05 2023 15:08:59 EST Ventricular Rate:  82 PR Interval:  164 QRS Duration:  98 QT Interval:  400 QTC Calculation: 467 R Axis:   0  Text Interpretation: Normal sinus rhythm Cannot rule out Inferior infarct , age undetermined When compared with ECG of 09-Aug-2020 12:30, Incomplete right bundle branch block is no longer Present Confirmed by Jari Favre [phone]) on 02/05/2023 3:32:43 PM   February 13th 2024.  NM PET CT cardiac  The study is normal. The study is low to intermediate risk- TID is (1.23) with no other increased risk features.   Rest left ventricular function is normal. Rest EF: 72 %. Stress left ventricular function is normal. Stress EF: 77 %. End diastolic cavity size is normal. End systolic cavity size is normal.   Myocardial blood flow was computed to be 1.38ml/g/min at rest and 2.53ml/g/min at stress.  Global myocardial blood flow reserve was 1.81 and was mildly abnormal.  Given normal stress flows this is likely due abnormal resting flow.   Coronary calcium was present on the attenuation correction CT images. Severe coronary calcifications were present. Coronary calcifications were present in the left anterior descending artery, left circumflex artery and right coronary artery distribution(s). Heavy aortic athersoclerosis.  Mild aortic valve calcium.  Mild to moderate mitral annular calcification.   Electronically Signed by Riley Lam M.D.      Physical Exam:   VS:  BP (!) 146/80   Pulse (!) 52   Ht 5\' 5"  (1.651 m)   Wt 126 lb (57.2 kg)   SpO2 93%   BMI 20.97 kg/m    Wt Readings from Last 3 Encounters:  02/05/23 126 lb (57.2 kg)  01/16/23 127 lb (57.6 kg)  01/10/23 130 lb 4.7 oz (59.1 kg)    GEN: Well nourished, well developed in no acute distress NECK: No JVD; No carotid bruits CARDIAC: RRR, + systolic  murmurs, rubs, gallops RESPIRATORY:  Clear to auscultation without rales, wheezing or rhonchi  ABDOMEN: Soft, non-tender, non-distended EXTREMITIES:  No edema; No deformity   ASSESSMENT AND PLAN: .   Hypertension Blood pressure controlled off medication. Previously on Losartan, but discontinued due to normalization of blood pressure post back surgery. Recent reading slightly elevated, but patient reports feeling better at this level. -Monitor blood pressure during lung therapy sessions on Tuesdays and Thursdays. -Report if multiple readings above 145 systolic.  Anemia Previous episode of severe anemia with hemoglobin of 6.6 requiring blood transfusion. Recent capsule study performed to investigate potential GI bleed, but results not yet available. -Check stat hemoglobin today to ensure it has normalized post-transfusion. -Review capsule study results when available.  Mitral Valve Calcification Heart murmur noted on examination, likely due to calcification around the  mitral valve as per previous echocardiogram in 2021. -No immediate follow-up required as patient is asymptomatic. -Consider updating echocardiogram in 1-2 years or sooner if new provider deems necessary.  Smoking Cessation Successful cessation for 20 months. -Continue abstinence from smoking.      Dispo: She can follow-up in 6 months with Dr. Diona Browner  Signed, Sharlene Dory, PA-C

## 2023-02-05 NOTE — Patient Instructions (Addendum)
Medication Instructions:  Your physician recommends that you continue on your current medications as directed. Please refer to the Current Medication list given to you today.  *If you need a refill on your cardiac medications before your next appointment, please call your pharmacy*   Lab Work: TODAY:  STAT H&H If you have labs (blood work) drawn today and your tests are completely normal, you will receive your results only by: MyChart Message (if you have MyChart) OR A paper copy in the mail If you have any lab test that is abnormal or we need to change your treatment, we will call you to review the results.   Testing/Procedures: None ordered   Follow-Up: At [facility], you and your health needs are our priority.  As part of our continuing mission to provide you with exceptional heart care, we have created designated Provider Care Teams.  These Care Teams include your primary Cardiologist (physician) and Advanced Practice Providers (APPs -  Physician Assistants and Nurse Practitioners) who all work together to provide you with the care you need, when you need it.  We recommend signing up for the patient portal called "MyChart".  Sign up information is provided on this After Visit Summary.  MyChart is used to connect with patients for Virtual Visits (Telemedicine).  Patients are able to view lab/test results, encounter notes, upcoming appointments, etc.  Non-urgent messages can be sent to your provider as well.   To learn more about what you can do with MyChart, go to [email or web address].    Your next appointment:   6 month(s)  Provider:   Nona Dell, MD    Other Instructions Your physician has requested that you regularly monitor and record your blood pressure readings at home. Please use the same machine at the same time of day to check your readings and record them to bring to your follow-up visit.   Please monitor blood pressures and keep a log of your readings  2 weeks and send them in.    Make sure to check 2 hours after your medications.    AVOID these things for 30 minutes before checking your blood pressure: No Drinking caffeine. No Drinking alcohol. No Eating. No Smoking. No Exercising.   Five minutes before checking your blood pressure: Pee. Sit in a dining chair. Avoid sitting in a soft couch or armchair. Be quiet. Do not talk

## 2023-02-07 ENCOUNTER — Encounter: Payer: Self-pay | Admitting: *Deleted

## 2023-02-07 ENCOUNTER — Encounter (HOSPITAL_COMMUNITY)
Admission: RE | Admit: 2023-02-07 | Discharge: 2023-02-07 | Disposition: A | Discharge status: Home / Self Care | Payer: Medicare Other | Source: Ambulatory Visit | Attending: Family Medicine | Admitting: Family Medicine

## 2023-02-07 DIAGNOSIS — J9611 Chronic respiratory failure with hypoxia: Secondary | ICD-10-CM

## 2023-02-07 DIAGNOSIS — J449 Chronic obstructive pulmonary disease, unspecified: Secondary | ICD-10-CM

## 2023-02-07 NOTE — Progress Notes (Signed)
Daily Session Note  Patient Details  Name: Desiree Barber MRN: [id number] Date of Birth: 10/30/1950 Referring Provider:   Flowsheet Row PULMONARY REHAB OTHER RESP ORIENTATION from 08/22/2022 in [facility] CARDIAC REHABILITATION  Referring Provider Dr. Sherene Sires       Encounter Date: 02/07/2023  Check In:  Session Check In - 02/07/23 1430       Check-In   Supervising physician immediately available to respond to emergencies See telemetry face sheet for immediately available ER MD    Location [facility]    Staff Present Ross Ludwig, BS, Exercise Physiologist;Danny Gala Romney, RN, Neal Dy, RN, BSN    Virtual Visit No    Medication changes reported     No    Fall or balance concerns reported    No    Warm-up and Cool-down Performed on first and last piece of equipment    Resistance Training Performed Yes    VAD Patient? No    PAD/SET Patient? No      Pain Assessment   Currently in Pain? No/denies    Pain Score 0-No pain    Multiple Pain Sites No             Capillary Blood Glucose: No results found for this or any previous visit (from the past 24 hour(s)).    Social History   Tobacco Use  Smoking Status Former   Current packs/day: 0.00   Average packs/day: 0.5 packs/day for 55.0 years (27.5 ttl pk-yrs)   Types: Cigarettes   Start date: 05/30/1966   Quit date: 05/29/2021   Years since quitting: 1.6   Passive exposure: Past  Smokeless Tobacco Former  Tobacco Comments   Quit date 05/29/2021    Goals Met:  Proper associated with RPD/PD & O2 Sat Independence with exercise equipment Using PLB without cueing & demonstrates good technique Exercise tolerated well No report of concerns or symptoms today Strength training completed today  Goals Unmet:  Not Applicable  Comments: Pt able to follow exercise prescription today without complaint.  Will continue to monitor for progression.

## 2023-02-12 ENCOUNTER — Telehealth: Payer: Self-pay | Admitting: Gastroenterology

## 2023-02-12 ENCOUNTER — Encounter (HOSPITAL_COMMUNITY)
Admit: 2023-02-12 | Discharge: 2023-02-12 | Disposition: A | Discharge status: Home / Self Care | Payer: Medicare Other | Attending: Internal Medicine | Admitting: Internal Medicine

## 2023-02-12 DIAGNOSIS — D509 Iron deficiency anemia, unspecified: Secondary | ICD-10-CM | POA: Diagnosis not present

## 2023-02-12 DIAGNOSIS — J9611 Chronic respiratory failure with hypoxia: Secondary | ICD-10-CM

## 2023-02-12 DIAGNOSIS — K31819 Angiodysplasia of stomach and duodenum without bleeding: Secondary | ICD-10-CM | POA: Diagnosis not present

## 2023-02-12 DIAGNOSIS — J449 Chronic obstructive pulmonary disease, unspecified: Secondary | ICD-10-CM | POA: Diagnosis not present

## 2023-02-12 NOTE — Progress Notes (Signed)
Daily Session Note  Patient Details  Name: Desiree Barber MRN: [id number] Date of Birth: 1950-11-28 Referring Provider:   Flowsheet Row PULMONARY REHAB OTHER RESP ORIENTATION from 08/22/2022 in [facility] CARDIAC REHABILITATION  Referring Provider Dr. Sherene Sires       Encounter Date: 02/12/2023  Check In:  Session Check In - 02/12/23 1430       Check-In   Supervising physician immediately available to respond to emergencies See telemetry face sheet for immediately available MD    Location [facility]    Staff Present Ross Ludwig, BS, Exercise Physiologist;Kamau Weatherall, RN;Other    Virtual Visit No    Medication changes reported     No    Fall or balance concerns reported    No    Tobacco Cessation No Change    Warm-up and Cool-down Performed on first and last piece of equipment    Resistance Training Performed Yes    VAD Patient? No    PAD/SET Patient? No      Pain Assessment   Currently in Pain? No/denies    Pain Score 0-No pain    Multiple Pain Sites No             Capillary Blood Glucose: No results found for this or any previous visit (from the past 24 hour(s)).    Social History   Tobacco Use  Smoking Status Former   Current packs/day: 0.00   Average packs/day: 0.5 packs/day for 55.0 years (27.5 ttl pk-yrs)   Types: Cigarettes   Start date: 05/30/1966   Quit date: 05/29/2021   Years since quitting: 1.7   Passive exposure: Past  Smokeless Tobacco Former  Tobacco Comments   Quit date 05/29/2021    Goals Met:  Proper associated with RPD/PD & O2 Sat Independence with exercise equipment Using PLB without cueing & demonstrates good technique Exercise tolerated well No report of concerns or symptoms today Strength training completed today  Goals Unmet:  Not Applicable  Comments: Pt able to follow exercise prescription today without complaint.  Will continue to monitor for progression.

## 2023-02-12 NOTE — Telephone Encounter (Signed)
Small bowel capsule read.  Findings/summary and recommendations below.  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  Could be having slow oozing related to ongoing Plavix.  Only a few lesions noted on exam however given scattered decreased visibility by gastric contents, unable to rule out any other further lesions.  Despite no active bleeding, angioectasias could still be contributing to anemia therefore will offer enteroscopy to patient as soon as possible.    Recent hgb recheck 11/12 was 8.2 per patient.   Images reviewed with Dr. Marletta Lor and we can proceed with Enteroscopy to treat angiectasias and continue to monitor hemoglobin outpatient. She will be ASA 3. Will need to hold plavix for 5 days, request clearance.   Will plan for blood transfusions and/or iron infusions as needed.   Recommend continuing to avoid all NSAIDs and ASA powders.   Brooke Bonito, MSN, APRN, FNP-BC, AGACNP-BC [facility] at [facility]

## 2023-02-13 ENCOUNTER — Encounter: Payer: Self-pay | Admitting: Internal Medicine

## 2023-02-13 ENCOUNTER — Ambulatory Visit (INDEPENDENT_AMBULATORY_CARE_PROVIDER_SITE_OTHER): Payer: Medicare Other | Admitting: Internal Medicine

## 2023-02-13 ENCOUNTER — Encounter: Payer: Self-pay | Admitting: *Deleted

## 2023-02-13 VITALS — BP 138/78 | HR 76 | Temp 98.4°F | Ht 65.0 in | Wt 126.5 lb

## 2023-02-13 DIAGNOSIS — Z79899 Other long term (current) drug therapy: Secondary | ICD-10-CM

## 2023-02-13 DIAGNOSIS — K2970 Gastritis, unspecified, without bleeding: Secondary | ICD-10-CM | POA: Diagnosis not present

## 2023-02-13 DIAGNOSIS — D509 Iron deficiency anemia, unspecified: Secondary | ICD-10-CM | POA: Diagnosis not present

## 2023-02-13 DIAGNOSIS — K31819 Angiodysplasia of stomach and duodenum without bleeding: Secondary | ICD-10-CM

## 2023-02-13 DIAGNOSIS — B9681 Helicobacter pylori [H. pylori] as the cause of diseases classified elsewhere: Secondary | ICD-10-CM

## 2023-02-13 DIAGNOSIS — R195 Other fecal abnormalities: Secondary | ICD-10-CM

## 2023-02-13 LAB — CBC
HCT: 32.7 % — ABNORMAL LOW (ref 35.0–45.0)
Hemoglobin: 9.2 g/dL — ABNORMAL LOW (ref 11.7–15.5)
MCH: 22.7 pg — ABNORMAL LOW (ref 27.0–33.0)
MCHC: 28.1 g/dL — ABNORMAL LOW (ref 32.0–36.0)
MCV: 80.7 fL (ref 80.0–100.0)
MPV: 9.9 fL (ref 7.5–12.5)
Platelets: 696 10*3/uL — ABNORMAL HIGH (ref 140–400)
RBC: 4.05 Million/uL (ref 3.80–5.10)
RDW: 18.4 % — ABNORMAL HIGH (ref 11.0–15.0)
WBC: 10.2 10*3/uL (ref 3.8–10.8)

## 2023-02-13 MED ORDER — FERROUS SULFATE 324 (65 FE) MG PO TBEC: 1.0000 | DELAYED_RELEASE_TABLET | Freq: Every day | ORAL | 11 refills | Status: DC

## 2023-02-13 NOTE — Telephone Encounter (Signed)
See previous notes.

## 2023-02-13 NOTE — Telephone Encounter (Signed)
Spoke to pt, informed pt of results and recommendations. Pt voiced understanding. Pt states she is not feeling well, there is something wrong. She stated she would like to speak with the doctor. An OV was made for her today with Dr. Marletta Lor.

## 2023-02-13 NOTE — Progress Notes (Signed)
Referring Provider: Assunta Found, MD Primary Care Physician:  Assunta Found, MD Primary GI:  Dr. Marletta Lor  Chief Complaint  Patient presents with   low hemoglobin    Would like to discuss low hemoglobin. Gets tired with activity, having dark stools, very little dizziness, eats good. Started taking iron 3 days ago.     HPI:   Desiree Barber is a 72 y.o. female who presents to clinic today for follow up visit. History of H pylori gastritis, angiectasias, iron deficiency anemia.   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 Pylori -Esophageal biopsies consistent with Barrett's -Advised repeat EGD in 5 years for surveillance   Colonoscopy 08/27/22: -Nonbleeding internal hemorrhoids -Pancolonic diverticulosis -Advised repeat in 10 years for screening   Given positive H. pylori on pathology she was treated with bismuth quadruple (metronidazole, tetracycline) therapy with PPI briefly increased to twice daily.  Sunsequent H pylori breath test positive. Retreated with salvage therapy with levofloxacin/amoxicillin.  Underwent capsule endoscopy 01/29/2023 which showed multiple angiodysplastic lesions /angioectasia's and erosions present throughout proximal and mid small bowel without active bleeding.  Blood work 01/10/2023 hemoglobin 6.6, Iron 11, ferritin 8. Was transfused PRBCs, most recent check 02/05/2023 hemoglobin 8.6. Not currently taking iron.  Past Medical History:  Diagnosis Date   Anemia    Anxiety    Occasional   Arthritis    COPD (chronic obstructive pulmonary disease) (HCC)    HTN (hypertension)    Iron deficiency anemia due to chronic blood loss 03/28/2022   Peripheral arterial disease [facility])     Past Surgical History:  Procedure  Laterality Date   ABDOMINAL AORTOGRAM W/LOWER EXTREMITY N/A 04/10/2022   Procedure: ABDOMINAL AORTOGRAM W/LOWER EXTREMITY;  Surgeon: Nada Libman, MD;  Location: [facility];  Service: Cardiovascular;  Laterality: N/A;   BIOPSY  08/27/2022   Procedure: BIOPSY;  Surgeon: Lanelle Bal, DO;  Location: [facility];  Service: Endoscopy;;   COLONOSCOPY  02/23/04   RMR: normal rectum and colon.minimal internal hemorrhoids   COLONOSCOPY WITH PROPOFOL N/A 06/13/2015   Procedure: COLONOSCOPY WITH PROPOFOL;  Surgeon: Corbin Ade, MD;  Location: [facility];  Service: Endoscopy;  Laterality: N/A;  1045   COLONOSCOPY WITH PROPOFOL N/A 08/27/2022   Procedure: COLONOSCOPY WITH PROPOFOL;  Surgeon: Lanelle Bal, DO;  Location: [facility];  Service: Endoscopy;  Laterality: N/A;  8:45 am, asa 3   ESOPHAGOGASTRODUODENOSCOPY (EGD) WITH PROPOFOL N/A 08/27/2022   Procedure: ESOPHAGOGASTRODUODENOSCOPY (EGD) WITH PROPOFOL;  Surgeon: Lanelle Bal, DO;  Location: [facility];  Service: Endoscopy;  Laterality: N/A;   GIVENS CAPSULE STUDY N/A 01/29/2023   Procedure: GIVENS CAPSULE STUDY;  Surgeon: Lanelle Bal, DO;  Location: [facility];  Service: Endoscopy;  Laterality: N/A;  730am   HOT HEMOSTASIS  08/27/2022   Procedure: HOT HEMOSTASIS (ARGON PLASMA COAGULATION/BICAP);  Surgeon: Lanelle Bal, DO;  Location: [facility];  Service: Endoscopy;;   POLYPECTOMY  06/13/2015   Procedure: POLYPECTOMY;  Surgeon: Corbin Ade, MD;  Location: [facility];  Service: Endoscopy;;  sigmoid colon polyp   TOOTH EXTRACTION     TRIGGER FINGER RELEASE Bilateral    TUBAL LIGATION      Current Outpatient Medications  Medication Sig Dispense Refill   albuterol (PROVENTIL) (2.5 MG/3ML) 0.083%  nebulizer solution Up to every 4 hours as needed 75 mL 12   albuterol (VENTOLIN HFA) 108 (90 Base) MCG/ACT inhaler Inhale into the lungs.     ALPRAZolam (XANAX) 0.25 MG tablet Take 0.25 mg by mouth daily  as needed for anxiety or sleep.     Ascorbic Acid (VITAMIN C) 100 MG tablet Take 100 mg by mouth daily.     clopidogrel (PLAVIX) 75 MG tablet Take 1 tablet (75 mg total) by mouth daily. 30 tablet 11   OVER THE COUNTER MEDICATION Otc iron one daily     pantoprazole (PROTONIX) 40 MG tablet Take 1 tablet (40 mg total) by mouth 2 (two) times daily. 60 tablet 11   umeclidinium-vilanterol (ANORO ELLIPTA) 62.5-25 MCG/ACT AEPB Inhale 1 puff into the lungs daily. 1 each 11   vitamin B-12 (CYANOCOBALAMIN) 100 MCG tablet Take 100 mcg by mouth daily.     VITAMIN D, CHOLECALCIFEROL, PO Take 1 tablet by mouth daily. One daily     No current facility-administered medications for this visit.    Allergies as of 02/13/2023 - Review Complete 02/13/2023  Allergen Reaction Noted   Demerol [meperidine] Nausea And Vomiting 05/03/2015   Oxycontin [oxycodone hcl] Nausea And Vomiting 05/03/2015   Fish allergy Nausea Only 06/13/2015   Lavender oil Itching 02/28/2022   Other Itching 02/28/2022    Family History  Problem Relation Age of Onset   Colon cancer Neg Hx    Breast cancer Neg Hx     Social History   Socioeconomic History   Marital status: Married    Spouse name: Not on file   Number of children: Not on file   Years of education: Not on file   Highest education level: Not on file  Occupational History   Not on file  Tobacco Use   Smoking status: Former    Current packs/day: 0.00    Average packs/day: 0.5 packs/day for 55.0 years (27.5 ttl pk-yrs)    Types: Cigarettes    Start date: 05/30/1966    Quit date: 05/29/2021    Years since quitting: 1.7    Passive exposure: Past   Smokeless tobacco: Former   Tobacco comments:    Quit date 05/29/2021  Vaping Use   Vaping status: Never Used  Substance and Sexual Activity   Alcohol use: Yes    Alcohol/week: 0.0 standard drinks of alcohol    Comment: 3 beers a day   Drug use: No   Sexual activity: Yes    Birth control/protection: Surgical  Other  Topics Concern   Not on file  Social History Narrative   Not on file   Social Determinants of Health   Financial Resource Strain: Not on file  Food Insecurity: Unknown (12/24/2021)   Hunger Vital Sign    Worried About Running Out of Food in the Last Year: Never true    Ran Out of Food in the Last Year: Not on file  Transportation Needs: No Transportation Needs (12/24/2021)   PRAPARE - Administrator, Civil Service (Medical): No    Lack of Transportation (Non-Medical): No  Physical Activity: Not on file  Stress: Not on file  Social Connections: Unknown (08/07/2021)   [facility] from [facility]   Social Network    Social Network: Not on file    Subjective: Review of Systems  Constitutional:  Positive for malaise/fatigue. Negative for chills and fever.  HENT:  Negative for congestion and hearing loss.   Eyes:  Negative  for blurred vision and double vision.  Respiratory:  Negative for cough and shortness of breath.   Cardiovascular:  Negative for chest pain and palpitations.  Gastrointestinal:  Negative for abdominal pain, blood in stool, constipation, diarrhea, heartburn, melena and vomiting.  Genitourinary:  Negative for dysuria and urgency.  Musculoskeletal:  Negative for joint pain and myalgias.  Skin:  Negative for itching and rash.  Neurological:  Negative for dizziness and headaches.  Psychiatric/Behavioral:  Negative for depression. The patient is not nervous/anxious.      Objective: BP 138/78 (BP Location: Left Arm, Patient Position: Sitting, Cuff Size: Normal)   Pulse 76   Temp 98.4 F (36.9 C) (Oral)   Ht 5\' 5"  (1.651 m)   Wt 126 lb 8 oz (57.4 kg)   BMI 21.05 kg/m  Physical Exam Constitutional:      Appearance: Normal appearance.  HENT:     Head: Normocephalic and atraumatic.  Eyes:     Extraocular Movements: Extraocular movements intact.     Conjunctiva/sclera: Conjunctivae normal.  Cardiovascular:     Rate and Rhythm: Normal  rate and regular rhythm.  Pulmonary:     Effort: Pulmonary effort is normal.     Breath sounds: Normal breath sounds.  Abdominal:     General: Bowel sounds are normal.     Palpations: Abdomen is soft.  Musculoskeletal:        General: No swelling. Normal range of motion.     Cervical back: Normal range of motion and neck supple.  Skin:    General: Skin is warm and dry.     Coloration: Skin is not jaundiced.  Neurological:     General: No focal deficit present.     Mental Status: She is alert and oriented to person, place, and time.  Psychiatric:        Mood and Affect: Mood normal.        Behavior: Behavior normal.      Assessment: *Iron deficiency anemia *Angioectasias of the small bowel *H. pylori gastritis *Heme positive stool  Plan: Discussed capsule endoscopy results in depth with patient today.  Will schedule for enteroscopy to further evaluate.    Patient will need to hold her Plavix x 5 days prior to procedure.  Counseled on slight increased risk of cardiovascular event during this time and she understands and is agreeable.  Patient has completed salvage therapy for H. pylori gastritis with levofloxacin/amoxicillin..  Initially treated with quad therapy metronidazole, tetracycline.  Will rebiopsy her stomach during above procedure to reevaluate.  Will start on oral iron therapy.  Check CBC today.  Follow-up after enteroscopy.   02/13/2023 1:56 PM   Disclaimer: This note was dictated with voice recognition software. Similar sounding words can inadvertently be transcribed and may not be corrected upon review.

## 2023-02-13 NOTE — Patient Instructions (Signed)
We will schedule you for a small bowel enteroscopy to further evaluate your iron deficiency anemia.  You will need to hold your Plavix x 5 days prior to procedure.  I am going to send in a prescription for oral iron to your pharmacy.  I will check your blood counts today at [facility] lab.  We will call with results.  It was very nice seeing both you today.  Dr. Marletta Lor

## 2023-02-13 NOTE — Telephone Encounter (Signed)
Pt seen in office and scheduled

## 2023-02-14 ENCOUNTER — Encounter: Payer: Self-pay | Admitting: *Deleted

## 2023-02-14 ENCOUNTER — Encounter (HOSPITAL_COMMUNITY)
Admit: 2023-02-14 | Discharge: 2023-02-14 | Disposition: A | Discharge status: Home / Self Care | Payer: Medicare Other | Attending: Internal Medicine | Admitting: Internal Medicine

## 2023-02-14 VITALS — Ht 64.0 in | Wt 127.4 lb

## 2023-02-14 DIAGNOSIS — J9611 Chronic respiratory failure with hypoxia: Secondary | ICD-10-CM | POA: Diagnosis not present

## 2023-02-14 DIAGNOSIS — J449 Chronic obstructive pulmonary disease, unspecified: Secondary | ICD-10-CM

## 2023-02-14 NOTE — Progress Notes (Signed)
Charlene graduated today from  rehab with 36 sessions completed.  Details of the patient's exercise prescription and what She needs to do in order to continue the prescription and progress were discussed with patient.  Patient was given a copy of prescription and goals.  Patient verbalized understanding. Jd plans to continue to exercise by walking at home and joining maintenance.

## 2023-02-14 NOTE — Progress Notes (Signed)
Daily Session Note  Patient Details  Name: Desiree Barber MRN: [id number] Date of Birth: 22-Mar-1951 Referring Provider:   Flowsheet Row PULMONARY REHAB OTHER RESP ORIENTATION from 08/22/2022 in [facility] C[facility]  Referring Provider Dr. Sherene Sires       Encounter Date: 02/14/2023  Check In:  Session Check In - 02/14/23 1420       Check-In   Supervising physician immediately available to respond to emergencies See telemetry face sheet for immediately available ER MD    Location [facility]    Staff Present Rodena Medin, RN, BSN;Heather Fredric Mare, BS, Exercise Physiologist    Virtual Visit No    Medication changes reported     No    Fall or balance concerns reported    No    Warm-up and Cool-down Performed on first and last piece of equipment    Resistance Training Performed Yes    VAD Patient? No    PAD/SET Patient? No      Pain Assessment   Currently in Pain? No/denies    Pain Score 0-No pain    Multiple Pain Sites No             Capillary Blood Glucose: No results found for this or any previous visit (from the past 24 hour(s)).    Social History   Tobacco Use  Smoking Status Former   Current packs/day: 0.00   Average packs/day: 0.5 packs/day for 55.0 years (27.5 ttl pk-yrs)   Types: Cigarettes   Start date: 05/30/1966   Quit date: 05/29/2021   Years since quitting: 1.7   Passive exposure: Past  Smokeless Tobacco Former  Tobacco Comments   Quit date 05/29/2021    Goals Met:  Proper associated with RPD/PD & O2 Sat Independence with exercise equipment Using PLB without cueing & demonstrates good technique Exercise tolerated well No report of concerns or symptoms today Strength training completed today  Goals Unmet:  Not Applicable  Comments: Pt able to follow exercise prescription today without complaint.  Will continue to monitor for progression.

## 2023-02-15 NOTE — Progress Notes (Signed)
Pulmonary Individual Treatment Plan  Patient Details  Name: Desiree Barber MRN: [id number] Date of Birth: Dec 10, 1950 Referring Provider:   Flowsheet Row PULMONARY REHAB OTHER RESP ORIENTATION from 08/22/2022 in [facility] CARDIAC REHABILITATION  Referring Provider Dr. Sherene Sires       Initial Encounter Date:  Flowsheet Row PULMONARY REHAB OTHER RESP ORIENTATION from 08/22/2022 in [facility] CARDIAC REHABILITATION  Date 08/22/22       Visit Diagnosis: Chronic respiratory failure with hypoxia (HCC)  COPD mixed type (HCC)  Patient's Home Medications on Admission:   Current Outpatient Medications:    albuterol (PROVENTIL) (2.5 MG/3ML) 0.083% nebulizer solution, Up to every 4 hours as needed, Disp: 75 mL, Rfl: 12   albuterol (VENTOLIN HFA) 108 (90 Base) MCG/ACT inhaler, Inhale into the lungs., Disp: , Rfl:    ALPRAZolam (XANAX) 0.25 MG tablet, Take 0.25 mg by mouth daily as needed for anxiety or sleep., Disp: , Rfl:    Ascorbic Acid (VITAMIN C) 100 MG tablet, Take 100 mg by mouth daily., Disp: , Rfl:    clopidogrel (PLAVIX) 75 MG tablet, Take 1 tablet (75 mg total) by mouth daily., Disp: 30 tablet, Rfl: 11   ferrous sulfate 324 (65 Fe) MG TBEC, Take 1 tablet (325 mg total) by mouth daily with breakfast., Disp: 30 tablet, Rfl: 11   OVER THE COUNTER MEDICATION, Otc iron one daily, Disp: , Rfl:    pantoprazole (PROTONIX) 40 MG tablet, Take 1 tablet (40 mg total) by mouth 2 (two) times daily., Disp: 60 tablet, Rfl: 11   umeclidinium-vilanterol (ANORO ELLIPTA) 62.5-25 MCG/ACT AEPB, Inhale 1 puff into the lungs daily., Disp: 1 each, Rfl: 11   vitamin B-12 (CYANOCOBALAMIN) 100 MCG tablet, Take 100 mcg by mouth daily., Disp: , Rfl:    VITAMIN D, CHOLECALCIFEROL, PO, Take 1 tablet by mouth daily. One daily, Disp: , Rfl:   Past Medical History: Past Medical History:  Diagnosis Date   Anemia    Anxiety    Occasional   Arthritis    COPD (chronic obstructive pulmonary disease) (HCC)    HTN  (hypertension)    Iron deficiency anemia due to chronic blood loss 03/28/2022   Peripheral arterial disease (HCC)     Tobacco Use: Social History   Tobacco Use  Smoking Status Former   Current packs/day: 0.00   Average packs/day: 0.5 packs/day for 55.0 years (27.5 ttl pk-yrs)   Types: Cigarettes   Start date: 05/30/1966   Quit date: 05/29/2021   Years since quitting: 1.7   Passive exposure: Past  Smokeless Tobacco Former  Tobacco Comments   Quit date 05/29/2021    Labs: Review Flowsheet       Latest Ref Rng & Units 03/01/2022 04/10/2022  Labs for ITP Cardiac and Pulmonary Rehab  Cholestrol 0 - 200 mg/dL 440  -  LDL (calc) 0 - 99 mg/dL 65  -  HDL-C >34 mg/dL 77  -  Trlycerides <742 mg/dL 38  -  TCO2 22 - 32 mmol/L - 28     Details            Capillary Blood Glucose: No results found for: "GLUCAP"   Pulmonary Assessment Scores:  Pulmonary Assessment Scores     Row Name 08/22/22 1314         ADL UCSD   SOB Score total 34     Rest 0     Walk 3     Stairs 4     Bath 0  Dress 0     Shop 2       CAT Score   CAT Score 15       mMRC Score   mMRC Score 1             UCSD: Self-administered rating of dyspnea associated with activities of daily living (ADLs) 6-point scale (0 = "not at all" to 5 = "maximal or unable to do because of breathlessness")  Scoring Scores range from 0 to 120.  Minimally important difference is 5 units  CAT: CAT can identify the health impairment of COPD patients and is better correlated with disease progression.  CAT has a scoring range of zero to 40. The CAT score is classified into four groups of low (less than 10), medium (10 - 20), high (21-30) and very high (31-40) based on the impact level of disease on health status. A CAT score over 10 suggests significant symptoms.  A worsening CAT score could be explained by an exacerbation, poor medication adherence, poor inhaler technique, or progression of COPD or comorbid  conditions.  CAT MCID is 2 points  mMRC: mMRC (Modified Medical Research Council) Dyspnea Scale is used to assess the degree of baseline functional disability in patients of respiratory disease due to dyspnea. No minimal important difference is established. A decrease in score of 1 point or greater is considered a positive change.   Pulmonary Function Assessment:   Exercise Target Goals: Exercise Program Goal: Individual exercise prescription set using results from initial 6 min walk test and THRR while considering  patient's activity barriers and safety.   Exercise Prescription Goal: Initial exercise prescription builds to 30-45 minutes a day of aerobic activity, 2-3 days per week.  Home exercise guidelines will be given to patient during program as part of exercise prescription that the participant will acknowledge.  Activity Barriers & Risk Stratification:  Activity Barriers & Cardiac Risk Stratification - 08/22/22 1322       Activity Barriers & Cardiac Risk Stratification   Activity Barriers Arthritis;Back Problems;Neck/Spine Problems;Joint Problems;Deconditioning;Muscular Weakness;Shortness of Breath    Cardiac Risk Stratification High             6 Minute Walk:  6 Minute Walk     Row Name 08/22/22 1432 02/15/23 1030       6 Minute Walk   Phase Initial Discharge    Distance 800 feet 950 feet    Walk Time 6 minutes 6 minutes    # of Rest Breaks 1 1    MPH 1.5 1.8    METS 2.38 2.45    RPE 13 13    Perceived Dyspnea  13 1    VO2 Peak 8.33 8.57    Symptoms Yes (comment) Yes (comment)    Comments pt needed one sittting break due to SOB pt needed one sittting break due to SOB    Resting HR 81 bpm 77 bpm    Resting BP 134/60 120/60    Resting Oxygen Saturation  92 % 94 %    Exercise Oxygen Saturation  during 6 min walk 80 % 89 %    Max Ex. HR 109 bpm 101 bpm    Max Ex. BP 140/68 126/70    2 Minute Post BP 136/60 124/70      Interval HR   1 Minute HR 93 94    2  Minute HR 104 90    3 Minute HR 103 94    4 Minute HR 109 92  5 Minute HR 103 93    6 Minute HR 105 101    2 Minute Post HR 91 82    Interval Heart Rate? Yes Yes      Interval Oxygen   Interval Oxygen? Yes Yes    Baseline Oxygen Saturation % 92 % 94 %    1 Minute Oxygen Saturation % 90 % 93 %    1 Minute Liters of Oxygen 0 L 0 L    2 Minute Oxygen Saturation % 85 % 93 %    2 Minute Liters of Oxygen 0 L 0 L    3 Minute Oxygen Saturation % 88 % 93 %    3 Minute Liters of Oxygen 0 L 0 L    4 Minute Oxygen Saturation % 83 % 90 %    4 Minute Liters of Oxygen -- 0 L    5 Minute Oxygen Saturation % 81 % 92 %    5 Minute Liters of Oxygen -- 0 L    6 Minute Oxygen Saturation % 80 % 89 %    6 Minute Liters of Oxygen 0 L 0 L    2 Minute Post Oxygen Saturation % 92 % 95 %    2 Minute Post Liters of Oxygen 0 L 0 L             Oxygen Initial Assessment:  Oxygen Initial Assessment - 08/22/22 1437       Initial 6 min Walk   Oxygen Used None      Program Oxygen Prescription   Program Oxygen Prescription None   pt did not have accuret reading due to nail polish, will be using forhead sensor if nail polish isnt removed            Oxygen Re-Evaluation:  Oxygen Re-Evaluation     Row Name 10/09/22 1544 10/18/22 1522 10/26/22 1239 11/22/22 1510 12/13/22 1539     Program Oxygen Prescription   Program Oxygen Prescription None None -- None None     Home Oxygen   Home Oxygen Device Portable Concentrator Portable Concentrator -- Portable Concentrator Portable Concentrator   Sleep Oxygen Prescription None None -- None None   Home Exercise Oxygen Prescription Pulsed Pulsed -- None None   Liters per minute 2 2 -- -- --   Home Resting Oxygen Prescription None None -- Pulsed  Pt wears her 02 15-20 min a day when she is on her computer Pulsed   Liters per minute -- -- -- 2 --   Compliance with Home Oxygen Use -- Yes -- Yes Yes     Goals/Expected Outcomes   Short Term Goals To learn  and exhibit compliance with exercise, home and travel O2 prescription;To learn and understand importance of monitoring SPO2 with pulse oximeter and demonstrate accurate use of the pulse oximeter.;To learn and understand importance of maintaining oxygen saturations>88%;To learn and demonstrate proper pursed lip breathing techniques or other breathing techniques. ;To learn and demonstrate proper use of respiratory medications To learn and exhibit compliance with exercise, home and travel O2 prescription;To learn and understand importance of monitoring SPO2 with pulse oximeter and demonstrate accurate use of the pulse oximeter.;To learn and understand importance of maintaining oxygen saturations>88%;To learn and demonstrate proper pursed lip breathing techniques or other breathing techniques. ;To learn and demonstrate proper use of respiratory medications To learn and demonstrate proper pursed lip breathing techniques or other breathing techniques.  To learn and exhibit compliance with exercise, home and travel O2 prescription;To learn  and understand importance of monitoring SPO2 with pulse oximeter and demonstrate accurate use of the pulse oximeter.;To learn and understand importance of maintaining oxygen saturations>88%;To learn and demonstrate proper pursed lip breathing techniques or other breathing techniques. ;To learn and demonstrate proper use of respiratory medications To learn and exhibit compliance with exercise, home and travel O2 prescription;To learn and understand importance of monitoring SPO2 with pulse oximeter and demonstrate accurate use of the pulse oximeter.;To learn and understand importance of maintaining oxygen saturations>88%;To learn and demonstrate proper pursed lip breathing techniques or other breathing techniques. ;To learn and demonstrate proper use of respiratory medications   Long  Term Goals Compliance with respiratory medication;Exhibits proper breathing techniques, such as pursed  lip breathing or other method taught during program session;Maintenance of O2 saturations>88%;Verbalizes importance of monitoring SPO2 with pulse oximeter and return demonstration;Exhibits compliance with exercise, home  and travel O2 prescription Compliance with respiratory medication;Exhibits proper breathing techniques, such as pursed lip breathing or other method taught during program session;Maintenance of O2 saturations>88%;Verbalizes importance of monitoring SPO2 with pulse oximeter and return demonstration;Exhibits compliance with exercise, home  and travel O2 prescription Exhibits proper breathing techniques, such as pursed lip breathing or other method taught during program session Exhibits compliance with exercise, home  and travel O2 prescription;Verbalizes importance of monitoring SPO2 with pulse oximeter and return demonstration;Maintenance of O2 saturations>88%;Exhibits proper breathing techniques, such as pursed lip breathing or other method taught during program session;Compliance with respiratory medication;Demonstrates proper use of MDI's Exhibits compliance with exercise, home  and travel O2 prescription;Verbalizes importance of monitoring SPO2 with pulse oximeter and return demonstration;Maintenance of O2 saturations>88%;Exhibits proper breathing techniques, such as pursed lip breathing or other method taught during program session;Compliance with respiratory medication;Demonstrates proper use of MDI's   Comments -- Micah Flesher to Dr to make sure conentrator was good and aproval to take on airplane for trip to [place]. Asked her about her pursed lip breathing when needed ans she stated that she didnt use it but also sounded that she did not fully understand. Her O2 states are maintaning above 88% and knows how to take her SpO2 with portable pulse ox. Diaphragmatic and PLB breathing explained and performed with patient. Patient has a better understanding of how to do these exercises to help with  breathing performance and relaxation. Patient performed breathing techniques adequately and to practice further at home. Pt does not wear 02 during exercise.  She states that her MD told her to wear 2L 02 at home while she is resting for 15-20 min a day.  She has a pulse ox at home and understands that her oxygen level needs to be above 88%.  She has an albuterol inhaler as needed but has not had to use it. Desiree Barber does not wear oxygen during exercise at rehab and her oxygen stats have been WNL. She does have oxygen at home but does not need to wear it at home. She stated that she will wear it when she feels very SOB. She has albuterol inhaler and does use it as needed.   Goals/Expected Outcomes -- short term goal: reminders about PLB   Long term goal: continue to monitor stats at home and on trip. Short: practice PLB and diaphragmatic breathing at home. Long: Use PLB and diaphragmatic breathing independently Short term:  Continue to wear her oxygen as prescribed and practice PLB   Long term:  Use albuterol as needed and continue PLB at home Short term:  Continue to wear her oxygen as prescribed  and practice PLB   Long term:  Use albuterol as needed and continue PLB at home    Row Name 01/22/23 1520             Program Oxygen Prescription   Program Oxygen Prescription None         Home Oxygen   Home Oxygen Device Portable Concentrator       Sleep Oxygen Prescription None       Home Exercise Oxygen Prescription None       Compliance with Home Oxygen Use Yes         Goals/Expected Outcomes   Long  Term Goals Exhibits compliance with exercise, home  and travel O2 prescription;Verbalizes importance of monitoring SPO2 with pulse oximeter and return demonstration;Maintenance of O2 saturations>88%;Exhibits proper breathing techniques, such as pursed lip breathing or other method taught during program session;Compliance with respiratory medication;Demonstrates proper use of MDI's       Comments Desiree Barber  does not wear oxygen during exercise at rehab and her oxygen stats have been WNL. She does have oxygen at home but does not need to wear it at home. She stated that she will wear it when she feels very SOB. She has albuterol inhaler and does use it as needed.       Goals/Expected Outcomes Short term:  Continue to wear her oxygen as prescribed and practice PLB   Long term:  Use albuterol as needed and continue PLB at home                Oxygen Discharge (Final Oxygen Re-Evaluation):  Oxygen Re-Evaluation - 01/22/23 1520       Program Oxygen Prescription   Program Oxygen Prescription None      Home Oxygen   Home Oxygen Device Portable Concentrator    Sleep Oxygen Prescription None    Home Exercise Oxygen Prescription None    Compliance with Home Oxygen Use Yes      Goals/Expected Outcomes   Long  Term Goals Exhibits compliance with exercise, home  and travel O2 prescription;Verbalizes importance of monitoring SPO2 with pulse oximeter and return demonstration;Maintenance of O2 saturations>88%;Exhibits proper breathing techniques, such as pursed lip breathing or other method taught during program session;Compliance with respiratory medication;Demonstrates proper use of MDI's    Comments Desiree Barber does not wear oxygen during exercise at rehab and her oxygen stats have been WNL. She does have oxygen at home but does not need to wear it at home. She stated that she will wear it when she feels very SOB. She has albuterol inhaler and does use it as needed.    Goals/Expected Outcomes Short term:  Continue to wear her oxygen as prescribed and practice PLB   Long term:  Use albuterol as needed and continue PLB at home             Initial Exercise Prescription:  Initial Exercise Prescription - 08/22/22 1400       Date of Initial Exercise RX and Referring Provider   Date 08/22/22    Referring Provider Dr. Sherene Sires    Expected Discharge Date 01/03/23      Treadmill   MPH 1.3    Grade 0     Minutes 17      NuStep   Level 1    SPM 60    Minutes 22      Prescription Details   Frequency (times per week) 2    Duration Progress to 30 minutes of continuous aerobic without signs/symptoms  of physical distress      Intensity   THRR 40-80% of Max Heartrate 60-119    Ratings of Perceived Exertion 11-13    Perceived Dyspnea 0-4      Resistance Training   Training Prescription Yes    Weight 3    Reps 10-15             Perform Capillary Blood Glucose checks as needed.  Exercise Prescription Changes:   Exercise Prescription Changes     Row Name 08/28/22 1500 09/11/22 1500 09/25/22 1500 10/02/22 1500 10/23/22 1500     Response to Exercise   Blood Pressure (Admit) 112/58 120/60 120/60 112/62 130/60   Blood Pressure (Exercise) 126/60 110/58 130/60 124/74 130/60   Blood Pressure (Exit) 110/60 120/68 142/62 116/50 126/60   Heart Rate (Admit) 83 bpm 78 bpm 80 bpm 87 bpm 89 bpm   Heart Rate (Exercise) 86 bpm 95 bpm 100 bpm 96 bpm 100 bpm   Heart Rate (Exit) 85 bpm 77 bpm 85 bpm 81 bpm 90 bpm   Oxygen Saturation (Admit) 94 % 95 % 94 % 92 % 89 %   Oxygen Saturation (Exercise) 92 % 95 % 93 % 90 % 92 %   Oxygen Saturation (Exit) 95 % 96 % 94 % 94 % 93 %   Rating of Perceived Exertion (Exercise) 12 12 12 12 13    Perceived Dyspnea (Exercise) 12 12 12  0 1   Duration Continue with 30 min of aerobic exercise without signs/symptoms of physical distress. Continue with 30 min of aerobic exercise without signs/symptoms of physical distress. Continue with 30 min of aerobic exercise without signs/symptoms of physical distress. Continue with 30 min of aerobic exercise without signs/symptoms of physical distress. Continue with 30 min of aerobic exercise without signs/symptoms of physical distress.   Intensity THRR unchanged THRR unchanged THRR unchanged THRR unchanged THRR unchanged     Progression   Progression Continue to progress workloads to maintain intensity without signs/symptoms of  physical distress. Continue to progress workloads to maintain intensity without signs/symptoms of physical distress. Continue to progress workloads to maintain intensity without signs/symptoms of physical distress. Continue to progress workloads to maintain intensity without signs/symptoms of physical distress. Continue to progress workloads to maintain intensity without signs/symptoms of physical distress.     Resistance Training   Training Prescription Yes Yes Yes Yes Yes   Weight 2 2 3 3 4    Reps 10-15 10-15 10-15 10-15 10-15   Time 10 Minutes 10 Minutes 10 Minutes -- --     Treadmill   MPH 1.3 1.5 1.6 1.5 1.5   Grade 0 0 0 0 0   Minutes 17 17 17 15 15    METs 2 2.15 2.23 2.15 2.15     NuStep   Level 1 2 2 2 2    SPM 40 79 67 55 82   Minutes 22 22 22 15 15    METs 1.7 1.9 1.8 1.6 1.9     Oxygen   Maintain Oxygen Saturation -- -- -- 88% or higher 88% or higher    Row Name 11/22/22 1500 12/25/22 1300 01/24/23 1500         Response to Exercise   Blood Pressure (Admit) 118/64 104/64 124/62     Blood Pressure (Exit) 112/60 120/60 102/62     Heart Rate (Admit) 90 bpm 95 bpm 90 bpm     Heart Rate (Exercise) 116 bpm 88 bpm 91 bpm     Heart Rate (Exit) 88  bpm 78 bpm 95 bpm     Oxygen Saturation (Admit) 90 % 95 % 94 %     Oxygen Saturation (Exercise) 92 % 94 % 91 %     Oxygen Saturation (Exit) 95 % 96 % 95 %     Rating of Perceived Exertion (Exercise) 12 12 12      Perceived Dyspnea (Exercise) 1 2 0     Duration Continue with 30 min of aerobic exercise without signs/symptoms of physical distress. Continue with 30 min of aerobic exercise without signs/symptoms of physical distress. Continue with 30 min of aerobic exercise without signs/symptoms of physical distress.     Intensity THRR unchanged THRR unchanged THRR unchanged       Progression   Progression Continue to progress workloads to maintain intensity without signs/symptoms of physical distress. Continue to progress workloads to  maintain intensity without signs/symptoms of physical distress. Continue to progress workloads to maintain intensity without signs/symptoms of physical distress.       Resistance Training   Training Prescription Yes Yes Yes     Weight 4 / green band 4 lbs / green band 4 lbs / green band     Reps 10-15 10-15 10-15       Treadmill   MPH 1.7 -- --     Grade 0 -- --     Minutes 15 -- --     METs 2.3 -- --       NuStep   Level 3 2 1      SPM 63 47 79     Minutes 15 30 30      METs 1.6 1.5 1.6       Oxygen   Maintain Oxygen Saturation 88% or higher 88% or higher 88% or higher              Exercise Comments:   Exercise Goals and Review:   Exercise Goals     Row Name 08/22/22 1435 09/10/22 1412 10/09/22 1540         Exercise Goals   Increase Physical Activity Yes Yes Yes     Intervention Provide advice, education, support and counseling about physical activity/exercise needs.;Develop an individualized exercise prescription for aerobic and resistive training based on initial evaluation findings, risk stratification, comorbidities and participant's personal goals. Provide advice, education, support and counseling about physical activity/exercise needs.;Develop an individualized exercise prescription for aerobic and resistive training based on initial evaluation findings, risk stratification, comorbidities and participant's personal goals. Provide advice, education, support and counseling about physical activity/exercise needs.;Develop an individualized exercise prescription for aerobic and resistive training based on initial evaluation findings, risk stratification, comorbidities and participant's personal goals.     Expected Outcomes Short Term: Attend rehab on a regular basis to increase amount of physical activity.;Long Term: Exercising regularly at least 3-5 days a week.;Long Term: Add in home exercise to make exercise part of routine and to increase amount of physical activity.  Short Term: Attend rehab on a regular basis to increase amount of physical activity.;Long Term: Exercising regularly at least 3-5 days a week.;Long Term: Add in home exercise to make exercise part of routine and to increase amount of physical activity. Short Term: Attend rehab on a regular basis to increase amount of physical activity.;Long Term: Exercising regularly at least 3-5 days a week.;Long Term: Add in home exercise to make exercise part of routine and to increase amount of physical activity.     Increase Strength and Stamina Yes Yes Yes  Intervention Provide advice, education, support and counseling about physical activity/exercise needs.;Develop an individualized exercise prescription for aerobic and resistive training based on initial evaluation findings, risk stratification, comorbidities and participant's personal goals. Provide advice, education, support and counseling about physical activity/exercise needs.;Develop an individualized exercise prescription for aerobic and resistive training based on initial evaluation findings, risk stratification, comorbidities and participant's personal goals. Provide advice, education, support and counseling about physical activity/exercise needs.;Develop an individualized exercise prescription for aerobic and resistive training based on initial evaluation findings, risk stratification, comorbidities and participant's personal goals.     Expected Outcomes Short Term: Increase workloads from initial exercise prescription for resistance, speed, and METs.;Short Term: Perform resistance training exercises routinely during rehab and add in resistance training at home;Long Term: Improve cardiorespiratory fitness, muscular endurance and strength as measured by increased METs and functional capacity ( ) Short Term: Increase workloads from initial exercise prescription for resistance, speed, and METs.;Short Term: Perform resistance training exercises routinely during  rehab and add in resistance training at home;Long Term: Improve cardiorespiratory fitness, muscular endurance and strength as measured by increased METs and functional capacity ( ) Short Term: Increase workloads from initial exercise prescription for resistance, speed, and METs.;Short Term: Perform resistance training exercises routinely during rehab and add in resistance training at home;Long Term: Improve cardiorespiratory fitness, muscular endurance and strength as measured by increased METs and functional capacity ( )     Able to understand and use rate of perceived exertion (RPE) scale Yes Yes Yes     Intervention Provide education and explanation on how to use RPE scale Provide education and explanation on how to use RPE scale Provide education and explanation on how to use RPE scale     Expected Outcomes Short Term: Able to use RPE daily in rehab to express subjective intensity level;Long Term:  Able to use RPE to guide intensity level when exercising independently Short Term: Able to use RPE daily in rehab to express subjective intensity level;Long Term:  Able to use RPE to guide intensity level when exercising independently Short Term: Able to use RPE daily in rehab to express subjective intensity level;Long Term:  Able to use RPE to guide intensity level when exercising independently     Able to understand and use Dyspnea scale Yes Yes Yes     Intervention Provide education and explanation on how to use Dyspnea scale Provide education and explanation on how to use Dyspnea scale Provide education and explanation on how to use Dyspnea scale     Expected Outcomes Short Term: Able to use Dyspnea scale daily in rehab to express subjective sense of shortness of breath during exertion;Long Term: Able to use Dyspnea scale to guide intensity level when exercising independently Short Term: Able to use Dyspnea scale daily in rehab to express subjective sense of shortness of breath during exertion;Long  Term: Able to use Dyspnea scale to guide intensity level when exercising independently Short Term: Able to use Dyspnea scale daily in rehab to express subjective sense of shortness of breath during exertion;Long Term: Able to use Dyspnea scale to guide intensity level when exercising independently     Knowledge and understanding of Target Heart Rate Range (THRR) Yes Yes Yes     Intervention Provide education and explanation of THRR including how the numbers were predicted and where they are located for reference Provide education and explanation of THRR including how the numbers were predicted and where they are located for reference Provide education and explanation of THRR including how the numbers  were predicted and where they are located for reference     Expected Outcomes Short Term: Able to state/look up THRR;Long Term: Able to use THRR to govern intensity when exercising independently;Short Term: Able to use daily as guideline for intensity in rehab Short Term: Able to state/look up THRR;Long Term: Able to use THRR to govern intensity when exercising independently;Short Term: Able to use daily as guideline for intensity in rehab Short Term: Able to state/look up THRR;Long Term: Able to use THRR to govern intensity when exercising independently;Short Term: Able to use daily as guideline for intensity in rehab     Understanding of Exercise Prescription Yes Yes Yes     Intervention Provide education, explanation, and written materials on patient's individual exercise prescription Provide education, explanation, and written materials on patient's individual exercise prescription Provide education, explanation, and written materials on patient's individual exercise prescription     Expected Outcomes Short Term: Able to explain program exercise prescription;Long Term: Able to explain home exercise prescription to exercise independently Short Term: Able to explain program exercise prescription;Long Term: Able  to explain home exercise prescription to exercise independently Short Term: Able to explain program exercise prescription;Long Term: Able to explain home exercise prescription to exercise independently              Exercise Goals Re-Evaluation :  Exercise Goals Re-Evaluation     Row Name 09/10/22 1413 10/09/22 1541 10/18/22 1501 10/24/22 1351 11/13/22 0915     Exercise Goal Re-Evaluation   Exercise Goals Review Increase Physical Activity;Increase Strength and Stamina;Able to understand and use rate of perceived exertion (RPE) scale;Able to understand and use Dyspnea scale;Knowledge and understanding of Target Heart Rate Range (THRR);Understanding of Exercise Prescription Increase Physical Activity;Increase Strength and Stamina;Able to understand and use rate of perceived exertion (RPE) scale;Able to understand and use Dyspnea scale;Knowledge and understanding of Target Heart Rate Range (THRR);Understanding of Exercise Prescription Increase Physical Activity;Increase Strength and Stamina;Understanding of Exercise Prescription -- Increase Physical Activity;Understanding of Exercise Prescription;Increase Strength and Stamina   Comments Pt has completed 4 sessions of pulmonary rehab. She is very motivated to be in the program and to improve her health. She is tolerating exercise and increasing her workload. She sometimes has to use the head O2 band due to her nail polish and the meter not reading properly. She is currently exercising at 2.0 METs on the treadmill. Will continue to monitor and progress as able. Pt has completed 10 sessions of pulmonary rehab. She continues to be motivated to be in the program and is enjoying coming to class and exercising. She is increasing her workloads. She is currently exercising at 2.15 METs on the treadmill. Will continue to monitor and progress as able. Pt has complete 12 visits of PR. Pt states that she has seen improvment in her stamina and strgenth since starting  the program. She is able to walk furthur without taking extra breaks due to SOB. Desiree Barber has increased her SPM on the NuStep. She keeps her speed at 1.5 on the treadmill while RPE is an 13. WIll continue to monitor and progress as able Desiree Barber has been   Expected Outcomes Through exercise at rehab and home, patient will achieve their goals. Through exercise at rehab and home, patient will achieve their goals. Short term : go over home exercise  long term: keep improving workload and building endurance -- --    Row Name 11/22/22 1504 11/27/22 1429 12/13/22 1508 12/26/22 1343 01/22/23 1455     Exercise  Goal Re-Evaluation   Exercise Goals Review Increase Physical Activity;Increase Strength and Stamina;Understanding of Exercise Prescription Increase Physical Activity;Able to understand and use rate of perceived exertion (RPE) scale;Increase Strength and Stamina;Able to understand and use Dyspnea scale;Understanding of Exercise Prescription Increase Physical Activity;Increase Strength and Stamina;Understanding of Exercise Prescription Increase Physical Activity;Increase Strength and Stamina;Understanding of Exercise Prescription Increase Physical Activity;Increase Strength and Stamina;Understanding of Exercise Prescription   Comments Pt feels like she has gotten stronger and has more endurance since starting PR.  Pt has increased her speed to 1.7 on the treadmill and she is going to try to increase to level 3 on the Nustep today.  She is currently walking 2 days a week at home for about 10 min. Desiree Barber has been doing great with exericse. She has increased her speed on the treadmill to 1.7 and her level on the stepper to 3.0. Will continue to monitor and progress asa able Desiree Barber has been doing great with exercise. She has increased her level on the nustep to level 4. She went to see her pulmonologist and he wanted her to increase her workloads at rehab. She has been having increased weakness/cramping in her legs  when walking on the treadmill. She is going to see her vasicular doctor in the middle of October about the pain. She has been using the nustep twice due to pain walking on the treadmill. Desiree Barber has been tolerating exercise well. She has been dealing with pain in her legs when walking on the treadmill so she uses the nustep twice. She is going to see a Vascular doctor about her circulation within the next few weeks.She is doing well on the low impact equipment like the stepper. Will continue to monitor and progress as able. Desiree Barber has been doing well in rehab. She was dealing with pain in her legs when walking and feeling tired recently. She has her hemoglobin checked and was sent for 2 units of blood. Since then she stated that she has felt a whole lot better and has not been getting tired as fast. She was able to walk on the treadmill today for about 10 minutes and then sat down. She is getting her strength back up.   Expected Outcomes Short term:  Pt will try to increase to 15-20 min each time she walks at home         Long term:  Pt will continue to build on her walking at home and continue to come to PR Short term: increase walking grade to 0.5 - 1.0   long term: continue to attend PR Short term: continue to exercise on nustep and monitor leg cramping   long term: continue to attened pulmoanry rehab Short term: increase workload on the stepper in the next two weeks    long term: continue to attend rehab Short term: continue to slowy increase walking to build strength back up   long term: continue to attend rehab            Discharge Exercise Prescription (Final Exercise Prescription Changes):  Exercise Prescription Changes - 01/24/23 1500       Response to Exercise   Blood Pressure (Admit) 124/62    Blood Pressure (Exit) 102/62    Heart Rate (Admit) 90 bpm    Heart Rate (Exercise) 91 bpm    Heart Rate (Exit) 95 bpm    Oxygen Saturation (Admit) 94 %    Oxygen Saturation (Exercise) 91 %     Oxygen Saturation (Exit) 95 %  Rating of Perceived Exertion (Exercise) 12    Perceived Dyspnea (Exercise) 0    Duration Continue with 30 min of aerobic exercise without signs/symptoms of physical distress.    Intensity THRR unchanged      Progression   Progression Continue to progress workloads to maintain intensity without signs/symptoms of physical distress.      Resistance Training   Training Prescription Yes    Weight 4 lbs / green band    Reps 10-15      NuStep   Level 1    SPM 79    Minutes 30    METs 1.6      Oxygen   Maintain Oxygen Saturation 88% or higher             Nutrition:  Target Goals: Understanding of nutrition guidelines, daily intake of sodium 1500mg , cholesterol 200mg , calories 30% from fat and 7% or less from saturated fats, daily to have 5 or more servings of fruits and vegetables.  Biometrics:  Pre Biometrics - 08/22/22 1436       Pre Biometrics   Height 5\' 4"  (1.626 m)    Weight 57.6 kg    Waist Circumference 33 inches    Hip Circumference 37 inches    Waist to Hip Ratio 0.89 %    BMI (Calculated) 21.79    Triceps Skinfold 29 mm    % Body Fat 35.7 %    Grip Strength 21.2 kg    Flexibility 0 in    Single Leg Stand 10.43 seconds             Post Biometrics - 02/15/23 1031        Post  Biometrics   Height 5\' 4"  (1.626 m)    Weight 57.8 kg    Waist Circumference 35 inches    Hip Circumference 36 inches    Waist to Hip Ratio 0.97 %    BMI (Calculated) 21.86    Grip Strength 18.2 kg    Single Leg Stand 20 seconds             Nutrition Therapy Plan and Nutrition Goals:  Nutrition Therapy & Goals - 10/01/22 1419       Nutrition Therapy   RD appointment deferred Yes      Personal Nutrition Goals   Comments We provide educational sessions on heart healthy nutrition with handouts.      Intervention Plan   Intervention Nutrition handout(s) given to patient.    Expected Outcomes Short Term Goal: Understand basic  principles of dietary content, such as calories, fat, sodium, cholesterol and nutrients.             Nutrition Assessments:  Nutrition Assessments - 08/22/22 1336       MEDFICTS Scores   Pre Score 39            MEDIFICTS Score Key: >=70 Need to make dietary changes  40-70 Heart Healthy Diet <= 40 Therapeutic Level Cholesterol Diet   Picture Your Plate Scores: <16 Unhealthy dietary pattern with much room for improvement. 41-50 Dietary pattern unlikely to meet recommendations for good health and room for improvement. 51-60 More healthful dietary pattern, with some room for improvement.  >60 Healthy dietary pattern, although there may be some specific behaviors that could be improved.    Nutrition Goals Re-Evaluation:  Nutrition Goals Re-Evaluation     Row Name 10/18/22 1509 11/22/22 1526 12/13/22 1521 01/22/23 1515       Goals   Nutrition  Goal Healthy Eating -- Healthy Eating Healthy Eating    Comment States that she loves to eat ice cream and eats muiltiple times a week. Eats more chicken and fish then red meat. Pts wants to maintain weight and is fine with how she eats. Eats a good amount of veggies. Pt does not eat much red meat, because she prefers chicken and fish.  She states that the chicken she eats is grilled but the fish is fried.  Pt does not each much fruit but does eat a lot of vegetables.  She loves to eat sweets and eats something sweet every day. Desiree Barber has been watching her weight but has been gaining weight. She was recently taken off Trelegy and put on Anoro to be taken off the predison that is in Trelegy. She continues to eat more baked chicken and veggies for her meals. She has cut down on the amount of sweets that she eats. Desiree Barber has been watching her wight and has been staying around 125-130 lbs. She continues to eat backed chicken and veggies for her meals. She continues to work on her portion control. She has cut down on her sweets that she eats  within the week    Expected Outcome Short term goal:cut down on sweets to maintain wright    Long term goal : maintain healthy eat and weight Short term:  Pt will try to cut down on her sweets to 5 days a week vs 7  Long term:  Pt will try grilled/baked seafood to try to maintain a healthy balance of foods Short term: continue to watch amount of sweets   long term: continue to focus on healthy eating Short term: continue to watch amount of sweets   long term: continue to focus on healthy eating             Nutrition Goals Discharge (Final Nutrition Goals Re-Evaluation):  Nutrition Goals Re-Evaluation - 01/22/23 1515       Goals   Nutrition Goal Healthy Eating    Comment Desiree Barber has been watching her wight and has been staying around 125-130 lbs. She continues to eat backed chicken and veggies for her meals. She continues to work on her portion control. She has cut down on her sweets that she eats within the week    Expected Outcome Short term: continue to watch amount of sweets   long term: continue to focus on healthy eating             Psychosocial: Target Goals: Acknowledge presence or absence of significant depression and/or stress, maximize coping skills, provide positive support system. Participant is able to verbalize types and ability to use techniques and skills needed for reducing stress and depression.  Initial Review & Psychosocial Screening:  Initial Psych Review & Screening - 08/22/22 1328       Initial Review   Current issues with Current Sleep Concerns;Current Anxiety/Panic      Family Dynamics   Good Support System? Yes    Comments Pt has a good support sytem with her husband, her daughters, and her grandchildren.      Barriers   Psychosocial barriers to participate in program There are no identifiable barriers or psychosocial needs.      Screening Interventions   Interventions Encouraged to exercise;Provide feedback about the scores to participant;To  provide support and resources with identified psychosocial needs    Expected Outcomes Short Term goal: Utilizing psychosocial counselor, staff and physician to assist with identification of specific  Stressors or current issues interfering with healing process. Setting desired goal for each stressor or current issue identified.;Long Term Goal: Stressors or current issues are controlled or eliminated.;Short Term goal: Identification and review with participant of any Quality of Life or Depression concerns found by scoring the questionnaire.;Long Term goal: The participant improves quality of Life and PHQ9 Scores as seen by post scores and/or verbalization of changes             Quality of Life Scores:  Quality of Life - 08/22/22 1450       Quality of Life   Select Quality of Life      Quality of Life Scores   Health/Function Pre 22.53 %    Socioeconomic Pre 29.17 %    Psych/Spiritual Pre 29.64 %    Family Pre 28.8 %    GLOBAL Pre 26.09 %            Scores of 19 and below usually indicate a poorer quality of life in these areas.  A difference of  2-3 points is a clinically meaningful difference.  A difference of 2-3 points in the total score of the Quality of Life Index has been associated with significant improvement in overall quality of life, self-image, physical symptoms, and general health in studies assessing change in quality of life.   PHQ-9: Review Flowsheet       08/22/2022  Depression screen PHQ 2/9  Decreased Interest 0  Down, Depressed, Hopeless 0  PHQ - 2 Score 0  Altered sleeping 1  Tired, decreased energy 0  Change in appetite 0  Feeling bad or failure about yourself  0  Trouble concentrating 0  Moving slowly or fidgety/restless 0  Suicidal thoughts 0  PHQ-9 Score 1  Difficult doing work/chores Not difficult at all    Details           Interpretation of Total Score  Total Score Depression Severity:  1-4 = Minimal depression, 5-9 = Mild depression,  10-14 = Moderate depression, 15-19 = Moderately severe depression, 20-27 = Severe depression   Psychosocial Evaluation and Intervention:  Psychosocial Evaluation - 08/22/22 1419       Psychosocial Evaluation & Interventions   Interventions Encouraged to exercise with the program and follow exercise prescription;Stress management education;Relaxation education    Comments Pt has no identifiable psychosocial barriers to completing the PR program.  She is excited to start the program and improve her overall health.  She has a good support system in her husband, daughters, and grandchildren.  She scored a 1 on her PHQ-9 related to the fact that she has trouble falling asleep at times, but the pt does take a Xanax at night as needed to help her sleep and reduce anxiety.  Right now the pt feels like the Xanax is controlling her anxiety and insomnia.  She quit smoking in March of 2023 after smoking for 55 years.  She does have some lower back pain related to arthritis, and she is still having "heaviness" in her legs.  She is able to manage her back pain with Tylenol and stretching, and she hopes that once she loses some weight the leg "heaviness" will subside.  Her goals for the program are to breathe better with less shortness of breath and be able to walk/shop more.  We will monitor her progress as she works toward meeting these goals.    Expected Outcomes Pt's anxiety/insomnia will continue to be managed with Xanax, and she will continue to  have no idenfiable psychosocial issues.    Continue Psychosocial Services  No Follow up required             Psychosocial Re-Evaluation:  Psychosocial Re-Evaluation     Row Name 09/04/22 1155 10/01/22 1420 10/18/22 1506 11/22/22 1521 12/13/22 1518     Psychosocial Re-Evaluation   Current issues with Current Sleep Concerns Current Sleep Concerns Current Sleep Concerns Current Psychotropic Meds Current Psychotropic Meds;None Identified   Comments Patient is new  to the program completing 2 sessions.  She is currently is being treated via PCP for insomnia with xanax prn.  Patient was referred to PR by Dr. 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  Her initial PHQ-9 score is 1 and her QOL score is 26.09%.  We will continue to monitor  her progress as she works toward meeting these goals. Patient has completed 9 sessions. Her insomina is managed with Xanax prn. He continues to have no psychosocial barriers identified. She enjoys the program and demonstrates an interest in improving her health. We will continue to monitor her progress. Pt has seen an improvemnt in her sleep patterns, being able to have a comforable sleep night. Has been able to decrease medication usage to sleep. Pt states that she is sleeping fine now.  She only takes Xanax "every once in a while."  She denies any problems with anxiety/depression. Desiree Barber has been doing good in rehab. She continues to sleep better now and still only takes her Xanax "every once in a while". Her only worry lastly has been the increased cramping in her legs thats been bothering her. She has an upcoming appointment with her vasicular docotor in October.   Expected Outcomes Patient will have no psychosocial barriers identified at discharge. Patient will have no psychosocial barriers identified at discharge. Short term Goal: continue to improve sleep and decrease medication useage Long term Goals; Continue to have no barries identified Short term:   Continue to practice healthy sleeping habits   Long term:  Pt will continue to have no identifiable psychosocial barriers Short term: continue to monitor leg pain   long term: continue to have no psychosocial barriers and exercise for happiness   Interventions Relaxation education;Encouraged to attend Pulmonary Rehabilitation for the exercise;Stress management education Relaxation education;Encouraged to attend Pulmonary Rehabilitation for the exercise;Stress  management education Encouraged to attend Pulmonary Rehabilitation for the exercise;Relaxation education;Stress management education Stress management education;Relaxation education;Encouraged to attend Pulmonary Rehabilitation for the exercise Stress management education;Encouraged to attend Pulmonary Rehabilitation for the exercise;Relaxation education   Continue Psychosocial Services  No Follow up required No Follow up required Follow up required by staff Follow up required by staff Follow up required by staff    Row Name 01/22/23 1511             Psychosocial Re-Evaluation   Current issues with None Identified;Current Stress Concerns       Comments Desiree Barber was recently stressed due to needing 2 units of blood. Since then she has stated that shehas felt a whole lot better. Since then the past week she has not had any stressors.       Expected Outcomes Short term: continue to exercisce to build strength  Long termL conitnue to have no psycohosical barriers and exercise for happiness       Interventions Stress management education;Relaxation education;Encouraged to attend Pulmonary Rehabilitation for the exercise       Continue Psychosocial Services  Follow up required by staff  Psychosocial Discharge (Final Psychosocial Re-Evaluation):  Psychosocial Re-Evaluation - 01/22/23 1511       Psychosocial Re-Evaluation   Current issues with None Identified;Current Stress Concerns    Comments Desiree Barber was recently stressed due to needing 2 units of blood. Since then she has stated that shehas felt a whole lot better. Since then the past week she has not had any stressors.    Expected Outcomes Short term: continue to exercisce to build strength  Long termL conitnue to have no psycohosical barriers and exercise for happiness    Interventions Stress management education;Relaxation education;Encouraged to attend Pulmonary Rehabilitation for the exercise    Continue Psychosocial  Services  Follow up required by staff              Education: Education Goals: Education classes will be provided on a weekly basis, covering required topics. Participant will state understanding/return demonstration of topics presented.  Learning Barriers/Preferences:  Learning Barriers/Preferences - 08/22/22 1336       Learning Barriers/Preferences   Learning Barriers None    Learning Preferences Written Material             Education Topics: How Lungs Work and Diseases: - Discuss the anatomy of the lungs and diseases that can affect the lungs, such as COPD. Flowsheet Row PULMONARY REHAB OTHER RESPIRATORY from 11/29/2022 in [place] [facility]  Date 11/29/22  Educator [facility]  Instruction Review Code 1- Verbalizes Understanding       Exercise: -Discuss the importance of exercise, FITT principles of exercise, normal and abnormal responses to exercise, and how to exercise safely.   Environmental Irritants: -Discuss types of environmental irritants and how to limit exposure to environmental irritants.   Meds/Inhalers and oxygen: - Discuss respiratory medications, definition of an inhaler and oxygen, and the proper way to use an inhaler and oxygen.   Energy Saving Techniques: - Discuss methods to conserve energy and decrease shortness of breath when performing activities of daily living.  Flowsheet Row PULMONARY REHAB OTHER RESPIRATORY from 11/29/2022 in [place] [facility]  Date 10/18/22  Educator DM  Instruction Review Code 1- Verbalizes Understanding       Bronchial Hygiene / Breathing Techniques: - Discuss breathing mechanics, pursed-lip breathing technique,  proper posture, effective ways to clear airways, and other functional breathing techniques Flowsheet Row PULMONARY REHAB OTHER RESPIRATORY from 11/29/2022 in [place] [facility]  Date 10/25/22  Educator HB       Cleaning Equipment: - Provides group verbal and  written instruction about the health risks of elevated stress, cause of high stress, and healthy ways to reduce stress. Flowsheet Row PULMONARY REHAB OTHER RESPIRATORY from 11/29/2022 in [place] [facility]  Date 11/01/22  Educator [facility]  Instruction Review Code 1- Verbalizes Understanding       Nutrition I: Fats: - Discuss the types of cholesterol, what cholesterol does to the body, and how cholesterol levels can be controlled.   Nutrition II: Labels: -Discuss the different components of food labels and how to read food labels.   Respiratory Infections: - Discuss the signs and symptoms of respiratory infections, ways to prevent respiratory infections, and the importance of seeking medical treatment when having a respiratory infection. Flowsheet Row PULMONARY REHAB OTHER RESPIRATORY from 11/29/2022 in [place] [facility]  Date 08/30/22  Educator Handout       Stress I: Signs and Symptoms: - Discuss the causes of stress, how stress may lead to anxiety and depression, and ways to limit stress. Flowsheet  Row PULMONARY REHAB OTHER RESPIRATORY from 11/29/2022 in [place] [facility]  Date 09/06/22  Educator handout       Stress II: Relaxation: -Discuss relaxation techniques to limit stress. Flowsheet Row PULMONARY REHAB OTHER RESPIRATORY from 11/29/2022 in [place] [facility]  Date 09/13/22  Educator handout       Oxygen for Home/Travel: - Discuss how to prepare for travel when on oxygen and proper ways to transport and store oxygen to ensure safety. Flowsheet Row PULMONARY REHAB OTHER RESPIRATORY from 11/29/2022 in [place] [facility]  Date 09/20/22  Educator handout       Knowledge Questionnaire Score:  Knowledge Questionnaire Score - 08/22/22 1337       Knowledge Questionnaire Score   Pre Score 12/18             Core Components/Risk Factors/Patient Goals at Admission:  Personal Goals and Risk  Factors at Admission - 08/22/22 1342       Core Components/Risk Factors/Patient Goals on Admission    Weight Management Weight Loss    Improve shortness of breath with ADL's Yes    Intervention Provide education, individualized exercise plan and daily activity instruction to help decrease symptoms of SOB with activities of daily living.    Expected Outcomes Short Term: Improve cardiorespiratory fitness to achieve a reduction of symptoms when performing ADLs;Long Term: Be able to perform more ADLs without symptoms or delay the onset of symptoms    Increase knowledge of respiratory medications and ability to use respiratory devices properly  Yes    Intervention Provide education and demonstration as needed of appropriate use of medications, inhalers, and oxygen therapy.    Expected Outcomes Short Term: Achieves understanding of medications use. Understands that oxygen is a medication prescribed by physician. Demonstrates appropriate use of inhaler and oxygen therapy.;Long Term: Maintain appropriate use of medications, inhalers, and oxygen therapy.    Hypertension Yes    Intervention Provide education on lifestyle modifcations including regular physical activity/exercise, weight management, moderate sodium restriction and increased consumption of fresh fruit, vegetables, and low fat dairy, alcohol moderation, and smoking cessation.    Expected Outcomes Short Term: Continued assessment and intervention until BP is < 140/42mm HG in hypertensive participants. < 130/49mm HG in hypertensive participants with diabetes, heart failure or chronic kidney disease.;Long Term: Maintenance of blood pressure at goal levels.    Personal Goal Other Yes    Personal Goal Pt wants to breathe better with less shortness of breath and be able to walk more and shop.    Intervention Pt will attend PR twice a week and start a home exercise program.    Expected Outcomes Pt will attend PR twice a week and complete the program  meeting both her personal and program goals.             Core Components/Risk Factors/Patient Goals Review:   Goals and Risk Factor Review     Row Name 09/04/22 1206 10/01/22 1423 10/18/22 1515 11/22/22 1534 12/13/22 1529     Core Components/Risk Factors/Patient Goals Review   Personal Goals Review Improve shortness of breath with ADL's;Develop more efficient breathing techniques such as purse lipped breathing and diaphragmatic breathing and practicing self-pacing with activity. Improve shortness of breath with ADL's;Develop more efficient breathing techniques such as purse lipped breathing and diaphragmatic breathing and practicing self-pacing with activity. Improve shortness of breath with ADL's;Develop more efficient breathing techniques such as purse lipped breathing and diaphragmatic breathing and practicing self-pacing with activity. Weight Management/Obesity;Hypertension;Increase  knowledge of respiratory medications and ability to use respiratory devices properly.;Develop more efficient breathing techniques such as purse lipped breathing and diaphragmatic breathing and practicing self-pacing with activity. Weight Management/Obesity;Hypertension;Increase knowledge of respiratory medications and ability to use respiratory devices properly.;Develop more efficient breathing techniques such as purse lipped breathing and diaphragmatic breathing and practicing self-pacing with activity.   Review Patient is new to the PR program attending 2 sessions.  She seems to enjoy class and interactive with class and staff.  Patient was referred to PR by Dr. 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  Her personal goals for the program are to have less SOB , breath better and be abe to walk more while shopping.  She tolerates exercise well while on Treadmill and nu-step with O2 sats maintaining at 92% without any oxygen supplemental needed at this time.  We will continue to monitor as she  works toward meeting these goals as she progresses in the program. Patient had completed 9 sessions. She is doing well in the program with consistent attendance. Her current weight is 57.3 KG down 0.9 KG from last 30 day review. She tolerates exercise well while on Treadmill and nu-step with O2 sats maintaining at 93% without any oxygen supplemental needed at this time. Her personal goals for the program are to have less SOB , breath better and be abe to walk more while shopping. We will continue to monitor as she works toward meeting these goals as she progresses in the program. Pt has been able to improve her SOB with her ADL's. Was able to shop with family and not have to take muilple breaks. Have not needed to use pursed lip breathing tech, has not pushed herself to the limit to need inhaler. Pt's weight has fluctuated but she wants to get down to 120 lbs.  Her current weight is 129 lbs and she knows that she needs to cut down on her sweets in order to lose weight.  She also mentioned that if she lost the extra weight that might make her legs feel less tired.  She is no longer on any BP medication. Desiree Barber continues to [employer] her weight, she is staying between 128lbs to 131lbs. She has been taken off a medication by her pulmonologist that might help with her weight. She continues to watch what she eats to manage her weight. She continues to have leg pain and is going to see her vasicular doctor about the leg pain.   Expected Outcomes Patient will complete the program meeting both program and personal goals. Patient will complete the program meeting both program and personal goals. short term goal: going over pursed lip breathing long term goals: continue to improve SOB with ADL's Short term:  Pt is going to try to adjust her diet to lose weight in order to decrease her SOB even more  Long term:  Continue to have a positive attitude and willingness to improve her overall health Short term: continue to manage  weight   long term: continue to have a positive attitude and willingness to help improve her overall health.    Row Name 01/22/23 1518             Core Components/Risk Factors/Patient Goals Review   Personal Goals Review Weight Management/Obesity;Improve shortness of breath with ADL's       Review Desiree Barber continues to monitor her weight and staying between 128lbs to 130lbs. She is watching what she eats and woking on portion  size for weight managment. She is no longer having the leg pain due to needing 2 units of blood. Since then she has been feeling a whole lot better.       Expected Outcomes Short term: continue to manage weight   long term: continue to have a positive attitude and willingness to help improve her overall health.                Core Components/Risk Factors/Patient Goals at Discharge (Final Review):   Goals and Risk Factor Review - 01/22/23 1518       Core Components/Risk Factors/Patient Goals Review   Personal Goals Review Weight Management/Obesity;Improve shortness of breath with ADL's    Review Desiree Barber continues to monitor her weight and staying between 128lbs to 130lbs. She is watching what she eats and woking on portion size for weight managment. She is no longer having the leg pain due to needing 2 units of blood. Since then she has been feeling a whole lot better.    Expected Outcomes Short term: continue to manage weight   long term: continue to have a positive attitude and willingness to help improve her overall health.             ITP Comments:  ITP Comments     Row Name 10/10/22 1613 11/07/22 1519 12/05/22 1526 01/02/23 1640 01/30/23 0825   ITP Comments 30 day review completed. ITP sent to Dr.Jehanzeb Memon, Medical Director of  Pulmonary Rehab. Continue with ITP unless changes are made by physician. 30 day review completed. ITP sent to Dr.Jehanzeb Memon, Medical Director of  Pulmonary Rehab. Continue with ITP unless changes are made by physician. 30  day review completed. ITP sent to Dr.Jehanzeb Memon, Medical Director of  Pulmonary Rehab. Continue with ITP unless changes are made by physician. 30 day review completed. ITP sent to Dr.Jehanzeb Memon, Medical Director of  Pulmonary Rehab. Continue with ITP unless changes are made by physician. 30 day review completed. ITP sent to Dr.Jehanzeb Memon, Medical Director of  Pulmonary Rehab. Continue with ITP unless changes are made by physician.    Row Name 02/15/23 1026           ITP Comments Desiree Barber graduated today from  rehab with 36 sessions completed.  Details of the patient's exercise prescription and what She needs to do in order to continue the prescription and progress were discussed with patient.  Patient was given a copy of prescription and goals.  Patient verbalized understanding. Desiree Barber plans to continue to exercise by walking at home and joining maintenance.                Comments: Discharge ITP

## 2023-02-15 NOTE — Progress Notes (Signed)
Discharge Progress Report  Patient Details  Name: Desiree Barber MRN: [id number] Date of Birth: 02-23-1951 Referring Provider:   Flowsheet Row PULMONARY REHAB OTHER RESP ORIENTATION from 08/22/2022 in [facility]  Referring Provider Dr. Sherene Sires        Number of Visits: 36  Reason for Discharge:  Patient reached a stable level of exercise. Patient independent in their exercise. Patient has met program and personal goals.  Smoking History:  Social History   Tobacco Use  Smoking Status Former   Current packs/day: 0.00   Average packs/day: 0.5 packs/day for 55.0 years (27.5 ttl pk-yrs)   Types: Cigarettes   Start date: 05/30/1966   Quit date: 05/29/2021   Years since quitting: 1.7   Passive exposure: Past  Smokeless Tobacco Former  Tobacco Comments   Quit date 05/29/2021    Diagnosis:  Chronic respiratory failure with hypoxia (HCC)  COPD mixed type (HCC)  ADL [facility]:  Pulmonary Assessment Scores     Row Name 08/22/22 1314         ADL [facility]   SOB Score total 34     Rest 0     Walk 3     Stairs 4     Bath 0     Dress 0     Shop 2       CAT Score   CAT Score 15       mMRC Score   mMRC Score 1              Initial Exercise Prescription:  Initial Exercise Prescription - 08/22/22 1400       Date of Initial Exercise RX and Referring Provider   Date 08/22/22    Referring Provider Dr. Sherene Sires    Expected Discharge Date 01/03/23      Treadmill   MPH 1.3    Grade 0    Minutes 17      NuStep   Level 1    SPM 60    Minutes 22      Prescription Details   Frequency (times per week) 2    Duration Progress to 30 minutes of continuous aerobic without signs/symptoms of physical distress      Intensity   THRR 40-80% of Max Heartrate 60-119    Ratings of Perceived Exertion 11-13    Perceived Dyspnea 0-4      Resistance Training   Training Prescription Yes    Weight 3    Reps 10-15             Discharge Exercise Prescription  (Final Exercise Prescription Changes):  Exercise Prescription Changes - 01/24/23 1500       Response to Exercise   Blood Pressure (Admit) 124/62    Blood Pressure (Exit) 102/62    Heart Rate (Admit) 90 bpm    Heart Rate (Exercise) 91 bpm    Heart Rate (Exit) 95 bpm    Oxygen Saturation (Admit) 94 %    Oxygen Saturation (Exercise) 91 %    Oxygen Saturation (Exit) 95 %    Rating of Perceived Exertion (Exercise) 12    Perceived Dyspnea (Exercise) 0    Duration Continue with 30 min of aerobic exercise without signs/symptoms of physical distress.    Intensity THRR unchanged      Progression   Progression Continue to progress workloads to maintain intensity without signs/symptoms of physical distress.      Resistance Training   Training Prescription Yes    Weight 4  lbs / green band    Reps 10-15      NuStep   Level 1    SPM 79    Minutes 30    METs 1.6      Oxygen   Maintain Oxygen Saturation 88% or higher             Functional Capacity:  6 Minute Walk     Row Name 08/22/22 1432 02/15/23 1030       6 Minute Walk   Phase Initial Discharge    Distance 800 feet 950 feet    Walk Time 6 minutes 6 minutes    # of Rest Breaks 1 1    MPH 1.5 1.8    METS 2.38 2.45    RPE 13 13    Perceived Dyspnea  13 1    VO2 Peak 8.33 8.57    Symptoms Yes (comment) Yes (comment)    Comments pt needed one sittting break due to SOB pt needed one sittting break due to SOB    Resting HR 81 bpm 77 bpm    Resting BP 134/60 120/60    Resting Oxygen Saturation  92 % 94 %    Exercise Oxygen Saturation  during 6 min walk 80 % 89 %    Max Ex. HR 109 bpm 101 bpm    Max Ex. BP 140/68 126/70    2 Minute Post BP 136/60 124/70      Interval HR   1 Minute HR 93 94    2 Minute HR 104 90    3 Minute HR 103 94    4 Minute HR 109 92    5 Minute HR 103 93    6 Minute HR 105 101    2 Minute Post HR 91 82    Interval Heart Rate? Yes Yes      Interval Oxygen   Interval Oxygen? Yes Yes     Baseline Oxygen Saturation % 92 % 94 %    1 Minute Oxygen Saturation % 90 % 93 %    1 Minute Liters of Oxygen 0 L 0 L    2 Minute Oxygen Saturation % 85 % 93 %    2 Minute Liters of Oxygen 0 L 0 L    3 Minute Oxygen Saturation % 88 % 93 %    3 Minute Liters of Oxygen 0 L 0 L    4 Minute Oxygen Saturation % 83 % 90 %    4 Minute Liters of Oxygen -- 0 L    5 Minute Oxygen Saturation % 81 % 92 %    5 Minute Liters of Oxygen -- 0 L    6 Minute Oxygen Saturation % 80 % 89 %    6 Minute Liters of Oxygen 0 L 0 L    2 Minute Post Oxygen Saturation % 92 % 95 %    2 Minute Post Liters of Oxygen 0 L 0 L             Psychological, QOL, Others - Outcomes: PHQ 2/9:    08/22/2022    1:07 PM  Depression screen PHQ 2/9  Decreased Interest 0  Down, Depressed, Hopeless 0  PHQ - 2 Score 0  Altered sleeping 1  Tired, decreased energy 0  Change in appetite 0  Feeling bad or failure about yourself  0  Trouble concentrating 0  Moving slowly or fidgety/restless 0  Suicidal thoughts 0  PHQ-9 Score 1  Difficult doing work/chores Not difficult at all     Nutrition & Weight - Outcomes:  Pre Biometrics - 08/22/22 1436       Pre Biometrics   Height 5\' 4"  (1.626 m)    Weight 57.6 kg    Waist Circumference 33 inches    Hip Circumference 37 inches    Waist to Hip Ratio 0.89 %    BMI (Calculated) 21.79    Triceps Skinfold 29 mm    % Body Fat 35.7 %    Grip Strength 21.2 kg    Flexibility 0 in    Single Leg Stand 10.43 seconds             Post Biometrics - 02/15/23 1031        Post  Biometrics   Height 5\' 4"  (1.626 m)    Weight 57.8 kg    Waist Circumference 35 inches    Hip Circumference 36 inches    Waist to Hip Ratio 0.97 %    BMI (Calculated) 21.86    Grip Strength 18.2 kg    Single Leg Stand 20 seconds             Nutrition:  Nutrition Therapy & Goals - 10/01/22 1419       Nutrition Therapy   RD appointment deferred Yes      Personal Nutrition Goals    Comments We provide educational sessions on heart healthy nutrition with handouts.      Intervention Plan   Intervention Nutrition handout(s) given to patient.    Expected Outcomes Short Term Goal: Understand basic principles of dietary content, such as calories, fat, sodium, cholesterol and nutrients.             Nutrition Discharge:  Nutrition Assessments - 08/22/22 1336       MEDFICTS Scores   Pre Score 39             Education Questionnaire Score:  Knowledge Questionnaire Score - 08/22/22 1337       Knowledge Questionnaire Score   Pre Score 12/18

## 2023-02-19 ENCOUNTER — Encounter (HOSPITAL_COMMUNITY): Payer: Medicare Other

## 2023-02-23 DIAGNOSIS — D509 Iron deficiency anemia, unspecified: Secondary | ICD-10-CM | POA: Diagnosis not present

## 2023-02-23 DIAGNOSIS — J449 Chronic obstructive pulmonary disease, unspecified: Secondary | ICD-10-CM | POA: Diagnosis not present

## 2023-03-13 DIAGNOSIS — S6701XA Crushing injury of right thumb, initial encounter: Secondary | ICD-10-CM | POA: Diagnosis not present

## 2023-03-13 DIAGNOSIS — S62501A Fracture of unspecified phalanx of right thumb, initial encounter for closed fracture: Secondary | ICD-10-CM | POA: Diagnosis not present

## 2023-03-14 ENCOUNTER — Ambulatory Visit (INDEPENDENT_AMBULATORY_CARE_PROVIDER_SITE_OTHER): Payer: Medicare Other | Admitting: Orthopaedic Surgery

## 2023-03-14 ENCOUNTER — Encounter: Payer: Self-pay | Admitting: Hematology

## 2023-03-14 ENCOUNTER — Other Ambulatory Visit (INDEPENDENT_AMBULATORY_CARE_PROVIDER_SITE_OTHER): Payer: Medicare Other

## 2023-03-14 ENCOUNTER — Encounter: Payer: Self-pay | Admitting: Orthopaedic Surgery

## 2023-03-14 VITALS — BP 168/94 | HR 87 | Ht 64.0 in | Wt 128.8 lb

## 2023-03-14 DIAGNOSIS — S62521A Displaced fracture of distal phalanx of right thumb, initial encounter for closed fracture: Secondary | ICD-10-CM | POA: Diagnosis not present

## 2023-03-14 DIAGNOSIS — M79644 Pain in right finger(s): Secondary | ICD-10-CM

## 2023-03-14 NOTE — Progress Notes (Signed)
Subjective:    Patient ID: Desiree Barber, female    DOB: January 07, 1951, 72 y.o.   MRN: [id number]  HPI She shut her right dominant thumb in a car door yesterday.  She was seen at [facility].  She was told she had a fracture of the thumb.  I have the notes but not the X-rays and the report just says fracture unspecified of the thumb on the right.  I do not know if the fracture extends into the joint and I will get X-rays here today.  She has no other injury.  Her nail is loose but she declines to have it removed.  Review of Systems  Constitutional:  Positive for activity change.  Musculoskeletal:  Positive for arthralgias.  All other systems reviewed and are negative. For Review of Systems, all other systems reviewed and are negative.  The following is a summary of the past history medically, past history surgically, known current medicines, social history and family history.  This information is gathered electronically by the computer from prior information and documentation.  I review this each visit and have found including this information at this point in the chart is beneficial and informative.   Past Medical History:  Diagnosis Date   Anemia    Anxiety    Occasional   Arthritis    COPD (chronic obstructive pulmonary disease) (HCC)    HTN (hypertension)    Iron deficiency anemia due to chronic blood loss 03/28/2022   Peripheral arterial disease [facility])     Past Surgical History:  Procedure Laterality Date   ABDOMINAL AORTOGRAM W/LOWER EXTREMITY N/A 04/10/2022   Procedure: ABDOMINAL AORTOGRAM W/LOWER EXTREMITY;  Surgeon: Nada Libman, MD;  [facility];  Service: Cardiovascular;  Laterality: N/A;   BIOPSY  08/27/2022   Procedure: BIOPSY;  Surgeon: Lanelle Bal, DO;  [facility];  Service: Endoscopy;;   COLONOSCOPY  02/23/04   RMR: normal rectum and colon.minimal internal hemorrhoids   COLONOSCOPY WITH PROPOFOL N/A 06/13/2015    Procedure: COLONOSCOPY WITH PROPOFOL;  Surgeon: Corbin Ade, MD;  [facility];  Service: Endoscopy;  Laterality: N/A;  1045   COLONOSCOPY WITH PROPOFOL N/A 08/27/2022   Procedure: COLONOSCOPY WITH PROPOFOL;  Surgeon: Lanelle Bal, DO;  [facility];  Service: Endoscopy;  Laterality: N/A;  8:45 am, asa 3   ESOPHAGOGASTRODUODENOSCOPY (EGD) WITH PROPOFOL N/A 08/27/2022   Procedure: ESOPHAGOGASTRODUODENOSCOPY (EGD) WITH PROPOFOL;  Surgeon: Lanelle Bal, DO;  [facility];  Service: Endoscopy;  Laterality: N/A;   GIVENS CAPSULE STUDY N/A 01/29/2023   Procedure: GIVENS CAPSULE STUDY;  Surgeon: Lanelle Bal, DO;  [facility];  Service: Endoscopy;  Laterality: N/A;  730am   HOT HEMOSTASIS  08/27/2022   Procedure: HOT HEMOSTASIS (ARGON PLASMA COAGULATION/BICAP);  Surgeon: Lanelle Bal, DO;  [facility];  Service: Endoscopy;;   POLYPECTOMY  06/13/2015   Procedure: POLYPECTOMY;  Surgeon: Corbin Ade, MD;  [facility];  Service: Endoscopy;;  sigmoid colon polyp   TOOTH EXTRACTION     TRIGGER FINGER RELEASE Bilateral    TUBAL LIGATION      Current Outpatient Medications on File Prior to Visit  Medication Sig Dispense Refill   albuterol (PROVENTIL) (2.5 MG/3ML) 0.083% nebulizer solution Up to every 4 hours as needed 75 mL 12   albuterol (VENTOLIN HFA) 108 (90 Base) MCG/ACT inhaler Inhale into the lungs.  ALPRAZolam (XANAX) 0.25 MG tablet Take 0.25 mg by mouth daily as needed for anxiety or sleep.     Ascorbic Acid (VITAMIN C) 100 MG tablet Take 100 mg by mouth daily.     clopidogrel (PLAVIX) 75 MG tablet Take 1 tablet (75 mg total) by mouth daily. 30 tablet 11   ferrous sulfate 324 (65 Fe) MG TBEC Take 1 tablet (325 mg total) by mouth daily with breakfast. 30 tablet 11   OVER THE COUNTER MEDICATION Otc iron one daily     pantoprazole (PROTONIX) 40 MG tablet Take 1 tablet (40 mg total) by mouth 2 (two) times daily.  60 tablet 11   umeclidinium-vilanterol (ANORO ELLIPTA) 62.5-25 MCG/ACT AEPB Inhale 1 puff into the lungs daily. 1 each 11   vitamin B-12 (CYANOCOBALAMIN) 100 MCG tablet Take 100 mcg by mouth daily.     VITAMIN D, CHOLECALCIFEROL, PO Take 1 tablet by mouth daily. One daily     No current facility-administered medications on file prior to visit.    Social History   Socioeconomic History   Marital status: Married    Spouse name: Not on file   Number of children: Not on file   Years of education: Not on file   Highest education level: Not on file  Occupational History   Not on file  Tobacco Use   Smoking status: Former    Current packs/day: 0.00    Average packs/day: 0.5 packs/day for 55.0 years (27.5 ttl pk-yrs)    Types: Cigarettes    Start date: 05/30/1966    Quit date: 05/29/2021    Years since quitting: 1.7    Passive exposure: Past   Smokeless tobacco: Former   Tobacco comments:    Quit date 05/29/2021  Vaping Use   Vaping status: Never Used  Substance and Sexual Activity   Alcohol use: Yes    Alcohol/week: 0.0 standard drinks of alcohol    Comment: 3 beers a day   Drug use: No   Sexual activity: Yes    Birth control/protection: Surgical  Other Topics Concern   Not on file  Social History Narrative   Not on file   Social Drivers of Health   Financial Resource Strain: Not on file  Food Insecurity: Unknown (12/24/2021)   Hunger Vital Sign    Worried About Running Out of Food in the Last Year: Never true    Ran Out of Food in the Last Year: Not on file  Transportation Needs: No Transportation Needs (12/24/2021)   PRAPARE - Administrator, Civil Service (Medical): No    Lack of Transportation (Non-Medical): No  Physical Activity: Not on file  Stress: Not on file  Social Connections: Unknown (08/07/2021)   Received from [facility]   Social Network    Social Network: Not on file  Intimate Partner Violence: Not At Risk (12/24/2021)    Humiliation, Afraid, Rape, and Kick questionnaire    Fear of Current or Ex-Partner: No    Emotionally Abused: No    Physically Abused: No    Sexually Abused: No    Family History  Problem Relation Age of Onset   Colon cancer Neg Hx    Breast cancer Neg Hx     BP (!) 168/94   Pulse 87   Ht 5\' 4"  (1.626 m)   Wt 128 lb 12.8 oz (58.4 kg)   BMI 22.11 kg/m   Body mass index is 22.11 kg/m.  Objective:   Physical Exam Vitals and nursing note reviewed. Exam conducted with a chaperone present.  Constitutional:      Appearance: She is well-developed.  HENT:     Head: Normocephalic and atraumatic.  Eyes:     Conjunctiva/sclera: Conjunctivae normal.     Pupils: Pupils are equal, round, and reactive to light.  Cardiovascular:     Rate and Rhythm: Normal rate and regular rhythm.  Pulmonary:     Effort: Pulmonary effort is normal.  Abdominal:     Palpations: Abdomen is soft.  Musculoskeletal:       Hands:     Cervical back: Normal range of motion and neck supple.  Skin:    General: Skin is warm and dry.  Neurological:     Mental Status: She is alert and oriented to person, place, and time.     Cranial Nerves: No cranial nerve deficit.     Motor: No abnormal muscle tone.     Coordination: Coordination normal.     Deep Tendon Reflexes: Reflexes are normal and symmetric. Reflexes normal.  Psychiatric:        Behavior: Behavior normal.        Thought Content: Thought content normal.        Judgment: Judgment normal.    X-rays were done of the right thumb, reported separately.       Assessment & Plan:   Encounter Diagnoses  Name Primary?   Pain of right thumb Yes   Closed displaced fracture of distal phalanx of right thumb, initial encounter    She was given a thumb splint, molded to the thumb  Return in three weeks.  X-rays then.  Call if any problem.  Precautions discussed.  Electronically Signed Darreld Mclean, MD 12/19/20249:20 AM

## 2023-03-22 ENCOUNTER — Encounter (HOSPITAL_COMMUNITY)
Admission: RE | Admit: 2023-03-22 | Discharge: 2023-03-22 | Disposition: A | Discharge status: Home / Self Care | Payer: Medicare Other | Source: Ambulatory Visit | Attending: Internal Medicine

## 2023-03-22 ENCOUNTER — Other Ambulatory Visit: Payer: Self-pay

## 2023-03-22 ENCOUNTER — Encounter (HOSPITAL_COMMUNITY): Payer: Self-pay

## 2023-03-25 ENCOUNTER — Encounter (HOSPITAL_COMMUNITY)
Admission: RE | Disposition: A | Discharge status: Home / Self Care | Payer: Self-pay | Source: Home / Self Care | Attending: Internal Medicine

## 2023-03-25 ENCOUNTER — Ambulatory Visit (HOSPITAL_COMMUNITY)
Admission: RE | Admit: 2023-03-25 | Discharge: 2023-03-25 | Disposition: A | Discharge status: Home / Self Care | Payer: Medicare Other | Attending: Internal Medicine | Admitting: Internal Medicine

## 2023-03-25 ENCOUNTER — Ambulatory Visit (HOSPITAL_BASED_OUTPATIENT_CLINIC_OR_DEPARTMENT_OTHER): Payer: Medicare Other | Admitting: Anesthesiology

## 2023-03-25 ENCOUNTER — Encounter (HOSPITAL_COMMUNITY): Payer: Self-pay

## 2023-03-25 ENCOUNTER — Ambulatory Visit (HOSPITAL_COMMUNITY): Payer: Medicare Other | Admitting: Anesthesiology

## 2023-03-25 DIAGNOSIS — K449 Diaphragmatic hernia without obstruction or gangrene: Secondary | ICD-10-CM | POA: Diagnosis not present

## 2023-03-25 DIAGNOSIS — K31819 Angiodysplasia of stomach and duodenum without bleeding: Secondary | ICD-10-CM | POA: Insufficient documentation

## 2023-03-25 DIAGNOSIS — Z87891 Personal history of nicotine dependence: Secondary | ICD-10-CM | POA: Insufficient documentation

## 2023-03-25 DIAGNOSIS — I739 Peripheral vascular disease, unspecified: Secondary | ICD-10-CM | POA: Insufficient documentation

## 2023-03-25 DIAGNOSIS — J449 Chronic obstructive pulmonary disease, unspecified: Secondary | ICD-10-CM | POA: Insufficient documentation

## 2023-03-25 DIAGNOSIS — D500 Iron deficiency anemia secondary to blood loss (chronic): Secondary | ICD-10-CM | POA: Diagnosis not present

## 2023-03-25 DIAGNOSIS — K3189 Other diseases of stomach and duodenum: Secondary | ICD-10-CM

## 2023-03-25 DIAGNOSIS — I10 Essential (primary) hypertension: Secondary | ICD-10-CM | POA: Diagnosis not present

## 2023-03-25 DIAGNOSIS — K2970 Gastritis, unspecified, without bleeding: Secondary | ICD-10-CM

## 2023-03-25 DIAGNOSIS — D509 Iron deficiency anemia, unspecified: Secondary | ICD-10-CM

## 2023-03-25 DIAGNOSIS — K2950 Unspecified chronic gastritis without bleeding: Secondary | ICD-10-CM | POA: Diagnosis not present

## 2023-03-25 DIAGNOSIS — Z8619 Personal history of other infectious and parasitic diseases: Secondary | ICD-10-CM | POA: Insufficient documentation

## 2023-03-25 HISTORY — PX: BIOPSY: SHX5522

## 2023-03-25 HISTORY — PX: HOT HEMOSTASIS: SHX5433

## 2023-03-25 HISTORY — PX: ENTEROSCOPY: SHX5533

## 2023-03-25 SURGERY — ENTEROSCOPY
Anesthesia: General

## 2023-03-25 MED ORDER — PROPOFOL 10 MG/ML IV BOLUS
INTRAVENOUS | Status: DC | PRN
  Administered 2023-03-25: 30 mg via INTRAVENOUS
  Administered 2023-03-25: 40 mg via INTRAVENOUS
  Administered 2023-03-25: 100 mg via INTRAVENOUS
  Administered 2023-03-25 (×3): 30 mg via INTRAVENOUS

## 2023-03-25 MED ORDER — LACTATED RINGERS IV SOLN: INTRAVENOUS | Status: DC

## 2023-03-25 MED ORDER — LIDOCAINE HCL (CARDIAC) PF 100 MG/5ML IV SOSY
PREFILLED_SYRINGE | INTRAVENOUS | Status: DC | PRN
  Administered 2023-03-25: 50 mg via INTRAVENOUS

## 2023-03-25 NOTE — Transfer of Care (Signed)
Immediate Anesthesia Transfer of Care Note  Patient: Desiree Barber  Procedure(s) Performed: ENTEROSCOPY HOT HEMOSTASIS (ARGON PLASMA COAGULATION/BICAP) BIOPSY  Patient Location: Short Stay  Anesthesia Type:General  Level of Consciousness: awake and patient cooperative  Airway & Oxygen Therapy: Patient Spontanous Breathing  Post-op Assessment: Report given to RN and Post -op Vital signs reviewed and stable  Post vital signs: Reviewed and stable  Last Vitals:  Vitals Value Taken Time  BP 141/79 03/25/23 1209  Temp 36.6 C 03/25/23 1209  Pulse 93 03/25/23 1209  Resp 21 03/25/23 1209  SpO2 94 % 03/25/23 1209    Last Pain:  Vitals:   03/25/23 1209  TempSrc: Oral  PainSc: 0-No pain         Complications: No notable events documented.

## 2023-03-25 NOTE — Op Note (Signed)
[facility] Patient Name: Desiree Barber Procedure Date: 03/25/2023 11:25 AM MRN: [id number] Date of Birth: 10/03/1950 Attending MD: Hennie Duos. Marletta Lor , [place], [id number] CSN: [id number] Age: 72 Admit Type: Outpatient Procedure:                Small bowel enteroscopy Indications:              Iron deficiency anemia secondary to chronic blood                            loss, Follow-up of Helicobacter pylori Providers:                Hennie Duos. Marletta Lor, DO, Emilee Tubb RN, RN, Lafonda Mosses, Technician Referring MD:              Medicines:                See the Anesthesia note for documentation of the                            administered medications Complications:            No immediate complications. Estimated Blood Loss:     Estimated blood loss was minimal. Procedure:                Pre-Anesthesia Assessment:                           - The anesthesia plan was to use monitored                            anesthesia care (MAC).                           After obtaining informed consent, the endoscope was                            passed under direct vision. Throughout the                            procedure, the patient's blood pressure, pulse, and                            oxygen saturations were monitored continuously. The                            [id number]) scope was introduced through                            the mouth and advanced to the proximal jejunum. The                            small bowel enteroscopy was accomplished without                            difficulty.  The patient tolerated the procedure                            well. Scope In: 11:40:00 AM Scope Out: 12:00:45 PM Total Procedure Duration: 0 hours 20 minutes 45 seconds  Findings:      A small hiatal hernia was present.      Two medium angioectasias with no bleeding were found in the gastric       body. Coagulation for bleeding prevention using  argon plasma was       successful.      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       Coagulation for bleeding prevention using argon plasma was successful.      Patchy mild inflammation characterized by erythema was found in the       gastric body and in the gastric antrum. Biopsies were taken with a cold       forceps for Helicobacter pylori testing. Impression:               - Small hiatal hernia.                           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                            Treated with argon plasma coagulation (APC).                           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                            Treated with argon plasma coagulation (APC).                           - Gastritis. Biopsied. Moderate Sedation:      Per Anesthesia Care Recommendation:           - Patient has a contact number available for                            emergencies. The signs and symptoms of potential                            delayed complications were discussed with the                            patient. Return to normal activities tomorrow.                            Written discharge instructions were provided to the                            patient.                           -  Resume previous diet.                           - Continue present medications.                           - Continue oral iron                           - Follow up in 3 months Procedure Code(s):        --- Professional ---                           [id number], 59, Small intestinal endoscopy, enteroscopy                            beyond second portion of duodenum, not including                            ileum; with control of bleeding (eg, injection,                            bipolar cautery, unipolar  cautery, laser, heater                            probe, stapler, plasma coagulator)                           44361, Small intestinal endoscopy, enteroscopy                            beyond second portion of duodenum, not including                            ileum; with biopsy, single or multiple Diagnosis Code(s):        --- Professional ---                           K44.9, Diaphragmatic hernia without obstruction or                            gangrene                           K31.819, Angiodysplasia of stomach and duodenum                            without bleeding                           K29.70, Gastritis, unspecified, without bleeding                           D50.0, Iron deficiency anemia secondary to blood                            loss (chronic)  B96.81, Helicobacter pylori [H. pylori] as the                            cause of diseases classified elsewhere CPT copyright 2022 American Medical Association. All rights reserved. The codes documented in this report are preliminary and upon coder review may  be revised to meet current compliance requirements. Hennie Duos. Marletta Lor, DO Hennie Duos. Marletta Lor, DO 03/25/2023 12:06:48 PM This report has been signed electronically. Number of Addenda: 0

## 2023-03-25 NOTE — Anesthesia Procedure Notes (Signed)
Date/Time: 03/25/2023 11:34 AM  Performed by: Franco Nones, CRNAPre-anesthesia Checklist: Patient identified, Emergency Drugs available, Suction available, Timeout performed and Patient being monitored Patient Re-evaluated:Patient Re-evaluated prior to induction Oxygen Delivery Method: Nasal Cannula

## 2023-03-25 NOTE — Anesthesia Preprocedure Evaluation (Signed)
Anesthesia Evaluation  Patient identified by MRN, date of birth, ID band Patient awake    Reviewed: Allergy & Precautions, H&P , NPO status , Patient's Chart, lab work & pertinent test results, reviewed documented beta blocker date and time   Airway Mallampati: II  TM Distance: >3 FB Neck ROM: full    Dental no notable dental hx.    Pulmonary neg pulmonary ROS, COPD, former smoker   Pulmonary exam normal breath sounds clear to auscultation       Cardiovascular Exercise Tolerance: Good hypertension, + CAD and + Peripheral Vascular Disease  negative cardio ROS  Rhythm:regular Rate:Normal     Neuro/Psych  PSYCHIATRIC DISORDERS Anxiety     negative neurological ROS  negative psych ROS   GI/Hepatic negative GI ROS, Neg liver ROS,,,  Endo/Other  negative endocrine ROS    Renal/GU negative Renal ROS  negative genitourinary   Musculoskeletal   Abdominal   Peds  Hematology negative hematology ROS (+) Blood dyscrasia, anemia   Anesthesia Other Findings   Reproductive/Obstetrics negative OB ROS                             Anesthesia Physical Anesthesia Plan  ASA: 3  Anesthesia Plan: General   Post-op Pain Management:    Induction:   PONV Risk Score and Plan: Propofol infusion  Airway Management Planned:   Additional Equipment:   Intra-op Plan:   Post-operative Plan:   Informed Consent: I have reviewed the patients History and Physical, chart, labs and discussed the procedure including the risks, benefits and alternatives for the proposed anesthesia with the patient or authorized representative who has indicated his/her understanding and acceptance.     Dental Advisory Given  Plan Discussed with: CRNA  Anesthesia Plan Comments:        Anesthesia Quick Evaluation

## 2023-03-25 NOTE — H&P (Signed)
Primary Care Physician:  Assunta Found, MD Primary Gastroenterologist:  Dr. Marletta Lor  Pre-Procedure History & Physical: HPI:  Desiree Barber is a 72 y.o. female is here for an EGD to be performed for iron deficiency anemia, abnormal small bowel capsule endoscopy, history of H. pylori.  Past Medical History:  Diagnosis Date   Anemia    Anxiety    Occasional   Arthritis    COPD (chronic obstructive pulmonary disease) (HCC)    HTN (hypertension)    Iron deficiency anemia due to chronic blood loss 03/28/2022   Peripheral arterial disease [facility])     Past Surgical History:  Procedure Laterality Date   ABDOMINAL AORTOGRAM W/LOWER EXTREMITY N/A 04/10/2022   Procedure: ABDOMINAL AORTOGRAM W/LOWER EXTREMITY;  Surgeon: Nada Libman, MD;  [facility];  Service: Cardiovascular;  Laterality: N/A;   BIOPSY  08/27/2022   Procedure: BIOPSY;  Surgeon: Lanelle Bal, DO;  [facility];  Service: Endoscopy;;   COLONOSCOPY  02/23/04   RMR: normal rectum and colon.minimal internal hemorrhoids   COLONOSCOPY WITH PROPOFOL N/A 06/13/2015   Procedure: COLONOSCOPY WITH PROPOFOL;  Surgeon: Corbin Ade, MD;  [facility];  Service: Endoscopy;  Laterality: N/A;  1045   COLONOSCOPY WITH PROPOFOL N/A 08/27/2022   Procedure: COLONOSCOPY WITH PROPOFOL;  Surgeon: Lanelle Bal, DO;  [facility];  Service: Endoscopy;  Laterality: N/A;  8:45 am, asa 3   ESOPHAGOGASTRODUODENOSCOPY (EGD) WITH PROPOFOL N/A 08/27/2022   Procedure: ESOPHAGOGASTRODUODENOSCOPY (EGD) WITH PROPOFOL;  Surgeon: Lanelle Bal, DO;  [facility];  Service: Endoscopy;  Laterality: N/A;   GIVENS CAPSULE STUDY N/A 01/29/2023   Procedure: GIVENS CAPSULE STUDY;  Surgeon: Lanelle Bal, DO;  [facility];  Service: Endoscopy;  Laterality: N/A;  730am   HOT HEMOSTASIS  08/27/2022   Procedure: HOT HEMOSTASIS (ARGON PLASMA COAGULATION/BICAP);  Surgeon: Lanelle Bal,  DO;  [facility];  Service: Endoscopy;;   POLYPECTOMY  06/13/2015   Procedure: POLYPECTOMY;  Surgeon: Corbin Ade, MD;  [facility];  Service: Endoscopy;;  sigmoid colon polyp   TOOTH EXTRACTION     TRIGGER FINGER RELEASE Bilateral    TUBAL LIGATION      Prior to Admission medications   Medication Sig Start Date End Date Taking? Authorizing Provider  albuterol (PROVENTIL) (2.5 MG/3ML) 0.083% nebulizer solution Up to every 4 hours as needed 07/10/22  Yes Nyoka Cowden, MD  albuterol (VENTOLIN HFA) 108 (90 Base) MCG/ACT inhaler Inhale into the lungs. 02/20/22  Yes [provider]  Ascorbic Acid (VITAMIN C) 100 MG tablet Take 100 mg by mouth daily.   Yes [provider]  ferrous sulfate 324 (65 Fe) MG TBEC Take 1 tablet (325 mg total) by mouth daily with breakfast. 02/13/23 02/13/24 Yes Gari Hartsell K, DO  OVER THE COUNTER MEDICATION Otc iron one daily   Yes [provider]  pantoprazole (PROTONIX) 40 MG tablet Take 1 tablet (40 mg total) by mouth 2 (two) times daily. 09/06/22 09/06/23 Yes Pho[place]ia Pirie, Hennie Duos, DO  vitamin B-12 (CYANOCOBALAMIN) 100 MCG tablet Take 100 mcg by mouth daily.   Yes [provider]  VITAMIN D, CHOLECALCIFEROL, PO Take 1 tablet by mouth daily. One daily   Yes [provider]  ALPRAZolam (XANAX) 0.25 MG tablet Take 0.25 mg by mouth daily as needed for anxiety or sleep. 06/12/21   [provider]  clopidogrel (PLAVIX) 75 MG  tablet Take 1 tablet (75 mg total) by mouth daily. 04/10/22   Nada Libman, MD  umeclidinium-vilanterol (ANORO ELLIPTA) 62.5-25 MCG/ACT AEPB Inhale 1 puff into the lungs daily. 12/13/22   Nyoka Cowden, MD    Allergies as of 02/13/2023 - Review Complete 02/13/2023  Allergen Reaction Noted   Demerol [meperidine] Nausea And Vomiting 05/03/2015   Oxycontin [oxycodone hcl] Nausea And Vomiting 05/03/2015   Fish allergy Nausea Only 06/13/2015   Lavender oil Itching  02/28/2022   Other Itching 02/28/2022    Family History  Problem Relation Age of Onset   Colon cancer Neg Hx    Breast cancer Neg Hx     Social History   Socioeconomic History   Marital status: Married    Spouse name: Not on file   Number of children: Not on file   Years of education: Not on file   Highest education level: Not on file  Occupational History   Not on file  Tobacco Use   Smoking status: Former    Current packs/day: 0.00    Average packs/day: 0.5 packs/day for 55.0 years (27.5 ttl pk-yrs)    Types: Cigarettes    Start date: 05/30/1966    Quit date: 05/29/2021    Years since quitting: 1.8    Passive exposure: Past   Smokeless tobacco: Former   Tobacco comments:    Quit date 05/29/2021  Vaping Use   Vaping status: Never Used  Substance and Sexual Activity   Alcohol use: Yes    Alcohol/week: 0.0 standard drinks of alcohol    Comment: 3 beers a day   Drug use: No   Sexual activity: Yes    Birth control/protection: Surgical  Other Topics Concern   Not on file  Social History Narrative   Not on file   Social Drivers of Health   Financial Resource Strain: Not on file  Food Insecurity: Unknown (12/24/2021)   Hunger Vital Sign    Worried About Running Out of Food in the Last Year: Never true    Ran Out of Food in the Last Year: Not on file  Transportation Needs: No Transportation Needs (12/24/2021)   PRAPARE - Administrator, Civil Service (Medical): No    Lack of Transportation (Non-Medical): No  Physical Activity: Not on file  Stress: Not on file  Social Connections: Unknown (08/07/2021)   Received from [facility], [facility]   Social Network    Social Network: Not on file  Intimate Partner Violence: Not At Risk (12/24/2021)   Humiliation, Afraid, Rape, and Kick questionnaire    Fear of Current or Ex-Partner: No    Emotionally Abused: No    Physically Abused: No    Sexually Abused: No    Review of Systems: General: Negative for  fever, chills, fatigue, weakness. Eyes: Negative for vision changes.  ENT: Negative for hoarseness, difficulty swallowing , nasal congestion. CV: Negative for chest pain, angina, palpitations, dyspnea on exertion, peripheral edema.  Respiratory: Negative for dyspnea at rest, dyspnea on exertion, cough, sputum, wheezing.  GI: See history of present illness. GU:  Negative for dysuria, hematuria, urinary incontinence, urinary frequency, nocturnal urination.  MS: Negative for joint pain, low back pain.  Derm: Negative for rash or itching.  Neuro: Negative for weakness, abnormal sensation, seizure, frequent headaches, memory loss, confusion.  Psych: Negative for anxiety, depression Endo: Negative for unusual weight change.  Heme: Negative for bruising or bleeding. Allergy: Negative for rash or hives.  Physical Exam:  Vital signs in last 24 hours: Temp:  [97.7 F (36.5 C)] 97.7 F (36.5 C) (12/30 1100) Pulse Rate:  [80] 80 (12/30 1100) Resp:  [20] 20 (12/30 1100) BP: (138)/(67) 138/67 (12/30 1100) SpO2:  [99 %] 99 % (12/30 1100)   General:   Alert,  Well-developed, well-nourished, pleasant and cooperative in NAD Head:  Normocephalic and atraumatic. Eyes:  Sclera clear, no icterus.   Conjunctiva pink. Ears:  Normal auditory acuity. Nose:  No deformity, discharge,  or lesions. Msk:  Symmetrical without gross deformities. Normal posture. Extremities:  Without clubbing or edema. Neurologic:  Alert and  oriented x4;  grossly normal neurologically. Skin:  Intact without significant lesions or rashes. Psych:  Alert and cooperative. Normal mood and affect.   Impression/Plan: Desiree Barber is here for an EGD to be performed for iron deficiency anemia, abnormal small bowel capsule endoscopy, history of H. pylori.  Risks, benefits, limitations, imponderables and alternatives regarding procedure have been reviewed with the patient. Questions have been answered. All parties agreeable.

## 2023-03-25 NOTE — Discharge Instructions (Addendum)
EGD/small bowel enteroscopy Discharge instructions Please read the instructions outlined below and refer to this sheet in the next few weeks. These discharge instructions provide you with general information on caring for yourself after you leave the hospital. Your doctor may also give you specific instructions. While your treatment has been planned according to the most current medical practices available, unavoidable complications occasionally occur. If you have any problems or questions after discharge, please call your doctor. ACTIVITY You may resume your regular activity but move at a slower pace for the next 24 hours.  Take frequent rest periods for the next 24 hours.  Walking will help expel (get rid of) the air and reduce the bloated feeling in your abdomen.  No driving for 24 hours (because of the anesthesia (medicine) used during the test).  You may shower.  Do not sign any important legal documents or operate any machinery for 24 hours (because of the anesthesia used during the test).  NUTRITION Drink plenty of fluids.  You may resume your normal diet.  Begin with a light meal and progress to your normal diet.  Avoid alcoholic beverages for 24 hours or as instructed by your caregiver.  MEDICATIONS You may resume your normal medications unless your caregiver tells you otherwise.  WHAT YOU CAN EXPECT TODAY You may experience abdominal discomfort such as a feeling of fullness or "gas" pains.  FOLLOW-UP Your doctor will discuss the results of your test with you.  SEEK IMMEDIATE MEDICAL ATTENTION IF ANY OF THE FOLLOWING OCCUR: Excessive nausea (feeling sick to your stomach) and/or vomiting.  Severe abdominal pain and distention (swelling).  Trouble swallowing.  Temperature over 101 F (37.8 C).  Rectal bleeding or vomiting of blood.   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  I treated all of these with coagulation today.  Continue on oral iron.  I  also took samples of your stomach given your history of H. pylori prior.  We will call with these results.  Follow-up with me in 3 months.   I hope you have a great rest of your week!  Hennie Duos. Marletta Lor, D.O. Gastroenterology and Hepatology [facility]

## 2023-03-26 LAB — SURGICAL PATHOLOGY

## 2023-03-26 NOTE — Anesthesia Postprocedure Evaluation (Signed)
 Anesthesia Post Note  Patient: Desiree Barber  Procedure(s) Performed: ENTEROSCOPY HOT HEMOSTASIS (ARGON PLASMA COAGULATION/BICAP) BIOPSY  Patient location during evaluation: Phase II Anesthesia Type: General Level of consciousness: awake Pain management: pain level controlled Vital Signs Assessment: post-procedure vital signs reviewed and stable Respiratory status: spontaneous breathing and respiratory function stable Cardiovascular status: blood pressure returned to baseline and stable Postop Assessment: no headache and no apparent nausea or vomiting Anesthetic complications: no Comments: Late entry   No notable events documented.   Last Vitals:  Vitals:   03/25/23 1100 03/25/23 1209  BP: 138/67 (!) 141/79  Pulse: 80 93  Resp: 20 (!) 21  Temp: 36.5 C 36.6 C  SpO2: 99% 94%    Last Pain:  Vitals:   03/25/23 1209  TempSrc: Oral  PainSc: 0-No pain                 Yvonna JINNY Bosworth

## 2023-04-04 ENCOUNTER — Encounter: Payer: Self-pay | Admitting: Orthopaedic Surgery

## 2023-04-04 ENCOUNTER — Ambulatory Visit (INDEPENDENT_AMBULATORY_CARE_PROVIDER_SITE_OTHER): Payer: Medicare Other | Admitting: Orthopaedic Surgery

## 2023-04-04 ENCOUNTER — Other Ambulatory Visit (INDEPENDENT_AMBULATORY_CARE_PROVIDER_SITE_OTHER): Payer: Self-pay

## 2023-04-04 DIAGNOSIS — S62521D Displaced fracture of distal phalanx of right thumb, subsequent encounter for fracture with routine healing: Secondary | ICD-10-CM

## 2023-04-04 DIAGNOSIS — S62521A Displaced fracture of distal phalanx of right thumb, initial encounter for closed fracture: Secondary | ICD-10-CM | POA: Diagnosis not present

## 2023-04-04 NOTE — Progress Notes (Signed)
 My thumb is better  She has only minimal pain of the right thumb.  Her nail has not come off yet but the new nail is growing in well.  NV intact.  X-rays were done, reported separately, of the right thumb.  Encounter Diagnosis  Name Primary?   Closed displaced fracture of distal phalanx of right thumb with routine healing, subsequent encounter Yes   Return in one month. She may call and cancel if doing well.  Call if any problem.  Precautions discussed.  Electronically Signed Lemond Stable, MD 1/9/20259:23 AM

## 2023-04-05 ENCOUNTER — Encounter (HOSPITAL_COMMUNITY): Payer: Self-pay | Admitting: Internal Medicine

## 2023-04-08 ENCOUNTER — Ambulatory Visit (HOSPITAL_COMMUNITY)
Admission: RE | Admit: 2023-04-08 | Discharge: 2023-04-08 | Disposition: A | Discharge status: Home / Self Care | Payer: Medicare Other | Source: Ambulatory Visit | Attending: Acute Care | Admitting: Acute Care

## 2023-04-08 DIAGNOSIS — F17210 Nicotine dependence, cigarettes, uncomplicated: Secondary | ICD-10-CM | POA: Diagnosis not present

## 2023-04-08 DIAGNOSIS — Z122 Encounter for screening for malignant neoplasm of respiratory organs: Secondary | ICD-10-CM

## 2023-04-08 DIAGNOSIS — Z87891 Personal history of nicotine dependence: Secondary | ICD-10-CM

## 2023-04-09 ENCOUNTER — Ambulatory Visit (HOSPITAL_COMMUNITY): Payer: Medicare Other

## 2023-04-17 ENCOUNTER — Telehealth: Payer: Self-pay

## 2023-04-17 NOTE — Telephone Encounter (Signed)
Call Report from [place]  IMPRESSION: 1. Lung-RADS 4A, suspicious. Multiple new right upper lobe pulmonary nodules are identified, likely infectious/inflammatory given the background chronic circumferential bronchial wall thickening and diffuse scattered tree-in-bud disease involving all lobes of both lungs. Imaging features may well reflect chronic atypical infection. Given the new 7.5 mm posterior right upper lobe pulmonary nodule, today's study meets size criteria for Lung-RADS 4A. Follow up low-dose chest CT without contrast in 3 months (please use the following order, "CT CHEST LCS NODULE FOLLOW-UP W/O CM") is recommended. Alternatively, PET may be considered when there is a solid component 8mm or larger. 2. Cholelithiasis. Gallbladder wall is ill-defined where visualized, but this may be related to motion artifact. Right upper quadrant ultrasound could be used to further evaluate as clinically warranted. 3.  Aortic Atherosclerosis (ICD10-I70.0).

## 2023-04-24 NOTE — Telephone Encounter (Signed)
Spoke with Kandice Robinsons NP - because of the new 7.4mm nodule she would like [place] to call the patient check if she has been sick, if so she will need to reach out to her PCP for treatment. Will need to check for weight loss and hemoptysis. Radiology feels the nodule could be due to infectious/inflammatory processes. Regardless, patient will need 3 month follow up - due 07/08/2023.

## 2023-04-25 ENCOUNTER — Other Ambulatory Visit: Payer: Self-pay

## 2023-04-25 DIAGNOSIS — R911 Solitary pulmonary nodule: Secondary | ICD-10-CM

## 2023-04-25 DIAGNOSIS — F17210 Nicotine dependence, cigarettes, uncomplicated: Secondary | ICD-10-CM

## 2023-04-25 DIAGNOSIS — Z87891 Personal history of nicotine dependence: Secondary | ICD-10-CM

## 2023-04-25 DIAGNOSIS — Z122 Encounter for screening for malignant neoplasm of respiratory organs: Secondary | ICD-10-CM

## 2023-04-25 NOTE — Telephone Encounter (Signed)
Spoke with patient. Reviewed CT results. Advised because of the new 7.43mm nodule she will need a repeat CT in 3 months. Patient advises she has not been sick, experienced weight loss or hemoptysis. Pt is in a agreement for 3 month f/u scan. Plan and results sent to PCP. 3 month f/u CT order placed.

## 2023-04-29 ENCOUNTER — Other Ambulatory Visit (HOSPITAL_COMMUNITY): Payer: Self-pay | Admitting: Family Medicine

## 2023-04-29 DIAGNOSIS — Z1231 Encounter for screening mammogram for malignant neoplasm of breast: Secondary | ICD-10-CM

## 2023-04-30 ENCOUNTER — Telehealth: Payer: Self-pay | Admitting: *Deleted

## 2023-04-30 ENCOUNTER — Other Ambulatory Visit: Payer: Self-pay | Admitting: Surgery

## 2023-04-30 DIAGNOSIS — J449 Chronic obstructive pulmonary disease, unspecified: Secondary | ICD-10-CM | POA: Diagnosis not present

## 2023-04-30 DIAGNOSIS — M546 Pain in thoracic spine: Secondary | ICD-10-CM | POA: Diagnosis not present

## 2023-04-30 DIAGNOSIS — D509 Iron deficiency anemia, unspecified: Secondary | ICD-10-CM | POA: Diagnosis not present

## 2023-04-30 DIAGNOSIS — Z6822 Body mass index (BMI) 22.0-22.9, adult: Secondary | ICD-10-CM | POA: Diagnosis not present

## 2023-04-30 NOTE — Telephone Encounter (Signed)
 Patient was a walk in today saying Dr. Marvine told her he wants her to have another chest ct now because she is having some back pain Per Isaiah Dover, RN She is suppose to have a 3 month nodule follow up scan in April.order by Camie Lites, NP---Patient informed Dr. Marvine will have to order any additional testing for her back pain.  She voiced her understanding.

## 2023-05-02 ENCOUNTER — Encounter: Payer: Medicare Other | Admitting: Orthopaedic Surgery

## 2023-05-08 ENCOUNTER — Ambulatory Visit (HOSPITAL_COMMUNITY): Payer: Medicare Other

## 2023-05-26 NOTE — Progress Notes (Deleted)
 Desiree Barber, female    DOB: [birth year]    MRN: [id number]  Brief patient profile:  72  yowf  quit smoking 05/29/21/MM with doe/cough which improved p quit smoking  referred to pulmonary clinic in [place]  06/13/2021 by Desiree Herald PA for ? 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    History of Present Illness  06/13/2021  Pulmonary/ 1st office eval/ Desiree Barber / [facility]  Chief Complaint  Patient presents with   Consult    Consult for COPD with hypoxia and need for O2 form [facility]   Dyspnea:  12-15 min walks 1/2 mile some inclines s stopping on incruse x > year. 02 sats now ok p stops but not checking at peak ex  Cough: none  Sleep: well / on side bed is flat  SABA use: none   Rec Congratulations on not smoking -  it's the most important aspect of your care.  Ok to try off incruse to see if it affects your wind /breathing when walking and if worse consider restarting incruse or substituting anoro (the highest octane)  > did fine off incruse      07/10/2022  f/u ov/[place] office/Desiree Barber re: copd /bronchiectais maint on anoro   Chief Complaint  Patient presents with   Follow-up    Pt f/u for increased SOB - alternating between Anoro and Trelegy   Dyspnea:  pushing cart [employer] fine / not checking sats with ex  Cough: better now  Sleeping: flat bed/ one pillow  SABA use: not using  02: POC not using  Rec Plan A = Automatic = Always=    Anoro one click each am - tug initially  Plan B = Backup (to supplement plan A, not to replace it) Only use your albuterol inhaler as a rescue medication  Plan C = Crisis (instead of Plan B but only if Plan B stops working) - only use your albuterol nebulizer if you first try Plan B  Keep up with your vaccinations  My office will be contacting you by phone for referral to pulmonary rehab  - if you don't hear back from my office within one week please call us back or notify us thru MyChart and we'll address it right away.   Late add: Make sure you check your oxygen saturation  AT  your highest level of activity (not after you stop)  to  be sure it stays over 90%     12/13/2022  f/u ov/[place] office/Desiree Barber re: copd/ bronchiectasis  maint on Anoro   Chief Complaint  Patient presents with   Follow-up    6 month follow up   Dyspnea:  pulling weeds on knees due to abd fullness attributed to over eating ever since she quit smoking  Pulmonary Rehab tol New step and sats low 90s Cough: no Sleeping: flat bed/ one pillow s    resp cc  SABA use: not helpful hfa or neb  02: none - has POC using at rest    Rec    05/29/2023  f/u ov/[place] office/Desiree Barber re: *** maint on ***   MPNs by [facility]  No chief complaint on file.   Dyspnea:  *** Cough: *** Sleeping: ***   resp cc  SABA use: *** 02: ***  Lung cancer screening: ***   No obvious day to day or daytime variability or assoc excess/ purulent sputum or mucus plugs or hemoptysis or cp or chest tightness, subjective wheeze or overt sinus  or hb symptoms.    Also denies any obvious fluctuation of symptoms with weather or environmental changes or other aggravating or alleviating factors except as outlined above   No unusual exposure hx or h/o childhood pna/ asthma or knowledge of premature birth.  Current Allergies, Complete Past Medical History, Past Surgical History, Family History, and Social History were reviewed in [employer] record.  ROS  The following are not active complaints unless bolded Hoarseness, sore throat, dysphagia, dental problems, itching, sneezing,  nasal congestion or discharge of excess mucus or purulent secretions, ear ache,   fever, chills, sweats, unintended wt loss or wt gain, classically pleuritic or exertional cp,  orthopnea pnd or arm/hand swelling  or leg swelling, presyncope, palpitations, abdominal pain, anorexia, nausea, vomiting, diarrhea  or change in bowel habits or change in bladder habits, change in  stools or change in urine, dysuria, hematuria,  rash, arthralgias, visual complaints, headache, numbness, weakness or ataxia or problems with walking or coordination,  change in mood or  memory.        No outpatient medications have been marked as taking for the 05/29/23 encounter (Appointment) with Desiree Cowden, MD.                   Past Medical History:  Diagnosis Date   Anxiety    Occasional   Arthritis    COPD (chronic obstructive pulmonary disease) (HCC)    HTN (hypertension)        Objective:    Wts  05/29/2023          ***  12/13/2022       131  07/10/2022       125  02/14/2022     117   01/05/22 119 lb 6.4 oz (54.2 kg)  01/05/22 119 lb 12.8 oz (54.3 kg)  12/24/21 115 lb 3.2 oz (52.3 kg)    Vital signs reviewed  05/29/2023  - Note at rest 02 sats  ***% on ***   General appearance:    ***    Mild barrel    somewhat tensely obese  - Mild clubbing ***       I personally reviewed images and agree with radiology impression as follows:   Chest [facility]       Jan 13/2025  1. Lung-RADS 4A, suspicious. Multiple new right upper lobe pulmonary nodules are identified, likely infectious/inflammatory given the background chronic circumferential bronchial wall thickening and diffuse scattered tree-in-bud disease involving all lobes of both lungs. Imaging features may well reflect chronic atypical infection. Given the new 7.5 mm posterior right upper lobe pulmonary nodule, today's study meets size criteria for Lung-RADS 4A. Follow up low-dose chest CT without contrast in 3 months   2. Cholelithiasis. Gallbladder wall is ill-defined where visualized, but this may be related to motion artifact. Right upper quadrant ultrasound could be used to further evaluate as clinically warranted. 3.  Aortic Atherosclerosis (ICD10-I70.0).    Assessment

## 2023-05-28 ENCOUNTER — Telehealth: Payer: Self-pay | Admitting: Internal Medicine

## 2023-05-28 NOTE — Telephone Encounter (Signed)
 Spoke with patient regarding new appointment date and time for the cancelled 05/29/23 appointment with Dr. Wert---06/27/23 at 2:30 pm---patient voiced her understanding

## 2023-05-29 ENCOUNTER — Ambulatory Visit: Payer: Medicare Other | Admitting: Internal Medicine

## 2023-06-03 ENCOUNTER — Encounter: Payer: Self-pay | Admitting: Internal Medicine

## 2023-06-23 NOTE — Progress Notes (Signed)
 Desiree Barber, female    DOB: [birth year]    MRN: [id number]  Brief patient profile:  72  yowf  quit smoking 05/29/21/MM with doe/cough which improved p quit smoking  referred to pulmonary clinic in [place]  06/13/2021 by Lenise Herald PA for ? 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    History of Present Illness  06/13/2021  Pulmonary/ 1st office eval/ Desiree Barber / [place] [facility]  Chief Complaint  Patient presents with   Consult    Consult for COPD with hypoxia and need for O2 form [facility]   Dyspnea:  12-15 min walks 1/2 mile some inclines s stopping on incruse x > year. 02 sats now ok p stops but not checking at peak ex  Cough: none  Sleep: well / on side bed is flat  SABA use: none   Rec Congratulations on not smoking -  it's the most important aspect of your care.  Ok to try off incruse to see if it affects your wind /breathing when walking and if worse consider restarting incruse or substituting anoro (the highest octane)  > did fine off incruse      07/10/2022  f/u ov/[place] office/Desiree Barber re: copd /bronchiectais maint on anoro   Chief Complaint  Patient presents with   Follow-up    Pt f/u for increased SOB - alternating between Anoro and Trelegy   Dyspnea:  pushing cart [employer] fine / not checking sats with ex  Cough: better now  Sleeping: flat bed/ one pillow  SABA use: not using  02: POC not using  Rec Plan A = Automatic = Always=    Anoro one click each am - tug initially  Plan B = Backup (to supplement plan A, not to replace it) Only use your albuterol inhaler as a rescue medication  Plan C = Crisis (instead of Plan B but only if Plan B stops working) - only use your albuterol nebulizer if you first try Plan B  Keep up with your vaccinations  My office will be contacting you by phone for referral to pulmonary rehab  - if you don't hear back from my office within one week please call us back or notify us thru MyChart and we'll address it right away.   Late add: Make sure you check your oxygen saturation  AT  your highest level of activity (not after you stop)  to  be sure it stays over 90%     12/13/2022  f/u ov/[place] office/Desiree Barber re: copd/ bronchiectasis  maint on Anoro   Chief Complaint  Patient presents with   Follow-up    6 month follow up   Dyspnea:  pulling weeds on knees due to abd fullness attributed to over eating ever since she quit smoking  Pulmonary Rehab tol New step and sats low 90s Cough: no Sleeping: flat bed/ one pillow s resp cc  SABA use: not helpful hfa or neb  02: none - has POC using at rest  Rec Plan A = Automatic = Always=    Anoro one click each am  - take two good strong drags Plan B = Backup (to supplement plan A, not to replace it) Only use your albuterol inhaler as a rescue medication Plan C = Crisis (instead of Plan B but only if Plan B stops working) - only use your albuterol nebulizer if you first try Plan B  Also  Ok to try albuterol 15 min before an activity (on alternating  days)  that you know would usually make you short of breath Please schedule a follow up visit in 6  months but call sooner if needed    06/27/2023  f/u ov/ office/Desiree Barber re: COPD / bronchiectasis  maint on Anoro   MPNs by [facility]  Cc c sob worse since first of year  Dyspnea:  still doing some gardening / housework and cooking  Cough: none  Sleeping: flat bed one pillow s    resp cc  SABA use: twice daily / one neb  02: has POC  not using much as doesn't help doe when she does     No obvious day to day or daytime variability or assoc excess/ purulent sputum or mucus plugs or hemoptysis or cp or chest tightness, subjective wheeze or overt   hb symptoms.    Also denies any obvious fluctuation of symptoms with weather or environmental changes or other aggravating or alleviating factors except as outlined above   No unusual exposure hx or h/o childhood pna/ asthma or knowledge of premature birth.  Current Allergies,  Complete Past Medical History, Past Surgical History, Family History, and Social History were reviewed in [employer] record.  ROS  The following are not active complaints unless bolded Hoarseness, sore throat, dysphagia, dental problems, itching, sneezing,  nasal congestion or discharge of excess mucus or purulent secretions, ear ache,   fever, chills, sweats, unintended wt loss or wt gain, classically pleuritic or exertional cp,  orthopnea pnd or arm/hand swelling  or leg swelling, presyncope, palpitations, abdominal pain, anorexia, nausea, vomiting, diarrhea  or change in bowel habits or change in bladder habits, change in stools or change in urine, dysuria, hematuria,  rash, arthralgias, visual complaints, headache, numbness, weakness or ataxia or problems with walking or coordination,  change in mood or  memory.        Current Meds  Medication Sig   albuterol (PROVENTIL) (2.5 MG/3ML) 0.083% nebulizer solution Up to every 4 hours as needed   albuterol (VENTOLIN HFA) 108 (90 Base) MCG/ACT inhaler Inhale into the lungs.   ALPRAZolam (XANAX) 0.25 MG tablet Take 0.25 mg by mouth daily as needed for anxiety or sleep.   Ascorbic Acid (VITAMIN C) 100 MG tablet Take 100 mg by mouth daily.   clopidogrel (PLAVIX) 75 MG tablet TAKE (1) TABLET BY MOUTH ONCE DAILY.   ferrous sulfate 324 (65 Fe) MG TBEC Take 1 tablet (325 mg total) by mouth daily with breakfast. (Patient taking differently: Take 1 tablet by mouth 2 (two) times daily.)   OVER THE COUNTER MEDICATION Otc iron one daily   pantoprazole (PROTONIX) 40 MG tablet Take 1 tablet (40 mg total) by mouth 2 (two) times daily.   umeclidinium-vilanterol (ANORO ELLIPTA) 62.5-25 MCG/ACT AEPB Inhale 1 puff into the lungs daily.   vitamin B-12 (CYANOCOBALAMIN) 100 MCG tablet Take 100 mcg by mouth daily.   VITAMIN D, CHOLECALCIFEROL, PO Take 1 tablet by mouth daily. One daily                   Past Medical History:  Diagnosis  Date   Anxiety    Occasional   Arthritis    COPD (chronic obstructive pulmonary disease) (HCC)    HTN (hypertension)        Objective:    Wts  06/27/2023         131  12/13/2022       131  07/10/2022       125  02/14/2022     117   01/05/22 119 lb 6.4 oz (54.2 kg)  01/05/22 119 lb 12.8 oz (54.3 kg)  12/24/21 115 lb 3.2 oz (52.3 kg)    Vital signs reviewed  06/27/2023  - Note at rest 02 sats  90% on RA   General appearance:    eldelry amb wf nad   HEENT : Oropharynx  clear   Nasal turbinates nl    NECK :  without  apparent JVD/ palpable Nodes/TM    LUNGS: no acc muscle use,  Mild barrel  contour chest wall with bilateral  Distant bs s audible wheeze and  without cough on insp or exp maneuvers  and mild  Hyperresonant  to  percussion bilaterally     CV:  RRR  no s3 or murmur or increase in P2, and no edema   ABD:  soft and nontender with pos end  insp Hoover's  in the supine position.  No bruits or organomegaly appreciated   MS:  Nl gait/ ext warm without deformities Or obvious joint restrictions  calf tenderness, cyanosis -  Mild clubbing     SKIN: warm and dry without lesions    NEURO:  alert, approp, nl sensorium with  no motor or cerebellar deficits apparent.    Labs ordered/ reviewed:      Chemistry      Component Value Date/Time   NA 140 06/27/2023 1121   K 4.7 06/27/2023 1121   CL 100 06/27/2023 1121   CO2 27 06/27/2023 1121   BUN 8 06/27/2023 1121   CREATININE 0.66 06/27/2023 1121      Component Value Date/Time   CALCIUM 9.3 06/27/2023 1121   ALKPHOS 82 08/28/2022 0908   AST 19 08/28/2022 0908   ALT 14 08/28/2022 0908   BILITOT 0.5 08/28/2022 0908        Lab Results  Component Value Date   WBC 7.4 06/27/2023   HGB 14.2 06/27/2023   HCT 43.0 06/27/2023   MCV 105 (H) 06/27/2023   PLT 336 06/27/2023     No results found for: "DDIMER"    Lab Results  Component Value Date   TSH 1.400 06/27/2023       BNP    06/27/2023     34     I  personally reviewed images and agree with radiology impression as follows:   Chest [facility]       Jan 13/2025  1. Lung-RADS 4A, suspicious. Multiple new right upper lobe pulmonary nodules are identified, likely infectious/inflammatory given the background chronic circumferential bronchial wall thickening and diffuse scattered tree-in-bud disease involving all lobes of both lungs. Imaging features may well reflect chronic atypical infection. Given the new 7.5 mm posterior right upper lobe pulmonary nodule, today's study meets size criteria for Lung-RADS 4A. Follow up low-dose chest CT without contrast in 3 months   2. Cholelithiasis. Gallbladder wall is ill-defined where visualized, but this may be related to motion artifact. Right upper quadrant ultrasound could be used to further evaluate as clinically warranted. 3.  Aortic Atherosclerosis (ICD10-I70.0).    Assessment

## 2023-06-26 ENCOUNTER — Encounter: Payer: Self-pay | Admitting: Hematology

## 2023-06-27 ENCOUNTER — Ambulatory Visit (INDEPENDENT_AMBULATORY_CARE_PROVIDER_SITE_OTHER): Admitting: Internal Medicine

## 2023-06-27 ENCOUNTER — Encounter: Payer: Self-pay | Admitting: Internal Medicine

## 2023-06-27 VITALS — BP 148/63 | HR 87 | Ht 64.0 in | Wt 131.0 lb

## 2023-06-27 DIAGNOSIS — N951 Menopausal and female climacteric states: Secondary | ICD-10-CM | POA: Insufficient documentation

## 2023-06-27 DIAGNOSIS — J449 Chronic obstructive pulmonary disease, unspecified: Secondary | ICD-10-CM | POA: Diagnosis not present

## 2023-06-27 DIAGNOSIS — J479 Bronchiectasis, uncomplicated: Secondary | ICD-10-CM | POA: Diagnosis not present

## 2023-06-27 DIAGNOSIS — R0609 Other forms of dyspnea: Secondary | ICD-10-CM | POA: Insufficient documentation

## 2023-06-27 DIAGNOSIS — J9611 Chronic respiratory failure with hypoxia: Secondary | ICD-10-CM

## 2023-06-27 DIAGNOSIS — M19211 Secondary osteoarthritis, right shoulder: Secondary | ICD-10-CM | POA: Insufficient documentation

## 2023-06-27 NOTE — Assessment & Plan Note (Addendum)
 Worse since xmas 2024  - doe labs ok 06/27/2023   No obvious findings - focus on rx of copd  and make sure she has adequate 02 with ex :  Advised: Make sure you check your oxygen saturation  AT  your highest level of activity (not after you stop)   to be sure it stays over 90% and adjust  02 flow upward to maintain this level if needed but remember to turn it back to previous settings when you stop (to conserve your supply).

## 2023-06-27 NOTE — Patient Instructions (Addendum)
 Also  Ok to try albuterol 15 min before an activity (on alternating days)  that you know would usually make you short of breath and see if it makes any difference and if makes none then don't take albuterol after activity unless you can't catch your breath as this means it's the resting that helps, not the albuterol.  Make sure you check your oxygen saturation  AT  your highest level of activity (not after you stop)   to be sure it stays over 90% and adjust  02 flow upward to maintain this level if needed but remember to turn it back to previous settings when you stop (to conserve your supply).       Please remember to go to the lab department   for your tests - we will call you with the results when they are available.  Please schedule a follow up visit in 3 months but call sooner if needed - PFTs on return

## 2023-06-28 LAB — CBC WITH DIFFERENTIAL/PLATELET
Basophils Absolute: 0.1 10*3/uL (ref 0.0–0.2)
Basos: 1 %
EOS (ABSOLUTE): 0.3 10*3/uL (ref 0.0–0.4)
Eos: 5 %
Hematocrit: 43 % (ref 34.0–46.6)
Hemoglobin: 14.2 g/dL (ref 11.1–15.9)
Immature Grans (Abs): 0 10*3/uL (ref 0.0–0.1)
Immature Granulocytes: 1 %
Lymphocytes Absolute: 1 10*3/uL (ref 0.7–3.1)
Lymphs: 13 %
MCH: 34.7 pg — ABNORMAL HIGH (ref 26.6–33.0)
MCHC: 33 g/dL (ref 31.5–35.7)
MCV: 105 fL — ABNORMAL HIGH (ref 79–97)
Monocytes Absolute: 0.7 10*3/uL (ref 0.1–0.9)
Monocytes: 9 %
Neutrophils Absolute: 5.3 10*3/uL (ref 1.4–7.0)
Neutrophils: 71 %
Platelets: 336 10*3/uL (ref 150–450)
RBC: 4.09 x10E6/uL (ref 3.77–5.28)
RDW: 12.4 % (ref 11.7–15.4)
WBC: 7.4 10*3/uL (ref 3.4–10.8)

## 2023-06-28 LAB — BASIC METABOLIC PANEL WITH GFR
BUN/Creatinine Ratio: 12 (ref 12–28)
BUN: 8 mg/dL (ref 8–27)
CO2: 27 mmol/L (ref 20–29)
Calcium: 9.3 mg/dL (ref 8.7–10.3)
Chloride: 100 mmol/L (ref 96–106)
Creatinine, Ser: 0.66 mg/dL (ref 0.57–1.00)
Glucose: 78 mg/dL (ref 70–99)
Potassium: 4.7 mmol/L (ref 3.5–5.2)
Sodium: 140 mmol/L (ref 134–144)
eGFR: 93 mL/min/{1.73_m2} (ref >59)

## 2023-06-28 LAB — TSH: TSH: 1.4 u[IU]/mL (ref 0.450–4.500)

## 2023-06-28 LAB — BRAIN NATRIURETIC PEPTIDE: BNP: 34 pg/mL (ref 0.0–100.0)

## 2023-07-01 NOTE — Assessment & Plan Note (Addendum)
 [facility]  12/28/21  Bilateral bronchiectasis and scattered areas of mucous plugging. Moderate centrilobular emphysema. New consolidation of the lingula. - Flutter valve added 07/10/2022  - See CT Apr 08 2023 > f/u needed for 07/07/23 > ordered   Discussed in detail all the  indications, usual  risks and alternatives  relative to the benefits with patient who agrees to proceed with w/u as outlined.     F/u 3 m with pfts on return          Each maintenance medication was reviewed in detail including emphasizing most importantly the difference between maintenance and prns and under what circumstances the prns are to be triggered using an action plan format where appropriate.  Total time for H and P, chart review, counseling, reviewing dpi/ hfa/ 02/pulse ox device(s) and generating customized AVS unique to this office visit / same day charting = 26 min

## 2023-07-01 NOTE — Assessment & Plan Note (Signed)
 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  - referred to pulmonary rehab 07/10/2022 > tol well as  of 12/13/2022  - 12/13/2022 changed trelegy to anoro due to wt gain on trelegy   Pt is Group B in terms of symptom/risk and laba/lama therefore appropriate rx at this point >>>  continue anoro and approp saba  Re SABA :  I spent extra time with pt today reviewing appropriate use of albuterol for prn use on exertion with the following points: 1) saba is for relief of sob that does not improve by walking a slower pace or resting but rather if the pt does not improve after trying this first. 2) If the pt is convinced, as many are, that saba helps recover from activity faster then it's easy to tell if this is the case by re-challenging : ie stop, take the inhaler, then p 5 minutes try the exact same activity (intensity of workload) that just caused the symptoms and see if they are substantially diminished or not after saba 3) if there is an activity that reproducibly causes the symptoms, try the saba 15 min before the activity on alternate days   If in fact the saba really does help, then fine to continue to use it prn but advised may need to look closer at the maintenance regimen being used to achieve better control of airways disease with exertion.

## 2023-07-04 ENCOUNTER — Telehealth: Payer: Self-pay

## 2023-07-04 NOTE — Telephone Encounter (Signed)
 Patient's daughter in-law called in to report that the patient has had worsening shortness of breath. She has been going to pulmonary rehab. Pulmonary does not seem to think symptoms are related to her lungs. They advised that she contact cardiology for an appointment. Patient has been scheduled for an appointment with Dr. Diona Browner next week.

## 2023-07-06 ENCOUNTER — Emergency Department (HOSPITAL_COMMUNITY)

## 2023-07-06 ENCOUNTER — Other Ambulatory Visit: Payer: Self-pay

## 2023-07-06 ENCOUNTER — Inpatient Hospital Stay (HOSPITAL_COMMUNITY)
Admission: EM | Admit: 2023-07-06 | Discharge: 2023-07-14 | DRG: 871 | Disposition: A | Discharge status: Home Health | Attending: Internal Medicine | Admitting: Internal Medicine

## 2023-07-06 ENCOUNTER — Observation Stay (HOSPITAL_COMMUNITY)

## 2023-07-06 ENCOUNTER — Encounter (HOSPITAL_COMMUNITY): Payer: Self-pay

## 2023-07-06 DIAGNOSIS — J9811 Atelectasis: Secondary | ICD-10-CM | POA: Diagnosis not present

## 2023-07-06 DIAGNOSIS — R5381 Other malaise: Secondary | ICD-10-CM | POA: Diagnosis present

## 2023-07-06 DIAGNOSIS — R911 Solitary pulmonary nodule: Secondary | ICD-10-CM | POA: Diagnosis not present

## 2023-07-06 DIAGNOSIS — I700 Atherosclerosis of aorta: Secondary | ICD-10-CM | POA: Diagnosis not present

## 2023-07-06 DIAGNOSIS — J4489 Other specified chronic obstructive pulmonary disease: Secondary | ICD-10-CM | POA: Diagnosis not present

## 2023-07-06 DIAGNOSIS — J9621 Acute and chronic respiratory failure with hypoxia: Secondary | ICD-10-CM | POA: Diagnosis not present

## 2023-07-06 DIAGNOSIS — K56609 Unspecified intestinal obstruction, unspecified as to partial versus complete obstruction: Secondary | ICD-10-CM | POA: Diagnosis not present

## 2023-07-06 DIAGNOSIS — R4182 Altered mental status, unspecified: Secondary | ICD-10-CM | POA: Diagnosis not present

## 2023-07-06 DIAGNOSIS — G9341 Metabolic encephalopathy: Secondary | ICD-10-CM | POA: Diagnosis not present

## 2023-07-06 DIAGNOSIS — R6520 Severe sepsis without septic shock: Secondary | ICD-10-CM | POA: Diagnosis present

## 2023-07-06 DIAGNOSIS — J9611 Chronic respiratory failure with hypoxia: Secondary | ICD-10-CM | POA: Diagnosis present

## 2023-07-06 DIAGNOSIS — Z91048 Other nonmedicinal substance allergy status: Secondary | ICD-10-CM

## 2023-07-06 DIAGNOSIS — R7989 Other specified abnormal findings of blood chemistry: Secondary | ICD-10-CM | POA: Diagnosis present

## 2023-07-06 DIAGNOSIS — R935 Abnormal findings on diagnostic imaging of other abdominal regions, including retroperitoneum: Secondary | ICD-10-CM | POA: Diagnosis not present

## 2023-07-06 DIAGNOSIS — K6389 Other specified diseases of intestine: Secondary | ICD-10-CM | POA: Diagnosis not present

## 2023-07-06 DIAGNOSIS — Z860100 Personal history of colon polyps, unspecified: Secondary | ICD-10-CM | POA: Diagnosis not present

## 2023-07-06 DIAGNOSIS — N179 Acute kidney failure, unspecified: Secondary | ICD-10-CM | POA: Diagnosis not present

## 2023-07-06 DIAGNOSIS — R0689 Other abnormalities of breathing: Secondary | ICD-10-CM | POA: Diagnosis not present

## 2023-07-06 DIAGNOSIS — R0600 Dyspnea, unspecified: Secondary | ICD-10-CM | POA: Diagnosis not present

## 2023-07-06 DIAGNOSIS — K8000 Calculus of gallbladder with acute cholecystitis without obstruction: Secondary | ICD-10-CM | POA: Diagnosis present

## 2023-07-06 DIAGNOSIS — I6782 Cerebral ischemia: Secondary | ICD-10-CM | POA: Diagnosis not present

## 2023-07-06 DIAGNOSIS — Z885 Allergy status to narcotic agent status: Secondary | ICD-10-CM | POA: Diagnosis not present

## 2023-07-06 DIAGNOSIS — Z7902 Long term (current) use of antithrombotics/antiplatelets: Secondary | ICD-10-CM | POA: Diagnosis not present

## 2023-07-06 DIAGNOSIS — K8689 Other specified diseases of pancreas: Secondary | ICD-10-CM | POA: Diagnosis not present

## 2023-07-06 DIAGNOSIS — R0609 Other forms of dyspnea: Secondary | ICD-10-CM | POA: Diagnosis not present

## 2023-07-06 DIAGNOSIS — K5669 Other partial intestinal obstruction: Secondary | ICD-10-CM | POA: Diagnosis not present

## 2023-07-06 DIAGNOSIS — Z888 Allergy status to other drugs, medicaments and biological substances status: Secondary | ICD-10-CM

## 2023-07-06 DIAGNOSIS — E870 Hyperosmolality and hypernatremia: Secondary | ICD-10-CM | POA: Diagnosis not present

## 2023-07-06 DIAGNOSIS — R0602 Shortness of breath: Secondary | ICD-10-CM | POA: Diagnosis not present

## 2023-07-06 DIAGNOSIS — E869 Volume depletion, unspecified: Secondary | ICD-10-CM | POA: Diagnosis present

## 2023-07-06 DIAGNOSIS — R0989 Other specified symptoms and signs involving the circulatory and respiratory systems: Secondary | ICD-10-CM | POA: Diagnosis not present

## 2023-07-06 DIAGNOSIS — E871 Hypo-osmolality and hyponatremia: Secondary | ICD-10-CM | POA: Diagnosis present

## 2023-07-06 DIAGNOSIS — R1084 Generalized abdominal pain: Secondary | ICD-10-CM | POA: Diagnosis not present

## 2023-07-06 DIAGNOSIS — J440 Chronic obstructive pulmonary disease with acute lower respiratory infection: Secondary | ICD-10-CM | POA: Diagnosis present

## 2023-07-06 DIAGNOSIS — K310 Acute dilatation of stomach: Secondary | ICD-10-CM | POA: Diagnosis not present

## 2023-07-06 DIAGNOSIS — R6510 Systemic inflammatory response syndrome (SIRS) of non-infectious origin without acute organ dysfunction: Secondary | ICD-10-CM | POA: Diagnosis not present

## 2023-07-06 DIAGNOSIS — R1010 Upper abdominal pain, unspecified: Secondary | ICD-10-CM | POA: Diagnosis not present

## 2023-07-06 DIAGNOSIS — E876 Hypokalemia: Secondary | ICD-10-CM | POA: Diagnosis not present

## 2023-07-06 DIAGNOSIS — F419 Anxiety disorder, unspecified: Secondary | ICD-10-CM | POA: Diagnosis present

## 2023-07-06 DIAGNOSIS — J439 Emphysema, unspecified: Secondary | ICD-10-CM | POA: Diagnosis not present

## 2023-07-06 DIAGNOSIS — K760 Fatty (change of) liver, not elsewhere classified: Secondary | ICD-10-CM | POA: Diagnosis present

## 2023-07-06 DIAGNOSIS — J189 Pneumonia, unspecified organism: Secondary | ICD-10-CM | POA: Diagnosis present

## 2023-07-06 DIAGNOSIS — I2510 Atherosclerotic heart disease of native coronary artery without angina pectoris: Secondary | ICD-10-CM | POA: Diagnosis not present

## 2023-07-06 DIAGNOSIS — Z87891 Personal history of nicotine dependence: Secondary | ICD-10-CM

## 2023-07-06 DIAGNOSIS — R110 Nausea: Secondary | ICD-10-CM | POA: Diagnosis not present

## 2023-07-06 DIAGNOSIS — N261 Atrophy of kidney (terminal): Secondary | ICD-10-CM | POA: Diagnosis not present

## 2023-07-06 DIAGNOSIS — R109 Unspecified abdominal pain: Secondary | ICD-10-CM | POA: Diagnosis not present

## 2023-07-06 DIAGNOSIS — K769 Liver disease, unspecified: Secondary | ICD-10-CM | POA: Diagnosis present

## 2023-07-06 DIAGNOSIS — J449 Chronic obstructive pulmonary disease, unspecified: Secondary | ICD-10-CM | POA: Diagnosis not present

## 2023-07-06 DIAGNOSIS — A419 Sepsis, unspecified organism: Secondary | ICD-10-CM | POA: Diagnosis present

## 2023-07-06 DIAGNOSIS — R112 Nausea with vomiting, unspecified: Secondary | ICD-10-CM | POA: Diagnosis not present

## 2023-07-06 DIAGNOSIS — Z91013 Allergy to seafood: Secondary | ICD-10-CM

## 2023-07-06 DIAGNOSIS — I739 Peripheral vascular disease, unspecified: Secondary | ICD-10-CM | POA: Diagnosis present

## 2023-07-06 DIAGNOSIS — J9601 Acute respiratory failure with hypoxia: Secondary | ICD-10-CM | POA: Diagnosis present

## 2023-07-06 DIAGNOSIS — R609 Edema, unspecified: Secondary | ICD-10-CM | POA: Diagnosis present

## 2023-07-06 DIAGNOSIS — K8020 Calculus of gallbladder without cholecystitis without obstruction: Secondary | ICD-10-CM | POA: Diagnosis not present

## 2023-07-06 DIAGNOSIS — Z4682 Encounter for fitting and adjustment of non-vascular catheter: Secondary | ICD-10-CM | POA: Diagnosis not present

## 2023-07-06 DIAGNOSIS — I10 Essential (primary) hypertension: Secondary | ICD-10-CM | POA: Diagnosis not present

## 2023-07-06 DIAGNOSIS — R1111 Vomiting without nausea: Secondary | ICD-10-CM | POA: Diagnosis not present

## 2023-07-06 DIAGNOSIS — J90 Pleural effusion, not elsewhere classified: Secondary | ICD-10-CM | POA: Diagnosis not present

## 2023-07-06 DIAGNOSIS — K828 Other specified diseases of gallbladder: Secondary | ICD-10-CM | POA: Diagnosis present

## 2023-07-06 LAB — CBC WITH DIFFERENTIAL/PLATELET
Abs Immature Granulocytes: 0.05 10*3/uL (ref 0.00–0.07)
Basophils Absolute: 0.1 10*3/uL (ref 0.0–0.1)
Basophils Relative: 1 %
Eosinophils Absolute: 0 10*3/uL (ref 0.0–0.5)
Eosinophils Relative: 0 %
HCT: 43.9 % (ref 36.0–46.0)
Hemoglobin: 14.2 g/dL (ref 12.0–15.0)
Immature Granulocytes: 0 %
Lymphocytes Relative: 4 %
Lymphs Abs: 0.6 10*3/uL — ABNORMAL LOW (ref 0.7–4.0)
MCH: 34.9 pg — ABNORMAL HIGH (ref 26.0–34.0)
MCHC: 32.3 g/dL (ref 30.0–36.0)
MCV: 107.9 fL — ABNORMAL HIGH (ref 80.0–100.0)
Monocytes Absolute: 1.2 10*3/uL — ABNORMAL HIGH (ref 0.1–1.0)
Monocytes Relative: 8 %
Neutro Abs: 14.2 10*3/uL — ABNORMAL HIGH (ref 1.7–7.7)
Neutrophils Relative %: 87 %
Platelets: 328 10*3/uL (ref 150–400)
RBC: 4.07 MIL/uL (ref 3.87–5.11)
RDW: 13.4 % (ref 11.5–15.5)
WBC: 16.1 10*3/uL — ABNORMAL HIGH (ref 4.0–10.5)
nRBC: 0 % (ref 0.0–0.2)

## 2023-07-06 LAB — COMPREHENSIVE METABOLIC PANEL WITH GFR
ALT: 22 U/L (ref 0–44)
AST: 25 U/L (ref 15–41)
Albumin: 3.3 g/dL — ABNORMAL LOW (ref 3.5–5.0)
Alkaline Phosphatase: 70 U/L (ref 38–126)
Anion gap: 11 (ref 5–15)
BUN: 24 mg/dL — ABNORMAL HIGH (ref 8–23)
CO2: 28 mmol/L (ref 22–32)
Calcium: 8.9 mg/dL (ref 8.9–10.3)
Chloride: 101 mmol/L (ref 98–111)
Creatinine, Ser: 0.91 mg/dL (ref 0.44–1.00)
GFR, Estimated: >60 mL/min (ref >60)
Glucose, Bld: 134 mg/dL — ABNORMAL HIGH (ref 70–99)
Potassium: 4 mmol/L (ref 3.5–5.1)
Sodium: 140 mmol/L (ref 135–145)
Total Bilirubin: 1.1 mg/dL (ref 0.0–1.2)
Total Protein: 7 g/dL (ref 6.5–8.1)

## 2023-07-06 LAB — LIPASE, BLOOD: Lipase: 24 U/L (ref 11–51)

## 2023-07-06 LAB — LACTIC ACID, PLASMA: Lactic Acid, Venous: 1.4 mmol/L (ref 0.5–1.9)

## 2023-07-06 MED ORDER — ALBUTEROL SULFATE (2.5 MG/3ML) 0.083% IN NEBU
2.5000 mg | INHALATION_SOLUTION | RESPIRATORY_TRACT | Status: DC | PRN
  Administered 2023-07-12 – 2023-07-13 (×2): 2.5 mg via RESPIRATORY_TRACT
  Filled 2023-07-06 (×2): qty 3

## 2023-07-06 MED ORDER — MORPHINE SULFATE (PF) 2 MG/ML IV SOLN
2.0000 mg | INTRAVENOUS | Status: DC | PRN
  Administered 2023-07-07 (×2): 2 mg via INTRAVENOUS
  Filled 2023-07-06 (×2): qty 1

## 2023-07-06 MED ORDER — ACETAMINOPHEN 160 MG/5ML PO SOLN: 650.0000 mg | Freq: Four times a day (QID) | ORAL | Status: DC | PRN

## 2023-07-06 MED ORDER — DEXTROSE IN LACTATED RINGERS 5 % IV SOLN: INTRAVENOUS | Status: DC

## 2023-07-06 MED ORDER — SODIUM CHLORIDE 0.9 % IV BOLUS
1000.0000 mL | Freq: Once | INTRAVENOUS | Status: AC
  Administered 2023-07-06: 1000 mL via INTRAVENOUS

## 2023-07-06 MED ORDER — PIPERACILLIN-TAZOBACTAM 3.375 G IVPB
3.3750 g | Freq: Three times a day (TID) | INTRAVENOUS | Status: DC
  Administered 2023-07-07 – 2023-07-13 (×19): 3.375 g via INTRAVENOUS
  Filled 2023-07-06 (×18): qty 50

## 2023-07-06 MED ORDER — UMECLIDINIUM-VILANTEROL 62.5-25 MCG/ACT IN AEPB
1.0000 | INHALATION_SPRAY | Freq: Every day | RESPIRATORY_TRACT | Status: DC
  Administered 2023-07-07 – 2023-07-08 (×2): 1 via RESPIRATORY_TRACT
  Filled 2023-07-06: qty 14

## 2023-07-06 MED ORDER — ONDANSETRON HCL 4 MG/2ML IJ SOLN
4.0000 mg | Freq: Four times a day (QID) | INTRAMUSCULAR | Status: DC | PRN
  Administered 2023-07-07 – 2023-07-10 (×3): 4 mg via INTRAVENOUS
  Filled 2023-07-06 (×3): qty 2

## 2023-07-06 MED ORDER — ACETAMINOPHEN 650 MG RE SUPP: 650.0000 mg | Freq: Four times a day (QID) | RECTAL | Status: DC | PRN

## 2023-07-06 MED ORDER — KETOROLAC TROMETHAMINE 30 MG/ML IJ SOLN
15.0000 mg | Freq: Once | INTRAMUSCULAR | Status: AC
  Administered 2023-07-06: 15 mg via INTRAVENOUS
  Filled 2023-07-06: qty 1

## 2023-07-06 MED ORDER — ONDANSETRON HCL 4 MG/2ML IJ SOLN
4.0000 mg | Freq: Once | INTRAMUSCULAR | Status: AC
Start: 2023-07-06 — End: 2023-07-06
  Administered 2023-07-06: 4 mg via INTRAVENOUS
  Filled 2023-07-06: qty 2

## 2023-07-06 MED ORDER — PIPERACILLIN-TAZOBACTAM 3.375 G IVPB 30 MIN
3.3750 g | Freq: Once | INTRAVENOUS | Status: AC
  Administered 2023-07-06: 3.375 g via INTRAVENOUS
  Filled 2023-07-06: qty 50

## 2023-07-06 MED ORDER — ONDANSETRON HCL 4 MG PO TABS: 4.0000 mg | ORAL_TABLET | Freq: Four times a day (QID) | ORAL | Status: DC | PRN

## 2023-07-06 MED ORDER — IOHEXOL 300 MG/ML  SOLN
100.0000 mL | Freq: Once | INTRAMUSCULAR | Status: AC | PRN
  Administered 2023-07-06: 100 mL via INTRAVENOUS

## 2023-07-06 NOTE — Assessment & Plan Note (Signed)
 Stable.  Not on medications.

## 2023-07-06 NOTE — ED Triage Notes (Signed)
 Pt BIB [facility] from home due to n/v and abd pain. Pain started 3 days ago and pt has very distended abdomen. Pt stated that she has not eaten or drank anything in 3 days and is just dry heaving at this time/

## 2023-07-06 NOTE — Progress Notes (Signed)
 RSA  Lactic acid reassuring.  Deena Farrier, MD

## 2023-07-06 NOTE — ED Notes (Signed)
 ED TO INPATIENT HANDOFF REPORT  ED Nurse Name and Phone #: Milda Aline [phone]  S Name/Age/Gender Desiree Barber 73 y.o. female Room/Bed: APA02/APA02  Code Status   Code Status: Prior  Home/SNF/Other Home Patient oriented to: self, place, time, and situation Is this baseline? Yes   Triage Complete: Triage complete  Chief Complaint SBO (small bowel obstruction) (HCC) [K56.609]  Triage Note Pt BIB [facility] from home due to n/v and abd pain. Pain started 3 days ago and pt has very distended abdomen. Pt stated that she has not eaten or drank anything in 3 days and is just dry heaving at this time/    Allergies Allergies  Allergen Reactions   Demerol [Meperidine] Nausea And Vomiting   Oxycontin [Oxycodone Hcl] Nausea And Vomiting   Fish Allergy Nausea Only    Only fish from [place]   Lavender Oil Itching    And hives   Other Itching    Flower oils-gets hives too    Level of Care/Admitting Diagnosis ED Disposition     ED Disposition  Admit   Condition  --   Comment  Hospital Area: [facility] [[id number]]  Level of Care: Med-Surg [16]  Covid Evaluation: Asymptomatic - no recent exposure (last 10 days) testing not required  Diagnosis: SBO (small bowel obstruction) [facility]) [[id number]]  Admitting Physician: EMOKPAE, EJIROGHENE E [phone]  Attending Physician: EMOKPAE, EJIROGHENE E [email or web address]          B Medical/Surgery History Past Medical History:  Diagnosis Date   Anemia    Anxiety    Occasional   Arthritis    COPD (chronic obstructive pulmonary disease) (HCC)    HTN (hypertension)    Iron deficiency anemia due to chronic blood loss 03/28/2022   Peripheral arterial disease (HCC)    Past Surgical History:  Procedure Laterality Date   ABDOMINAL AORTOGRAM W/LOWER EXTREMITY N/A 04/10/2022   Procedure: ABDOMINAL AORTOGRAM W/LOWER EXTREMITY;  Surgeon: Margherita Shell, MD;  Location: [facility];  Service: Cardiovascular;  Laterality: N/A;   BIOPSY   08/27/2022   Procedure: BIOPSY;  Surgeon: Vinetta Greening, DO;  Location: [facility];  Service: Endoscopy;;   BIOPSY  03/25/2023   Procedure: BIOPSY;  Surgeon: Vinetta Greening, DO;  [facility];  Service: Endoscopy;;   COLONOSCOPY  02/23/04   RMR: normal rectum and colon.minimal internal hemorrhoids   COLONOSCOPY WITH PROPOFOL N/A 06/13/2015   Procedure: COLONOSCOPY WITH PROPOFOL;  Surgeon: Suzette Espy, MD;  Location: [facility];  Service: Endoscopy;  Laterality: N/A;  1045   COLONOSCOPY WITH PROPOFOL N/A 08/27/2022   Procedure: COLONOSCOPY WITH PROPOFOL;  Surgeon: Vinetta Greening, DO;  [facility];  Service: Endoscopy;  Laterality: N/A;  8:45 am, asa 3   ENTEROSCOPY N/A 03/25/2023   Procedure: ENTEROSCOPY;  Surgeon: Vinetta Greening, DO;  [facility];  Service: Endoscopy;  Laterality: N/A;  115pm, asa 3   ESOPHAGOGASTRODUODENOSCOPY (EGD) WITH PROPOFOL N/A 08/27/2022   Procedure: ESOPHAGOGASTRODUODENOSCOPY (EGD) WITH PROPOFOL;  Surgeon: Vinetta Greening, DO;  [facility];  Service: Endoscopy;  Laterality: N/A;   GIVENS CAPSULE STUDY N/A 01/29/2023   Procedure: GIVENS CAPSULE STUDY;  Surgeon: Vinetta Greening, DO;  [facility];  Service: Endoscopy;  Laterality: N/A;  730am   HOT HEMOSTASIS  08/27/2022   Procedure: HOT HEMOSTASIS (ARGON PLASMA COAGULATION/BICAP);  Surgeon: Vinetta Greening, DO;  [facility];  Service: Endoscopy;;  HOT HEMOSTASIS  03/25/2023   Procedure: HOT HEMOSTASIS (ARGON PLASMA COAGULATION/BICAP);  Surgeon: Vinetta Greening, DO;  [facility];  Service: Endoscopy;;   POLYPECTOMY  06/13/2015   Procedure: POLYPECTOMY;  Surgeon: Suzette Espy, MD;  [facility];  Service: Endoscopy;;  sigmoid colon polyp   TOOTH EXTRACTION     TRIGGER FINGER RELEASE Bilateral    TUBAL LIGATION       A IV Location/Drains/Wounds Patient Lines/Drains/Airways Status     Active  Line/Drains/Airways     Name Placement date Placement time Site Days   Peripheral IV 07/06/23 20 G Anterior;Proximal;Right Forearm 07/06/23  1536  Forearm  less than 1   NG/OG Vented/Dual Lumen 16 Fr. Right nare Marking at nare/corner of mouth 60 cm 07/06/23  2008  Right nare  less than 1            Intake/Output Last 24 hours No intake or output data in the 24 hours ending 07/06/23 2034  Labs/Imaging Results for orders placed or performed during the hospital encounter of 07/06/23 (from the past 48 hours)  CBC with Differential     Status: Abnormal   Collection Time: 07/06/23  4:22 PM  Result Value Ref Range   WBC 16.1 (H) 4.0 - 10.5 K/uL   RBC 4.07 3.87 - 5.11 MIL/uL   Hemoglobin 14.2 12.0 - 15.0 g/dL   HCT 27.0 62.3 - 76.2 %   MCV 107.9 (H) 80.0 - 100.0 fL   MCH 34.9 (H) 26.0 - 34.0 pg   MCHC 32.3 30.0 - 36.0 g/dL   RDW 83.1 51.7 - 61.6 %   Platelets 328 150 - 400 K/uL   nRBC 0.0 0.0 - 0.2 %   Neutrophils Relative % 87 %   Neutro Abs 14.2 (H) 1.7 - 7.7 K/uL   Lymphocytes Relative 4 %   Lymphs Abs 0.6 (L) 0.7 - 4.0 K/uL   Monocytes Relative 8 %   Monocytes Absolute 1.2 (H) 0.1 - 1.0 K/uL   Eosinophils Relative 0 %   Eosinophils Absolute 0.0 0.0 - 0.5 K/uL   Basophils Relative 1 %   Basophils Absolute 0.1 0.0 - 0.1 K/uL   Immature Granulocytes 0 %   Abs Immature Granulocytes 0.05 0.00 - 0.07 K/uL    Comment: Performed at [facility], [address], [place], [place] [address]  Comprehensive metabolic panel     Status: Abnormal   Collection Time: 07/06/23  4:22 PM  Result Value Ref Range   Sodium 140 135 - 145 mmol/L   Potassium 4.0 3.5 - 5.1 mmol/L   Chloride 101 98 - 111 mmol/L   CO2 28 22 - 32 mmol/L   Glucose, Bld 134 (H) 70 - 99 mg/dL    Comment: Glucose reference range applies only to samples taken after fasting for at least 8 hours.   BUN 24 (H) 8 - 23 mg/dL   Creatinine, Ser 0.62 0.44 - 1.00 mg/dL   Calcium 8.9 8.9 - 69.4 mg/dL   Total Protein 7.0 6.5 -  8.1 g/dL   Albumin 3.3 (L) 3.5 - 5.0 g/dL   AST 25 15 - 41 U/L   ALT 22 0 - 44 U/L   Alkaline Phosphatase 70 38 - 126 U/L   Total Bilirubin 1.1 0.0 - 1.2 mg/dL   GFR, Estimated >85 >46 mL/min    Comment: (NOTE) Calculated using the CKD-EPI Creatinine Equation (2021)    Anion gap 11 5 - 15  Comment: Performed at [facility], [address], [place], [place] [address]  Lipase, blood     Status: None   Collection Time: 07/06/23  4:22 PM  Result Value Ref Range   Lipase 24 11 - 51 U/L    Comment: Performed at [facility], [address] [place], [place] [address]  Lactic acid, plasma     Status: None   Collection Time: 07/06/23  7:42 PM  Result Value Ref Range   Lactic Acid, Venous 1.4 0.5 - 1.9 mmol/L    Comment: Performed at [facility], [address] [place], [place] [address]   CT ABDOMEN PELVIS W CONTRAST Result Date: 07/06/2023 CLINICAL DATA:  Acute abdominal pain EXAM: CT ABDOMEN AND PELVIS WITH CONTRAST TECHNIQUE: Multidetector CT imaging of the abdomen and pelvis was performed using the standard protocol following bolus administration of intravenous contrast. RADIATION DOSE REDUCTION: This exam was performed according to the departmental dose-optimization program which includes automated exposure control, adjustment of the mA and/or kV according to patient size and/or use of iterative reconstruction technique. CONTRAST:  100mL OMNIPAQUE IOHEXOL 300 MG/ML  SOLN COMPARISON:  CT abdomen and pelvis 07/15/2008. FINDINGS: Lower chest: There are tree-in-bud opacities in the right middle lobe, likely infectious/inflammatory. There is a ground-glass nodule in the left lower lobe measuring 5 mm image 3/11. Hepatobiliary: There is a hypodensity in the posterior right lobe of the liver measuring 12 mm which is not seen on prior measuring 55 Hounsfield units, indeterminate. A gallstone is present measuring 3 cm. The gallbladder is distended. There is no biliary ductal dilatation. Pancreas:  Unremarkable. No pancreatic ductal dilatation or surrounding inflammatory changes. Spleen: There is a calcified granuloma in the spleen. Spleen is otherwise within normal limits. Adrenals/Urinary Tract: Adrenal glands are unremarkable. Kidneys are normal, without renal calculi, focal lesion, or hydronephrosis. Bladder is unremarkable. Stomach/Bowel: There are mildly dilated small bowel loops throughout the abdomen and pelvis with some associated mesenteric edema and air-fluid levels. Transition point is seen in the distal ileum in the right lower quadrant. No pneumatosis or free air. Colon is nondilated. Stomach is moderately distended. There is a small hiatal hernia. The appendix is not visualized. Vascular/Lymphatic: Aortic atherosclerosis. No enlarged abdominal or pelvic lymph nodes. Reproductive: Uterus and bilateral adnexa are unremarkable. Other: No abdominal wall hernia or abnormality. No abdominopelvic ascites. Musculoskeletal: There are degenerative changes of the lumbar spine. IMPRESSION: 1. Small-bowel obstruction with transition point in the distal ileum in the right lower quadrant. Mesenteric edema is present. No pneumatosis or free air. 2. Cholelithiasis with distended gallbladder. 3. Indeterminate 12 mm hypodensity in the right lobe of the liver. Recommend further evaluation with ultrasound or MRI. 4. Tree-in-bud opacities in the right middle lobe, likely infectious/inflammatory. 5. 5 mm ground-glass nodule in the left lower lobe. No follow-up recommended. This recommendation follows the consensus statement: Guidelines for Management of Incidental Pulmonary Nodules Detected on CT Images: From the Fleischner Society 2017; Radiology 2017; 284:228-243. 6. Aortic atherosclerosis. Electronically Signed   By: Tyron Gallon M.D.   On: 07/06/2023 19:03    Pending Labs Unresulted Labs (From admission, onward)     Start     Ordered   07/06/23 1919  Lactic acid, plasma  (Lactic Acid)  Now then every 2  hours,   R      07/06/23 1918            Vitals/Pain Today's Vitals   07/06/23 1730 07/06/23 1745 07/06/23 1800 07/06/23 1815  BP: (!) 144/61  136/67 137/65 138/68  Pulse: 100 100 100 99  Resp: (!) 22 (!) 26 (!) 24 (!) 25  Temp:      TempSrc:      SpO2: 100% 97% 100% 99%  Weight:      Height:      PainSc:        Isolation Precautions No active isolations  Medications Medications  sodium chloride 0.9 % bolus 1,000 mL (1,000 mLs Intravenous Bolus 07/06/23 1612)  ondansetron (ZOFRAN) injection 4 mg (4 mg Intravenous Given 07/06/23 1612)  ketorolac (TORADOL) 30 MG/ML injection 15 mg (15 mg Intravenous Given 07/06/23 1612)  piperacillin-tazobactam (ZOSYN) IVPB 3.375 g (0 g Intravenous Stopped 07/06/23 1846)  iohexol (OMNIPAQUE) 300 MG/ML solution 100 mL (100 mLs Intravenous Contrast Given 07/06/23 1824)    Mobility walks     Focused Assessments    R Recommendations: See Admitting Provider Note  Report given to:   Additional Notes:

## 2023-07-06 NOTE — Assessment & Plan Note (Signed)
Stable.  Resume home regimen 

## 2023-07-06 NOTE — Assessment & Plan Note (Addendum)
 Presenting with abdominal pain, dry heaving, abdominal distention of 3 days duration.  Last bowel movement probably 3 days ago.  Denies prior abdominal surgeries or prior SBO.  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  08/2022- Colonoscopy unremarkable. -CT abdomen and pelvis w contrast-small bowel obstruction with transition point in distal ileum in the right lower quadrant. -EDP talked to Dr. Collene Dawson, will see in consult, hold Plavix, IV fluids, n.p.o., NG tube- placed -IV morphine 2 mg every 4 hourly as needed -1 L bolus given, cont D5 LR 75cc/hr X 1 day -Preop Zosyn given for intra-abdominal infection per general surgery. -Considering abdominal distention, leukocytosis of 16.1, CT findings-infection/inflammation right middle lobe, continue Zosyn.  -Hold Plavix, last Plavix dose at least 3 days ago

## 2023-07-06 NOTE — H&P (Addendum)
 History and Physical    Desiree Barber NWG:[id number] DOB: 1950-10-07 DOA: 07/06/2023  PCP: Minus Amel, MD   Patient coming from: Home  I have personally briefly reviewed patient's old medical records in [facility]  Chief Complaint: Abdominal Pain  HPI: Desiree Barber is a 73 y.o. female with medical history significant for chronic respiratory failure on as needed O2, COPD, hypertension, peripheral artery disease. Patient presented to the ED with complaints of abdominal pain with distention of 3 days duration.  She reports nausea, dry heaving but no real vomiting.  She thinks her last bowel movement was 3 days ago.  She denies prior episodes of small bowel obstruction, she denies prior abdominal surgeries.  ED Course: Heart rate 95-105.  Temperature 97.8.  Sats 93 to 100% on room air. WBC 16.1. CT abdomen and pelvis W contrast-small bowel obstruction with transition point in the distal ileum in the right lower quadrant.  Mesenteric edema present.  No pneumatosis or free air.  Cholelithiasis with distended gallbladder. EDP talked to general surgeon Dr. Collene Dawson, will see in consult in a.m., NG tube, p.o., IV fluids, hold Plavix.  Review of Systems: As per HPI all other systems reviewed and negative.  Past Medical History:  Diagnosis Date   Anemia    Anxiety    Occasional   Arthritis    COPD (chronic obstructive pulmonary disease) (HCC)    HTN (hypertension)    Iron deficiency anemia due to chronic blood loss 03/28/2022   Peripheral arterial disease [facility])     Past Surgical History:  Procedure Laterality Date   ABDOMINAL AORTOGRAM W/LOWER EXTREMITY N/A 04/10/2022   Procedure: ABDOMINAL AORTOGRAM W/LOWER EXTREMITY;  Surgeon: Margherita Shell, MD;  [facility]: [facility];  Service: Cardiovascular;  Laterality: N/A;   BIOPSY  08/27/2022   Procedure: BIOPSY;  Surgeon: Vinetta Greening, DO;  Location: [facility];  Service: Endoscopy;;   BIOPSY  03/25/2023    Procedure: BIOPSY;  Surgeon: Vinetta Greening, DO;  [facility]: [facility];  Service: Endoscopy;;   COLONOSCOPY  02/23/04   RMR: normal rectum and colon.minimal internal hemorrhoids   COLONOSCOPY WITH PROPOFOL N/A 06/13/2015   Procedure: COLONOSCOPY WITH PROPOFOL;  Surgeon: Suzette Espy, MD;  Location: [facility];  Service: Endoscopy;  Laterality: N/A;  1045   COLONOSCOPY WITH PROPOFOL N/A 08/27/2022   Procedure: COLONOSCOPY WITH PROPOFOL;  Surgeon: Vinetta Greening, DO;  [facility]: [facility];  Service: Endoscopy;  Laterality: N/A;  8:45 am, asa 3   ENTEROSCOPY N/A 03/25/2023   Procedure: ENTEROSCOPY;  Surgeon: Vinetta Greening, DO;  [facility]: [facility];  Service: Endoscopy;  Laterality: N/A;  115pm, asa 3   ESOPHAGOGASTRODUODENOSCOPY (EGD) WITH PROPOFOL N/A 08/27/2022   Procedure: ESOPHAGOGASTRODUODENOSCOPY (EGD) WITH PROPOFOL;  Surgeon: Vinetta Greening, DO;  [facility];  Service: Endoscopy;  Laterality: N/A;   GIVENS CAPSULE STUDY N/A 01/29/2023   Procedure: GIVENS CAPSULE STUDY;  Surgeon: Vinetta Greening, DO;  [facility];  Service: Endoscopy;  Laterality: N/A;  730am   HOT HEMOSTASIS  08/27/2022   Procedure: HOT HEMOSTASIS (ARGON PLASMA COAGULATION/BICAP);  Surgeon: Vinetta Greening, DO;  [facility];  Service: Endoscopy;;   HOT HEMOSTASIS  03/25/2023   Procedure: HOT HEMOSTASIS (ARGON PLASMA COAGULATION/BICAP);  Surgeon: Vinetta Greening, DO;  [facility];  Service: Endoscopy;;   POLYPECTOMY  06/13/2015   Procedure: POLYPECTOMY;  Surgeon: Suzette Espy, MD;  [facility];  Service: Endoscopy;;  sigmoid colon polyp   TOOTH EXTRACTION     TRIGGER FINGER RELEASE Bilateral    TUBAL LIGATION       reports that she quit smoking about 2 years ago. Her smoking use included cigarettes. She started smoking about 57 years ago. She has a 27.5 pack-year smoking history. She has been exposed to tobacco smoke. She has quit using  smokeless tobacco. She reports current alcohol use. She reports that she does not use drugs.  Allergies  Allergen Reactions   Demerol [Meperidine] Nausea And Vomiting   Oxycontin [Oxycodone Hcl] Nausea And Vomiting   Fish Allergy Nausea Only    Only fish from [place]   Lavender Oil Itching    And hives   Other Itching    Flower oils-gets hives too    Family History  Problem Relation Age of Onset   Colon cancer Neg Hx    Breast cancer Neg Hx    Prior to Admission medications   Medication Sig Start Date End Date Taking? Authorizing Provider  albuterol (PROVENTIL) (2.5 MG/3ML) 0.083% nebulizer solution Up to every 4 hours as needed Patient taking differently: Take 2.5 mg by nebulization every 4 (four) hours as needed for wheezing or shortness of breath. 07/10/22  Yes Diamond Formica, MD  albuterol (VENTOLIN HFA) 108 (90 Base) MCG/ACT inhaler Inhale 1-2 puffs into the lungs every 6 (six) hours as needed for shortness of breath or wheezing. 02/20/22  Yes [provider]  ALPRAZolam (XANAX) 0.25 MG tablet Take 0.25 mg by mouth daily as needed for anxiety or sleep. 06/12/21  Yes [provider]  Ascorbic Acid (VITAMIN C) 100 MG tablet Take 100 mg by mouth daily.   Yes [provider]  clopidogrel (PLAVIX) 75 MG tablet TAKE (1) TABLET BY MOUTH ONCE DAILY. Patient taking differently: Take 75 mg by mouth daily. 04/30/23  Yes Margherita Shell, MD  ferrous sulfate 324 (65 Fe) MG TBEC Take 1 tablet (325 mg total) by mouth daily with breakfast. Patient taking differently: Take 1 tablet by mouth 2 (two) times daily. 02/13/23 02/13/24 Yes Carver, Charles K, DO  pantoprazole (PROTONIX) 40 MG tablet Take 1 tablet (40 mg total) by mouth 2 (two) times daily. 09/06/22 09/06/23 Yes Carver, Charles K, DO  umeclidinium-vilanterol (ANORO ELLIPTA) 62.5-25 MCG/ACT AEPB Inhale 1 puff into the lungs daily. 12/13/22  Yes Diamond Formica, MD  vitamin B-12 (CYANOCOBALAMIN) 100 MCG tablet  Take 100 mcg by mouth daily.   Yes [provider]  VITAMIN D, CHOLECALCIFEROL, PO Take 1 tablet by mouth daily. One daily   Yes [provider]    Physical Exam: Vitals:   07/06/23 1730 07/06/23 1745 07/06/23 1800 07/06/23 1815  BP: (!) 144/61 136/67 137/65 138/68  Pulse: 100 100 100 99  Resp: (!) 22 (!) 26 (!) 24 (!) 25  Temp:      TempSrc:      SpO2: 100% 97% 100% 99%  Weight:      Height:        Constitutional: Anxious, appears uncomfortable Vitals:   07/06/23 1730 07/06/23 1745 07/06/23 1800 07/06/23 1815  BP: (!) 144/61 136/67 137/65 138/68  Pulse: 100 100 100 99  Resp: (!) 22 (!) 26 (!) 24 (!) 25  Temp:      TempSrc:      SpO2: 100% 97% 100% 99%  Weight:      Height:       Eyes:  PERRL, lids and conjunctivae normal ENMT: Mucous membranes are moist. Neck: normal, supple, no masses, no thyromegaly Respiratory: clear to auscultation bilaterally, no wheezing, no crackles. Normal respiratory effort. No accessory muscle use.  Cardiovascular: Regular rate and rhythm, no murmurs / rubs / gallops. No extremity edema. 2+ pedal pulses. No carotid bruits.  Abdomen: NG tube in place, abdomen distended, tense.  Musculoskeletal: no clubbing / cyanosis. No joint deformity upper and lower extremities.  Skin: no rashes, lesions, ulcers. No induration Neurologic: No facial asymmetry, moving extremities spontaneously, speech fluent. Psychiatric: Normal judgment and insight. Alert and oriented x 3. Normal mood.   Labs on Admission: I have personally reviewed following labs and imaging studies  CBC: Recent Labs  Lab 07/06/23 1622  WBC 16.1*  NEUTROABS 14.2*  HGB 14.2  HCT 43.9  MCV 107.9*  PLT 328   Basic Metabolic Panel: Recent Labs  Lab 07/06/23 1622  NA 140  K 4.0  CL 101  CO2 28  GLUCOSE 134*  BUN 24*  CREATININE 0.91  CALCIUM 8.9   GFR: Estimated Creatinine Clearance: 48.3 mL/min (by C-G formula based on SCr of 0.91 mg/dL). Liver Function  Tests: Recent Labs  Lab 07/06/23 1622  AST 25  ALT 22  ALKPHOS 70  BILITOT 1.1  PROT 7.0  ALBUMIN 3.3*   Recent Labs  Lab 07/06/23 1622  LIPASE 24    Urine analysis:    Component Value Date/Time   COLORURINE YELLOW 08/09/2020 1224   APPEARANCEUR CLEAR 08/09/2020 1224   LABSPEC 1.006 08/09/2020 1224   PHURINE 7.0 08/09/2020 1224   GLUCOSEU NEGATIVE 08/09/2020 1224   HGBUR NEGATIVE 08/09/2020 1224   BILIRUBINUR NEGATIVE 08/09/2020 1224   KETONESUR NEGATIVE 08/09/2020 1224   PROTEINUR NEGATIVE 08/09/2020 1224   NITRITE NEGATIVE 08/09/2020 1224   LEUKOCYTESUR NEGATIVE 08/09/2020 1224    Radiological Exams on Admission: DG Chest Portable 1 View Result Date: 07/06/2023 CLINICAL DATA:  Check gastric catheter placement EXAM: PORTABLE CHEST 1 VIEW COMPARISON:  12/23/2021 FINDINGS: Cardiac shadow is stable. Aortic calcifications are noted. Gastric catheter extends into the stomach. IMPRESSION: Gastric catheter within the stomach. Electronically Signed   By: Violeta Grey M.D.   On: 07/06/2023 20:44   CT ABDOMEN PELVIS W CONTRAST Result Date: 07/06/2023 CLINICAL DATA:  Acute abdominal pain EXAM: CT ABDOMEN AND PELVIS WITH CONTRAST TECHNIQUE: Multidetector CT imaging of the abdomen and pelvis was performed using the standard protocol following bolus administration of intravenous contrast. RADIATION DOSE REDUCTION: This exam was performed according to the departmental dose-optimization program which includes automated exposure control, adjustment of the mA and/or kV according to patient size and/or use of iterative reconstruction technique. CONTRAST:  100mL OMNIPAQUE IOHEXOL 300 MG/ML  SOLN COMPARISON:  CT abdomen and pelvis 07/15/2008. FINDINGS: Lower chest: There are tree-in-bud opacities in the right middle lobe, likely infectious/inflammatory. There is a ground-glass nodule in the left lower lobe measuring 5 mm image 3/11. Hepatobiliary: There is a hypodensity in the posterior right lobe  of the liver measuring 12 mm which is not seen on prior measuring 55 Hounsfield units, indeterminate. A gallstone is present measuring 3 cm. The gallbladder is distended. There is no biliary ductal dilatation. Pancreas: Unremarkable. No pancreatic ductal dilatation or surrounding inflammatory changes. Spleen: There is a calcified granuloma in the spleen. Spleen is otherwise within normal limits. Adrenals/Urinary Tract: Adrenal glands are unremarkable. Kidneys are normal, without renal calculi, focal lesion, or hydronephrosis. Bladder is unremarkable. Stomach/Bowel: There are mildly dilated small bowel loops throughout the  abdomen and pelvis with some associated mesenteric edema and air-fluid levels. Transition point is seen in the distal ileum in the right lower quadrant. No pneumatosis or free air. Colon is nondilated. Stomach is moderately distended. There is a small hiatal hernia. The appendix is not visualized. Vascular/Lymphatic: Aortic atherosclerosis. No enlarged abdominal or pelvic lymph nodes. Reproductive: Uterus and bilateral adnexa are unremarkable. Other: No abdominal wall hernia or abnormality. No abdominopelvic ascites. Musculoskeletal: There are degenerative changes of the lumbar spine. IMPRESSION: 1. Small-bowel obstruction with transition point in the distal ileum in the right lower quadrant. Mesenteric edema is present. No pneumatosis or free air. 2. Cholelithiasis with distended gallbladder. 3. Indeterminate 12 mm hypodensity in the right lobe of the liver. Recommend further evaluation with ultrasound or MRI. 4. Tree-in-bud opacities in the right middle lobe, likely infectious/inflammatory. 5. 5 mm ground-glass nodule in the left lower lobe. No follow-up recommended. This recommendation follows the consensus statement: Guidelines for Management of Incidental Pulmonary Nodules Detected on CT Images: From the Fleischner Society 2017; Radiology 2017; 284:228-243. 6. Aortic atherosclerosis.  Electronically Signed   By: Tyron Gallon M.D.   On: 07/06/2023 19:03   EKG: None  Assessment/Plan Principal Problem:   SBO (small bowel obstruction) (HCC) Active Problems:   SIRS (systemic inflammatory response syndrome) (HCC)   HTN (hypertension)   Chronic respiratory failure with hypoxia (HCC)   Liver lesion   COPD (chronic obstructive pulmonary disease) (HCC)   PAD (peripheral artery disease) (HCC)  Assessment and Plan: * SBO (small bowel obstruction) (HCC) Presenting with abdominal pain, dry heaving, abdominal distention of 3 days duration.  Last bowel movement probably 3 days ago.  Denies prior abdominal surgeries or prior SBO.  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  08/2022- Colonoscopy unremarkable. -CT abdomen and pelvis w contrast-small bowel obstruction with transition point in distal ileum in the right lower quadrant. -EDP talked to Dr. Collene Dawson, will see in consult, hold Plavix, IV fluids, n.p.o., NG tube- placed -IV morphine 2 mg every 4 hourly as needed -1 L bolus given, cont D5 LR 75cc/hr X 1 day -Preop Zosyn given for intra-abdominal infection per general surgery. -Considering abdominal distention, leukocytosis of 16.1, CT findings-infection/inflammation right middle lobe, continue Zosyn.  -Hold Plavix, last Plavix dose at least 3 days ago  SIRS (systemic inflammatory response syndrome) (HCC) Heart rate 95-105, tachypneic respirate rate 17- 30, leukocytosis of 16.1.  With normal lactic acid at 1.4.  CT with findings of small bowel obstruction with mesenteric edema no pneumatosis or free air, tree-in-bud opacities RML-infectious/inflammatory.  - -Abdomen distended, consider aspiration. -Continue Zosyn -Obtain blood cultures  PAD (peripheral artery disease) (HCC) Hold Plavix, last dose at least 3 days ago.  COPD (chronic obstructive pulmonary disease) (HCC) Stable. -Resume home regimen  Liver lesion Incidental finding on CT-  Indeterminate 12 mm hypodensity in the right lobe of the liver. Recommend further evaluation with ultrasound or MRI. -Needs follow-up  Chronic respiratory failure with hypoxia (HCC) On 2 L as needed.  Has not required it for quite a while.  Currently on room air sats 93 to 100%.  HTN (hypertension) Stable.  Not on medications.   DVT prophylaxis:  SCDS pending gen surg eval Code Status: FULL Code Family Communication: None at bedside Disposition Plan: ~ 2 days Consults called: Gen Surg Admission status: Inpt Med surg I certify that at the point of admission it is my clinical judgment that the patient will require inpatient hospital care spanning beyond 2  midnights from the point of admission due to high intensity of service, high risk for further deterioration and high frequency of surveillance required.   Author: Pati Bonine, MD 07/06/2023 9:28 PM  For on call review [email or web address].

## 2023-07-06 NOTE — Assessment & Plan Note (Signed)
 Incidental finding on CT- Indeterminate 12 mm hypodensity in the right lobe of the liver. Recommend further evaluation with ultrasound or MRI. -Needs follow-up

## 2023-07-06 NOTE — Progress Notes (Signed)
 [facility]  Ivf, npo, ng. Hold plavix.  Will see tomorrow. SBO with some edema in mesentery, no wall thickening or pneumatosis reported.   Deena Farrier, MD

## 2023-07-06 NOTE — Assessment & Plan Note (Signed)
 On 2 L as needed.  Has not required it for quite a while.  Currently on room air sats 93 to 100%.

## 2023-07-06 NOTE — Assessment & Plan Note (Signed)
 Hold Plavix, last dose at least 3 days ago.

## 2023-07-06 NOTE — Assessment & Plan Note (Signed)
 Heart rate 95-105, tachypneic respirate rate 17- 30, leukocytosis of 16.1.  With normal lactic acid at 1.4.  CT with findings of small bowel obstruction with mesenteric edema no pneumatosis or free air, tree-in-bud opacities RML-infectious/inflammatory.  - -Abdomen distended, consider aspiration. -Continue Zosyn -Obtain blood cultures

## 2023-07-06 NOTE — ED Provider Notes (Signed)
 [place] EMERGENCY DEPARTMENT AT [facility] Provider Note   CSN: [id number] Arrival date & time: 07/06/23  1535     History {Add pertinent medical, surgical, social history, OB history to HPI:1} Chief Complaint  Patient presents with   Abdominal Pain   Nausea   Weakness    Desiree Barber is a 73 y.o. female.  Patient has a history of COPD and hypertension.  She has been having abdominal pain and vomiting for 3 days.   Abdominal Pain Weakness Associated symptoms: abdominal pain        Home Medications Prior to Admission medications   Medication Sig Start Date End Date Taking? Authorizing Provider  albuterol (PROVENTIL) (2.5 MG/3ML) 0.083% nebulizer solution Up to every 4 hours as needed 07/10/22   Diamond Formica, MD  albuterol (VENTOLIN HFA) 108 (90 Base) MCG/ACT inhaler Inhale into the lungs. 02/20/22   [provider]  ALPRAZolam (XANAX) 0.25 MG tablet Take 0.25 mg by mouth daily as needed for anxiety or sleep. 06/12/21   [provider]  Ascorbic Acid (VITAMIN C) 100 MG tablet Take 100 mg by mouth daily.    [provider]  clopidogrel (PLAVIX) 75 MG tablet TAKE (1) TABLET BY MOUTH ONCE DAILY. 04/30/23   Margherita Shell, MD  ferrous sulfate 324 (65 Fe) MG TBEC Take 1 tablet (325 mg total) by mouth daily with breakfast. Patient taking differently: Take 1 tablet by mouth 2 (two) times daily. 02/13/23 02/13/24  Vinetta Greening, DO  OVER THE COUNTER MEDICATION Otc iron one daily    [provider]  pantoprazole (PROTONIX) 40 MG tablet Take 1 tablet (40 mg total) by mouth 2 (two) times daily. 09/06/22 09/06/23  Vinetta Greening, DO  umeclidinium-vilanterol (ANORO ELLIPTA) 62.5-25 MCG/ACT AEPB Inhale 1 puff into the lungs daily. 12/13/22   Diamond Formica, MD  vitamin B-12 (CYANOCOBALAMIN) 100 MCG tablet Take 100 mcg by mouth daily.    [provider]  VITAMIN D, CHOLECALCIFEROL, PO Take 1 tablet by mouth daily. One  daily    [provider]      Allergies    Demerol [meperidine], Oxycontin [oxycodone hcl], Fish allergy, Lavender oil, and Other    Review of Systems   Review of Systems  Gastrointestinal:  Positive for abdominal pain.  Neurological:  Positive for weakness.    Physical Exam Updated Vital Signs BP (!) 140/59 (BP Location: Right Arm)   Pulse (!) 105   Temp 97.8 F (36.6 C) (Oral)   Resp 17   Ht 5\' 4"  (1.626 m)   Wt 60 kg   SpO2 90%   BMI 22.71 kg/m  Physical Exam  ED Results / Procedures / Treatments   Labs (all labs ordered are listed, but only abnormal results are displayed) Labs Reviewed  CBC WITH DIFFERENTIAL/PLATELET - Abnormal; Notable for the following components:      Result Value   WBC 16.1 (*)    MCV 107.9 (*)    MCH 34.9 (*)    Neutro Abs 14.2 (*)    Lymphs Abs 0.6 (*)    Monocytes Absolute 1.2 (*)    All other components within normal limits  COMPREHENSIVE METABOLIC PANEL WITH GFR - Abnormal; Notable for the following components:   Glucose, Bld 134 (*)    BUN 24 (*)    Albumin 3.3 (*)    All other components within normal limits  LIPASE, BLOOD    EKG None  Radiology No results  found.  Procedures Procedures  {Document cardiac monitor, telemetry assessment procedure when appropriate:1}  Medications Ordered in ED Medications  sodium chloride 0.9 % bolus 1,000 mL (1,000 mLs Intravenous Bolus 07/06/23 1612)  ondansetron (ZOFRAN) injection 4 mg (4 mg Intravenous Given 07/06/23 1612)  ketorolac (TORADOL) 30 MG/ML injection 15 mg (15 mg Intravenous Given 07/06/23 1612)  piperacillin-tazobactam (ZOSYN) IVPB 3.375 g (0 g Intravenous Stopped 07/06/23 1846)  iohexol (OMNIPAQUE) 300 MG/ML solution 100 mL (100 mLs Intravenous Contrast Given 07/06/23 1824)    ED Course/ Medical Decision Making/ A&P  CT scan shows small bowel obstruction.  I spoke with Dr. Collene Dawson for general surgery and she requested us  to place an NG tube and to hold the patient's  Plavix.  Surgery will see the patient tomorrow {   Click here for ABCD2, HEART and other calculatorsREFRESH Note before signing :1}                              Medical Decision Making Amount and/or Complexity of Data Reviewed Labs: ordered. Radiology: ordered.  Risk Prescription drug management. Decision regarding hospitalization.   Small bowel obstruction, patient will be admitted to medicine and hydrated with general surgery consulting tomorrow  {Document critical care time when appropriate:1} {Document review of labs and clinical decision tools ie heart score, Chads2Vasc2 etc:1}  {Document your independent review of radiology images, and any outside records:1} {Document your discussion with family members, caretakers, and with consultants:1} {Document social determinants of health affecting pt's care:1} {Document your decision making why or why not admission, treatments were needed:1} Final Clinical Impression(s) / ED Diagnoses Final diagnoses:  Pain of upper abdomen    Rx / DC Orders ED Discharge Orders     None

## 2023-07-07 ENCOUNTER — Inpatient Hospital Stay (HOSPITAL_COMMUNITY)

## 2023-07-07 DIAGNOSIS — K56609 Unspecified intestinal obstruction, unspecified as to partial versus complete obstruction: Secondary | ICD-10-CM | POA: Diagnosis not present

## 2023-07-07 LAB — BASIC METABOLIC PANEL WITH GFR
Anion gap: 10 (ref 5–15)
BUN: 25 mg/dL — ABNORMAL HIGH (ref 8–23)
CO2: 26 mmol/L (ref 22–32)
Calcium: 8.4 mg/dL — ABNORMAL LOW (ref 8.9–10.3)
Chloride: 105 mmol/L (ref 98–111)
Creatinine, Ser: 0.68 mg/dL (ref 0.44–1.00)
GFR, Estimated: >60 mL/min (ref >60)
Glucose, Bld: 124 mg/dL — ABNORMAL HIGH (ref 70–99)
Potassium: 3.3 mmol/L — ABNORMAL LOW (ref 3.5–5.1)
Sodium: 141 mmol/L (ref 135–145)

## 2023-07-07 LAB — CBC
HCT: 41.2 % (ref 36.0–46.0)
Hemoglobin: 12.8 g/dL (ref 12.0–15.0)
MCH: 33.8 pg (ref 26.0–34.0)
MCHC: 31.1 g/dL (ref 30.0–36.0)
MCV: 108.7 fL — ABNORMAL HIGH (ref 80.0–100.0)
Platelets: 343 10*3/uL (ref 150–400)
RBC: 3.79 MIL/uL — ABNORMAL LOW (ref 3.87–5.11)
RDW: 13.2 % (ref 11.5–15.5)
WBC: 12.3 10*3/uL — ABNORMAL HIGH (ref 4.0–10.5)
nRBC: 0 % (ref 0.0–0.2)

## 2023-07-07 MED ORDER — LORAZEPAM 2 MG/ML IJ SOLN
0.5000 mg | Freq: Once | INTRAMUSCULAR | Status: AC | PRN
  Administered 2023-07-07: 0.5 mg via INTRAVENOUS
  Filled 2023-07-07: qty 1

## 2023-07-07 MED ORDER — KCL-LACTATED RINGERS-D5W 20 MEQ/L IV SOLN
INTRAVENOUS | Status: DC
  Filled 2023-07-07 (×2): qty 1000

## 2023-07-07 MED ORDER — POTASSIUM CHLORIDE 10 MEQ/100ML IV SOLN
10.0000 meq | INTRAVENOUS | Status: AC
  Administered 2023-07-07 (×3): 10 meq via INTRAVENOUS
  Filled 2023-07-07: qty 100

## 2023-07-07 NOTE — Consult Note (Addendum)
 [facility] Consult  Reason for Consult: SBO Referring Physician: Dr. Quintella Buck  Chief Complaint   Abdominal Pain; Nausea; Weakness     HPI: Desiree Barber is a 73 y.o. female with a SBO on CT scan. She has a history of COPD on PRN O2, HTN, PAD who is on plavix. She last took it about 4 days ago. She has been feeling poorly for the 3 days prior to admisison. She says she had nausea, dry heaving and that she had not had a BM for 3 days. She did have one this AM. She is still distended but improved. She has never had other SBO. She had a gallbladder that was distended with a stones but normal LFTs. This is likely from her NPO status prior to coming to the hospital.   Past Medical History:  Diagnosis Date   Anemia    Anxiety    Occasional   Arthritis    COPD (chronic obstructive pulmonary disease) (HCC)    HTN (hypertension)    Iron deficiency anemia due to chronic blood loss 03/28/2022   Peripheral arterial disease (HCC)     Past Surgical History:  Procedure Laterality Date   ABDOMINAL AORTOGRAM W/LOWER EXTREMITY N/A 04/10/2022   Procedure: ABDOMINAL AORTOGRAM W/LOWER EXTREMITY;  Surgeon: Margherita Shell, MD;  Location: [facility];  Service: Cardiovascular;  Laterality: N/A;   BIOPSY  08/27/2022   Procedure: BIOPSY;  Surgeon: Vinetta Greening, DO;  Location: [facility];  Service: Endoscopy;;   BIOPSY  03/25/2023   Procedure: BIOPSY;  Surgeon: Vinetta Greening, DO;  [facility]: [facility];  Service: Endoscopy;;   COLONOSCOPY  02/23/04   RMR: normal rectum and colon.minimal internal hemorrhoids   COLONOSCOPY WITH PROPOFOL N/A 06/13/2015   Procedure: COLONOSCOPY WITH PROPOFOL;  Surgeon: Suzette Espy, MD;  Location: [facility];  Service: Endoscopy;  Laterality: N/A;  1045   COLONOSCOPY WITH PROPOFOL N/A 08/27/2022   Procedure: COLONOSCOPY WITH PROPOFOL;  Surgeon: Vinetta Greening, DO;  [facility]: [facility];  Service: Endoscopy;  Laterality: N/A;   8:45 am, asa 3   ENTEROSCOPY N/A 03/25/2023   Procedure: ENTEROSCOPY;  Surgeon: Vinetta Greening, DO;  [facility]: [facility];  Service: Endoscopy;  Laterality: N/A;  115pm, asa 3   ESOPHAGOGASTRODUODENOSCOPY (EGD) WITH PROPOFOL N/A 08/27/2022   Procedure: ESOPHAGOGASTRODUODENOSCOPY (EGD) WITH PROPOFOL;  Surgeon: Vinetta Greening, DO;  [facility];  Service: Endoscopy;  Laterality: N/A;   GIVENS CAPSULE STUDY N/A 01/29/2023   Procedure: GIVENS CAPSULE STUDY;  Surgeon: Vinetta Greening, DO;  [facility];  Service: Endoscopy;  Laterality: N/A;  730am   HOT HEMOSTASIS  08/27/2022   Procedure: HOT HEMOSTASIS (ARGON PLASMA COAGULATION/BICAP);  Surgeon: Vinetta Greening, DO;  [facility];  Service: Endoscopy;;   HOT HEMOSTASIS  03/25/2023   Procedure: HOT HEMOSTASIS (ARGON PLASMA COAGULATION/BICAP);  Surgeon: Vinetta Greening, DO;  [facility];  Service: Endoscopy;;   POLYPECTOMY  06/13/2015   Procedure: POLYPECTOMY;  Surgeon: Suzette Espy, MD;  [facility];  Service: Endoscopy;;  sigmoid colon polyp   TOOTH EXTRACTION     TRIGGER FINGER RELEASE Bilateral    TUBAL LIGATION      Family History  Problem Relation Age of Onset   Colon cancer Neg Hx    Breast cancer Neg Hx     Social History   Tobacco Use   Smoking status: Former  Current packs/day: 0.00    Average packs/day: 0.5 packs/day for 55.0 years (27.5 ttl pk-yrs)    Types: Cigarettes    Start date: 05/30/1966    Quit date: 05/29/2021    Years since quitting: 2.1    Passive exposure: Past   Smokeless tobacco: Former   Tobacco comments:    Quit date 05/29/2021  Vaping Use   Vaping status: Never Used  Substance Use Topics   Alcohol use: Yes    Alcohol/week: 0.0 standard drinks of alcohol    Comment: 3 beers a day   Drug use: No    Medications: I have reviewed the patient's current medications. Prior to Admission:  Medications Prior to Admission  Medication Sig  Dispense Refill Last Dose/Taking   albuterol (PROVENTIL) (2.5 MG/3ML) 0.083% nebulizer solution Up to every 4 hours as needed (Patient taking differently: Take 2.5 mg by nebulization every 4 (four) hours as needed for wheezing or shortness of breath.) 75 mL 12 Taking Differently   albuterol (VENTOLIN HFA) 108 (90 Base) MCG/ACT inhaler Inhale 1-2 puffs into the lungs every 6 (six) hours as needed for shortness of breath or wheezing.   Taking As Needed   ALPRAZolam (XANAX) 0.25 MG tablet Take 0.25 mg by mouth daily as needed for anxiety or sleep.   Past Week   Ascorbic Acid (VITAMIN C) 100 MG tablet Take 100 mg by mouth daily.   Past Week   clopidogrel (PLAVIX) 75 MG tablet TAKE (1) TABLET BY MOUTH ONCE DAILY. (Patient taking differently: Take 75 mg by mouth daily.) 30 tablet 11 Past Week   ferrous sulfate 324 (65 Fe) MG TBEC Take 1 tablet (325 mg total) by mouth daily with breakfast. (Patient taking differently: Take 1 tablet by mouth 2 (two) times daily.) 30 tablet 11 Past Week   pantoprazole (PROTONIX) 40 MG tablet Take 1 tablet (40 mg total) by mouth 2 (two) times daily. 60 tablet 11 Past Week   umeclidinium-vilanterol (ANORO ELLIPTA) 62.5-25 MCG/ACT AEPB Inhale 1 puff into the lungs daily. 1 each 11 Past Week   vitamin B-12 (CYANOCOBALAMIN) 100 MCG tablet Take 100 mcg by mouth daily.   Past Week   VITAMIN D, CHOLECALCIFEROL, PO Take 1 tablet by mouth daily. One daily   Past Week   Scheduled:  umeclidinium-vilanterol  1 puff Inhalation Daily   Continuous:  dextrose 5% lactated ringers with KCl 20 mEq/L 75 mL/hr at 07/07/23 1142   piperacillin-tazobactam (ZOSYN)  IV Stopped (07/07/23 4098)   potassium chloride 10 mEq (07/07/23 1506)   JXB:JYNWGNFAOZHYQ **OR** acetaminophen, albuterol, morphine injection, ondansetron **OR** ondansetron (ZOFRAN) IV  Allergies  Allergen Reactions   Demerol [Meperidine] Nausea And Vomiting   Oxycontin [Oxycodone Hcl] Nausea And Vomiting   Fish Allergy Nausea  Only    Only fish from [place]   Lavender Oil Itching    And hives   Other Itching    Flower oils-gets hives too     ROS:  A comprehensive review of systems was negative except for: Gastrointestinal: positive for abdominal pain and nausea  Blood pressure (!) 142/63, pulse 91, temperature 98.4 F (36.9 C), temperature source Axillary, resp. rate 20, height 5\' 4"  (1.626 m), weight 60.4 kg, SpO2 99%. Physical Exam Vitals reviewed.  HENT:     Head: Normocephalic.  Cardiovascular:     Rate and Rhythm: Normal rate.  Pulmonary:     Effort: Pulmonary effort is normal.  Abdominal:     General: There is distension.  Palpations: Abdomen is soft.     Tenderness: There is generalized abdominal tenderness. There is no guarding or rebound.  Musculoskeletal:     Comments: Moves all extremities   Skin:    General: Skin is warm.  Neurological:     General: No focal deficit present.     Mental Status: She is alert and oriented to person, place, and time.  Psychiatric:        Mood and Affect: Mood normal.     Results: Results for orders placed or performed during the hospital encounter of 07/06/23 (from the past 48 hours)  CBC with Differential     Status: Abnormal   Collection Time: 07/06/23  4:22 PM  Result Value Ref Range   WBC 16.1 (H) 4.0 - 10.5 K/uL   RBC 4.07 3.87 - 5.11 MIL/uL   Hemoglobin 14.2 12.0 - 15.0 g/dL   HCT 16.1 09.6 - 04.5 %   MCV 107.9 (H) 80.0 - 100.0 fL   MCH 34.9 (H) 26.0 - 34.0 pg   MCHC 32.3 30.0 - 36.0 g/dL   RDW 40.9 81.1 - 91.4 %   Platelets 328 150 - 400 K/uL   nRBC 0.0 0.0 - 0.2 %   Neutrophils Relative % 87 %   Neutro Abs 14.2 (H) 1.7 - 7.7 K/uL   Lymphocytes Relative 4 %   Lymphs Abs 0.6 (L) 0.7 - 4.0 K/uL   Monocytes Relative 8 %   Monocytes Absolute 1.2 (H) 0.1 - 1.0 K/uL   Eosinophils Relative 0 %   Eosinophils Absolute 0.0 0.0 - 0.5 K/uL   Basophils Relative 1 %   Basophils Absolute 0.1 0.0 - 0.1 K/uL   Immature Granulocytes 0 %    Abs Immature Granulocytes 0.05 0.00 - 0.07 K/uL    Comment: Performed at [facility], [address] [place], [place] [address]  Comprehensive metabolic panel     Status: Abnormal   Collection Time: 07/06/23  4:22 PM  Result Value Ref Range   Sodium 140 135 - 145 mmol/L   Potassium 4.0 3.5 - 5.1 mmol/L   Chloride 101 98 - 111 mmol/L   CO2 28 22 - 32 mmol/L   Glucose, Bld 134 (H) 70 - 99 mg/dL    Comment: Glucose reference range applies only to samples taken after fasting for at least 8 hours.   BUN 24 (H) 8 - 23 mg/dL   Creatinine, Ser 6.21 0.44 - 1.00 mg/dL   Calcium 8.9 8.9 - 30.8 mg/dL   Total Protein 7.0 6.5 - 8.1 g/dL   Albumin 3.3 (L) 3.5 - 5.0 g/dL   AST 25 15 - 41 U/L   ALT 22 0 - 44 U/L   Alkaline Phosphatase 70 38 - 126 U/L   Total Bilirubin 1.1 0.0 - 1.2 mg/dL   GFR, Estimated >65 >78 mL/min    Comment: (NOTE) Calculated using the CKD-EPI Creatinine Equation (2021)    Anion gap 11 5 - 15    Comment: Performed at [facility], [address], [place], [place] [address]  Lipase, blood     Status: None   Collection Time: 07/06/23  4:22 PM  Result Value Ref Range   Lipase 24 11 - 51 U/L    Comment: Performed at [facility], [address] [place], [place] [address]  Lactic acid, plasma     Status: None   Collection Time: 07/06/23  7:42 PM  Result Value Ref Range   Lactic Acid, Venous 1.4 0.5 -  1.9 mmol/L    Comment: Performed at [facility], [address], [place], [place] [address]  Basic metabolic panel     Status: Abnormal   Collection Time: 07/07/23  5:50 AM  Result Value Ref Range   Sodium 141 135 - 145 mmol/L   Potassium 3.3 (L) 3.5 - 5.1 mmol/L   Chloride 105 98 - 111 mmol/L   CO2 26 22 - 32 mmol/L   Glucose, Bld 124 (H) 70 - 99 mg/dL    Comment: Glucose reference range applies only to samples taken after fasting for at least 8 hours.   BUN 25 (H) 8 - 23 mg/dL   Creatinine, Ser 6.04 0.44 - 1.00 mg/dL   Calcium 8.4 (L) 8.9 - 10.3 mg/dL   GFR,  Estimated >54 >09 mL/min    Comment: (NOTE) Calculated using the CKD-EPI Creatinine Equation (2021)    Anion gap 10 5 - 15    Comment: Performed at [facility], [address] [place], [place] [address]  CBC     Status: Abnormal   Collection Time: 07/07/23  5:50 AM  Result Value Ref Range   WBC 12.3 (H) 4.0 - 10.5 K/uL   RBC 3.79 (L) 3.87 - 5.11 MIL/uL   Hemoglobin 12.8 12.0 - 15.0 g/dL   HCT 47.8 29.5 - 62.1 %   MCV 108.7 (H) 80.0 - 100.0 fL   MCH 33.8 26.0 - 34.0 pg   MCHC 31.1 30.0 - 36.0 g/dL   RDW 30.8 65.7 - 84.6 %   Platelets 343 150 - 400 K/uL   nRBC 0.0 0.0 - 0.2 %    Comment: Performed at [facility], [address] [place], [place] [address]  Culture, blood (Routine X 2) w Reflex to ID Panel     Status: None (Preliminary result)   Collection Time: 07/07/23  5:50 AM   Specimen: BLOOD LEFT FOREARM  Result Value Ref Range   Specimen Description BLOOD LEFT FOREARM    Special Requests BOTTLES DRAWN AEROBIC AND ANAEROBIC    Culture      NO GROWTH < 12 HOURS Performed at [facility], [address] [place], [place] [address]    Report Status PENDING   Culture, blood (Routine X 2) w Reflex to ID Panel     Status: None (Preliminary result)   Collection Time: 07/07/23  5:56 AM   Specimen: BLOOD  Result Value Ref Range   Specimen Description BLOOD BLOOD LEFT HAND    Special Requests BOTTLES DRAWN AEROBIC AND ANAEROBIC    Culture      NO GROWTH < 12 HOURS Performed at [facility], [address], [place], [place] [address]    Report Status PENDING     Personally reviewed and showed family and patient- small bowel dilation, a gradual transition in the RLQ, some fecalized bowel, very minor edema in the mesentery, no wall thickening or pneumatosis   DG Abd Portable 1 View Result Date: 07/07/2023 CLINICAL DATA:  NG tube placement EXAM: PORTABLE ABDOMEN - 1 VIEW COMPARISON:  No comparison studies available. FINDINGS: NG tube tip is in the proximal stomach. Side port of the  NG tube is at or just below the GE junction. Diffuse gaseous distention of bowel loops evident. IMPRESSION: NG tube tip is in the stomach with proximal side port in the region of the GE junction. Advancement of the NG tube by 3-4 cm could ensure placement of the side port below the GE junction as clinically warranted. Electronically Signed  By: Donnal Fusi M.D.   On: 07/07/2023 10:52   DG Chest Portable 1 View Result Date: 07/06/2023 CLINICAL DATA:  Check gastric catheter placement EXAM: PORTABLE CHEST 1 VIEW COMPARISON:  12/23/2021 FINDINGS: Cardiac shadow is stable. Aortic calcifications are noted. Gastric catheter extends into the stomach. IMPRESSION: Gastric catheter within the stomach. Electronically Signed   By: Violeta Grey M.D.   On: 07/06/2023 20:44   CT ABDOMEN PELVIS W CONTRAST Result Date: 07/06/2023 CLINICAL DATA:  Acute abdominal pain EXAM: CT ABDOMEN AND PELVIS WITH CONTRAST TECHNIQUE: Multidetector CT imaging of the abdomen and pelvis was performed using the standard protocol following bolus administration of intravenous contrast. RADIATION DOSE REDUCTION: This exam was performed according to the departmental dose-optimization program which includes automated exposure control, adjustment of the mA and/or kV according to patient size and/or use of iterative reconstruction technique. CONTRAST:  100mL OMNIPAQUE IOHEXOL 300 MG/ML  SOLN COMPARISON:  CT abdomen and pelvis 07/15/2008. FINDINGS: Lower chest: There are tree-in-bud opacities in the right middle lobe, likely infectious/inflammatory. There is a ground-glass nodule in the left lower lobe measuring 5 mm image 3/11. Hepatobiliary: There is a hypodensity in the posterior right lobe of the liver measuring 12 mm which is not seen on prior measuring 55 Hounsfield units, indeterminate. A gallstone is present measuring 3 cm. The gallbladder is distended. There is no biliary ductal dilatation. Pancreas: Unremarkable. No pancreatic ductal  dilatation or surrounding inflammatory changes. Spleen: There is a calcified granuloma in the spleen. Spleen is otherwise within normal limits. Adrenals/Urinary Tract: Adrenal glands are unremarkable. Kidneys are normal, without renal calculi, focal lesion, or hydronephrosis. Bladder is unremarkable. Stomach/Bowel: There are mildly dilated small bowel loops throughout the abdomen and pelvis with some associated mesenteric edema and air-fluid levels. Transition point is seen in the distal ileum in the right lower quadrant. No pneumatosis or free air. Colon is nondilated. Stomach is moderately distended. There is a small hiatal hernia. The appendix is not visualized. Vascular/Lymphatic: Aortic atherosclerosis. No enlarged abdominal or pelvic lymph nodes. Reproductive: Uterus and bilateral adnexa are unremarkable. Other: No abdominal wall hernia or abnormality. No abdominopelvic ascites. Musculoskeletal: There are degenerative changes of the lumbar spine. IMPRESSION: 1. Small-bowel obstruction with transition point in the distal ileum in the right lower quadrant. Mesenteric edema is present. No pneumatosis or free air. 2. Cholelithiasis with distended gallbladder. 3. Indeterminate 12 mm hypodensity in the right lobe of the liver. Recommend further evaluation with ultrasound or MRI. 4. Tree-in-bud opacities in the right middle lobe, likely infectious/inflammatory. 5. 5 mm ground-glass nodule in the left lower lobe. No follow-up recommended. This recommendation follows the consensus statement: Guidelines for Management of Incidental Pulmonary Nodules Detected on CT Images: From the Fleischner Society 2017; Radiology 2017; 284:228-243. 6. Aortic atherosclerosis. Electronically Signed   By: Tyron Gallon M.D.   On: 07/06/2023 19:03     Assessment & Plan:  MUNACHIMSO RIGDON is a 73 y.o. female with an SBO. She had NG and is NPO. She had a BM but is still very distended. Lab work reassuring   -NPO, NG in place -Will  plan for SBFT in the AM unless she opens up and has more Bms  -Discussed potential need for surgery but hopefully can resolve without surgery -Ambulate in the hall off suction -Gallstones and some gallbladder distention on CT- I think this is related to her poor intake prior to the CT   All questions were answered to the satisfaction of the patient and family.  Desiree Barber 07/07/2023, 3:07 PM

## 2023-07-07 NOTE — Progress Notes (Signed)
  Progress Note   Patient: Desiree Barber ZOX:[id number] DOB: 1950/08/28 DOA: 07/06/2023     1 DOS: the patient was seen and examined on 07/07/2023  Assessment and Plan: * SBO (small bowel obstruction) (HCC) - NPO  - IV D5-LR w/ 20K+ @ 75 cc/hr - IV morphine 2 mg q4 hr PRN - Zofran PRN - NG tube replaced this morning 07/07/2023 - Appreciate surgery following up   SIRS (systemic inflammatory response syndrome) (HCC) - IV zosyn 3.375 g q8hr   PAD (peripheral artery disease) (HCC) - Plavix on hold per surgery   COPD (chronic obstructive pulmonary disease) (HCC) - Albuterol q4 hr PRN  - Anoro ellipta 1 puff daily   Liver lesion - Incidental finding on CT- Indeterminate 12 mm hypodensity in the right lobe of the liver. Recommend further evaluation with ultrasound or MRI. - Needs follow-up  Chronic respiratory failure with hypoxia (HCC) On 2 L as needed.  Has not required it for quite a while.  Currently on room air sats 93 to 100%.  HTN (hypertension) Stable.  Not on medications.  Subjective: Pt seen and examined at the bedside. K+ was low this morning and replacement was ordered. NG tube replaced this morning as well. Awaiting follow up from surgery today.  Physical Exam: Vitals:   07/06/23 2150 07/07/23 0100 07/07/23 0612 07/07/23 1128  BP: (!) 148/67 (!) 124/55 (!) 124/40 136/72  Pulse: (!) 108 (!) 106 (!) 103 87  Resp: (!) 21   20  Temp: (!) 97.5 F (36.4 C) 98.5 F (36.9 C) 99.4 F (37.4 C) 97.6 F (36.4 C)  TempSrc: Oral Oral Oral Axillary  SpO2: 96% 95% 96% 100%  Weight: 60.4 kg     Height:       Physical Exam HENT:     Head: Normocephalic.  Cardiovascular:     Rate and Rhythm: Tachycardia present.  Pulmonary:     Effort: Pulmonary effort is normal.  Abdominal:     General: There is distension.  Skin:    General: Skin is warm.  Neurological:     Mental Status: She is alert and oriented to person, place, and time.  Psychiatric:        Mood and Affect:  Mood normal.     Disposition: Status is: Inpatient Remains inpatient appropriate because: NG tube, IV fluids, electrolyte replacement   Planned Discharge Destination: Barriers to discharge: SBO management requires inpatient care    Time spent: 35 minutes  Author: Edrei Norgaard , MD 07/07/2023 11:39 AM  For on call review [email or web address].

## 2023-07-07 NOTE — Plan of Care (Signed)

## 2023-07-07 NOTE — Progress Notes (Signed)
   07/07/23 1618  TOC Brief Assessment  Insurance and Status Reviewed  Patient has primary care physician Yes  Home environment has been reviewed Single family home  Prior level of function: Independent  Prior/Current Home Services No current home services  Readmission risk has been reviewed Yes  Transition of care needs no transition of care needs at this time   Transition of Care Department [facility]) has reviewed patient and no TOC needs have been identified at this time. We will continue to monitor patient advancement through interdisciplinary progression rounds. If new patient transition needs arise, please place a TOC consult

## 2023-07-08 ENCOUNTER — Inpatient Hospital Stay (HOSPITAL_COMMUNITY)

## 2023-07-08 DIAGNOSIS — K56609 Unspecified intestinal obstruction, unspecified as to partial versus complete obstruction: Secondary | ICD-10-CM | POA: Diagnosis not present

## 2023-07-08 LAB — COMPREHENSIVE METABOLIC PANEL WITH GFR
ALT: 15 U/L (ref 0–44)
ALT: 852 U/L — ABNORMAL HIGH (ref 0–44)
ALT: 1819 U/L — ABNORMAL HIGH (ref 0–44)
AST: 14 U/L — ABNORMAL LOW (ref 15–41)
AST: 1074 U/L — ABNORMAL HIGH (ref 15–41)
AST: 2912 U/L — ABNORMAL HIGH (ref 15–41)
Albumin: 2.7 g/dL — ABNORMAL LOW (ref 3.5–5.0)
Albumin: 3 g/dL — ABNORMAL LOW (ref 3.5–5.0)
Albumin: 3 g/dL — ABNORMAL LOW (ref 3.5–5.0)
Alkaline Phosphatase: 64 U/L (ref 38–126)
Alkaline Phosphatase: 153 U/L — ABNORMAL HIGH (ref 38–126)
Alkaline Phosphatase: 149 U/L — ABNORMAL HIGH (ref 38–126)
Anion gap: 7 (ref 5–15)
Anion gap: 14 (ref 5–15)
Anion gap: 14 (ref 5–15)
BUN: 15 mg/dL (ref 8–23)
BUN: 26 mg/dL — ABNORMAL HIGH (ref 8–23)
BUN: 30 mg/dL — ABNORMAL HIGH (ref 8–23)
CO2: 30 mmol/L (ref 22–32)
CO2: 26 mmol/L (ref 22–32)
CO2: 25 mmol/L (ref 22–32)
Calcium: 8.5 mg/dL — ABNORMAL LOW (ref 8.9–10.3)
Calcium: 8.9 mg/dL (ref 8.9–10.3)
Calcium: 8.7 mg/dL — ABNORMAL LOW (ref 8.9–10.3)
Chloride: 105 mmol/L (ref 98–111)
Chloride: 103 mmol/L (ref 98–111)
Chloride: 104 mmol/L (ref 98–111)
Creatinine, Ser: 0.59 mg/dL (ref 0.44–1.00)
Creatinine, Ser: 1.31 mg/dL — ABNORMAL HIGH (ref 0.44–1.00)
Creatinine, Ser: 1.31 mg/dL — ABNORMAL HIGH (ref 0.44–1.00)
GFR, Estimated: >60 mL/min (ref >60)
GFR, Estimated: 43 mL/min — ABNORMAL LOW (ref >60)
GFR, Estimated: 43 mL/min — ABNORMAL LOW (ref >60)
Glucose, Bld: 108 mg/dL — ABNORMAL HIGH (ref 70–99)
Glucose, Bld: 144 mg/dL — ABNORMAL HIGH (ref 70–99)
Glucose, Bld: 154 mg/dL — ABNORMAL HIGH (ref 70–99)
Potassium: 3.9 mmol/L (ref 3.5–5.1)
Potassium: 4 mmol/L (ref 3.5–5.1)
Potassium: 3.8 mmol/L (ref 3.5–5.1)
Sodium: 142 mmol/L (ref 135–145)
Sodium: 143 mmol/L (ref 135–145)
Sodium: 143 mmol/L (ref 135–145)
Total Bilirubin: 0.9 mg/dL (ref 0.0–1.2)
Total Bilirubin: 1.1 mg/dL (ref 0.0–1.2)
Total Bilirubin: 1 mg/dL (ref 0.0–1.2)
Total Protein: 6 g/dL — ABNORMAL LOW (ref 6.5–8.1)
Total Protein: 6.8 g/dL (ref 6.5–8.1)
Total Protein: 6.6 g/dL (ref 6.5–8.1)

## 2023-07-08 LAB — CBC
HCT: 39.9 % (ref 36.0–46.0)
HCT: 42.4 % (ref 36.0–46.0)
Hemoglobin: 12.1 g/dL (ref 12.0–15.0)
Hemoglobin: 13.1 g/dL (ref 12.0–15.0)
MCH: 33.6 pg (ref 26.0–34.0)
MCH: 34.6 pg — ABNORMAL HIGH (ref 26.0–34.0)
MCHC: 30.3 g/dL (ref 30.0–36.0)
MCHC: 30.9 g/dL (ref 30.0–36.0)
MCV: 110.8 fL — ABNORMAL HIGH (ref 80.0–100.0)
MCV: 111.9 fL — ABNORMAL HIGH (ref 80.0–100.0)
Platelets: 317 10*3/uL (ref 150–400)
Platelets: 385 10*3/uL (ref 150–400)
RBC: 3.6 MIL/uL — ABNORMAL LOW (ref 3.87–5.11)
RBC: 3.79 MIL/uL — ABNORMAL LOW (ref 3.87–5.11)
RDW: 12.9 % (ref 11.5–15.5)
RDW: 13.3 % (ref 11.5–15.5)
WBC: 9.5 10*3/uL (ref 4.0–10.5)
WBC: 10.3 10*3/uL (ref 4.0–10.5)
nRBC: 0 % (ref 0.0–0.2)
nRBC: 0 % (ref 0.0–0.2)

## 2023-07-08 LAB — BLOOD GAS, ARTERIAL
Acid-Base Excess: 1.6 mmol/L (ref 0.0–2.0)
Acid-Base Excess: 5.3 mmol/L — ABNORMAL HIGH (ref 0.0–2.0)
Bicarbonate: 28.2 mmol/L — ABNORMAL HIGH (ref 20.0–28.0)
Bicarbonate: 31.9 mmol/L — ABNORMAL HIGH (ref 20.0–28.0)
Drawn by: 41977
Drawn by: 28340
O2 Saturation: 97.1 %
O2 Saturation: 97.5 %
Patient temperature: 37.1
Patient temperature: 37
pCO2 arterial: 51 mmHg — ABNORMAL HIGH (ref 32–48)
pCO2 arterial: 54 mmHg — ABNORMAL HIGH (ref 32–48)
pH, Arterial: 7.35 (ref 7.35–7.45)
pH, Arterial: 7.38 (ref 7.35–7.45)
pO2, Arterial: 79 mmHg — ABNORMAL LOW (ref 83–108)
pO2, Arterial: 79 mmHg — ABNORMAL LOW (ref 83–108)

## 2023-07-08 LAB — PROTIME-INR
INR: 1 (ref 0.8–1.2)
INR: 1.4 — ABNORMAL HIGH (ref 0.8–1.2)
Prothrombin Time: 13.4 s (ref 11.4–15.2)
Prothrombin Time: 17 s — ABNORMAL HIGH (ref 11.4–15.2)

## 2023-07-08 LAB — LACTIC ACID, PLASMA
Lactic Acid, Venous: 1.5 mmol/L (ref 0.5–1.9)
Lactic Acid, Venous: 1.2 mmol/L (ref 0.5–1.9)

## 2023-07-08 LAB — BRAIN NATRIURETIC PEPTIDE: B Natriuretic Peptide: 236 pg/mL — ABNORMAL HIGH (ref 0.0–100.0)

## 2023-07-08 LAB — PHOSPHORUS: Phosphorus: 2.3 mg/dL — ABNORMAL LOW (ref 2.5–4.6)

## 2023-07-08 LAB — MAGNESIUM: Magnesium: 2.1 mg/dL (ref 1.7–2.4)

## 2023-07-08 MED ORDER — KCL-LACTATED RINGERS-D5W 20 MEQ/L IV SOLN
INTRAVENOUS | Status: DC
  Filled 2023-07-08: qty 1000

## 2023-07-08 MED ORDER — METHYLPREDNISOLONE SODIUM SUCC 125 MG IJ SOLR
125.0000 mg | Freq: Once | INTRAMUSCULAR | Status: AC
  Administered 2023-07-08: 125 mg via INTRAVENOUS
  Filled 2023-07-08: qty 2

## 2023-07-08 MED ORDER — LACTATED RINGERS IV BOLUS
500.0000 mL | Freq: Once | INTRAVENOUS | Status: AC
  Administered 2023-07-08: 500 mL via INTRAVENOUS

## 2023-07-08 MED ORDER — IPRATROPIUM BROMIDE 0.02 % IN SOLN
0.5000 mg | Freq: Four times a day (QID) | RESPIRATORY_TRACT | Status: DC
  Administered 2023-07-08 (×2): 0.5 mg via RESPIRATORY_TRACT
  Filled 2023-07-08 (×2): qty 2.5

## 2023-07-08 MED ORDER — SENNOSIDES 8.8 MG/5ML PO SYRP
10.0000 mL | ORAL_SOLUTION | Freq: Two times a day (BID) | ORAL | Status: DC
  Administered 2023-07-08: 10 mL via ORAL
  Filled 2023-07-08 (×3): qty 10

## 2023-07-08 MED ORDER — LEVALBUTEROL HCL 1.25 MG/0.5ML IN NEBU
1.2500 mg | INHALATION_SOLUTION | Freq: Four times a day (QID) | RESPIRATORY_TRACT | Status: DC
  Administered 2023-07-08 (×2): 1.25 mg via RESPIRATORY_TRACT
  Filled 2023-07-08 (×4): qty 0.5

## 2023-07-08 MED ORDER — FUROSEMIDE 10 MG/ML IJ SOLN
40.0000 mg | Freq: Once | INTRAMUSCULAR | Status: AC
  Administered 2023-07-08: 40 mg via INTRAVENOUS
  Filled 2023-07-08: qty 4

## 2023-07-08 MED ORDER — SENNOSIDES 8.8 MG/5ML PO SYRP
10.0000 mL | ORAL_SOLUTION | Freq: Two times a day (BID) | ORAL | Status: DC
  Administered 2023-07-09 – 2023-07-11 (×6): 10 mL
  Filled 2023-07-08 (×8): qty 10

## 2023-07-08 MED ORDER — IOHEXOL 350 MG/ML SOLN
50.0000 mL | Freq: Once | INTRAVENOUS | Status: AC | PRN
  Administered 2023-07-08: 50 mL via INTRAVENOUS

## 2023-07-08 MED ORDER — DIATRIZOATE MEGLUMINE & SODIUM 66-10 % PO SOLN
90.0000 mL | Freq: Once | ORAL | Status: DC
  Filled 2023-07-08: qty 90

## 2023-07-08 MED ORDER — IOHEXOL 350 MG/ML SOLN
75.0000 mL | Freq: Once | INTRAVENOUS | Status: AC | PRN
  Administered 2023-07-08: 75 mL via INTRAVENOUS

## 2023-07-08 MED ORDER — LORAZEPAM 2 MG/ML IJ SOLN
1.0000 mg | Freq: Once | INTRAMUSCULAR | Status: AC
  Administered 2023-07-08: 1 mg via INTRAVENOUS
  Filled 2023-07-08: qty 1

## 2023-07-08 MED ORDER — LORAZEPAM 2 MG/ML IJ SOLN
0.5000 mg | Freq: Four times a day (QID) | INTRAMUSCULAR | Status: AC | PRN
  Administered 2023-07-12: 0.5 mg via INTRAVENOUS
  Filled 2023-07-08: qty 1

## 2023-07-08 MED ORDER — DIATRIZOATE MEGLUMINE & SODIUM 66-10 % PO SOLN
90.0000 mL | Freq: Once | ORAL | Status: AC
  Administered 2023-07-08: 90 mL via NASOGASTRIC
  Filled 2023-07-08: qty 90

## 2023-07-08 MED ORDER — HEPARIN SODIUM (PORCINE) 5000 UNIT/ML IJ SOLN
5000.0000 [IU] | Freq: Three times a day (TID) | INTRAMUSCULAR | Status: DC
  Administered 2023-07-08 – 2023-07-14 (×18): 5000 [IU] via SUBCUTANEOUS
  Filled 2023-07-08 (×19): qty 1

## 2023-07-08 MED ORDER — SODIUM CHLORIDE 0.45 % IV SOLN: INTRAVENOUS | Status: DC

## 2023-07-08 MED ORDER — ORAL CARE MOUTH RINSE: 15.0000 mL | OROMUCOSAL | Status: DC | PRN

## 2023-07-08 MED ORDER — GADOBUTROL 1 MMOL/ML IV SOLN
6.0000 mL | Freq: Once | INTRAVENOUS | Status: AC | PRN
  Administered 2023-07-08: 6 mL via INTRAVENOUS

## 2023-07-08 NOTE — Progress Notes (Signed)
~  1500: Pt found to be confused and lethargic with cyanosis around the lips. Pt had labored breathing and tachypnea breathing 30bpm. Pt satting <80% on room air and tachycardia in 140's. Pt placed on O2 5L at onset and titrated to 3L. At this time patient became more alert and satting 99% with HR in 130's. Amber Bail, MD notified.

## 2023-07-08 NOTE — Progress Notes (Signed)
  Called to the bedside, as to increased work of breathing, Patient was seen and examined, awake, complaining of shortness of breath denies any chest pain  O2 demand up to 3 L, currently satting 99%, dry heaving   Blood pressure 128/83, pulse (!) 132, temperature 98.4 F (36.9 C), temperature source Oral, resp. rate 20, height 5\' 4"  (1.626 m), weight 60.4 kg, SpO2 99%.  Acute respiratory failure-underlying history of COPD, abdominal distention due to SBO -Currently on 3 L of oxygen, satting 99% -Getting labs: CMP, CBC, BNP, lactic acid, -stat ABG, chest x-ray,  Treating with IV Lasix 40 mg x 1, IV Solu-Medrol 125 mg, Xopenex/Atrovent nebs -Discussed breathing treatment with the patient respiratory therapist, husband at bedside  Tachycardic, mildly tachypneic satting 99% on 3 L - Loosely after reviewing above findings we will evaluate follow-up, for possibly 2 obtain CTA of the chest to rule out PE   Initiating DVT prophylaxis-heparin although patient is pending surgery Will discuss with general surgery team   SIGNED: Bobbetta Burnet, MD, FHM. FAAFP Critical care time 35 minutes spent in seeing evaluating patient, reviewing current labs, medical records, ordering labs, medications drawing treatment plan  [facility],  Pager (please use [email or web address] to page/text)  Please use Epic Secure Chat for non-urgent communication (7AM-7PM) If 7PM-7AM, please contact night-coverage [email or web address],  07/08/2023, 3:24 PM

## 2023-07-08 NOTE — Progress Notes (Signed)
  Called to the bedside, due to change in mental status.  On evaluation patient was alert and oriented x 3 without any focal deficits.  Family stated that she was confused and hallucinating.  Her vitals were stable and she was satting well on nasal cannula.  Recent LFTs were obtained which demonstrated new transaminitis.  There is no evidence of lactic acidosis.  Due to elevated LFTs and altered mental status I ordered a CT head and CT venogram of abdomen due to concern for portal vein thrombus.  CT imaging demonstrated concern for cholecystitis per my read.  Not officially read by radiology.  I transferred her to [facility] per family request and likely need for percutaneous cholecystostomy tube versus further surgical evaluation.  Family amenable and appreciative.  Vitals:   07/08/23 2005 07/08/23 2044  BP: 136/60 137/82  Pulse:    Resp:    Temp: 99.3 F (37.4 C) (!) 100.4 F (38 C)  SpO2:      # Acute infectious/metabolic encephalopathy most likely secondary to acute cholecystitis - Profound elevations in LFTs sudden onset - Patient's altered, negative Murphy sign -CT with possible cholecystitis but would need final radiology read  Plan: Follow-up CT abdomen upon arrival to [facility] for further evaluation  # Small bowel obstruction-patient has NG tube and was followed by surgery.  Will need to continue evaluation of her small bowel obstruction    SIGNED: Myrl Askew, MD,  Critical care time 75 minutes spent in seeing evaluating patient, reviewing current labs, medical records, ordering labs, medications drawing treatment plan  [facility],  Pager (please use [email or web address] to page/text)  Please use Epic Secure Chat for non-urgent communication (7AM-7PM) If 7PM-7AM, please contact night-coverage [email or web address],  07/08/2023, 10:45 PM

## 2023-07-08 NOTE — Plan of Care (Signed)
   Problem: Education: Goal: Knowledge of General Education information will improve Description: Including pain rating scale, medication(s)/side effects and non-pharmacologic comfort measures Outcome: Progressing   Problem: Health Behavior/Discharge Planning: Goal: Ability to manage health-related needs will improve Outcome: Progressing   Problem: Clinical Measurements: Goal: Will remain free from infection Outcome: Progressing

## 2023-07-08 NOTE — Hospital Course (Addendum)
  Desiree Barber is a 73 y.o. female with medical history significant for chronic respiratory failure on as needed O2, COPD, hypertension, peripheral artery disease. Patient presented to the ED with complaints of abdominal pain with distention of 3 days duration.  She reports nausea, dry heaving but no real vomiting.  She thinks her last bowel movement was 3 days ago.  She denies prior episodes of small bowel obstruction, she denies prior abdominal surgeries.   ED Course: Heart rate 95-105.  Temperature 97.8.  Sats 93 to 100% on room air. WBC 16.1. CT abdomen and pelvis W contrast-small bowel obstruction with transition point in the distal ileum in the right lower quadrant.  Mesenteric edema present.  No pneumatosis or free air.  Cholelithiasis with distended gallbladder. EDP talked to general surgeon Dr. Collene Dawson, will see in consult in a.m., NG tube, p.o., IV fluids, hold Plavix.    Assessment & Plan:   Principal Problem:   SBO (small bowel obstruction) (HCC) Active Problems:   SIRS (systemic inflammatory response syndrome) (HCC)   HTN (hypertension)   Chronic respiratory failure with hypoxia (HCC)   Liver lesion   COPD (chronic obstructive pulmonary disease) (HCC)   PAD (peripheral artery disease) (HCC)   Acute respiratory failure-underlying history of COPD, abdominal distention due to SBO -Currently on 3 L of oxygen, satting 99% -Getting labs: CMP, CBC, BNP, lactic acid, -stat ABG, chest x-ray,   Treating with IV Lasix 40 mg x 1, IV Solu-Medrol 125 mg, Xopenex/Atrovent nebs -Discussed breathing treatment with the patient respiratory therapist, husband at bedside   Tachycardic, mildly tachypneic satting 99% on 3 L - Loosely after reviewing above findings we will evaluate follow-up, for possibly 2 obtain CTA of the chest to rule out PE     Initiating DVT prophylaxis-heparin although patient is pending surgery    Assessment and Plan: * SBO (small bowel obstruction)  (HCC) -Reports she may had some gas but no bowel movements - NPO -NG tube in place - IV D5-LR w/ 20K+ @ 75 cc/hr - IV morphine 2 mg q4 hr PRN - Zofran PRN - NG tube replaced  07/07/2023 - Appreciate surgery following up   SIRS (systemic inflammatory response syndrome) (HCC) -Improved SIRS physiology - IV zosyn 3.375 g q8hr   PAD (peripheral artery disease) (HCC) - Plavix on hold per surgery   COPD (chronic obstructive pulmonary disease) (HCC) -Stable on room air satting 98% - Albuterol q4 hr PRN  - Anoro ellipta 1 puff daily   Liver lesion - Incidental finding on CT- Indeterminate 12 mm hypodensity in the right lobe of the liver. Recommend further evaluation with ultrasound or MRI. -Will obtain MRI of the abdomen - Needs follow-up  Chronic respiratory failure with hypoxia (HCC)  -Currently stable on room air On 2 L as needed.  Has not required it for quite a while.   HTN (hypertension) Stable.  Not on medications.

## 2023-07-08 NOTE — Progress Notes (Signed)
 [facility] Progress Note     Subjective: Doing fair but very distended. No bowel function. SBFT started.   Objective: Vital signs in last 24 hours: Temp:  [97.6 F (36.4 C)-98.4 F (36.9 C)] 98.4 F (36.9 C) (04/14 0626) Pulse Rate:  [87-102] 102 (04/14 0626) Resp:  [20] 20 (04/13 1128) BP: (136-150)/(63-73) 145/71 (04/14 0626) SpO2:  [97 %-100 %] 98 % (04/14 0732) Last BM Date : 07/07/23  Intake/Output from previous day: 04/13 0701 - 04/14 0700 In: 910.7 [I.V.:589.1; IV Piggyback:321.7] Out: -  Intake/Output this shift: Total I/O In: -  Out: 900 [Emesis/NG output:900]  General appearance: alert and no distress Resp: normal work of breathing GI: soft, distended, mildly tender  Lab Results:  Recent Labs    07/07/23 0550 07/08/23 0545  WBC 12.3* 9.5  HGB 12.8 12.1  HCT 41.2 39.9  PLT 343 317   BMET Recent Labs    07/07/23 0550 07/08/23 0545  NA 141 142  K 3.3* 3.9  CL 105 105  CO2 26 30  GLUCOSE 124* 108*  BUN 25* 15  CREATININE 0.68 0.59  CALCIUM 8.4* 8.5*   PT/INR Recent Labs    07/08/23 0545  LABPROT 13.4  INR 1.0    Studies/Results: DG Abd Portable 1V-Small Bowel Protocol-Position Verification Result Date: 07/08/2023 CLINICAL DATA:  Nasogastric tube placement EXAM: PORTABLE ABDOMEN - 1 VIEW COMPARISON:  the previous day's study FINDINGS: Gastric tube is in the decompressed stomach, stable. There are a few gas dilated small bowel loops in the right mid abdomen, decreased in number since previous study. Colon appears decompressed. Multilevel spondylitic changes in the lumbar spine. IMPRESSION: 1. Gastric tube in decompressed stomach. 2. Persistent small bowel dilatation. Electronically Signed   By: Nicoletta Barrier M.D.   On: 07/08/2023 08:26   DG Abd Portable 1 View Result Date: 07/07/2023 CLINICAL DATA:  NG tube placement EXAM: PORTABLE ABDOMEN - 1 VIEW COMPARISON:  No comparison studies available. FINDINGS: NG tube tip is in the  proximal stomach. Side port of the NG tube is at or just below the GE junction. Diffuse gaseous distention of bowel loops evident. IMPRESSION: NG tube tip is in the stomach with proximal side port in the region of the GE junction. Advancement of the NG tube by 3-4 cm could ensure placement of the side port below the GE junction as clinically warranted. Electronically Signed   By: Donnal Fusi M.D.   On: 07/07/2023 10:52   DG Chest Portable 1 View Result Date: 07/06/2023 CLINICAL DATA:  Check gastric catheter placement EXAM: PORTABLE CHEST 1 VIEW COMPARISON:  12/23/2021 FINDINGS: Cardiac shadow is stable. Aortic calcifications are noted. Gastric catheter extends into the stomach. IMPRESSION: Gastric catheter within the stomach. Electronically Signed   By: Violeta Grey M.D.   On: 07/06/2023 20:44   CT ABDOMEN PELVIS W CONTRAST Result Date: 07/06/2023 CLINICAL DATA:  Acute abdominal pain EXAM: CT ABDOMEN AND PELVIS WITH CONTRAST TECHNIQUE: Multidetector CT imaging of the abdomen and pelvis was performed using the standard protocol following bolus administration of intravenous contrast. RADIATION DOSE REDUCTION: This exam was performed according to the departmental dose-optimization program which includes automated exposure control, adjustment of the mA and/or kV according to patient size and/or use of iterative reconstruction technique. CONTRAST:  100mL OMNIPAQUE IOHEXOL 300 MG/ML  SOLN COMPARISON:  CT abdomen and pelvis 07/15/2008. FINDINGS: Lower chest: There are tree-in-bud opacities in the right middle lobe, likely infectious/inflammatory. There is a ground-glass nodule in the left lower  lobe measuring 5 mm image 3/11. Hepatobiliary: There is a hypodensity in the posterior right lobe of the liver measuring 12 mm which is not seen on prior measuring 55 Hounsfield units, indeterminate. A gallstone is present measuring 3 cm. The gallbladder is distended. There is no biliary ductal dilatation. Pancreas:  Unremarkable. No pancreatic ductal dilatation or surrounding inflammatory changes. Spleen: There is a calcified granuloma in the spleen. Spleen is otherwise within normal limits. Adrenals/Urinary Tract: Adrenal glands are unremarkable. Kidneys are normal, without renal calculi, focal lesion, or hydronephrosis. Bladder is unremarkable. Stomach/Bowel: There are mildly dilated small bowel loops throughout the abdomen and pelvis with some associated mesenteric edema and air-fluid levels. Transition point is seen in the distal ileum in the right lower quadrant. No pneumatosis or free air. Colon is nondilated. Stomach is moderately distended. There is a small hiatal hernia. The appendix is not visualized. Vascular/Lymphatic: Aortic atherosclerosis. No enlarged abdominal or pelvic lymph nodes. Reproductive: Uterus and bilateral adnexa are unremarkable. Other: No abdominal wall hernia or abnormality. No abdominopelvic ascites. Musculoskeletal: There are degenerative changes of the lumbar spine. IMPRESSION: 1. Small-bowel obstruction with transition point in the distal ileum in the right lower quadrant. Mesenteric edema is present. No pneumatosis or free air. 2. Cholelithiasis with distended gallbladder. 3. Indeterminate 12 mm hypodensity in the right lobe of the liver. Recommend further evaluation with ultrasound or MRI. 4. Tree-in-bud opacities in the right middle lobe, likely infectious/inflammatory. 5. 5 mm ground-glass nodule in the left lower lobe. No follow-up recommended. This recommendation follows the consensus statement: Guidelines for Management of Incidental Pulmonary Nodules Detected on CT Images: From the Fleischner Society 2017; Radiology 2017; 284:228-243. 6. Aortic atherosclerosis. Electronically Signed   By: Tyron Gallon M.D.   On: 07/06/2023 19:03    Anti-infectives: Anti-infectives (From admission, onward)    Start     Dose/Rate Route Frequency Ordered Stop   07/07/23 0200   piperacillin-tazobactam (ZOSYN) IVPB 3.375 g        3.375 g 12.5 mL/hr over 240 Minutes Intravenous Every 8 hours 07/06/23 2132     07/06/23 1730  piperacillin-tazobactam (ZOSYN) IVPB 3.375 g        3.375 g 100 mL/hr over 30 Minutes Intravenous  Once 07/06/23 1715 07/06/23 1846       Assessment/Plan: Patient with SBO. SBFT today. Dr. Larrie Po will follow up. Discussed with patient and family if does not resolve, she may need surgery tomorrow.     LOS: 2 days    Desiree Barber 07/08/2023

## 2023-07-08 NOTE — Progress Notes (Signed)
 PROGRESS NOTE    Patient: Desiree Barber                            PCP: Assunta Found, MD                    DOB: [birth year]            DOA: 07/06/2023 NWG:[id number]             DOS: 07/08/2023, 11:00 AM   LOS: 2 days   Date of Service: The patient was seen and examined on 07/08/2023  Subjective:   The patient was seen and examined this morning. Hemodynamically stable.  Mild tachycardia cardiac, hypertensive otherwise satting 98% room air Reporting improved abdominal pain some gas but no bowel movements  No issues overnight .  Brief Narrative:    Desiree Barber is a 74 y.o. female with medical history significant for chronic respiratory failure on as needed O2, COPD, hypertension, peripheral artery disease. Patient presented to the ED with complaints of abdominal pain with distention of 3 days duration.  She reports nausea, dry heaving but no real vomiting.  She thinks her last bowel movement was 3 days ago.  She denies prior episodes of small bowel obstruction, she denies prior abdominal surgeries.   ED Course: Heart rate 95-105.  Temperature 97.8.  Sats 93 to 100% on room air. WBC 16.1. CT abdomen and pelvis W contrast-small bowel obstruction with transition point in the distal ileum in the right lower quadrant.  Mesenteric edema present.  No pneumatosis or free air.  Cholelithiasis with distended gallbladder. EDP talked to general surgeon Dr. Henreitta Leber, will see in consult in a.m., NG tube, p.o., IV fluids, hold Plavix.    Assessment & Plan:   Principal Problem:   SBO (small bowel obstruction) (HCC) Active Problems:   SIRS (systemic inflammatory response syndrome) (HCC)   HTN (hypertension)   Chronic respiratory failure with hypoxia (HCC)   Liver lesion   COPD (chronic obstructive pulmonary disease) (HCC)   PAD (peripheral artery disease) (HCC)       Assessment and Plan: * SBO (small bowel obstruction) (HCC) -Reports she may had some gas but no bowel  movements - NPO -NG tube in place - IV D5-LR w/ 20K+ @ 75 cc/hr - IV morphine 2 mg q4 hr PRN - Zofran PRN - NG tube replaced  07/07/2023 - Appreciate surgery following up   SIRS (systemic inflammatory response syndrome) (HCC) -Improved SIRS physiology - IV zosyn 3.375 g q8hr   PAD (peripheral artery disease) (HCC) - Plavix on hold per surgery   COPD (chronic obstructive pulmonary disease) (HCC) -Stable on room air satting 98% - Albuterol q4 hr PRN  - Anoro ellipta 1 puff daily   Liver lesion - Incidental finding on CT- Indeterminate 12 mm hypodensity in the right lobe of the liver. Recommend further evaluation with ultrasound or MRI. -Will obtain MRI of the abdomen - Needs follow-up  Chronic respiratory failure with hypoxia (HCC)  -Currently stable on room air On 2 L as needed.  Has not required it for quite a while.   HTN (hypertension) Stable.  Not on medications.    ----------------------------------------------------------------------------------------------------------------------------------------------- Nutritional status:  The patient's BMI is: Body mass index is 22.85 kg/m. I agree with the assessment and plan as outlined  ------------------------------------------------------------------------------------------------------------------------------------------------  DVT prophylaxis:  SCDs Start: 07/06/23 2215   Code Status:   Code Status: Full Code  Family Communication: Husband at bedside-updated. -Advance care planning has been discussed.   Admission status:   Status is: Inpatient Remains inpatient appropriate because: Needing surgical evaluation   Disposition: From  - home             Planning for discharge in 1-2 days: to   Procedures:   No admission procedures for hospital encounter.   Antimicrobials:  Anti-infectives (From admission, onward)    Start     Dose/Rate Route Frequency Ordered Stop   07/07/23 0200  piperacillin-tazobactam  (ZOSYN) IVPB 3.375 g        3.375 g 12.5 mL/hr over 240 Minutes Intravenous Every 8 hours 07/06/23 2132     07/06/23 1730  piperacillin-tazobactam (ZOSYN) IVPB 3.375 g        3.375 g 100 mL/hr over 30 Minutes Intravenous  Once 07/06/23 1715 07/06/23 1846        Medication:   sennosides  10 mL Oral BID   umeclidinium-vilanterol  1 puff Inhalation Daily    acetaminophen **OR** acetaminophen, albuterol, morphine injection, ondansetron **OR** ondansetron (ZOFRAN) IV, mouth rinse   Objective:   Vitals:   07/07/23 1336 07/07/23 2134 07/08/23 0626 07/08/23 0732  BP: (!) 142/63 (!) 150/73 (!) 145/71   Pulse: 91 95 (!) 102   Resp:      Temp: 98.4 F (36.9 C) 97.8 F (36.6 C) 98.4 F (36.9 C)   TempSrc: Axillary Oral Oral   SpO2: 99% 97% 100% 98%  Weight:      Height:        Intake/Output Summary (Last 24 hours) at 07/08/2023 1100 Last data filed at 07/08/2023 0732 Gross per 24 hour  Intake 627.3 ml  Output 900 ml  Net -272.7 ml   Filed Weights   07/06/23 1546 07/06/23 2150  Weight: 60 kg 60.4 kg     Physical examination:   Constitution:  Alert, cooperative, no distress,  Appears calm and comfortable  Psychiatric:   Normal and stable mood and affect, cognition intact,   HEENT:        Normocephalic, PERRL, otherwise with in Normal limits  Chest:         Chest symmetric Cardio vascular:  S1/S2, RRR, No murmure, No Rubs or Gallops  pulmonary: Clear to auscultation bilaterally, respirations unlabored, negative wheezes / crackles Abdomen: Soft, mild diffuse tenderness with deep palpation, hypoactive bowel sounds no masses, no organomegaly Muscular skeletal: Limited exam - in bed, able to move all 4 extremities,   Neuro: CNII-XII intact. , normal motor and sensation, reflexes intact  Extremities: No pitting edema lower extremities, +2 pulses  Skin: Dry, warm to touch, negative for any Rashes, No open wounds Wounds: per nursing  documentation   ------------------------------------------------------------------------------------------------------------------------------------------    LABs:     Latest Ref Rng & Units 07/08/2023    5:45 AM 07/07/2023    5:50 AM 07/06/2023    4:22 PM  CBC  WBC 4.0 - 10.5 K/uL 9.5  12.3  16.1   Hemoglobin 12.0 - 15.0 g/dL 44.0  10.2  72.5   Hematocrit 36.0 - 46.0 % 39.9  41.2  43.9   Platelets 150 - 400 K/uL 317  343  328       Latest Ref Rng & Units 07/08/2023    5:45 AM 07/07/2023    5:50 AM 07/06/2023    4:22 PM  CMP  Glucose 70 - 99 mg/dL 366  440  347   BUN 8 - 23 mg/dL 15  25  24   Creatinine 0.44 - 1.00 mg/dL 2.95  2.84  1.32   Sodium 135 - 145 mmol/L 142  141  140   Potassium 3.5 - 5.1 mmol/L 3.9  3.3  4.0   Chloride 98 - 111 mmol/L 105  105  101   CO2 22 - 32 mmol/L 30  26  28    Calcium 8.9 - 10.3 mg/dL 8.5  8.4  8.9   Total Protein 6.5 - 8.1 g/dL 6.0   7.0   Total Bilirubin 0.0 - 1.2 mg/dL 0.9   1.1   Alkaline Phos 38 - 126 U/L 64   70   AST 15 - 41 U/L 14   25   ALT 0 - 44 U/L 15   22        Micro Results Recent Results (from the past 240 hours)  Culture, blood (Routine X 2) w Reflex to ID Panel     Status: None (Preliminary result)   Collection Time: 07/07/23  5:50 AM   Specimen: BLOOD LEFT FOREARM  Result Value Ref Range Status   Specimen Description BLOOD LEFT FOREARM  Final   Special Requests BOTTLES DRAWN AEROBIC AND ANAEROBIC  Final   Culture   Final    NO GROWTH 1 DAY Performed at [facility], [address] [place], [place] [address]    Report Status PENDING  Incomplete  Culture, blood (Routine X 2) w Reflex to ID Panel     Status: None (Preliminary result)   Collection Time: 07/07/23  5:56 AM   Specimen: BLOOD  Result Value Ref Range Status   Specimen Description BLOOD BLOOD LEFT HAND  Final   Special Requests BOTTLES DRAWN AEROBIC AND ANAEROBIC  Final   Culture   Final    NO GROWTH 1 DAY Performed at [facility], [address], [place], [place] [address]    Report Status PENDING  Incomplete    Radiology Reports DG Abd Portable 1V-Small Bowel Protocol-Position Verification Result Date: 07/08/2023 CLINICAL DATA:  Nasogastric tube placement EXAM: PORTABLE ABDOMEN - 1 VIEW COMPARISON:  the previous day's study FINDINGS: Gastric tube is in the decompressed stomach, stable. There are a few gas dilated small bowel loops in the right mid abdomen, decreased in number since previous study. Colon appears decompressed. Multilevel spondylitic changes in the lumbar spine. IMPRESSION: 1. Gastric tube in decompressed stomach. 2. Persistent small bowel dilatation. Electronically Signed   By: Nicoletta Barrier M.D.   On: 07/08/2023 08:26    SIGNED: Bobbetta Burnet, MD, FHM. FAAFP. Arlin Benes - [facility] Time spent - 55 min.  In seeing, evaluating and examining the patient. Reviewing medical records, labs, drawn plan of care. [facility],  Pager (please use [email or web address] to page/ text) Please use Epic Secure Chat for non-urgent communication (7AM-7PM)  If 7PM-7AM, please contact night-coverage [email or web address], 07/08/2023, 11:00 AM

## 2023-07-09 DIAGNOSIS — K56609 Unspecified intestinal obstruction, unspecified as to partial versus complete obstruction: Secondary | ICD-10-CM | POA: Diagnosis not present

## 2023-07-09 LAB — COMPREHENSIVE METABOLIC PANEL WITH GFR
ALT: 2028 U/L — ABNORMAL HIGH (ref 0–44)
AST: 2432 U/L — ABNORMAL HIGH (ref 15–41)
Albumin: 2.8 g/dL — ABNORMAL LOW (ref 3.5–5.0)
Alkaline Phosphatase: 111 U/L (ref 38–126)
Anion gap: 13 (ref 5–15)
BUN: 27 mg/dL — ABNORMAL HIGH (ref 8–23)
CO2: 27 mmol/L (ref 22–32)
Calcium: 8.7 mg/dL — ABNORMAL LOW (ref 8.9–10.3)
Chloride: 108 mmol/L (ref 98–111)
Creatinine, Ser: 1.29 mg/dL — ABNORMAL HIGH (ref 0.44–1.00)
GFR, Estimated: 44 mL/min — ABNORMAL LOW (ref >60)
Glucose, Bld: 136 mg/dL — ABNORMAL HIGH (ref 70–99)
Potassium: 3.7 mmol/L (ref 3.5–5.1)
Sodium: 148 mmol/L — ABNORMAL HIGH (ref 135–145)
Total Bilirubin: 0.8 mg/dL (ref 0.0–1.2)
Total Protein: 6.5 g/dL (ref 6.5–8.1)

## 2023-07-09 LAB — HEPATITIS PANEL, ACUTE
HCV Ab: NONREACTIVE
Hep A IgM: NONREACTIVE
Hep B C IgM: NONREACTIVE
Hepatitis B Surface Ag: NONREACTIVE

## 2023-07-09 LAB — CBC
HCT: 41.3 % (ref 36.0–46.0)
Hemoglobin: 13 g/dL (ref 12.0–15.0)
MCH: 34.2 pg — ABNORMAL HIGH (ref 26.0–34.0)
MCHC: 31.5 g/dL (ref 30.0–36.0)
MCV: 108.7 fL — ABNORMAL HIGH (ref 80.0–100.0)
Platelets: 339 10*3/uL (ref 150–400)
RBC: 3.8 MIL/uL — ABNORMAL LOW (ref 3.87–5.11)
RDW: 13.3 % (ref 11.5–15.5)
WBC: 11.1 10*3/uL — ABNORMAL HIGH (ref 4.0–10.5)
nRBC: 0 % (ref 0.0–0.2)

## 2023-07-09 LAB — PHOSPHORUS: Phosphorus: 3.9 mg/dL (ref 2.5–4.6)

## 2023-07-09 LAB — MAGNESIUM: Magnesium: 2.4 mg/dL (ref 1.7–2.4)

## 2023-07-09 MED ORDER — HYDRALAZINE HCL 20 MG/ML IJ SOLN
10.0000 mg | Freq: Four times a day (QID) | INTRAMUSCULAR | Status: DC | PRN
  Administered 2023-07-09 – 2023-07-14 (×3): 10 mg via INTRAVENOUS
  Filled 2023-07-09 (×3): qty 1

## 2023-07-09 MED ORDER — SODIUM CHLORIDE 0.9 % IV SOLN: INTRAVENOUS | Status: DC

## 2023-07-09 NOTE — Plan of Care (Signed)

## 2023-07-09 NOTE — Care Management Important Message (Signed)
 Important Message  Patient Details  Name: Desiree Barber MRN: [id number] Date of Birth: [birth year]   Important Message Given:  Yes - Medicare IM     [name] 07/09/2023, 1:48 PM

## 2023-07-09 NOTE — Progress Notes (Signed)
 PROGRESS NOTE  ARDYCE HEYER UEA:[id number] DOB: [birth year] DOA: 07/06/2023 PCP: Minus Amel, MD   LOS: 3 days   Brief narrative:  Desiree Barber is a 73 y.o. female with past medical history significant for chronic respiratory failure on as needed O2, COPD, hypertension, peripheral artery disease presented to hospital with abdominal pain and distention for 3 days with some nausea and dry heaving.  In the ED patient was slightly tachycardic.CT abdomen and pelvis W contrast-small bowel obstruction with transition point in the distal ileum in the right lower quadrant.  Mesenteric edema present.  No pneumatosis or free air.  Cholelithiasis with distended gallbladder.  General surgery was consulted and patient was started on NG tube IV fluids and Plavix was on hold.  Patient was then admitted hospital for further evaluation and treatment.    Assessment/Plan: Principal Problem:   SBO (small bowel obstruction) (HCC) Active Problems:   SIRS (systemic inflammatory response syndrome) (HCC)   Acute respiratory failure with hypoxia (HCC)   HTN (hypertension)   Chronic respiratory failure with hypoxia (HCC)   Liver lesion   COPD (chronic obstructive pulmonary disease) (HCC)   PAD (peripheral artery disease) (HCC)  Acute respiratory failure-underlying history of COPD,  Continue supportive care.  Received 1 dose of IV Lasix Solu-Medrol DuoNebs.  Continue incentive spirometry.  Currently on 3 L of oxygen by nasal cannula.  Possibility of infiltrate in the lungs.  Continue with antibiotics.  CT angiogram of the chest was negative for PE but trace pleural effusion and atelectasis with airway plugging and volume loss in the middle lobe and inferiorly.  Will add incentive spirometry and flutter valve.  Patient appears to be on the volume depleted side so we will add some IV fluids today.  Check 2D echocardiogram as well  Concern for altered mental status.  CT head scan was negative for acute findings  for chronic microvascular changes.  CT venogram without any IVC thrombosis or portal vein thrombosis.  Dilated small bowel loops with air-fluid levels.  Small bowel obstruction.  Continue n.p.o. NG tube IV fluids antiemetics and supportive care.  Follow surgical recommendation.  Not on IV fluids.  Will reinstate IV fluids.  Elevated LFTs, could be related to infections, shock liver Elevated AST ALT total bilirubin within normal limits.  AST ALT has significantly trended up.  Will check hepatitis panel.  Right upper quadrant MRI, CT scan with fatty liver.  No biliary obstruction.  No lactic acidosis.  Will avoid hypotension.  SIRS (systemic inflammatory response syndrome)  Improved.  On Zosyn empirically.  Blood cultures negative in 2 days.  Leukocytosis trending down.  Temperature max of 100.4 F  PAD (peripheral artery disease)  Continue to hold Plavix.  Hypophosphatemia.  Replenished and improved.  Mild hyponatremia.  Likely secondary to volume depletion.  Will continue with normal saline hydration today.  Elevated creatinine likely mild acute kidney injury.  Creatinine today at 1.2.  Will start with IV fluids.  Continue to monitor renal function closely.  COPD (chronic obstructive pulmonary disease) (HCC) Continue bronchodilators.  Currently on 3 L of oxygen by nasal cannula.  Liver lesion CT showed indeterminate hypodensity.  MRI of the liver with distended gallbladder, fatty liver possible late enhancing hemangioma.  HTN (hypertension) Will add as needed antihypertensives.  Will avoid for now.  DVT prophylaxis: heparin injection 5,000 Units Start: 07/08/23 1615 SCDs Start: 07/08/23 1520 Place TED hose Start: 07/08/23 1520 SCDs Start: 07/06/23 2215   Disposition: Likely home in 2 to  3 days  Status is: Inpatient Remains inpatient appropriate because: Pending clinical improvement, concern for bowel obstruction, AKI, elevated LFTs    Code Status:     Code Status: Full  Code  Family Communication: Spoke with the patient's family at bedside.  Consultants: General Surgery  Procedures: None  Anti-infectives:  Zosyn IV  Anti-infectives (From admission, onward)    Start     Dose/Rate Route Frequency Ordered Stop   07/07/23 0200  piperacillin-tazobactam (ZOSYN) IVPB 3.375 g        3.375 g 12.5 mL/hr over 240 Minutes Intravenous Every 8 hours 07/06/23 2132     07/06/23 1730  piperacillin-tazobactam (ZOSYN) IVPB 3.375 g        3.375 g 100 mL/hr over 30 Minutes Intravenous  Once 07/06/23 1715 07/06/23 1846        Subjective: Today, patient was seen and examined at bedside.  Patient denies any nausea vomiting has had 2 bowel movements yesterday.  Denies any shortness of breath cough but has oxygen requirement at 3 L/min.  Objective: Vitals:   07/09/23 0900 07/09/23 0940  BP: (!) 162/75 (!) 156/80  Pulse: 96 93  Resp: 17   Temp: 97.6 F (36.4 C)   SpO2: 95%     Intake/Output Summary (Last 24 hours) at 07/09/2023 1112 Last data filed at 07/09/2023 1058 Gross per 24 hour  Intake 246.72 ml  Output 1901 ml  Net -1654.28 ml   Filed Weights   07/06/23 1546 07/06/23 2150  Weight: 60 kg 60.4 kg   Body mass index is 22.85 kg/m.   Physical Exam: GENERAL: Patient is alert awake and oriented. Not in obvious distress.  On nasal cannula oxygen, NG tube in place HENT: No scleral pallor or icterus. Pupils equally reactive to light. Oral mucosa is moist NECK: is supple, no gross swelling noted. CHEST: Clear to auscultation.  CVS: S1 and S2 heard, no murmur. Regular rate and rhythm.  ABDOMEN: Soft, nonspecific tenderness on palpation, EXTREMITIES: No edema. CNS: Cranial nerves are intact. No focal motor deficits. SKIN: warm and dry without rashes.  Data Review: I have personally reviewed the following laboratory data and studies,  CBC: Recent Labs  Lab 07/06/23 1622 07/07/23 0550 07/08/23 0545 07/08/23 1633 07/09/23 0349  WBC 16.1*  12.3* 9.5 10.3 11.1*  NEUTROABS 14.2*  --   --   --   --   HGB 14.2 12.8 12.1 13.1 13.0  HCT 43.9 41.2 39.9 42.4 41.3  MCV 107.9* 108.7* 110.8* 111.9* 108.7*  PLT 328 343 317 385 339   Basic Metabolic Panel: Recent Labs  Lab 07/07/23 0550 07/08/23 0545 07/08/23 1633 07/08/23 2108 07/09/23 0349  NA 141 142 143 143 148*  K 3.3* 3.9 4.0 3.8 3.7  CL 105 105 103 104 108  CO2 26 30 26 25 27   GLUCOSE 124* 108* 144* 154* 136*  BUN 25* 15 26* 30* 27*  CREATININE 0.68 0.59 1.31* 1.31* 1.29*  CALCIUM 8.4* 8.5* 8.9 8.7* 8.7*  MG  --  2.1  --   --  2.4  PHOS  --  2.3*  --   --  3.9   Liver Function Tests: Recent Labs  Lab 07/06/23 1622 07/08/23 0545 07/08/23 1633 07/08/23 2108 07/09/23 0349  AST 25 14* 1,074* 2,912* 2,432*  ALT 22 15 852* 1,819* 2,028*  ALKPHOS 70 64 153* 149* 111  BILITOT 1.1 0.9 1.1 1.0 0.8  PROT 7.0 6.0* 6.8 6.6 6.5  ALBUMIN 3.3* 2.7* 3.0* 3.0* 2.8*  Recent Labs  Lab 07/06/23 1622  LIPASE 24   No results for input(s): "AMMONIA" in the last 168 hours. Cardiac Enzymes: No results for input(s): "CKTOTAL", "CKMB", "CKMBINDEX", "TROPONINI" in the last 168 hours. BNP (last 3 results) Recent Labs    06/27/23 1121 07/08/23 1633  BNP 34.0 236.0*    ProBNP (last 3 results) No results for input(s): "PROBNP" in the last 8760 hours.  CBG: No results for input(s): "GLUCAP" in the last 168 hours. Recent Results (from the past 240 hours)  Culture, blood (Routine X 2) w Reflex to ID Panel     Status: None (Preliminary result)   Collection Time: 07/07/23  5:50 AM   Specimen: BLOOD LEFT FOREARM  Result Value Ref Range Status   Specimen Description BLOOD LEFT FOREARM  Final   Special Requests BOTTLES DRAWN AEROBIC AND ANAEROBIC  Final   Culture   Final    NO GROWTH 2 DAYS Performed at [facility], [address] [place], [place] [address]    Report Status PENDING  Incomplete  Culture, blood (Routine X 2) w Reflex to ID Panel     Status: None (Preliminary  result)   Collection Time: 07/07/23  5:56 AM   Specimen: BLOOD  Result Value Ref Range Status   Specimen Description BLOOD BLOOD LEFT HAND  Final   Special Requests BOTTLES DRAWN AEROBIC AND ANAEROBIC  Final   Culture   Final    NO GROWTH 2 DAYS Performed at [facility], [address], [place], [place] [address]    Report Status PENDING  Incomplete     Studies: CT HEAD WO CONTRAST ( ) Result Date: 07/08/2023 CLINICAL DATA:  Mental status change, unknown cause EXAM: CT HEAD WITHOUT CONTRAST TECHNIQUE: Contiguous axial images were obtained from the base of the skull through the vertex without intravenous contrast. RADIATION DOSE REDUCTION: This exam was performed according to the departmental dose-optimization program which includes automated exposure control, adjustment of the mA and/or kV according to patient size and/or use of iterative reconstruction technique. COMPARISON:  MRI head May 17, 22. FINDINGS: Brain: No evidence of acute infarction, hemorrhage, hydrocephalus, extra-axial collection or mass lesion/mass effect. Patchy white matter hypodensities are nonspecific but compatible with chronic microvascular ischemic disease. Vascular: No hyperdense vessel identified. Calcific atherosclerosis. Skull: No acute fracture. Sinuses/Orbits: Clear sinuses.  No acute findings. Other: No mastoid effusions. IMPRESSION: 1. No evidence of acute intracranial abnormality. 2. Chronic microvascular ischemic change. Electronically Signed   By: Stevenson Elbe M.D.   On: 07/08/2023 22:55   CT VENOGRAM ABD/PEL Result Date: 07/08/2023 CLINICAL DATA:  Altered mental status with abdominal pain. EXAM: CT ABDOMEN WITHOUT CONTRAST CT PELVIS WITH CONTRAST TECHNIQUE: Multidetector Multidetector CT imaging of the pelvis was performed using the venogram protocol following the bolus administration of intravenous contrast. RADIATION DOSE REDUCTION: This exam was performed according to the departmental dose-optimization  program which includes automated exposure control, adjustment of the mA and/or kV according to patient size and/or use of iterative reconstruction technique. CONTRAST:  50mL OMNIPAQUE IOHEXOL 350 MG/ML SOLN COMPARISON:  Lower chest: There is some tree-in-bud opacities in the right middle lobe, unchanged. There is dependent atelectasis in the lower lobes. Hepatobiliary: Gallbladder is dilated. The gallstone is present measuring 2.2 cm. There is no biliary ductal dilatation. Appendix is unchanged from the prior examination. They hypodense lesion in the posterior right lobe of the liver measures 11 mm and is unchanged. No new liver lesions are seen. Pancreas: Unremarkable. No pancreatic ductal dilatation or  surrounding inflammatory changes. Spleen: Normal in size without focal abnormality. Adrenals/Urinary Tract: Adrenal glands are unremarkable. Kidneys are normal, without renal calculi, focal lesion, or hydronephrosis. Bladder is unremarkable. Stomach/Bowel: Oral contrast reaches the rectum. There are air-fluid levels throughout mildly dilated central small bowel loops. The stomach is decompressed and the nasogastric tube tip is in the proximal body. The colon is nondilated. The appendix is not seen. Vascular/Lymphatic: Aorta and IVC are normal in size. No evidence for IVC thrombosis. There are atherosclerotic calcifications of the aorta. Superior mesenteric vein and portal veins is here patent is can be seen. Splenic vein is grossly patent as well. Reproductive: Uterus and bilateral adnexa are unremarkable. Other: There is trace free fluid in the pelvis. There is no abdominal wall hernia the Musculoskeletal: Degenerative changes affect the spine. IMPRESSION: 1. No evidence for IVC thrombosis or portal venous thrombosis. 2. Mildly dilated central small bowel loops with air-fluid levels. Findings may represent ileus or enteritis. Low-grade partial small bowel obstruction is not excluded. Oral contrast reaches the  colon. 3. Stable indeterminate 11 mm hypodense lesion in the right lobe of the liver. Please see MRI report from 07/08/2023. A 4. Part cholelithiasis with dilated gallbladder. Correlate clinically for acute cholecystitis. 5. Trace free fluid in the pelvis. 6. Stable tree-in-bud opacities in the right middle lobe. 7. Aortic atherosclerosis. Electronically Signed   By: Tyron Gallon M.D.   On: 07/08/2023 22:55   DG Abd 1 View Result Date: 07/08/2023 CLINICAL DATA:  Small bowel obstruction EXAM: ABDOMEN - 1 VIEW COMPARISON:  Abdominal x-ray 07/08/2023 FINDINGS: Enteric tube tip is in the body of the stomach. Dilated air-filled small bowel loops are again seen in the central abdomen measuring up to 4.5 cm. Degree of dilatation has mildly decreased. Contrast is seen throughout nondilated colon and within the bladder. Lung bases are clear. No acute fractures. IMPRESSION: Mild decrease in small bowel dilatation. Small bowel obstruction persists. Contrast is seen throughout nondilated colon. Electronically Signed   By: Tyron Gallon M.D.   On: 07/08/2023 21:58   CT Angio Chest Pulmonary Embolism (PE) W or WO Contrast Result Date: 07/08/2023 CLINICAL DATA:  Elevated D-dimer level with shortness of breath, abdominal pain, nausea, and weakness. EXAM: CT ANGIOGRAPHY CHEST WITH CONTRAST TECHNIQUE: Multidetector CT imaging of the chest was performed using the standard protocol during bolus administration of intravenous contrast. Multiplanar CT image reconstructions and MIPs were obtained to evaluate the vascular anatomy. RADIATION DOSE REDUCTION: This exam was performed according to the departmental dose-optimization program which includes automated exposure control, adjustment of the mA and/or kV according to patient size and/or use of iterative reconstruction technique. CONTRAST:  75mL OMNIPAQUE IOHEXOL 350 MG/ML SOLN COMPARISON:  04/08/2023 and chest radiograph from 07/08/2023 FINDINGS: Cardiovascular: No filling defect  is identified in the pulmonary arterial tree to suggest pulmonary embolus. Coronary, aortic arch, and branch vessel atherosclerotic vascular disease. Mild right heart prominence. Mediastinum/Nodes: Nonspecific fullness of the gastroesophageal junction. Possible small type 1 hiatal hernia. A nasogastric/orogastric tube is present extending into the stomach. No pathologic adenopathy. Lungs/Pleura: Trace bilateral pleural effusions with passive atelectasis. Biapical pleuroparenchymal scarring. Emphysema. Airway plugging in volume loss in the right middle lobe and inferiorly in the anterior right upper lobe. Mild airway plugging in the lingula. Some of the right upper lobe nodularity shown previously on 04/08/2023 has resolved but other areas of nodularity mostly reflecting mucous plugging are observed. These are likely inflammatory. Upper Abdomen: Abdominal aortic atherosclerosis. Musculoskeletal: Lower cervical plate and screw fixator.  40% superior endplate compression fracture at T6 is likely acute or subacute given the associated sclerosis, and was not present on 04/08/2023. No significant posterior bony retropulsion. Review of the MIP images confirms the above findings. IMPRESSION: 1. No filling defect is identified in the pulmonary arterial tree to suggest pulmonary embolus. 2. 40% superior endplate compression fracture at T6 is likely acute or subacute given the associated sclerosis, and was not present on 04/08/2023. No significant posterior bony retropulsion. 3. Trace bilateral pleural effusions with passive atelectasis. 4. Airway plugging and volume loss in the right middle lobe and inferiorly in the anterior right upper lobe. Mild airway plugging in the lingula. Some of the right upper lobe nodularity shown previously on 04/08/2023 has resolved but other areas of nodularity mostly reflecting mucous plugging are observed. These are likely inflammatory. 5. Nonspecific fullness of the gastroesophageal junction.  Possible small type 1 hiatal hernia. 6.  Aortic Atherosclerosis (ICD10-I70.0).  Coronary atherosclerosis. Electronically Signed   By: Gaylyn Rong M.D.   On: 07/08/2023 21:00   DG Abd Portable 1V-Small Bowel Obstruction Protocol-initial, 8 hr delay Result Date: 07/08/2023 CLINICAL DATA:  8 hour delay small-bowel obstruction protocol assessment EXAM: PORTABLE ABDOMEN - 1 VIEW COMPARISON:  07/08/2023 at 8:04 a.m. FINDINGS: Nasogastric/orogastric tube noted with side port at the gastroesophageal junction and distal tip in the vicinity of the stomach body. Contrast medium is visualized in dilated loops of small bowel in the lower abdomen, these loops measure up to 4 cm. There also some gas-filled loops more cephalad measuring up to 4.7 cm, which is similar to the earlier exam. Right lower quadrant contrast is outline by apparent haustral margins favoring cecal contrast. Moreover, there is some contrast in what appears to be a small caliber descending colon, favoring colonic extension. IMPRESSION: 1. Contrast medium is visualized in dilated loops of small bowel in the lower abdomen, these loops measure up to 4 cm. There also some gas-filled loops more cephalad measuring up to 4.7 cm, which is similar to the earlier exam. 2. Right lower quadrant contrast is outline by apparent haustral margins favoring cecal contrast. Moreover, there is some contrast in what appears to be a small caliber descending colon, favoring colonic extension. Overall the appearance is strongly suggestive of colonic contrast medium favoring partial small bowel obstruction or small-bowel ileus as a cause for the small bowel dilatation. 3. Nasogastric/orogastric tube noted with side port at the gastroesophageal junction and distal tip in the vicinity of the stomach body. Electronically Signed   By: Gaylyn Rong M.D.   On: 07/08/2023 20:49   DG CHEST PORT 1 VIEW Result Date: 07/08/2023 CLINICAL DATA:  Shortness of breath EXAM:  PORTABLE CHEST 1 VIEW COMPARISON:  07/06/2023 FINDINGS: NG tube is in the stomach. Heart and mediastinal contours are within normal limits and stable. Aortic atherosclerosis. No confluent airspace opacities or effusions. No acute bony abnormality. IMPRESSION: No active disease. Electronically Signed   By: Charlett Nose M.D.   On: 07/08/2023 19:29   MR LIVER W WO CONTRAST Result Date: 07/08/2023 CLINICAL DATA:  Mass lesion. EXAM: MRI ABDOMEN WITHOUT AND WITH CONTRAST TECHNIQUE: Multiplanar multisequence MR imaging of the abdomen was performed both before and after the administration of intravenous contrast. CONTRAST:  6mL GADAVIST GADOBUTROL 1 MMOL/ML IV SOLN COMPARISON:  CT 07/06/2023 abdomen pelvis. FINDINGS: Lower chest: Trace bilateral pleural fluid. There are areas of interstitial thickening along the lung bases. Please correlate with prior CT. Hepatobiliary: Dilated gallbladder with a dependent stone. No biliary  ductal dilatation. Gallbladder wall is thin enhance sing. Please correlate with symptomatology. Patent portal vein. There is diffuse fatty infiltration with some subtle dropout on out of phase imaging. 12 mm 0 lesion peripherally in the posterior right hepatic lobe is seen today as a mildly bright T2 lesion, not as bright as simple fluid. Lesion is not as well seen on precontrast T1. Mild enhancement along the most delayed dataset. Subtraction datasets are limited due to significant motion throughout the examination. There is some separate tiny more simple cystic foci identified in the right hepatic lobe. Pancreas: Mild pancreatic atrophy. Preserved pancreatic T1 signal. No restricted diffusion. No pancreatic ductal dilatation. Spleen:  Within normal limits in size and appearance. Adrenals/Urinary Tract: Adrenal glands are preserved. Mild right-sided renal atrophy. There is some tiny bright T2 low T1 foci along each kidney which are too small to completely characterize but likely benign cystic foci.  No enhancing renal masses. Stomach/Bowel: Stomach is mildly distended with some fluid. Visualized loops of large bowel are nondilated. There dilated loops of small bowel identified with air-fluid levels measuring up to 3.2 cm. Please correlate with findings at CT scan with possible developing obstruction. Vascular/Lymphatic: Atherosclerotic changes along the aorta. Normal caliber aorta and IVC. Known bilateral common iliac artery stents with susceptibility artifact today. Other:  Anasarca.  No frank ascites. Musculoskeletal: Moderate degenerative changes of the lumbar spine. Curvature of spine. Endplate Modic changes. IMPRESSION: Dilated gallbladder once again identified with a large stone. No biliary ductal dilatation. Please correlate with symptoms and if there is concern further of acute gallbladder pathology HIDA scan could be considered. Mildly dilated loops of small bowel with air-fluid levels. Please correlate prior CT scan for possible developing obstruction. Fatty liver infiltration. Small lesion posterior right hepatic lobe corresponding the finding by CT scan has relatively nonaggressive features. No clear abnormal enhancement. Based on overall appearance this could be a late enhancing hemangioma. Evaluation limited by motion. Electronically Signed   By: Adrianna Horde M.D.   On: 07/08/2023 16:29   DG Abd Portable 1V-Small Bowel Protocol-Position Verification Result Date: 07/08/2023 CLINICAL DATA:  Nasogastric tube placement EXAM: PORTABLE ABDOMEN - 1 VIEW COMPARISON:  the previous day's study FINDINGS: Gastric tube is in the decompressed stomach, stable. There are a few gas dilated small bowel loops in the right mid abdomen, decreased in number since previous study. Colon appears decompressed. Multilevel spondylitic changes in the lumbar spine. IMPRESSION: 1. Gastric tube in decompressed stomach. 2. Persistent small bowel dilatation. Electronically Signed   By: Nicoletta Barrier M.D.   On: 07/08/2023 08:26       Rosena Conradi, MD  [facility] 07/09/2023  If 7PM-7AM, please contact night-coverage

## 2023-07-10 ENCOUNTER — Inpatient Hospital Stay (HOSPITAL_COMMUNITY)

## 2023-07-10 ENCOUNTER — Other Ambulatory Visit (HOSPITAL_COMMUNITY)

## 2023-07-10 DIAGNOSIS — K56609 Unspecified intestinal obstruction, unspecified as to partial versus complete obstruction: Secondary | ICD-10-CM | POA: Diagnosis not present

## 2023-07-10 DIAGNOSIS — R0609 Other forms of dyspnea: Secondary | ICD-10-CM

## 2023-07-10 LAB — ECHOCARDIOGRAM COMPLETE
AR max vel: 1.54 cm2
AV Area VTI: 1.64 cm2
AV Area mean vel: 1.51 cm2
AV Mean grad: 7 mmHg
AV Peak grad: 13.5 mmHg
Ao pk vel: 1.84 m/s
Area-P 1/2: 3.65 cm2
Height: 64 in
Weight: 2129.6 [oz_av]

## 2023-07-10 LAB — COMPREHENSIVE METABOLIC PANEL WITH GFR
ALT: 1681 U/L — ABNORMAL HIGH (ref 0–44)
AST: 968 U/L — ABNORMAL HIGH (ref 15–41)
Albumin: 2.7 g/dL — ABNORMAL LOW (ref 3.5–5.0)
Alkaline Phosphatase: 108 U/L (ref 38–126)
Anion gap: 7 (ref 5–15)
BUN: 26 mg/dL — ABNORMAL HIGH (ref 8–23)
CO2: 32 mmol/L (ref 22–32)
Calcium: 8.4 mg/dL — ABNORMAL LOW (ref 8.9–10.3)
Chloride: 107 mmol/L (ref 98–111)
Creatinine, Ser: 0.96 mg/dL (ref 0.44–1.00)
GFR, Estimated: >60 mL/min (ref >60)
Glucose, Bld: 92 mg/dL (ref 70–99)
Potassium: 3 mmol/L — ABNORMAL LOW (ref 3.5–5.1)
Sodium: 146 mmol/L — ABNORMAL HIGH (ref 135–145)
Total Bilirubin: 0.8 mg/dL (ref 0.0–1.2)
Total Protein: 5.9 g/dL — ABNORMAL LOW (ref 6.5–8.1)

## 2023-07-10 LAB — MAGNESIUM: Magnesium: 2.3 mg/dL (ref 1.7–2.4)

## 2023-07-10 LAB — CBC
HCT: 42 % (ref 36.0–46.0)
Hemoglobin: 12.8 g/dL (ref 12.0–15.0)
MCH: 33.6 pg (ref 26.0–34.0)
MCHC: 30.5 g/dL (ref 30.0–36.0)
MCV: 110.2 fL — ABNORMAL HIGH (ref 80.0–100.0)
Platelets: 367 10*3/uL (ref 150–400)
RBC: 3.81 MIL/uL — ABNORMAL LOW (ref 3.87–5.11)
RDW: 13.2 % (ref 11.5–15.5)
WBC: 12.6 10*3/uL — ABNORMAL HIGH (ref 4.0–10.5)
nRBC: 0 % (ref 0.0–0.2)

## 2023-07-10 LAB — PHOSPHORUS: Phosphorus: 2.9 mg/dL (ref 2.5–4.6)

## 2023-07-10 MED ORDER — DIATRIZOATE MEGLUMINE & SODIUM 66-10 % PO SOLN
90.0000 mL | Freq: Once | ORAL | Status: AC
  Administered 2023-07-10: 90 mL via NASOGASTRIC
  Filled 2023-07-10: qty 90

## 2023-07-10 MED ORDER — POTASSIUM CHLORIDE 10 MEQ/100ML IV SOLN
10.0000 meq | INTRAVENOUS | Status: AC
  Administered 2023-07-10 (×6): 10 meq via INTRAVENOUS
  Filled 2023-07-10 (×6): qty 100

## 2023-07-10 MED ORDER — SODIUM CHLORIDE 0.9 % IV SOLN: INTRAVENOUS | Status: AC

## 2023-07-10 NOTE — Progress Notes (Signed)
 PROGRESS NOTE  Desiree Barber HBZ:[id number] DOB: 10-11-1950 DOA: 07/06/2023 PCP: Minus Amel, MD   LOS: 4 days   Brief narrative:  Desiree Barber is a 73 y.o. female with past medical history significant for chronic respiratory failure on as needed O2, COPD, hypertension, peripheral artery disease presented to hospital with abdominal pain and distention for 3 days with some nausea and dry heaving.  In the ED, patient was slightly tachycardic.  CT abdomen and pelvis with contrast-small bowel obstruction with transition point in the distal ileum in the right lower quadrant.  Mesenteric edema present.  No pneumatosis or free air.  Cholelithiasis with distended gallbladder.  General surgery was consulted and patient was started on NG tube IV fluids and Plavix was on hold.  Patient was then admitted hospital for further evaluation and treatment.    Assessment/Plan: Principal Problem:   SBO (small bowel obstruction) (HCC) Active Problems:   SIRS (systemic inflammatory response syndrome) (HCC)   Acute respiratory failure with hypoxia (HCC)   HTN (hypertension)   Chronic respiratory failure with hypoxia (HCC)   Liver lesion   COPD (chronic obstructive pulmonary disease) (HCC)   PAD (peripheral artery disease) (HCC)  Acute respiratory failure- underlying history of COPD,  Continue supportive care.  Received 1 dose of IV Lasix Solu-Medrol DuoNebs.  Continue incentive spirometry.  Currently on 2 L of oxygen by nasal cannula.  Possibility of infiltrate in the lungs.  Continue with antibiotics.  CT angiogram of the chest was negative for PE but trace pleural effusion and atelectasis with airway plugging and volume loss in the middle lobe and inferiorly.  Will add incentive spirometry and flutter valve.   Pending 2D echocardiogram   Concern for altered mental status.  CT head scan was negative for acute findings for chronic microvascular changes.  CT venogram without any IVC thrombosis or portal  vein thrombosis.  Dilated small bowel loops with air-fluid levels.  Small bowel obstruction.  Continue n.p.o. NG tube IV fluids antiemetics and supportive care.  Follow surgical recommendation.  Communicated with surgery for consultation.  Continue IV fluids.  Elevated LFTs, could be related to infections, shock liver Elevated AST ALT total bilirubin within normal limits.  AST ALT has significantly trended up with downtrend today.  Will continue to monitor LFTs..  Acute hepatitis panel is negative.  Right upper quadrant MRI, CT scan with fatty liver.  No biliary obstruction.  No lactic acidosis.  Will avoid hypotension.  SIRS (systemic inflammatory response syndrome)  Improved.  On Zosyn empirically.  Blood cultures negative in 3 days.  Leukocytosis trending down to 12.6 today.  Temperature max of 98.5.  F  Hypokalemia.  Potassium of 3.0 today.  Will replace with 60 mill equivalent of IV potassium supplements.  Check levels in AM.  PAD (peripheral artery disease)  Continue to hold Plavix.  Hypophosphatemia.  Replenished and improved.  Mild hypernatremia.  Likely secondary to volume depletion.  Continue with hydration.  Elevated creatinine likely mild acute kidney injury.  Creatinine today at 0.9 from at 1.2.  Continue hydration for 1 more day.  Reassess in AM.  COPD (chronic obstructive pulmonary disease) (HCC) Continue bronchodilators.  Currently on 3 L of oxygen by nasal cannula.  Liver lesion CT showed indeterminate hypodensity.  MRI of the liver with distended gallbladder, fatty liver possible late enhancing hemangioma.  HTN (hypertension) Will add as needed antihypertensives.  Will avoid for now.  DVT prophylaxis: heparin injection 5,000 Units Start: 07/08/23 1615 SCDs Start: 07/08/23  1520 Place TED hose Start: 07/08/23 1520 SCDs Start: 07/06/23 2215   Disposition: Likely home in 2 to 3 days, pending clinical improvement  Status is: Inpatient Remains inpatient  appropriate because: Pending clinical improvement, concern for bowel obstruction, AKI, elevated LFTs,    Code Status:     Code Status: Full Code  Family Communication: Spoke with the patient's family   Consultants: General Surgery  Procedures: None  Anti-infectives:  Zosyn IV  Anti-infectives (From admission, onward)    Start     Dose/Rate Route Frequency Ordered Stop   07/07/23 0200  piperacillin-tazobactam (ZOSYN) IVPB 3.375 g        3.375 g 12.5 mL/hr over 240 Minutes Intravenous Every 8 hours 07/06/23 2132     07/06/23 1730  piperacillin-tazobactam (ZOSYN) IVPB 3.375 g        3.375 g 100 mL/hr over 30 Minutes Intravenous  Once 07/06/23 1715 07/06/23 1846        Subjective: Today, patient was seen and examined at bedside.  Patient denies any shortness of breath chest pain fever nausea or vomiting but he still got NG tube with output.  Denies abdominal pain.  Has had bowel movements. Objective: Vitals:   07/10/23 0446 07/10/23 0744  BP: 133/73 (!) 151/77  Pulse: 83 74  Resp: 17 16  Temp: 98.5 F (36.9 C) 97.6 F (36.4 C)  SpO2: 98% 94%    Intake/Output Summary (Last 24 hours) at 07/10/2023 1424 Last data filed at 07/10/2023 1044 Gross per 24 hour  Intake 1707.16 ml  Output 3900 ml  Net -2192.84 ml   Filed Weights   07/06/23 1546 07/06/23 2150  Weight: 60 kg 60.4 kg   Body mass index is 22.85 kg/m.   Physical Exam: GENERAL: Patient is alert awake and oriented. Not in obvious distress.  On nasal cannula oxygen, NG tube in place with some output. HENT: No scleral pallor or icterus. Pupils equally reactive to light. Oral mucosa is moist NECK: is supple, no gross swelling noted. CHEST: Clear to auscultation.  CVS: S1 and S2 heard, no murmur. Regular rate and rhythm.  ABDOMEN: Soft, nontender, bowel sounds are present. EXTREMITIES: No edema. CNS: Cranial nerves are intact. No focal motor deficits. SKIN: warm and dry without rashes.  Data Review: I have  personally reviewed the following laboratory data and studies,  CBC: Recent Labs  Lab 07/06/23 1622 07/07/23 0550 07/08/23 0545 07/08/23 1633 07/09/23 0349 07/10/23 0647  WBC 16.1* 12.3* 9.5 10.3 11.1* 12.6*  NEUTROABS 14.2*  --   --   --   --   --   HGB 14.2 12.8 12.1 13.1 13.0 12.8  HCT 43.9 41.2 39.9 42.4 41.3 42.0  MCV 107.9* 108.7* 110.8* 111.9* 108.7* 110.2*  PLT 328 343 317 385 339 367   Basic Metabolic Panel: Recent Labs  Lab 07/08/23 0545 07/08/23 1633 07/08/23 2108 07/09/23 0349 07/10/23 0647  NA 142 143 143 148* 146*  K 3.9 4.0 3.8 3.7 3.0*  CL 105 103 104 108 107  CO2 30 26 25 27  32  GLUCOSE 108* 144* 154* 136* 92  BUN 15 26* 30* 27* 26*  CREATININE 0.59 1.31* 1.31* 1.29* 0.96  CALCIUM 8.5* 8.9 8.7* 8.7* 8.4*  MG 2.1  --   --  2.4 2.3  PHOS 2.3*  --   --  3.9 2.9   Liver Function Tests: Recent Labs  Lab 07/08/23 0545 07/08/23 1633 07/08/23 2108 07/09/23 0349 07/10/23 0647  AST 14* 1,074* 2,912* 2,432* 968*  ALT 15 852* 1,819* 2,028* 1,681*  ALKPHOS 64 153* 149* 111 108  BILITOT 0.9 1.1 1.0 0.8 0.8  PROT 6.0* 6.8 6.6 6.5 5.9*  ALBUMIN 2.7* 3.0* 3.0* 2.8* 2.7*   Recent Labs  Lab 07/06/23 1622  LIPASE 24   No results for input(s): "AMMONIA" in the last 168 hours. Cardiac Enzymes: No results for input(s): "CKTOTAL", "CKMB", "CKMBINDEX", "TROPONINI" in the last 168 hours. BNP (last 3 results) Recent Labs    06/27/23 1121 07/08/23 1633  BNP 34.0 236.0*    ProBNP (last 3 results) No results for input(s): "PROBNP" in the last 8760 hours.  CBG: No results for input(s): "GLUCAP" in the last 168 hours. Recent Results (from the past 240 hours)  Culture, blood (Routine X 2) w Reflex to ID Panel     Status: None (Preliminary result)   Collection Time: 07/07/23  5:50 AM   Specimen: BLOOD LEFT FOREARM  Result Value Ref Range Status   Specimen Description BLOOD LEFT FOREARM  Final   Special Requests BOTTLES DRAWN AEROBIC AND ANAEROBIC  Final    Culture   Final    NO GROWTH 3 DAYS Performed at [facility], [address] [place], [place] [address]    Report Status PENDING  Incomplete  Culture, blood (Routine X 2) w Reflex to ID Panel     Status: None (Preliminary result)   Collection Time: 07/07/23  5:56 AM   Specimen: BLOOD  Result Value Ref Range Status   Specimen Description BLOOD BLOOD LEFT HAND  Final   Special Requests BOTTLES DRAWN AEROBIC AND ANAEROBIC  Final   Culture   Final    NO GROWTH 3 DAYS Performed at [facility], [address] [place], [place] [address]    Report Status PENDING  Incomplete     Studies: CT HEAD WO CONTRAST ( ) Result Date: 07/08/2023 CLINICAL DATA:  Mental status change, unknown cause EXAM: CT HEAD WITHOUT CONTRAST TECHNIQUE: Contiguous axial images were obtained from the base of the skull through the vertex without intravenous contrast. RADIATION DOSE REDUCTION: This exam was performed according to the departmental dose-optimization program which includes automated exposure control, adjustment of the mA and/or kV according to patient size and/or use of iterative reconstruction technique. COMPARISON:  MRI head May 17, 22. FINDINGS: Brain: No evidence of acute infarction, hemorrhage, hydrocephalus, extra-axial collection or mass lesion/mass effect. Patchy white matter hypodensities are nonspecific but compatible with chronic microvascular ischemic disease. Vascular: No hyperdense vessel identified. Calcific atherosclerosis. Skull: No acute fracture. Sinuses/Orbits: Clear sinuses.  No acute findings. Other: No mastoid effusions. IMPRESSION: 1. No evidence of acute intracranial abnormality. 2. Chronic microvascular ischemic change. Electronically Signed   By: Stevenson Elbe M.D.   On: 07/08/2023 22:55   CT VENOGRAM ABD/PEL Result Date: 07/08/2023 CLINICAL DATA:  Altered mental status with abdominal pain. EXAM: CT ABDOMEN WITHOUT CONTRAST CT PELVIS WITH CONTRAST TECHNIQUE: Multidetector  Multidetector CT imaging of the pelvis was performed using the venogram protocol following the bolus administration of intravenous contrast. RADIATION DOSE REDUCTION: This exam was performed according to the departmental dose-optimization program which includes automated exposure control, adjustment of the mA and/or kV according to patient size and/or use of iterative reconstruction technique. CONTRAST:  50mL OMNIPAQUE IOHEXOL 350 MG/ML SOLN COMPARISON:  Lower chest: There is some tree-in-bud opacities in the right middle lobe, unchanged. There is dependent atelectasis in the lower lobes. Hepatobiliary: Gallbladder is dilated. The gallstone is present measuring 2.2 cm. There is no biliary ductal dilatation. Appendix  is unchanged from the prior examination. They hypodense lesion in the posterior right lobe of the liver measures 11 mm and is unchanged. No new liver lesions are seen. Pancreas: Unremarkable. No pancreatic ductal dilatation or surrounding inflammatory changes. Spleen: Normal in size without focal abnormality. Adrenals/Urinary Tract: Adrenal glands are unremarkable. Kidneys are normal, without renal calculi, focal lesion, or hydronephrosis. Bladder is unremarkable. Stomach/Bowel: Oral contrast reaches the rectum. There are air-fluid levels throughout mildly dilated central small bowel loops. The stomach is decompressed and the nasogastric tube tip is in the proximal body. The colon is nondilated. The appendix is not seen. Vascular/Lymphatic: Aorta and IVC are normal in size. No evidence for IVC thrombosis. There are atherosclerotic calcifications of the aorta. Superior mesenteric vein and portal veins is here patent is can be seen. Splenic vein is grossly patent as well. Reproductive: Uterus and bilateral adnexa are unremarkable. Other: There is trace free fluid in the pelvis. There is no abdominal wall hernia the Musculoskeletal: Degenerative changes affect the spine. IMPRESSION: 1. No evidence for IVC  thrombosis or portal venous thrombosis. 2. Mildly dilated central small bowel loops with air-fluid levels. Findings may represent ileus or enteritis. Low-grade partial small bowel obstruction is not excluded. Oral contrast reaches the colon. 3. Stable indeterminate 11 mm hypodense lesion in the right lobe of the liver. Please see MRI report from 07/08/2023. A 4. Part cholelithiasis with dilated gallbladder. Correlate clinically for acute cholecystitis. 5. Trace free fluid in the pelvis. 6. Stable tree-in-bud opacities in the right middle lobe. 7. Aortic atherosclerosis. Electronically Signed   By: Tyron Gallon M.D.   On: 07/08/2023 22:55   DG Abd 1 View Result Date: 07/08/2023 CLINICAL DATA:  Small bowel obstruction EXAM: ABDOMEN - 1 VIEW COMPARISON:  Abdominal x-ray 07/08/2023 FINDINGS: Enteric tube tip is in the body of the stomach. Dilated air-filled small bowel loops are again seen in the central abdomen measuring up to 4.5 cm. Degree of dilatation has mildly decreased. Contrast is seen throughout nondilated colon and within the bladder. Lung bases are clear. No acute fractures. IMPRESSION: Mild decrease in small bowel dilatation. Small bowel obstruction persists. Contrast is seen throughout nondilated colon. Electronically Signed   By: Tyron Gallon M.D.   On: 07/08/2023 21:58   CT Angio Chest Pulmonary Embolism (PE) W or WO Contrast Result Date: 07/08/2023 CLINICAL DATA:  Elevated D-dimer level with shortness of breath, abdominal pain, nausea, and weakness. EXAM: CT ANGIOGRAPHY CHEST WITH CONTRAST TECHNIQUE: Multidetector CT imaging of the chest was performed using the standard protocol during bolus administration of intravenous contrast. Multiplanar CT image reconstructions and MIPs were obtained to evaluate the vascular anatomy. RADIATION DOSE REDUCTION: This exam was performed according to the departmental dose-optimization program which includes automated exposure control, adjustment of the mA  and/or kV according to patient size and/or use of iterative reconstruction technique. CONTRAST:  75mL OMNIPAQUE IOHEXOL 350 MG/ML SOLN COMPARISON:  04/08/2023 and chest radiograph from 07/08/2023 FINDINGS: Cardiovascular: No filling defect is identified in the pulmonary arterial tree to suggest pulmonary embolus. Coronary, aortic arch, and branch vessel atherosclerotic vascular disease. Mild right heart prominence. Mediastinum/Nodes: Nonspecific fullness of the gastroesophageal junction. Possible small type 1 hiatal hernia. A nasogastric/orogastric tube is present extending into the stomach. No pathologic adenopathy. Lungs/Pleura: Trace bilateral pleural effusions with passive atelectasis. Biapical pleuroparenchymal scarring. Emphysema. Airway plugging in volume loss in the right middle lobe and inferiorly in the anterior right upper lobe. Mild airway plugging in the lingula. Some of the right  upper lobe nodularity shown previously on 04/08/2023 has resolved but other areas of nodularity mostly reflecting mucous plugging are observed. These are likely inflammatory. Upper Abdomen: Abdominal aortic atherosclerosis. Musculoskeletal: Lower cervical plate and screw fixator. 40% superior endplate compression fracture at T6 is likely acute or subacute given the associated sclerosis, and was not present on 04/08/2023. No significant posterior bony retropulsion. Review of the MIP images confirms the above findings. IMPRESSION: 1. No filling defect is identified in the pulmonary arterial tree to suggest pulmonary embolus. 2. 40% superior endplate compression fracture at T6 is likely acute or subacute given the associated sclerosis, and was not present on 04/08/2023. No significant posterior bony retropulsion. 3. Trace bilateral pleural effusions with passive atelectasis. 4. Airway plugging and volume loss in the right middle lobe and inferiorly in the anterior right upper lobe. Mild airway plugging in the lingula. Some of the  right upper lobe nodularity shown previously on 04/08/2023 has resolved but other areas of nodularity mostly reflecting mucous plugging are observed. These are likely inflammatory. 5. Nonspecific fullness of the gastroesophageal junction. Possible small type 1 hiatal hernia. 6.  Aortic Atherosclerosis (ICD10-I70.0).  Coronary atherosclerosis. Electronically Signed   By: Gaylyn Rong M.D.   On: 07/08/2023 21:00   DG Abd Portable 1V-Small Bowel Obstruction Protocol-initial, 8 hr delay Result Date: 07/08/2023 CLINICAL DATA:  8 hour delay small-bowel obstruction protocol assessment EXAM: PORTABLE ABDOMEN - 1 VIEW COMPARISON:  07/08/2023 at 8:04 a.m. FINDINGS: Nasogastric/orogastric tube noted with side port at the gastroesophageal junction and distal tip in the vicinity of the stomach body. Contrast medium is visualized in dilated loops of small bowel in the lower abdomen, these loops measure up to 4 cm. There also some gas-filled loops more cephalad measuring up to 4.7 cm, which is similar to the earlier exam. Right lower quadrant contrast is outline by apparent haustral margins favoring cecal contrast. Moreover, there is some contrast in what appears to be a small caliber descending colon, favoring colonic extension. IMPRESSION: 1. Contrast medium is visualized in dilated loops of small bowel in the lower abdomen, these loops measure up to 4 cm. There also some gas-filled loops more cephalad measuring up to 4.7 cm, which is similar to the earlier exam. 2. Right lower quadrant contrast is outline by apparent haustral margins favoring cecal contrast. Moreover, there is some contrast in what appears to be a small caliber descending colon, favoring colonic extension. Overall the appearance is strongly suggestive of colonic contrast medium favoring partial small bowel obstruction or small-bowel ileus as a cause for the small bowel dilatation. 3. Nasogastric/orogastric tube noted with side port at the  gastroesophageal junction and distal tip in the vicinity of the stomach body. Electronically Signed   By: Gaylyn Rong M.D.   On: 07/08/2023 20:49   DG CHEST PORT 1 VIEW Result Date: 07/08/2023 CLINICAL DATA:  Shortness of breath EXAM: PORTABLE CHEST 1 VIEW COMPARISON:  07/06/2023 FINDINGS: NG tube is in the stomach. Heart and mediastinal contours are within normal limits and stable. Aortic atherosclerosis. No confluent airspace opacities or effusions. No acute bony abnormality. IMPRESSION: No active disease. Electronically Signed   By: Charlett Nose M.D.   On: 07/08/2023 19:29      Joycelyn Das, MD  [facility] 07/10/2023  If 7PM-7AM, please contact night-coverage

## 2023-07-10 NOTE — Progress Notes (Signed)
 Echocardiogram 2D Echocardiogram has been performed.  Desiree Barber 07/10/2023, 6:02 PM

## 2023-07-10 NOTE — Consult Note (Signed)
 Reason for Consult:abd pain Referring Physician: Dr. Leland Her AIRA SALLADE is an 73 y.o. female.  HPI: The patient is a 73 year old white female who started having upper abdominal pain about a week ago.  She stayed at home with this pain for couple days before going to [facility].  At that time she was found to have an mild bowel obstruction on scan and an NG tube was placed.  During her stay at [facility] she had a repeat plain film done a couple days later that showed some improvement of her bowel gas pattern.  For some reason she was then transferred to [facility] now 6 days after her presentation for a bowel obstruction.  On interviewing her today she denies any abdominal pain.  She is hungry and wants to eat.  She has had several bowel movements today.  Her CT scan done 2 days ago shows oral contrast throughout the colon  Past Medical History:  Diagnosis Date   Anemia    Anxiety    Occasional   Arthritis    COPD (chronic obstructive pulmonary disease) (HCC)    HTN (hypertension)    Iron deficiency anemia due to chronic blood loss 03/28/2022   Peripheral arterial disease (HCC)     Past Surgical History:  Procedure Laterality Date   ABDOMINAL AORTOGRAM W/LOWER EXTREMITY N/A 04/10/2022   Procedure: ABDOMINAL AORTOGRAM W/LOWER EXTREMITY;  Surgeon: Nada Libman, MD;  [facility]: [facility];  Service: Cardiovascular;  Laterality: N/A;   BIOPSY  08/27/2022   Procedure: BIOPSY;  Surgeon: Lanelle Bal, DO;  Location: [facility];  Service: Endoscopy;;   BIOPSY  03/25/2023   Procedure: BIOPSY;  Surgeon: Lanelle Bal, DO;  [facility]: [facility];  Service: Endoscopy;;   COLONOSCOPY  02/23/04   RMR: normal rectum and colon.minimal internal hemorrhoids   COLONOSCOPY WITH PROPOFOL N/A 06/13/2015   Procedure: COLONOSCOPY WITH PROPOFOL;  Surgeon: Corbin Ade, MD;  [facility]: [facility];  Service: Endoscopy;  Laterality: N/A;  1045   COLONOSCOPY WITH  PROPOFOL N/A 08/27/2022   Procedure: COLONOSCOPY WITH PROPOFOL;  Surgeon: Lanelle Bal, DO;  [facility];  Service: Endoscopy;  Laterality: N/A;  8:45 am, asa 3   ENTEROSCOPY N/A 03/25/2023   Procedure: ENTEROSCOPY;  Surgeon: Lanelle Bal, DO;  [facility];  Service: Endoscopy;  Laterality: N/A;  115pm, asa 3   ESOPHAGOGASTRODUODENOSCOPY (EGD) WITH PROPOFOL N/A 08/27/2022   Procedure: ESOPHAGOGASTRODUODENOSCOPY (EGD) WITH PROPOFOL;  Surgeon: Lanelle Bal, DO;  [facility];  Service: Endoscopy;  Laterality: N/A;   GIVENS CAPSULE STUDY N/A 01/29/2023   Procedure: GIVENS CAPSULE STUDY;  Surgeon: Lanelle Bal, DO;  [facility];  Service: Endoscopy;  Laterality: N/A;  730am   HOT HEMOSTASIS  08/27/2022   Procedure: HOT HEMOSTASIS (ARGON PLASMA COAGULATION/BICAP);  Surgeon: Lanelle Bal, DO;  [facility];  Service: Endoscopy;;   HOT HEMOSTASIS  03/25/2023   Procedure: HOT HEMOSTASIS (ARGON PLASMA COAGULATION/BICAP);  Surgeon: Lanelle Bal, DO;  [facility];  Service: Endoscopy;;   POLYPECTOMY  06/13/2015   Procedure: POLYPECTOMY;  Surgeon: Corbin Ade, MD;  [facility];  Service: Endoscopy;;  sigmoid colon polyp   TOOTH EXTRACTION     TRIGGER FINGER RELEASE Bilateral    TUBAL LIGATION      Family History  Problem Relation Age of Onset   Colon cancer Neg Hx  Breast cancer Neg Hx     Social History:  reports that she quit smoking about 2 years ago. Her smoking use included cigarettes. She started smoking about 57 years ago. She has a 27.5 pack-year smoking history. She has been exposed to tobacco smoke. She has quit using smokeless tobacco. She reports current alcohol use. She reports that she does not use drugs.  Allergies:  Allergies  Allergen Reactions   Demerol [Meperidine] Nausea And Vomiting   Oxycontin [Oxycodone Hcl] Nausea And Vomiting   Fish Allergy Nausea Only    Only  fish from [place]   Lavender Oil Itching    And hives   Other Itching    Flower oils-gets hives too    Medications: I have reviewed the patient's current medications.  Results for orders placed or performed during the hospital encounter of 07/06/23 (from the past 48 hours)  Blood gas, arterial     Status: Abnormal   Collection Time: 07/08/23  3:16 PM  Result Value Ref Range   pH, Arterial 7.35 7.35 - 7.45   pCO2 arterial 51 (H) 32 - 48 mmHg   pO2, Arterial 79 (L) 83 - 108 mmHg   Bicarbonate 28.2 (H) 20.0 - 28.0 mmol/L   Acid-Base Excess 1.6 0.0 - 2.0 mmol/L   O2 Saturation 97.1 %   Patient temperature 37.1    Collection site LEFT RADIAL    Drawn by [id number]    Allens test (pass/fail) PASS PASS    Comment: Performed at [facility], [address], [place], [place] [address]  Lactic acid, plasma     Status: None   Collection Time: 07/08/23  4:33 PM  Result Value Ref Range   Lactic Acid, Venous 1.5 0.5 - 1.9 mmol/L    Comment: Performed at [facility], [address] [place], [place] [address]  Comprehensive metabolic panel     Status: Abnormal   Collection Time: 07/08/23  4:33 PM  Result Value Ref Range   Sodium 143 135 - 145 mmol/L   Potassium 4.0 3.5 - 5.1 mmol/L   Chloride 103 98 - 111 mmol/L   CO2 26 22 - 32 mmol/L   Glucose, Bld 144 (H) 70 - 99 mg/dL    Comment: Glucose reference range applies only to samples taken after fasting for at least 8 hours.   BUN 26 (H) 8 - 23 mg/dL   Creatinine, Ser 9.14 (H) 0.44 - 1.00 mg/dL   Calcium 8.9 8.9 - 78.2 mg/dL   Total Protein 6.8 6.5 - 8.1 g/dL   Albumin 3.0 (L) 3.5 - 5.0 g/dL   AST 9,562 (H) 15 - 41 U/L   ALT 852 (H) 0 - 44 U/L   Alkaline Phosphatase 153 (H) 38 - 126 U/L   Total Bilirubin 1.1 0.0 - 1.2 mg/dL   GFR, Estimated 43 (L) >60 mL/min    Comment: (NOTE) Calculated using the CKD-EPI Creatinine Equation (2021)    Anion gap 14 5 - 15    Comment: Performed at [facility], [address] [place], [place]  [address]  Brain natriuretic peptide     Status: Abnormal   Collection Time: 07/08/23  4:33 PM  Result Value Ref Range   B Natriuretic Peptide 236.0 (H) 0.0 - 100.0 pg/mL    Comment: Performed at [facility], [address], [place], [place] [address]  CBC     Status: Abnormal   Collection Time: 07/08/23  4:33 PM  Result Value Ref Range   WBC  10.3 4.0 - 10.5 K/uL   RBC 3.79 (L) 3.87 - 5.11 MIL/uL   Hemoglobin 13.1 12.0 - 15.0 g/dL   HCT 09.8 11.9 - 14.7 %   MCV 111.9 (H) 80.0 - 100.0 fL   MCH 34.6 (H) 26.0 - 34.0 pg   MCHC 30.9 30.0 - 36.0 g/dL   RDW 82.9 56.2 - 13.0 %   Platelets 385 150 - 400 K/uL   nRBC 0.0 0.0 - 0.2 %    Comment: Performed at [facility], [address], [place], [place] [address]  Comprehensive metabolic panel     Status: Abnormal   Collection Time: 07/08/23  9:08 PM  Result Value Ref Range   Sodium 143 135 - 145 mmol/L   Potassium 3.8 3.5 - 5.1 mmol/L   Chloride 104 98 - 111 mmol/L   CO2 25 22 - 32 mmol/L   Glucose, Bld 154 (H) 70 - 99 mg/dL    Comment: Glucose reference range applies only to samples taken after fasting for at least 8 hours.   BUN 30 (H) 8 - 23 mg/dL   Creatinine, Ser 4.69 (H) 0.44 - 1.00 mg/dL   Calcium 8.7 (L) 8.9 - 10.3 mg/dL   Total Protein 6.6 6.5 - 8.1 g/dL   Albumin 3.0 (L) 3.5 - 5.0 g/dL   AST 6,295 (H) 15 - 41 U/L    Comment: RESULTS CONFIRMED BY MANUAL DILUTION   ALT 1,819 (H) 0 - 44 U/L   Alkaline Phosphatase 149 (H) 38 - 126 U/L   Total Bilirubin 1.0 0.0 - 1.2 mg/dL   GFR, Estimated 43 (L) >60 mL/min    Comment: (NOTE) Calculated using the CKD-EPI Creatinine Equation (2021)    Anion gap 14 5 - 15    Comment: Performed at [facility], [address], [place], [place] [address]  Protime-INR     Status: Abnormal   Collection Time: 07/08/23  9:08 PM  Result Value Ref Range   Prothrombin Time 17.0 (H) 11.4 - 15.2 seconds   INR 1.4 (H) 0.8 - 1.2    Comment: (NOTE) INR goal varies based on device and disease  states. Performed at [facility], [address], [place], [place] [address]   Lactic acid, plasma     Status: None   Collection Time: 07/08/23  9:08 PM  Result Value Ref Range   Lactic Acid, Venous 1.2 0.5 - 1.9 mmol/L    Comment: Performed at [facility], [address], [place], [place] [address]  Blood gas, arterial     Status: Abnormal   Collection Time: 07/08/23  9:40 PM  Result Value Ref Range   pH, Arterial 7.38 7.35 - 7.45   pCO2 arterial 54 (H) 32 - 48 mmHg   pO2, Arterial 79 (L) 83 - 108 mmHg   Bicarbonate 31.9 (H) 20.0 - 28.0 mmol/L   Acid-Base Excess 5.3 (H) 0.0 - 2.0 mmol/L   O2 Saturation 97.5 %   Patient temperature 37.0    Collection site LEFT BRACHIAL    Drawn by [id number]    Allens test (pass/fail) PASS PASS    Comment: Performed at [facility], [address], [place], [place] [address]  CBC     Status: Abnormal   Collection Time: 07/09/23  3:49 AM  Result Value Ref Range   WBC 11.1 (H) 4.0 - 10.5 K/uL   RBC 3.80 (L) 3.87 - 5.11 MIL/uL   Hemoglobin 13.0 12.0 - 15.0 g/dL   HCT 25.9 56.3 - 87.5 %  MCV 108.7 (H) 80.0 - 100.0 fL   MCH 34.2 (H) 26.0 - 34.0 pg   MCHC 31.5 30.0 - 36.0 g/dL   RDW 78.2 95.6 - 21.3 %   Platelets 339 150 - 400 K/uL   nRBC 0.0 0.0 - 0.2 %    Comment: Performed at [facility], [address], [place], [place] [address]  Comprehensive metabolic panel with GFR     Status: Abnormal   Collection Time: 07/09/23  3:49 AM  Result Value Ref Range   Sodium 148 (H) 135 - 145 mmol/L   Potassium 3.7 3.5 - 5.1 mmol/L   Chloride 108 98 - 111 mmol/L   CO2 27 22 - 32 mmol/L   Glucose, Bld 136 (H) 70 - 99 mg/dL    Comment: Glucose reference range applies only to samples taken after fasting for at least 8 hours.   BUN 27 (H) 8 - 23 mg/dL   Creatinine, Ser 8.46 (H) 0.44 - 1.00 mg/dL   Calcium 8.7 (L) 8.9 - 10.3 mg/dL   Total Protein 6.5 6.5 - 8.1 g/dL   Albumin 2.8 (L) 3.5 - 5.0 g/dL   AST 9,629 (H) 15 - 41 U/L    Comment: RESULT CONFIRMED  BY MANUAL DILUTION   ALT 2,028 (H) 0 - 44 U/L   Alkaline Phosphatase 111 38 - 126 U/L   Total Bilirubin 0.8 0.0 - 1.2 mg/dL   GFR, Estimated 44 (L) >60 mL/min    Comment: (NOTE) Calculated using the CKD-EPI Creatinine Equation (2021)    Anion gap 13 5 - 15    Comment: Performed at [facility], [address] [place], [place] [address]  Magnesium     Status: None   Collection Time: 07/09/23  3:49 AM  Result Value Ref Range   Magnesium 2.4 1.7 - 2.4 mg/dL    Comment: Performed at [facility], [address], [place], [place] [address]  Phosphorus     Status: None   Collection Time: 07/09/23  3:49 AM  Result Value Ref Range   Phosphorus 3.9 2.5 - 4.6 mg/dL    Comment: Performed at [facility], [address] [place], [place] [address]  Hepatitis panel, acute     Status: None   Collection Time: 07/09/23 10:27 AM  Result Value Ref Range   Hepatitis B Surface Ag NON REACTIVE NON REACTIVE   HCV Ab NON REACTIVE NON REACTIVE    Comment: (NOTE) Nonreactive HCV antibody screen is consistent with no HCV infections,  unless recent infection is suspected or other evidence exists to indicate HCV infection.     Hep A IgM NON REACTIVE NON REACTIVE   Hep B C IgM NON REACTIVE NON REACTIVE    Comment: Performed at [facility], [address], [place], [place] [address]  CBC     Status: Abnormal   Collection Time: 07/10/23  6:47 AM  Result Value Ref Range   WBC 12.6 (H) 4.0 - 10.5 K/uL   RBC 3.81 (L) 3.87 - 5.11 MIL/uL   Hemoglobin 12.8 12.0 - 15.0 g/dL   HCT 40.3 47.4 - 25.9 %   MCV 110.2 (H) 80.0 - 100.0 fL   MCH 33.6 26.0 - 34.0 pg   MCHC 30.5 30.0 - 36.0 g/dL   RDW 56.3 87.5 - 64.3 %   Platelets 367 150 - 400 K/uL   nRBC 0.0 0.0 - 0.2 %    Comment: Performed at [facility]  [facility], [address], [place], [place] [address]  Comprehensive metabolic panel with GFR     Status: Abnormal   Collection Time: 07/10/23  6:47 AM  Result Value Ref Range   Sodium  146 (H) 135 - 145 mmol/L   Potassium 3.0 (L) 3.5 - 5.1 mmol/L   Chloride 107 98 - 111 mmol/L   CO2 32 22 - 32 mmol/L   Glucose, Bld 92 70 - 99 mg/dL    Comment: Glucose reference range applies only to samples taken after fasting for at least 8 hours.   BUN 26 (H) 8 - 23 mg/dL   Creatinine, Ser 9.62 0.44 - 1.00 mg/dL   Calcium 8.4 (L) 8.9 - 10.3 mg/dL   Total Protein 5.9 (L) 6.5 - 8.1 g/dL   Albumin 2.7 (L) 3.5 - 5.0 g/dL   AST 952 (H) 15 - 41 U/L   ALT 1,681 (H) 0 - 44 U/L   Alkaline Phosphatase 108 38 - 126 U/L   Total Bilirubin 0.8 0.0 - 1.2 mg/dL   GFR, Estimated >84 >13 mL/min    Comment: (NOTE) Calculated using the CKD-EPI Creatinine Equation (2021)    Anion gap 7 5 - 15    Comment: Performed at [facility], [address], [place], [place] [address]  Magnesium     Status: None   Collection Time: 07/10/23  6:47 AM  Result Value Ref Range   Magnesium 2.3 1.7 - 2.4 mg/dL    Comment: Performed at [facility], [address], [place], [place] [address]  Phosphorus     Status: None   Collection Time: 07/10/23  6:47 AM  Result Value Ref Range   Phosphorus 2.9 2.5 - 4.6 mg/dL    Comment: Performed at [facility], [address], [place], [place] [address]    CT HEAD WO CONTRAST ( ) Result Date: 07/08/2023 CLINICAL DATA:  Mental status change, unknown cause EXAM: CT HEAD WITHOUT CONTRAST TECHNIQUE: Contiguous axial images were obtained from the base of the skull through the vertex without intravenous contrast. RADIATION DOSE REDUCTION: This exam was performed according to the departmental dose-optimization program which includes automated exposure control, adjustment of the mA and/or kV according to patient size and/or use of iterative reconstruction technique. COMPARISON:  MRI head May 17, 22. FINDINGS: Brain: No evidence of acute infarction, hemorrhage, hydrocephalus, extra-axial collection or mass lesion/mass effect. Patchy white matter hypodensities are  nonspecific but compatible with chronic microvascular ischemic disease. Vascular: No hyperdense vessel identified. Calcific atherosclerosis. Skull: No acute fracture. Sinuses/Orbits: Clear sinuses.  No acute findings. Other: No mastoid effusions. IMPRESSION: 1. No evidence of acute intracranial abnormality. 2. Chronic microvascular ischemic change. Electronically Signed   By: Stevenson Elbe M.D.   On: 07/08/2023 22:55   CT VENOGRAM ABD/PEL Result Date: 07/08/2023 CLINICAL DATA:  Altered mental status with abdominal pain. EXAM: CT ABDOMEN WITHOUT CONTRAST CT PELVIS WITH CONTRAST TECHNIQUE: Multidetector Multidetector CT imaging of the pelvis was performed using the venogram protocol following the bolus administration of intravenous contrast. RADIATION DOSE REDUCTION: This exam was performed according to the departmental dose-optimization program which includes automated exposure control, adjustment of the mA and/or kV according to patient size and/or use of iterative reconstruction technique. CONTRAST:  50mL OMNIPAQUE IOHEXOL 350 MG/ML SOLN COMPARISON:  Lower chest: There is some tree-in-bud opacities in the right middle lobe, unchanged. There is dependent atelectasis in the lower lobes. Hepatobiliary: Gallbladder is dilated. The gallstone is present measuring 2.2  cm. There is no biliary ductal dilatation. Appendix is unchanged from the prior examination. They hypodense lesion in the posterior right lobe of the liver measures 11 mm and is unchanged. No new liver lesions are seen. Pancreas: Unremarkable. No pancreatic ductal dilatation or surrounding inflammatory changes. Spleen: Normal in size without focal abnormality. Adrenals/Urinary Tract: Adrenal glands are unremarkable. Kidneys are normal, without renal calculi, focal lesion, or hydronephrosis. Bladder is unremarkable. Stomach/Bowel: Oral contrast reaches the rectum. There are air-fluid levels throughout mildly dilated central small bowel loops. The  stomach is decompressed and the nasogastric tube tip is in the proximal body. The colon is nondilated. The appendix is not seen. Vascular/Lymphatic: Aorta and IVC are normal in size. No evidence for IVC thrombosis. There are atherosclerotic calcifications of the aorta. Superior mesenteric vein and portal veins is here patent is can be seen. Splenic vein is grossly patent as well. Reproductive: Uterus and bilateral adnexa are unremarkable. Other: There is trace free fluid in the pelvis. There is no abdominal wall hernia the Musculoskeletal: Degenerative changes affect the spine. IMPRESSION: 1. No evidence for IVC thrombosis or portal venous thrombosis. 2. Mildly dilated central small bowel loops with air-fluid levels. Findings may represent ileus or enteritis. Low-grade partial small bowel obstruction is not excluded. Oral contrast reaches the colon. 3. Stable indeterminate 11 mm hypodense lesion in the right lobe of the liver. Please see MRI report from 07/08/2023. A 4. Part cholelithiasis with dilated gallbladder. Correlate clinically for acute cholecystitis. 5. Trace free fluid in the pelvis. 6. Stable tree-in-bud opacities in the right middle lobe. 7. Aortic atherosclerosis. Electronically Signed   By: Darliss Cheney M.D.   On: 07/08/2023 22:55   DG Abd 1 View Result Date: 07/08/2023 CLINICAL DATA:  Small bowel obstruction EXAM: ABDOMEN - 1 VIEW COMPARISON:  Abdominal x-ray 07/08/2023 FINDINGS: Enteric tube tip is in the body of the stomach. Dilated air-filled small bowel loops are again seen in the central abdomen measuring up to 4.5 cm. Degree of dilatation has mildly decreased. Contrast is seen throughout nondilated colon and within the bladder. Lung bases are clear. No acute fractures. IMPRESSION: Mild decrease in small bowel dilatation. Small bowel obstruction persists. Contrast is seen throughout nondilated colon. Electronically Signed   By: Darliss Cheney M.D.   On: 07/08/2023 21:58   CT Angio Chest  Pulmonary Embolism (PE) W or WO Contrast Result Date: 07/08/2023 CLINICAL DATA:  Elevated D-dimer level with shortness of breath, abdominal pain, nausea, and weakness. EXAM: CT ANGIOGRAPHY CHEST WITH CONTRAST TECHNIQUE: Multidetector CT imaging of the chest was performed using the standard protocol during bolus administration of intravenous contrast. Multiplanar CT image reconstructions and MIPs were obtained to evaluate the vascular anatomy. RADIATION DOSE REDUCTION: This exam was performed according to the departmental dose-optimization program which includes automated exposure control, adjustment of the mA and/or kV according to patient size and/or use of iterative reconstruction technique. CONTRAST:  75mL OMNIPAQUE IOHEXOL 350 MG/ML SOLN COMPARISON:  04/08/2023 and chest radiograph from 07/08/2023 FINDINGS: Cardiovascular: No filling defect is identified in the pulmonary arterial tree to suggest pulmonary embolus. Coronary, aortic arch, and branch vessel atherosclerotic vascular disease. Mild right heart prominence. Mediastinum/Nodes: Nonspecific fullness of the gastroesophageal junction. Possible small type 1 hiatal hernia. A nasogastric/orogastric tube is present extending into the stomach. No pathologic adenopathy. Lungs/Pleura: Trace bilateral pleural effusions with passive atelectasis. Biapical pleuroparenchymal scarring. Emphysema. Airway plugging in volume loss in the right middle lobe and inferiorly in the anterior right upper lobe. Mild airway  plugging in the lingula. Some of the right upper lobe nodularity shown previously on 04/08/2023 has resolved but other areas of nodularity mostly reflecting mucous plugging are observed. These are likely inflammatory. Upper Abdomen: Abdominal aortic atherosclerosis. Musculoskeletal: Lower cervical plate and screw fixator. 40% superior endplate compression fracture at T6 is likely acute or subacute given the associated sclerosis, and was not present on 04/08/2023.  No significant posterior bony retropulsion. Review of the MIP images confirms the above findings. IMPRESSION: 1. No filling defect is identified in the pulmonary arterial tree to suggest pulmonary embolus. 2. 40% superior endplate compression fracture at T6 is likely acute or subacute given the associated sclerosis, and was not present on 04/08/2023. No significant posterior bony retropulsion. 3. Trace bilateral pleural effusions with passive atelectasis. 4. Airway plugging and volume loss in the right middle lobe and inferiorly in the anterior right upper lobe. Mild airway plugging in the lingula. Some of the right upper lobe nodularity shown previously on 04/08/2023 has resolved but other areas of nodularity mostly reflecting mucous plugging are observed. These are likely inflammatory. 5. Nonspecific fullness of the gastroesophageal junction. Possible small type 1 hiatal hernia. 6.  Aortic Atherosclerosis (ICD10-I70.0).  Coronary atherosclerosis. Electronically Signed   By: Gaylyn Rong M.D.   On: 07/08/2023 21:00   DG Abd Portable 1V-Small Bowel Obstruction Protocol-initial, 8 hr delay Result Date: 07/08/2023 CLINICAL DATA:  8 hour delay small-bowel obstruction protocol assessment EXAM: PORTABLE ABDOMEN - 1 VIEW COMPARISON:  07/08/2023 at 8:04 a.m. FINDINGS: Nasogastric/orogastric tube noted with side port at the gastroesophageal junction and distal tip in the vicinity of the stomach body. Contrast medium is visualized in dilated loops of small bowel in the lower abdomen, these loops measure up to 4 cm. There also some gas-filled loops more cephalad measuring up to 4.7 cm, which is similar to the earlier exam. Right lower quadrant contrast is outline by apparent haustral margins favoring cecal contrast. Moreover, there is some contrast in what appears to be a small caliber descending colon, favoring colonic extension. IMPRESSION: 1. Contrast medium is visualized in dilated loops of small bowel in the  lower abdomen, these loops measure up to 4 cm. There also some gas-filled loops more cephalad measuring up to 4.7 cm, which is similar to the earlier exam. 2. Right lower quadrant contrast is outline by apparent haustral margins favoring cecal contrast. Moreover, there is some contrast in what appears to be a small caliber descending colon, favoring colonic extension. Overall the appearance is strongly suggestive of colonic contrast medium favoring partial small bowel obstruction or small-bowel ileus as a cause for the small bowel dilatation. 3. Nasogastric/orogastric tube noted with side port at the gastroesophageal junction and distal tip in the vicinity of the stomach body. Electronically Signed   By: Gaylyn Rong M.D.   On: 07/08/2023 20:49   DG CHEST PORT 1 VIEW Result Date: 07/08/2023 CLINICAL DATA:  Shortness of breath EXAM: PORTABLE CHEST 1 VIEW COMPARISON:  07/06/2023 FINDINGS: NG tube is in the stomach. Heart and mediastinal contours are within normal limits and stable. Aortic atherosclerosis. No confluent airspace opacities or effusions. No acute bony abnormality. IMPRESSION: No active disease. Electronically Signed   By: Charlett Nose M.D.   On: 07/08/2023 19:29    Review of Systems  Constitutional: Negative.   HENT: Negative.    Eyes: Negative.   Respiratory: Negative.    Cardiovascular: Negative.   Gastrointestinal:  Positive for abdominal pain.  Endocrine: Negative.   Genitourinary: Negative.  Musculoskeletal: Negative.   Skin: Negative.   Allergic/Immunologic: Negative.   Neurological: Negative.   Hematological: Negative.   Psychiatric/Behavioral: Negative.     Blood pressure (!) 151/77, pulse 74, temperature 97.6 F (36.4 C), temperature source Oral, resp. rate 16, height 5\' 4"  (1.626 m), weight 60.4 kg, SpO2 94%. Physical Exam Vitals reviewed.  Constitutional:      General: She is not in acute distress.    Appearance: Normal appearance.  HENT:     Head:  Normocephalic and atraumatic.     Right Ear: External ear normal.     Left Ear: External ear normal.     Nose: Nose normal.     Mouth/Throat:     Mouth: Mucous membranes are moist.     Pharynx: Oropharynx is clear.  Eyes:     Extraocular Movements: Extraocular movements intact.     Conjunctiva/sclera: Conjunctivae normal.     Pupils: Pupils are equal, round, and reactive to light.  Cardiovascular:     Rate and Rhythm: Normal rate and regular rhythm.     Pulses: Normal pulses.     Heart sounds: Normal heart sounds.  Pulmonary:     Effort: Pulmonary effort is normal. No respiratory distress.     Breath sounds: Normal breath sounds.  Abdominal:     General: Abdomen is flat. Bowel sounds are normal.     Palpations: Abdomen is soft.     Tenderness: There is no abdominal tenderness.  Musculoskeletal:        General: No swelling or deformity. Normal range of motion.     Cervical back: Normal range of motion and neck supple.  Skin:    General: Skin is warm and dry.     Coloration: Skin is not jaundiced.  Neurological:     General: No focal deficit present.     Mental Status: She is alert and oriented to person, place, and time.  Psychiatric:        Mood and Affect: Mood normal.        Behavior: Behavior normal.     Assessment/Plan: The patient initially presented with a small bowel obstruction that seems to be resolving.  At this point since she still has her NG tube and has not been studied in a couple days we will start her on the small bowel protocol.  If the contrast makes it to the colon then we will clamp her NG tube and start slowly advancing her diet.  We will follow her closely with you.  Lillette Reid III 07/10/2023, 2:34 PM

## 2023-07-11 ENCOUNTER — Inpatient Hospital Stay (HOSPITAL_COMMUNITY)

## 2023-07-11 ENCOUNTER — Ambulatory Visit: Admitting: Cardiology

## 2023-07-11 DIAGNOSIS — K56609 Unspecified intestinal obstruction, unspecified as to partial versus complete obstruction: Secondary | ICD-10-CM | POA: Diagnosis not present

## 2023-07-11 LAB — COMPREHENSIVE METABOLIC PANEL WITH GFR
ALT: 1257 U/L — ABNORMAL HIGH (ref 0–44)
AST: 420 U/L — ABNORMAL HIGH (ref 15–41)
Albumin: 2.6 g/dL — ABNORMAL LOW (ref 3.5–5.0)
Alkaline Phosphatase: 83 U/L (ref 38–126)
Anion gap: 11 (ref 5–15)
BUN: 15 mg/dL (ref 8–23)
CO2: 29 mmol/L (ref 22–32)
Calcium: 8.2 mg/dL — ABNORMAL LOW (ref 8.9–10.3)
Chloride: 103 mmol/L (ref 98–111)
Creatinine, Ser: 0.78 mg/dL (ref 0.44–1.00)
GFR, Estimated: >60 mL/min (ref >60)
Glucose, Bld: 75 mg/dL (ref 70–99)
Potassium: 3.7 mmol/L (ref 3.5–5.1)
Sodium: 143 mmol/L (ref 135–145)
Total Bilirubin: 0.8 mg/dL (ref 0.0–1.2)
Total Protein: 5.6 g/dL — ABNORMAL LOW (ref 6.5–8.1)

## 2023-07-11 LAB — CBC
HCT: 41.3 % (ref 36.0–46.0)
Hemoglobin: 12.7 g/dL (ref 12.0–15.0)
MCH: 33.9 pg (ref 26.0–34.0)
MCHC: 30.8 g/dL (ref 30.0–36.0)
MCV: 110.1 fL — ABNORMAL HIGH (ref 80.0–100.0)
Platelets: 395 10*3/uL (ref 150–400)
RBC: 3.75 MIL/uL — ABNORMAL LOW (ref 3.87–5.11)
RDW: 13.1 % (ref 11.5–15.5)
WBC: 13.4 10*3/uL — ABNORMAL HIGH (ref 4.0–10.5)
nRBC: 0 % (ref 0.0–0.2)

## 2023-07-11 LAB — PHOSPHORUS: Phosphorus: 3.2 mg/dL (ref 2.5–4.6)

## 2023-07-11 LAB — MAGNESIUM: Magnesium: 2.1 mg/dL (ref 1.7–2.4)

## 2023-07-11 MED ORDER — ONDANSETRON HCL 4 MG/2ML IJ SOLN: 4.0000 mg | Freq: Four times a day (QID) | INTRAMUSCULAR | Status: DC | PRN

## 2023-07-11 MED ORDER — SENNOSIDES 8.8 MG/5ML PO SYRP
10.0000 mL | ORAL_SOLUTION | Freq: Two times a day (BID) | ORAL | Status: DC
  Filled 2023-07-11: qty 10

## 2023-07-11 MED ORDER — ACETAMINOPHEN 650 MG RE SUPP: 650.0000 mg | Freq: Four times a day (QID) | RECTAL | Status: DC | PRN

## 2023-07-11 MED ORDER — ACETAMINOPHEN 160 MG/5ML PO SOLN: 650.0000 mg | Freq: Four times a day (QID) | ORAL | Status: DC | PRN

## 2023-07-11 MED ORDER — ONDANSETRON HCL 4 MG PO TABS: 4.0000 mg | ORAL_TABLET | Freq: Four times a day (QID) | ORAL | Status: DC | PRN

## 2023-07-11 NOTE — Progress Notes (Signed)
 Subjective/Chief Complaint: No complaints.  Hungry and wants to eat   Objective: Vital signs in last 24 hours: Temp:  [97.6 F (36.4 C)-98.1 F (36.7 C)] 98.1 F (36.7 C) (04/17 0536) Pulse Rate:  [85-94] 86 (04/17 0833) Resp:  [16-19] 19 (04/17 0833) BP: (153-159)/(73-81) 159/78 (04/17 0833) SpO2:  [96 %-99 %] 96 % (04/17 0833) Last BM Date : 07/10/23  Intake/Output from previous day: 04/16 0701 - 04/17 0700 In: -  Out: 2850 [Emesis/NG output:2850] Intake/Output this shift: No intake/output data recorded.  General appearance: alert and cooperative Resp: clear to auscultation bilaterally Cardio: regular rate and rhythm GI: Soft and nontender  Lab Results:  Recent Labs    07/09/23 0349 07/10/23 0647  WBC 11.1* 12.6*  HGB 13.0 12.8  HCT 41.3 42.0  PLT 339 367   BMET Recent Labs    07/09/23 0349 07/10/23 0647  NA 148* 146*  K 3.7 3.0*  CL 108 107  CO2 27 32  GLUCOSE 136* 92  BUN 27* 26*  CREATININE 1.29* 0.96  CALCIUM 8.7* 8.4*   PT/INR Recent Labs    07/08/23 2108  LABPROT 17.0*  INR 1.4*   ABG Recent Labs    07/08/23 1516 07/08/23 2140  PHART 7.35 7.38  HCO3 28.2* 31.9*    Studies/Results: DG Abd Portable 1V-Small Bowel Obstruction Protocol-initial, 8 hr delay Result Date: 07/11/2023 CLINICAL DATA:  8 hour small-bowel follow up EXAM: PORTABLE ABDOMEN - 1 VIEW COMPARISON:  None Available. FINDINGS: Administered contrast now lies throughout the colon. A few mildly dilated loops of small bowel are noted in the right lower quadrant. No free air is seen. IMPRESSION: Mild persistent small bowel dilatation. Electronically Signed   By: Alcide Clever M.D.   On: 07/11/2023 01:49   ECHOCARDIOGRAM COMPLETE Result Date: 07/10/2023    ECHOCARDIOGRAM REPORT   Patient Name:   IZZABELLE BOULEY Date of Exam: 07/10/2023 Medical Rec #:  [id number]         Height:       64.0 in Accession #:    [id number]        Weight:       133.1 lb Date of Birth:  1950-10-13          BSA:          1.645 m Patient Age:    72 years          BP:           151/77 mmHg Patient Gender: F                 HR:           89 bpm. Exam Location:  Inpatient Procedure: 2D Echo, Cardiac Doppler and Color Doppler (Both Spectral and Color            Flow Doppler were utilized during procedure). Indications:    Dyspnea R06.00  History:        Patient has prior history of Echocardiogram examinations, most                 recent 07/25/2019. CAD, PAD and COPD, Signs/Symptoms:Dyspnea; Risk                 Factors:Hypertension and Former Smoker.  Sonographer:    Lucendia Herrlich RCS Referring Phys: Rebekah Chesterfield POKHREL IMPRESSIONS  1. Left ventricular ejection fraction, by estimation, is 70 to 75%. The left ventricle has hyperdynamic function. The left ventricle has no regional wall motion abnormalities. There  is mild concentric left ventricular hypertrophy. Left ventricular diastolic parameters are consistent with Grade I diastolic dysfunction (impaired relaxation).  2. Right ventricular systolic function is moderately reduced. The right ventricular size is normal. Tricuspid regurgitation signal is inadequate for assessing PA pressure.  3. A small pericardial effusion is present. The pericardial effusion is anterior to the right ventricle. There is no evidence of cardiac tamponade.  4. The mitral valve is normal in structure. Trivial mitral valve regurgitation. No evidence of mitral stenosis.  5. The aortic valve is tricuspid. There is moderate calcification of the aortic valve. Aortic valve regurgitation is not visualized. Aortic valve sclerosis/calcification is present, without any evidence of aortic stenosis.  6. The inferior vena cava is dilated in size with >50% respiratory variability, suggesting right atrial pressure of 8 mmHg. FINDINGS  Left Ventricle: Left ventricular ejection fraction, by estimation, is 70 to 75%. The left ventricle has hyperdynamic function. The left ventricle has no regional wall motion  abnormalities. The left ventricular internal cavity size was normal in size. There is mild concentric left ventricular hypertrophy. Left ventricular diastolic parameters are consistent with Grade I diastolic dysfunction (impaired relaxation). Right Ventricle: The right ventricular size is normal. No increase in right ventricular wall thickness. Right ventricular systolic function is moderately reduced. Tricuspid regurgitation signal is inadequate for assessing PA pressure. Left Atrium: Left atrial size was normal in size. Right Atrium: Right atrial size was normal in size. Pericardium: A small pericardial effusion is present. The pericardial effusion is anterior to the right ventricle. There is no evidence of cardiac tamponade. Mitral Valve: The mitral valve is normal in structure. Mild mitral annular calcification. Trivial mitral valve regurgitation. No evidence of mitral valve stenosis. Tricuspid Valve: The tricuspid valve is normal in structure. Tricuspid valve regurgitation is trivial. No evidence of tricuspid stenosis. Aortic Valve: The aortic valve is tricuspid. There is moderate calcification of the aortic valve. Aortic valve regurgitation is not visualized. Aortic valve sclerosis/calcification is present, without any evidence of aortic stenosis. Aortic valve mean gradient measures 7.0 mmHg. Aortic valve peak gradient measures 13.5 mmHg. Aortic valve area, by VTI measures 1.64 cm. Pulmonic Valve: The pulmonic valve was normal in structure. Pulmonic valve regurgitation is trivial. No evidence of pulmonic stenosis. Aorta: The aortic root is normal in size and structure. Venous: The inferior vena cava is dilated in size with greater than 50% respiratory variability, suggesting right atrial pressure of 8 mmHg. IAS/Shunts: No atrial level shunt detected by color flow Doppler.  LEFT VENTRICLE PLAX 2D LVOT diam:     1.80 cm   Diastology LV SV:         55        LV e' medial:    11.10 cm/s LV SV Index:   33         LV E/e' medial:  7.1 LVOT Area:     2.54 cm  LV e' lateral:   8.38 cm/s                          LV E/e' lateral: 9.4  RIGHT VENTRICLE             IVC RV S prime:     11.50 cm/s  IVC diam: 1.75 cm TAPSE (M-mode): 1.5 cm LEFT ATRIUM             Index        RIGHT ATRIUM  Index LA Vol (A2C):   25.5 ml 15.50 ml/m  RA Area:     8.34 cm LA Vol (A4C):   20.5 ml 12.46 ml/m  RA Volume:   13.40 ml 8.14 ml/m LA Biplane Vol: 24.4 ml 14.83 ml/m  AORTIC VALVE AV Area (Vmax):    1.54 cm AV Area (Vmean):   1.51 cm AV Area (VTI):     1.64 cm AV Vmax:           184.00 cm/s AV Vmean:          121.000 cm/s AV VTI:            0.335 m AV Peak Grad:      13.5 mmHg AV Mean Grad:      7.0 mmHg LVOT Vmax:         111.00 cm/s LVOT Vmean:        71.800 cm/s LVOT VTI:          0.216 m LVOT/AV VTI ratio: 0.64  AORTA Ao Root diam: 2.80 cm Ao Asc diam:  2.70 cm MITRAL VALVE MV Area (PHT): 3.65 cm     SHUNTS MV Decel Time: 208 msec     Systemic VTI:  0.22 m MV E velocity: 78.60 cm/s   Systemic Diam: 1.80 cm MV A velocity: 145.00 cm/s MV E/A ratio:  0.54 Jules Oar MD Electronically signed by Jules Oar MD Signature Date/Time: 07/10/2023/6:05:58 PM    Final     Anti-infectives: Anti-infectives (From admission, onward)    Start     Dose/Rate Route Frequency Ordered Stop   07/07/23 0200  piperacillin-tazobactam (ZOSYN) IVPB 3.375 g        3.375 g 12.5 mL/hr over 240 Minutes Intravenous Every 8 hours 07/06/23 2132     07/06/23 1730  piperacillin-tazobactam (ZOSYN) IVPB 3.375 g        3.375 g 100 mL/hr over 30 Minutes Intravenous  Once 07/06/23 1715 07/06/23 1846       Assessment/Plan: s/p * No surgery found * Abdominal x-rays show contrast in the colon.  She still has some mildly dilated loops of small bowel. At this point we will try clamping her NG tube and offer her clears.  We will monitor her progress closely.  LOS: 5 days    Lillette Reid III 07/11/2023

## 2023-07-11 NOTE — Progress Notes (Signed)
 Mobility Specialist Progress Note:   07/11/23 1225  Mobility  Activity Ambulated with assistance in hallway  Level of Assistance Minimal assist, patient does 75% or more  Assistive Device None (hand rails)  Distance Ambulated (ft) 65 ft  Activity Response Tolerated fair  Mobility Referral Yes  Mobility visit 1 Mobility  Mobility Specialist Start Time (ACUTE ONLY) 1130  Mobility Specialist Stop Time (ACUTE ONLY) 1155  Mobility Specialist Time Calculation (min) (ACUTE ONLY) 25 min   During Mobility: 105 HR , 84-90% SpO2 3 L Post Mobility: 87 HR ,95% SpO2 2.5 L  Pt received in bed, agreeable to mobility. MinA bed mobility. CG during ambulation. Seated rest break required d/t dizziness and fatigue. Pt desat to 84% but with pursed lip breathing elevated to 90% on 3 L. Pt displayed unsteady gait d/t dizziness but no obvert LOB present. Pt left in chair asymptomatic with call bell in reach and all needs met. VSS. RN notified.    Desiree Barber  Mobility Specialist Please contact via [employer] office at [phone]

## 2023-07-11 NOTE — Progress Notes (Signed)
 PROGRESS NOTE  [name]:[id number] DOB: 24-Dec-1950 DOA: 07/06/2023 PCP: Minus Amel, MD   LOS: 5 days   Brief narrative:  Desiree Barber is a 73 y.o. female with past medical history significant for chronic respiratory failure on as needed O2, COPD, hypertension, peripheral artery disease presented to hospital with abdominal pain and distention for 3 days with some nausea and dry heaving.  In the ED, patient was slightly tachycardic.  CT abdomen and pelvis with contrast-small bowel obstruction with transition point in the distal ileum in the right lower quadrant.  Mesenteric edema present.  No pneumatosis or free air.  Cholelithiasis with distended gallbladder.  General surgery was consulted and patient was started on NG tube IV fluids and Plavix was on hold.  Patient was then admitted hospital for further evaluation and treatment.    Assessment/Plan: Principal Problem:   SBO (small bowel obstruction) (HCC) Active Problems:   SIRS (systemic inflammatory response syndrome) (HCC)   Acute respiratory failure with hypoxia (HCC)   HTN (hypertension)   Chronic respiratory failure with hypoxia (HCC)   Liver lesion   COPD (chronic obstructive pulmonary disease) (HCC)   PAD (peripheral artery disease) (HCC)  Acute respiratory failure- With history of underlying COPD,  Continue supportive care.  Received 1 dose of IV Lasix Solu-Medrol DuoNebs.  Continue incentive spirometry.  Currently on 2 L of oxygen by nasal cannula.  Possibility of infiltrate in the lungs.  Continue with antibiotics.  CT angiogram of the chest was negative for PE but trace pleural effusion and atelectasis with airway plugging and volume loss in the middle lobe and inferiorly.  Will add incentive spirometry and flutter valve.   2D echocardiogram showed LV ejection fraction of 70 to 75% with LVH and grade 1 diastolic dysfunction.  Concern for altered mental status.  Resolved.  CT head scan was negative for acute  findings for chronic microvascular changes.  CT venogram without any IVC thrombosis or portal vein thrombosis.  Dilated small bowel loops with air-fluid levels.  Small bowel obstruction.  Continue n.p.o. NG tube IV fluids antiemetics and supportive care.  General surgery on board and plan for NG tube clamping today with clear trial.  Contrast in the colon.  Patient feels hungry.  Elevated LFTs, could be related to infections, shock liver Elevated AST ALT total bilirubin within normal limits.  AST ALT has significantly trended up with downtrend.  CMP pending from today.  Will continue to monitor LFTs..  Acute hepatitis panel is negative.  Right upper quadrant MRI, CT scan with fatty liver.  No biliary obstruction.  No lactic acidosis.  Will avoid hypotension.  SIRS (systemic inflammatory response syndrome)  Improved.  On Zosyn empirically.  Blood cultures negative in 4 days. Temperature max of 98.1.  F, pending CBC  Hypokalemia.  Potassium of 3.0 on 07/10/2023.  Received 60 mill equivalent of IV potassium supplements.  Check levels in AM.  PAD (peripheral artery disease)  Continue to hold Plavix.  Hypophosphatemia.  Replenished and improved.  Mild hypernatremia.  Likely secondary to volume depletion.  Continue with hydration.  Check CMP from today.  Elevated creatinine likely mild acute kidney injury.  Creatinine on 07/10/2023 at 0.9 from at 1.2.  On hydration.    COPD (chronic obstructive pulmonary disease) (HCC) Continue bronchodilators.  Currently on 3 L of oxygen by nasal cannula.  Liver lesion CT showed indeterminate hypodensity.  MRI of the liver with distended gallbladder, fatty liver possible late enhancing hemangioma.  HTN (hypertension)  Will add as needed antihypertensives.  Will avoid for now.  DVT prophylaxis: heparin injection 5,000 Units Start: 07/08/23 1615 SCDs Start: 07/08/23 1520 Place TED hose Start: 07/08/23 1520 SCDs Start: 07/06/23 2215   Disposition:    Likely home in 1 to 2 days pending clinical improvement  Status is: Inpatient  Remains inpatient appropriate because: Pending clinical improvement, concern for bowel obstruction, AKI, elevated LFTs,    Code Status:     Code Status: Full Code  Family Communication: Spoke with the patient's spouse at bedside  Consultants: General Surgery  Procedures: NG tube placement  Anti-infectives:  Zosyn IV  Anti-infectives (From admission, onward)    Start     Dose/Rate Route Frequency Ordered Stop   07/07/23 0200  piperacillin-tazobactam (ZOSYN) IVPB 3.375 g        3.375 g 12.5 mL/hr over 240 Minutes Intravenous Every 8 hours 07/06/23 2132     07/06/23 1730  piperacillin-tazobactam (ZOSYN) IVPB 3.375 g        3.375 g 100 mL/hr over 30 Minutes Intravenous  Once 07/06/23 1715 07/06/23 1846        Subjective: Today, patient was seen and examined at bedside.  Patient states that she feels hungry.  Denies any nausea vomiting abdominal pain.  Has had bowel movements.  Still good good NG output  Objective: Vitals:   07/11/23 0536 07/11/23 0833  BP: (!) 154/73 (!) 159/78  Pulse: 85 86  Resp: 17 19  Temp: 98.1 F (36.7 C)   SpO2: 96% 96%    Intake/Output Summary (Last 24 hours) at 07/11/2023 1022 Last data filed at 07/11/2023 1610 Gross per 24 hour  Intake --  Output 2850 ml  Net -2850 ml   Filed Weights   07/06/23 1546 07/06/23 2150  Weight: 60 kg 60.4 kg   Body mass index is 22.85 kg/m.   Physical Exam: GENERAL: Patient is alert awake and oriented. Not in obvious distress.  On nasal cannula oxygen, NG tube in place with some dark aspirate. HENT: No scleral pallor or icterus. Pupils equally reactive to light. Oral mucosa is moist NECK: is supple, no gross swelling noted. CHEST: Clear to auscultation.  No overt wheezing or crackles. CVS: S1 and S2 heard, no murmur. Regular rate and rhythm.  ABDOMEN: Soft, nontender, bowel sounds are present. EXTREMITIES: No  edema. CNS: Cranial nerves are intact. No focal motor deficits. SKIN: warm and dry without rashes.  Data Review: I have personally reviewed the following laboratory data and studies,  CBC: Recent Labs  Lab 07/06/23 1622 07/07/23 0550 07/08/23 0545 07/08/23 1633 07/09/23 0349 07/10/23 0647 07/11/23 0929  WBC 16.1*   < > 9.5 10.3 11.1* 12.6* 13.4*  NEUTROABS 14.2*  --   --   --   --   --   --   HGB 14.2   < > 12.1 13.1 13.0 12.8 12.7  HCT 43.9   < > 39.9 42.4 41.3 42.0 41.3  MCV 107.9*   < > 110.8* 111.9* 108.7* 110.2* 110.1*  PLT 328   < > 317 385 339 367 395   < > = values in this interval not displayed.   Basic Metabolic Panel: Recent Labs  Lab 07/08/23 0545 07/08/23 1633 07/08/23 2108 07/09/23 0349 07/10/23 0647 07/11/23 0929  NA 142 143 143 148* 146* 143  K 3.9 4.0 3.8 3.7 3.0* 3.7  CL 105 103 104 108 107 103  CO2 30 26 25 27  32 29  GLUCOSE 108* 144* 154* 136*  92 75  BUN 15 26* 30* 27* 26* 15  CREATININE 0.59 1.31* 1.31* 1.29* 0.96 0.78  CALCIUM 8.5* 8.9 8.7* 8.7* 8.4* 8.2*  MG 2.1  --   --  2.4 2.3 2.1  PHOS 2.3*  --   --  3.9 2.9 3.2   Liver Function Tests: Recent Labs  Lab 07/08/23 1633 07/08/23 2108 07/09/23 0349 07/10/23 0647 07/11/23 0929  AST 1,074* 2,912* 2,432* 968* 420*  ALT 852* 1,819* 2,028* 1,681* 1,257*  ALKPHOS 153* 149* 111 108 83  BILITOT 1.1 1.0 0.8 0.8 0.8  PROT 6.8 6.6 6.5 5.9* 5.6*  ALBUMIN 3.0* 3.0* 2.8* 2.7* 2.6*   Recent Labs  Lab 07/06/23 1622  LIPASE 24   No results for input(s): "AMMONIA" in the last 168 hours. Cardiac Enzymes: No results for input(s): "CKTOTAL", "CKMB", "CKMBINDEX", "TROPONINI" in the last 168 hours. BNP (last 3 results) Recent Labs    06/27/23 1121 07/08/23 1633  BNP 34.0 236.0*    ProBNP (last 3 results) No results for input(s): "PROBNP" in the last 8760 hours.  CBG: No results for input(s): "GLUCAP" in the last 168 hours. Recent Results (from the past 240 hours)  Culture, blood (Routine  X 2) w Reflex to ID Panel     Status: None (Preliminary result)   Collection Time: 07/07/23  5:50 AM   Specimen: BLOOD LEFT FOREARM  Result Value Ref Range Status   Specimen Description BLOOD LEFT FOREARM  Final   Special Requests BOTTLES DRAWN AEROBIC AND ANAEROBIC  Final   Culture   Final    NO GROWTH 4 DAYS Performed at [facility], [address], [place], [place] [address]    Report Status PENDING  Incomplete  Culture, blood (Routine X 2) w Reflex to ID Panel     Status: None (Preliminary result)   Collection Time: 07/07/23  5:56 AM   Specimen: BLOOD  Result Value Ref Range Status   Specimen Description BLOOD BLOOD LEFT HAND  Final   Special Requests BOTTLES DRAWN AEROBIC AND ANAEROBIC  Final   Culture   Final    NO GROWTH 4 DAYS Performed at [facility], [address], [place], [place] [address]    Report Status PENDING  Incomplete     Studies: DG Abd Portable 1V-Small Bowel Obstruction Protocol-initial, 8 hr delay Result Date: 07/11/2023 CLINICAL DATA:  8 hour small-bowel follow up EXAM: PORTABLE ABDOMEN - 1 VIEW COMPARISON:  None Available. FINDINGS: Administered contrast now lies throughout the colon. A few mildly dilated loops of small bowel are noted in the right lower quadrant. No free air is seen. IMPRESSION: Mild persistent small bowel dilatation. Electronically Signed   By: Alcide Clever M.D.   On: 07/11/2023 01:49   ECHOCARDIOGRAM COMPLETE Result Date: 07/10/2023    ECHOCARDIOGRAM REPORT   Patient Name:   Desiree Barber Date of Exam: 07/10/2023 Medical Rec #:  [id number]         Height:       64.0 in Accession #:    [id number]        Weight:       133.1 lb Date of Birth:  11-Oct-1950         BSA:          1.645 m Patient Age:    72 years          BP:           151/77 mmHg Patient Gender: F  HR:           89 bpm. Exam Location:  Inpatient Procedure: 2D Echo, Cardiac Doppler and Color Doppler (Both Spectral and Color            Flow Doppler were utilized  during procedure). Indications:    Dyspnea R06.00  History:        Patient has prior history of Echocardiogram examinations, most                 recent 07/25/2019. CAD, PAD and COPD, Signs/Symptoms:Dyspnea; Risk                 Factors:Hypertension and Former Smoker.  Sonographer:    Terrilee Few RCS Referring Phys: Anagha Loseke IMPRESSIONS  1. Left ventricular ejection fraction, by estimation, is 70 to 75%. The left ventricle has hyperdynamic function. The left ventricle has no regional wall motion abnormalities. There is mild concentric left ventricular hypertrophy. Left ventricular diastolic parameters are consistent with Grade I diastolic dysfunction (impaired relaxation).  2. Right ventricular systolic function is moderately reduced. The right ventricular size is normal. Tricuspid regurgitation signal is inadequate for assessing PA pressure.  3. A small pericardial effusion is present. The pericardial effusion is anterior to the right ventricle. There is no evidence of cardiac tamponade.  4. The mitral valve is normal in structure. Trivial mitral valve regurgitation. No evidence of mitral stenosis.  5. The aortic valve is tricuspid. There is moderate calcification of the aortic valve. Aortic valve regurgitation is not visualized. Aortic valve sclerosis/calcification is present, without any evidence of aortic stenosis.  6. The inferior vena cava is dilated in size with >50% respiratory variability, suggesting right atrial pressure of 8 mmHg. FINDINGS  Left Ventricle: Left ventricular ejection fraction, by estimation, is 70 to 75%. The left ventricle has hyperdynamic function. The left ventricle has no regional wall motion abnormalities. The left ventricular internal cavity size was normal in size. There is mild concentric left ventricular hypertrophy. Left ventricular diastolic parameters are consistent with Grade I diastolic dysfunction (impaired relaxation). Right Ventricle: The right ventricular size is  normal. No increase in right ventricular wall thickness. Right ventricular systolic function is moderately reduced. Tricuspid regurgitation signal is inadequate for assessing PA pressure. Left Atrium: Left atrial size was normal in size. Right Atrium: Right atrial size was normal in size. Pericardium: A small pericardial effusion is present. The pericardial effusion is anterior to the right ventricle. There is no evidence of cardiac tamponade. Mitral Valve: The mitral valve is normal in structure. Mild mitral annular calcification. Trivial mitral valve regurgitation. No evidence of mitral valve stenosis. Tricuspid Valve: The tricuspid valve is normal in structure. Tricuspid valve regurgitation is trivial. No evidence of tricuspid stenosis. Aortic Valve: The aortic valve is tricuspid. There is moderate calcification of the aortic valve. Aortic valve regurgitation is not visualized. Aortic valve sclerosis/calcification is present, without any evidence of aortic stenosis. Aortic valve mean gradient measures 7.0 mmHg. Aortic valve peak gradient measures 13.5 mmHg. Aortic valve area, by VTI measures 1.64 cm. Pulmonic Valve: The pulmonic valve was normal in structure. Pulmonic valve regurgitation is trivial. No evidence of pulmonic stenosis. Aorta: The aortic root is normal in size and structure. Venous: The inferior vena cava is dilated in size with greater than 50% respiratory variability, suggesting right atrial pressure of 8 mmHg. IAS/Shunts: No atrial level shunt detected by color flow Doppler.  LEFT VENTRICLE PLAX 2D LVOT diam:     1.80 cm   Diastology LV SV:  55        LV e' medial:    11.10 cm/s LV SV Index:   33        LV E/e' medial:  7.1 LVOT Area:     2.54 cm  LV e' lateral:   8.38 cm/s                          LV E/e' lateral: 9.4  RIGHT VENTRICLE             IVC RV S prime:     11.50 cm/s  IVC diam: 1.75 cm TAPSE (M-mode): 1.5 cm LEFT ATRIUM             Index        RIGHT ATRIUM          Index LA  Vol (A2C):   25.5 ml 15.50 ml/m  RA Area:     8.34 cm LA Vol (A4C):   20.5 ml 12.46 ml/m  RA Volume:   13.40 ml 8.14 ml/m LA Biplane Vol: 24.4 ml 14.83 ml/m  AORTIC VALVE AV Area (Vmax):    1.54 cm AV Area (Vmean):   1.51 cm AV Area (VTI):     1.64 cm AV Vmax:           184.00 cm/s AV Vmean:          121.000 cm/s AV VTI:            0.335 m AV Peak Grad:      13.5 mmHg AV Mean Grad:      7.0 mmHg LVOT Vmax:         111.00 cm/s LVOT Vmean:        71.800 cm/s LVOT VTI:          0.216 m LVOT/AV VTI ratio: 0.64  AORTA Ao Root diam: 2.80 cm Ao Asc diam:  2.70 cm MITRAL VALVE MV Area (PHT): 3.65 cm     SHUNTS MV Decel Time: 208 msec     Systemic VTI:  0.22 m MV E velocity: 78.60 cm/s   Systemic Diam: 1.80 cm MV A velocity: 145.00 cm/s MV E/A ratio:  0.54 Jules Oar MD Electronically signed by Jules Oar MD Signature Date/Time: 07/10/2023/6:05:58 PM    Final       Rosena Conradi, MD  [facility] 07/11/2023  If 7PM-7AM, please contact night-coverage

## 2023-07-12 ENCOUNTER — Inpatient Hospital Stay (HOSPITAL_COMMUNITY)

## 2023-07-12 DIAGNOSIS — K56609 Unspecified intestinal obstruction, unspecified as to partial versus complete obstruction: Secondary | ICD-10-CM | POA: Diagnosis not present

## 2023-07-12 LAB — MAGNESIUM: Magnesium: 2 mg/dL (ref 1.7–2.4)

## 2023-07-12 LAB — CBC
HCT: 38.5 % (ref 36.0–46.0)
Hemoglobin: 12 g/dL (ref 12.0–15.0)
MCH: 33.8 pg (ref 26.0–34.0)
MCHC: 31.2 g/dL (ref 30.0–36.0)
MCV: 108.5 fL — ABNORMAL HIGH (ref 80.0–100.0)
Platelets: 371 10*3/uL (ref 150–400)
RBC: 3.55 MIL/uL — ABNORMAL LOW (ref 3.87–5.11)
RDW: 13 % (ref 11.5–15.5)
WBC: 11.7 10*3/uL — ABNORMAL HIGH (ref 4.0–10.5)
nRBC: 0 % (ref 0.0–0.2)

## 2023-07-12 LAB — PHOSPHORUS: Phosphorus: 2.9 mg/dL (ref 2.5–4.6)

## 2023-07-12 LAB — COMPREHENSIVE METABOLIC PANEL WITH GFR
ALT: 942 U/L — ABNORMAL HIGH (ref 0–44)
AST: 192 U/L — ABNORMAL HIGH (ref 15–41)
Albumin: 2.5 g/dL — ABNORMAL LOW (ref 3.5–5.0)
Alkaline Phosphatase: 77 U/L (ref 38–126)
Anion gap: 8 (ref 5–15)
BUN: 8 mg/dL (ref 8–23)
CO2: 31 mmol/L (ref 22–32)
Calcium: 8.3 mg/dL — ABNORMAL LOW (ref 8.9–10.3)
Chloride: 102 mmol/L (ref 98–111)
Creatinine, Ser: 0.65 mg/dL (ref 0.44–1.00)
GFR, Estimated: >60 mL/min (ref >60)
Glucose, Bld: 105 mg/dL — ABNORMAL HIGH (ref 70–99)
Potassium: 3.3 mmol/L — ABNORMAL LOW (ref 3.5–5.1)
Sodium: 141 mmol/L (ref 135–145)
Total Bilirubin: 0.9 mg/dL (ref 0.0–1.2)
Total Protein: 5.4 g/dL — ABNORMAL LOW (ref 6.5–8.1)

## 2023-07-12 LAB — CULTURE, BLOOD (ROUTINE X 2)
Culture: NO GROWTH
Culture: NO GROWTH

## 2023-07-12 MED ORDER — SENNOSIDES-DOCUSATE SODIUM 8.6-50 MG PO TABS: 1.0000 | ORAL_TABLET | Freq: Every evening | ORAL | Status: DC | PRN

## 2023-07-12 MED ORDER — POTASSIUM CHLORIDE 10 MEQ/100ML IV SOLN
10.0000 meq | INTRAVENOUS | Status: AC
Start: 2023-07-12 — End: 2023-07-12
  Administered 2023-07-12 (×2): 10 meq via INTRAVENOUS
  Filled 2023-07-12 (×2): qty 100

## 2023-07-12 MED ORDER — POTASSIUM CHLORIDE CRYS ER 20 MEQ PO TBCR
40.0000 meq | EXTENDED_RELEASE_TABLET | Freq: Once | ORAL | Status: AC
  Administered 2023-07-12: 40 meq via ORAL
  Filled 2023-07-12: qty 2

## 2023-07-12 NOTE — Plan of Care (Signed)

## 2023-07-12 NOTE — Plan of Care (Signed)

## 2023-07-12 NOTE — Progress Notes (Addendum)
 PROGRESS NOTE  KEEVA REISEN FMW:[id number] DOB: Jul 10, 1950 DOA: 07/06/2023 PCP: Marvine Rush, MD   LOS: 6 days   Brief narrative:  Desiree Barber is a 73 y.o. female with past medical history significant for chronic respiratory failure on as needed O2, COPD, hypertension, peripheral artery disease presented to hospital with abdominal pain and distention for 3 days with some nausea and dry heaving.  In the ED, patient was slightly tachycardic.  CT abdomen and pelvis with contrast-small bowel obstruction with transition point in the distal ileum in the right lower quadrant.  Mesenteric edema present.  No pneumatosis or free air.  Cholelithiasis with distended gallbladder.  General surgery was consulted and patient was started on NG tube IV fluids and Plavix  was on hold.  Patient was then admitted hospital for further evaluation and treatment.    Assessment/Plan: Principal Problem:   SBO (small bowel obstruction) (HCC) Active Problems:   SIRS (systemic inflammatory response syndrome) (HCC)   Acute respiratory failure with hypoxia (HCC)   HTN (hypertension)   Chronic respiratory failure with hypoxia (HCC)   Liver lesion   COPD (chronic obstructive pulmonary disease) (HCC)   PAD (peripheral artery disease) (HCC)  Acute respiratory failure- With history of underlying COPD,  Continue supportive care.  Received 1 dose of IV Lasix  Solu-Medrol  DuoNebs.  Continue incentive spirometry.  Currently on 2 L of oxygen  by nasal cannula.  Possibility of infiltrate in the lungs.  Continue with antibiotics complete the course..  CT angiogram of the chest was negative for PE but trace pleural effusion and atelectasis with airway plugging and volume loss in the middle lobe and inferiorly.  Continue incentive spirometry and flutter valve.   2D echocardiogram showed LV ejection fraction of 70 to 75% with LVH and grade 1 diastolic dysfunction.  Wean off oxygen  as able.  Concern for altered mental status.   Resolved.  CT head scan was negative for acute findings for chronic microvascular changes.  CT venogram without any IVC thrombosis or portal vein thrombosis.  Dilated small bowel loops with air-fluid levels.  Small bowel obstruction.  Continue n.p.o. NG tube IV fluids antiemetics and supportive care.  General surgery on board and and patient has been discontinued of NG tube and has been started on clears.  Contrast in the colon.  Follow surgical recommendation.  Elevated LFTs, could be related to infections, shock liver Elevated AST ALT total bilirubin within normal limits.  AST ALT has significantly down trended.  C  Acute hepatitis panel is negative.  Right upper quadrant MRI, CT scan with fatty liver.  No biliary obstruction.  No lactic acidosis.  Will avoid hypotension.   sepsis secondary to pneumonia Improved.  Patient was tachycardic tachypneic with leukocytosis and possible infiltrate in the lungs.  On Zosyn  empirically.  Blood cultures negative in 4 days. Temperature max of 98.8.  F, has mild leukocytosis.    Hypokalemia.  Mild at 3.3 today.  Will replace through IV KCl 40 mill equivalents.  Check levels in AM.  PAD (peripheral artery disease)  Continue to hold Plavix .  Hypophosphatemia.  Replenished and improved.  Latest phosphorus of 2.9.  Mild hypernatremia.  Likely secondary to volume depletion.  Improved.  Latest sodium of 141.  Elevated creatinine likely mild acute kidney injury.  Creatinine on 07/10/2023 at 0.9 from at 1.2.  On hydration.    COPD (chronic obstructive pulmonary disease) (HCC) Continue bronchodilators.  Currently on 0.5 L of nasal cannula.  Liver lesion CT showed indeterminate hypodensity.  MRI of the liver with distended gallbladder, fatty liver possible late enhancing hemangioma.  HTN (hypertension) Will add as needed antihypertensives.  Will avoid for now.  DVT prophylaxis: heparin  injection 5,000 Units Start: 07/08/23 1615 SCDs Start: 07/08/23  1520 Place TED hose Start: 07/08/23 1520 SCDs Start: 07/06/23 2215   Disposition:   Likely home on 07/13/2023 if continues to improve, wean oxygen .  Status is: Inpatient  Remains inpatient appropriate because: Pending clinical improvement, concern for bowel obstruction, AKI, elevated LFTs, supplemental oxygen     Code Status:     Code Status: Full Code  Family Communication: Spoke with the patient's aunt at bedside.  Consultants: General Surgery  Procedures: NG tube placement and removal  Anti-infectives:  Zosyn  IV  Anti-infectives (From admission, onward)    Start     Dose/Rate Route Frequency Ordered Stop   07/07/23 0200  piperacillin -tazobactam (ZOSYN ) IVPB 3.375 g        3.375 g 12.5 mL/hr over 240 Minutes Intravenous Every 8 hours 07/06/23 2132     07/06/23 1730  piperacillin -tazobactam (ZOSYN ) IVPB 3.375 g        3.375 g 100 mL/hr over 30 Minutes Intravenous  Once 07/06/23 1715 07/06/23 1846        Subjective: Today, patient was seen and examined at bedside.  Patient states that the tube has come out and has been starting to drink some water .  Has had bowel movement.  Still on supplemental oxygen .  Objective: Vitals:   07/12/23 0528 07/12/23 0847  BP: (!) 153/71 (!) 148/75  Pulse: 84 80  Resp: 18 16  Temp: (!) 97.5 F (36.4 C) 98.4 F (36.9 C)  SpO2: 91% 96%    Intake/Output Summary (Last 24 hours) at 07/12/2023 0928 Last data filed at 07/11/2023 1200 Gross per 24 hour  Intake 240 ml  Output --  Net 240 ml   Filed Weights   07/06/23 1546 07/06/23 2150  Weight: 60 kg 60.4 kg   Body mass index is 22.85 kg/m.   Physical Exam: GENERAL: Patient is alert awake and oriented. Not in obvious distress.  On nasal cannula oxygen , off NG tube.   HENT: No scleral pallor or icterus. Pupils equally reactive to light. Oral mucosa is moist NECK: is supple, no gross swelling noted. CHEST: Clear to auscultation.  No overt wheezing or crackles. CVS: S1 and S2  heard, no murmur. Regular rate and rhythm.  ABDOMEN: Soft, nontender, bowel sounds are present. EXTREMITIES: No edema. CNS: Cranial nerves are intact. No focal motor deficits. SKIN: warm and dry without rashes.  Data Review: I have personally reviewed the following laboratory data and studies,  CBC: Recent Labs  Lab 07/06/23 1622 07/07/23 0550 07/08/23 1633 07/09/23 0349 07/10/23 0647 07/11/23 0929 07/12/23 0341  WBC 16.1*   < > 10.3 11.1* 12.6* 13.4* 11.7*  NEUTROABS 14.2*  --   --   --   --   --   --   HGB 14.2   < > 13.1 13.0 12.8 12.7 12.0  HCT 43.9   < > 42.4 41.3 42.0 41.3 38.5  MCV 107.9*   < > 111.9* 108.7* 110.2* 110.1* 108.5*  PLT 328   < > 385 339 367 395 371   < > = values in this interval not displayed.   Basic Metabolic Panel: Recent Labs  Lab 07/08/23 0545 07/08/23 1633 07/08/23 2108 07/09/23 0349 07/10/23 0647 07/11/23 0929 07/12/23 0341  NA 142   < > 143 148* 146* 143 141  K 3.9   < > 3.8 3.7 3.0* 3.7 3.3*  CL 105   < > 104 108 107 103 102  CO2 30   < > 25 27 32 29 31  GLUCOSE 108*   < > 154* 136* 92 75 105*  BUN 15   < > 30* 27* 26* 15 8  CREATININE 0.59   < > 1.31* 1.29* 0.96 0.78 0.65  CALCIUM  8.5*   < > 8.7* 8.7* 8.4* 8.2* 8.3*  MG 2.1  --   --  2.4 2.3 2.1 2.0  PHOS 2.3*  --   --  3.9 2.9 3.2 2.9   < > = values in this interval not displayed.   Liver Function Tests: Recent Labs  Lab 07/08/23 2108 07/09/23 0349 07/10/23 0647 07/11/23 0929 07/12/23 0341  AST 2,912* 2,432* 968* 420* 192*  ALT 1,819* 2,028* 1,681* 1,257* 942*  ALKPHOS 149* 111 108 83 77  BILITOT 1.0 0.8 0.8 0.8 0.9  PROT 6.6 6.5 5.9* 5.6* 5.4*  ALBUMIN 3.0* 2.8* 2.7* 2.6* 2.5*   Recent Labs  Lab 07/06/23 1622  LIPASE 24   No results for input(s): AMMONIA in the last 168 hours. Cardiac Enzymes: No results for input(s): CKTOTAL, CKMB, CKMBINDEX, TROPONINI in the last 168 hours. BNP (last 3 results) Recent Labs    06/27/23 1121 07/08/23 1633  BNP  34.0 236.0*    ProBNP (last 3 results) No results for input(s): PROBNP in the last 8760 hours.  CBG: No results for input(s): GLUCAP in the last 168 hours. Recent Results (from the past 240 hours)  Culture, blood (Routine X 2) w Reflex to ID Panel     Status: None   Collection Time: 07/07/23  5:50 AM   Specimen: BLOOD LEFT FOREARM  Result Value Ref Range Status   Specimen Description BLOOD LEFT FOREARM  Final   Special Requests BOTTLES DRAWN AEROBIC AND ANAEROBIC  Final   Culture   Final    NO GROWTH 5 DAYS Performed at [facility], [address] [place], [place] [address]    Report Status 07/12/2023 FINAL  Final  Culture, blood (Routine X 2) w Reflex to ID Panel     Status: None   Collection Time: 07/07/23  5:56 AM   Specimen: BLOOD  Result Value Ref Range Status   Specimen Description BLOOD BLOOD LEFT HAND  Final   Special Requests BOTTLES DRAWN AEROBIC AND ANAEROBIC  Final   Culture   Final    NO GROWTH 5 DAYS Performed at [facility], [address], [place], [place] [address]    Report Status 07/12/2023 FINAL  Final     Studies: DG Abd 1 View Result Date: 07/12/2023 CLINICAL DATA:  [id number].  Bowel obstruction. EXAM: ABDOMEN - 1 VIEW COMPARISON:  Portable abdomen yesterday at 01:32 a.m. FINDINGS: 5:24 a.m. Enteric contrast has partially cleared from the large intestine in the interval with scattered dilute contrast remaining throughout the colon. There is no appreciable contrast in the small bowel. There is little if any change in right lower quadrant small bowel dilatation up to 3.6 cm. There is no supine evidence of free air. No other significant findings. 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 Lung bases are clear. IMPRESSION: 1. Enteric contrast has partially cleared from the large intestine in the interval with scattered dilute contrast remaining throughout the colon. 2. Little if any change in right lower quadrant small  bowel dilatation up to 3.6 cm. 3.  No supine evidence of free air. Electronically Signed   By: Francis Quam M.D.   On: 07/12/2023 08:04   DG Abd Portable 1V-Small Bowel Obstruction Protocol-initial, 8 hr delay Result Date: 07/11/2023 CLINICAL DATA:  8 hour small-bowel follow up EXAM: PORTABLE ABDOMEN - 1 VIEW COMPARISON:  None Available. FINDINGS: Administered contrast now lies throughout the colon. A few mildly dilated loops of small bowel are noted in the right lower quadrant. No free air is seen. IMPRESSION: Mild persistent small bowel dilatation. Electronically Signed   By: Oneil Devonshire M.D.   On: 07/11/2023 01:49   ECHOCARDIOGRAM COMPLETE Result Date: 07/10/2023    ECHOCARDIOGRAM REPORT   Patient Name:   AUNDRIA BITTERMAN Date of Exam: 07/10/2023 Medical Rec #:  [id number]         Height:       64.0 in Accession #:    [id number]        Weight:       133.1 lb Date of Birth:  [birth year]         BSA:          1.645 m Patient Age:    72 years          BP:           151/77 mmHg Patient Gender: F                 HR:           89 bpm. Exam Location:  Inpatient Procedure: 2D Echo, Cardiac Doppler and Color Doppler (Both Spectral and Color            Flow Doppler were utilized during procedure). Indications:    Dyspnea R06.00  History:        Patient has prior history of Echocardiogram examinations, most                 recent 07/25/2019. CAD, PAD and COPD, Signs/Symptoms:Dyspnea; Risk                 Factors:Hypertension and Former Smoker.  Sonographer:    Thea Norlander RCS Referring Phys: Soul Hackman IMPRESSIONS  1. Left ventricular ejection fraction, by estimation, is 70 to 75%. The left ventricle has hyperdynamic function. The left ventricle has no regional wall motion abnormalities. There is mild concentric left ventricular hypertrophy. Left ventricular diastolic parameters are consistent with Grade I diastolic dysfunction (impaired relaxation).  2. Right ventricular systolic function is moderately  reduced. The right ventricular size is normal. Tricuspid regurgitation signal is inadequate for assessing PA pressure.  3. A small pericardial effusion is present. The pericardial effusion is anterior to the right ventricle. There is no evidence of cardiac tamponade.  4. The mitral valve is normal in structure. Trivial mitral valve regurgitation. No evidence of mitral stenosis.  5. The aortic valve is tricuspid. There is moderate calcification of the aortic valve. Aortic valve regurgitation is not visualized. Aortic valve sclerosis/calcification is present, without any evidence of aortic stenosis.  6. The inferior vena cava is dilated in size with >50% respiratory variability, suggesting right atrial pressure of 8 mmHg. FINDINGS  Left Ventricle: Left ventricular ejection fraction, by estimation, is 70 to 75%. The left ventricle has hyperdynamic function. The left ventricle has no regional wall motion abnormalities. The left ventricular internal cavity size was normal in size. There is mild concentric left ventricular hypertrophy. Left ventricular diastolic parameters are consistent with Grade I diastolic dysfunction (impaired relaxation). Right Ventricle: The right ventricular  size is normal. No increase in right ventricular wall thickness. Right ventricular systolic function is moderately reduced. Tricuspid regurgitation signal is inadequate for assessing PA pressure. Left Atrium: Left atrial size was normal in size. Right Atrium: Right atrial size was normal in size. Pericardium: A small pericardial effusion is present. The pericardial effusion is anterior to the right ventricle. There is no evidence of cardiac tamponade. Mitral Valve: The mitral valve is normal in structure. Mild mitral annular calcification. Trivial mitral valve regurgitation. No evidence of mitral valve stenosis. Tricuspid Valve: The tricuspid valve is normal in structure. Tricuspid valve regurgitation is trivial. No evidence of tricuspid  stenosis. Aortic Valve: The aortic valve is tricuspid. There is moderate calcification of the aortic valve. Aortic valve regurgitation is not visualized. Aortic valve sclerosis/calcification is present, without any evidence of aortic stenosis. Aortic valve mean gradient measures 7.0 mmHg. Aortic valve peak gradient measures 13.5 mmHg. Aortic valve area, by VTI measures 1.64 cm. Pulmonic Valve: The pulmonic valve was normal in structure. Pulmonic valve regurgitation is trivial. No evidence of pulmonic stenosis. Aorta: The aortic root is normal in size and structure. Venous: The inferior vena cava is dilated in size with greater than 50% respiratory variability, suggesting right atrial pressure of 8 mmHg. IAS/Shunts: No atrial level shunt detected by color flow Doppler.  LEFT VENTRICLE PLAX 2D LVOT diam:     1.80 cm   Diastology LV SV:         55        LV e' medial:    11.10 cm/s LV SV Index:   33        LV E/e' medial:  7.1 LVOT Area:     2.54 cm  LV e' lateral:   8.38 cm/s                          LV E/e' lateral: 9.4  RIGHT VENTRICLE             IVC RV S prime:     11.50 cm/s  IVC diam: 1.75 cm TAPSE (M-mode): 1.5 cm LEFT ATRIUM             Index        RIGHT ATRIUM          Index LA Vol (A2C):   25.5 ml 15.50 ml/m  RA Area:     8.34 cm LA Vol (A4C):   20.5 ml 12.46 ml/m  RA Volume:   13.40 ml 8.14 ml/m LA Biplane Vol: 24.4 ml 14.83 ml/m  AORTIC VALVE AV Area (Vmax):    1.54 cm AV Area (Vmean):   1.51 cm AV Area (VTI):     1.64 cm AV Vmax:           184.00 cm/s AV Vmean:          121.000 cm/s AV VTI:            0.335 m AV Peak Grad:      13.5 mmHg AV Mean Grad:      7.0 mmHg LVOT Vmax:         111.00 cm/s LVOT Vmean:        71.800 cm/s LVOT VTI:          0.216 m LVOT/AV VTI ratio: 0.64  AORTA Ao Root diam: 2.80 cm Ao Asc diam:  2.70 cm MITRAL VALVE MV Area (PHT): 3.65 cm     SHUNTS MV Decel Time: 208 msec  Systemic VTI:  0.22 m MV E velocity: 78.60 cm/s   Systemic Diam: 1.80 cm MV A velocity: 145.00  cm/s MV E/A ratio:  0.54 Toribio Fuel MD Electronically signed by Toribio Fuel MD Signature Date/Time: 07/10/2023/6:05:58 PM    Final       Vernal Alstrom, MD  [facility] 07/12/2023  If 7PM-7AM, please contact night-coverage

## 2023-07-12 NOTE — Progress Notes (Signed)
 Assessment & Plan: HD#4 - small bowel obstruction  SB protocol with contrast to colon, some residual small bowel dilatation  Clear liquid diet (continue for now), NG out  Encouraged OOB, ambulation  Large BM this AM        Desiree Billow, MD [facility] practice Office: [phone]        Chief Complaint: SBO  Subjective: Patient in bed, tired.  Family at bedside.  Large BM this AM.  Tolerating clear liquids.  Objective: Vital signs in last 24 hours: Temp:  [97.5 F (36.4 C)-98.8 F (37.1 C)] 98.4 F (36.9 C) (04/18 0847) Pulse Rate:  [80-93] 80 (04/18 0847) Resp:  [16-19] 16 (04/18 0847) BP: (136-160)/(66-75) 148/75 (04/18 0847) SpO2:  [91 %-97 %] 96 % (04/18 0847) Last BM Date : 07/11/23  Intake/Output from previous day: 04/17 0701 - 04/18 0700 In: 240 [P.O.:240] Out: -  Intake/Output this shift: Total I/O In: 240 [P.O.:240] Out: -   Physical Exam: HEENT - sclerae clear, mucous membranes moist Abdomen - soft, mild distension; non-tender  Lab Results:  Recent Labs    07/11/23 0929 07/12/23 0341  WBC 13.4* 11.7*  HGB 12.7 12.0  HCT 41.3 38.5  PLT 395 371   BMET Recent Labs    07/11/23 0929 07/12/23 0341  NA 143 141  K 3.7 3.3*  CL 103 102  CO2 29 31  GLUCOSE 75 105*  BUN 15 8  CREATININE 0.78 0.65  CALCIUM  8.2* 8.3*   PT/INR No results for input(s): "LABPROT", "INR" in the last 72 hours. Comprehensive Metabolic Panel:    Component Value Date/Time   NA 141 07/12/2023 0341   NA 143 07/11/2023 0929   NA 140 06/27/2023 1121   K 3.3 (L) 07/12/2023 0341   K 3.7 07/11/2023 0929   CL 102 07/12/2023 0341   CL 103 07/11/2023 0929   CO2 31 07/12/2023 0341   CO2 29 07/11/2023 0929   BUN 8 07/12/2023 0341   BUN 15 07/11/2023 0929   BUN 8 06/27/2023 1121   CREATININE 0.65 07/12/2023 0341   CREATININE 0.78 07/11/2023 0929   GLUCOSE 105 (H) 07/12/2023 0341   GLUCOSE 75 07/11/2023 0929   CALCIUM  8.3 (L) 07/12/2023  0341   CALCIUM  8.2 (L) 07/11/2023 0929   AST 192 (H) 07/12/2023 0341   AST 420 (H) 07/11/2023 0929   ALT 942 (H) 07/12/2023 0341   ALT 1,257 (H) 07/11/2023 0929   ALKPHOS 77 07/12/2023 0341   ALKPHOS 83 07/11/2023 0929   BILITOT 0.9 07/12/2023 0341   BILITOT 0.8 07/11/2023 0929   PROT 5.4 (L) 07/12/2023 0341   PROT 5.6 (L) 07/11/2023 0929   ALBUMIN 2.5 (L) 07/12/2023 0341   ALBUMIN 2.6 (L) 07/11/2023 0929    Studies/Results: DG Abd 1 View Result Date: 07/12/2023 CLINICAL DATA:  [id number].  Bowel obstruction. EXAM: ABDOMEN - 1 VIEW COMPARISON:  Portable abdomen yesterday at 01:32 a.m. FINDINGS: 5:24 a.m. Enteric contrast has partially cleared from the large intestine in the interval with scattered dilute contrast remaining throughout the colon. There is no appreciable contrast in the small bowel. There is little if any change in right lower quadrant small bowel dilatation up to 3.6 cm. There is no supine evidence of free air. No other significant findings. 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 Lung bases are clear. IMPRESSION: 1. Enteric contrast has partially cleared from the large intestine in the interval with scattered dilute contrast remaining  throughout the colon. 2. Little if any change in right lower quadrant small bowel dilatation up to 3.6 cm. 3. No supine evidence of free air. Electronically Signed   By: Denman Fischer M.D.   On: 07/12/2023 08:04   DG Abd Portable 1V-Small Bowel Obstruction Protocol-initial, 8 hr delay Result Date: 07/11/2023 CLINICAL DATA:  8 hour small-bowel follow up EXAM: PORTABLE ABDOMEN - 1 VIEW COMPARISON:  None Available. FINDINGS: Administered contrast now lies throughout the colon. A few mildly dilated loops of small bowel are noted in the right lower quadrant. No free air is seen. IMPRESSION: Mild persistent small bowel dilatation. Electronically Signed   By: Violeta Grey M.D.   On: 07/11/2023 01:49    ECHOCARDIOGRAM COMPLETE Result Date: 07/10/2023    ECHOCARDIOGRAM REPORT   Patient Name:   MIYU FENDERSON Date of Exam: 07/10/2023 Medical Rec #:  [id number]         Height:       64.0 in Accession #:    [id number]        Weight:       133.1 lb Date of Birth:  05/15/1950         BSA:          1.645 m Patient Age:    72 years          BP:           151/77 mmHg Patient Gender: F                 HR:           89 bpm. Exam Location:  Inpatient Procedure: 2D Echo, Cardiac Doppler and Color Doppler (Both Spectral and Color            Flow Doppler were utilized during procedure). Indications:    Dyspnea R06.00  History:        Patient has prior history of Echocardiogram examinations, most                 recent 07/25/2019. CAD, PAD and COPD, Signs/Symptoms:Dyspnea; Risk                 Factors:Hypertension and Former Smoker.  Sonographer:    Terrilee Few RCS Referring Phys: LAXMAN POKHREL IMPRESSIONS  1. Left ventricular ejection fraction, by estimation, is 70 to 75%. The left ventricle has hyperdynamic function. The left ventricle has no regional wall motion abnormalities. There is mild concentric left ventricular hypertrophy. Left ventricular diastolic parameters are consistent with Grade I diastolic dysfunction (impaired relaxation).  2. Right ventricular systolic function is moderately reduced. The right ventricular size is normal. Tricuspid regurgitation signal is inadequate for assessing PA pressure.  3. A small pericardial effusion is present. The pericardial effusion is anterior to the right ventricle. There is no evidence of cardiac tamponade.  4. The mitral valve is normal in structure. Trivial mitral valve regurgitation. No evidence of mitral stenosis.  5. The aortic valve is tricuspid. There is moderate calcification of the aortic valve. Aortic valve regurgitation is not visualized. Aortic valve sclerosis/calcification is present, without any evidence of aortic stenosis.  6. The inferior vena cava is  dilated in size with >50% respiratory variability, suggesting right atrial pressure of 8 mmHg. FINDINGS  Left Ventricle: Left ventricular ejection fraction, by estimation, is 70 to 75%. The left ventricle has hyperdynamic function. The left ventricle has no regional wall motion abnormalities. The left ventricular internal cavity size was normal in size. There is mild concentric  left ventricular hypertrophy. Left ventricular diastolic parameters are consistent with Grade I diastolic dysfunction (impaired relaxation). Right Ventricle: The right ventricular size is normal. No increase in right ventricular wall thickness. Right ventricular systolic function is moderately reduced. Tricuspid regurgitation signal is inadequate for assessing PA pressure. Left Atrium: Left atrial size was normal in size. Right Atrium: Right atrial size was normal in size. Pericardium: A small pericardial effusion is present. The pericardial effusion is anterior to the right ventricle. There is no evidence of cardiac tamponade. Mitral Valve: The mitral valve is normal in structure. Mild mitral annular calcification. Trivial mitral valve regurgitation. No evidence of mitral valve stenosis. Tricuspid Valve: The tricuspid valve is normal in structure. Tricuspid valve regurgitation is trivial. No evidence of tricuspid stenosis. Aortic Valve: The aortic valve is tricuspid. There is moderate calcification of the aortic valve. Aortic valve regurgitation is not visualized. Aortic valve sclerosis/calcification is present, without any evidence of aortic stenosis. Aortic valve mean gradient measures 7.0 mmHg. Aortic valve peak gradient measures 13.5 mmHg. Aortic valve area, by VTI measures 1.64 cm. Pulmonic Valve: The pulmonic valve was normal in structure. Pulmonic valve regurgitation is trivial. No evidence of pulmonic stenosis. Aorta: The aortic root is normal in size and structure. Venous: The inferior vena cava is dilated in size with greater than  50% respiratory variability, suggesting right atrial pressure of 8 mmHg. IAS/Shunts: No atrial level shunt detected by color flow Doppler.  LEFT VENTRICLE PLAX 2D LVOT diam:     1.80 cm   Diastology LV SV:         55        LV e' medial:    11.10 cm/s LV SV Index:   33        LV E/e' medial:  7.1 LVOT Area:     2.54 cm  LV e' lateral:   8.38 cm/s                          LV E/e' lateral: 9.4  RIGHT VENTRICLE             IVC RV S prime:     11.50 cm/s  IVC diam: 1.75 cm TAPSE (M-mode): 1.5 cm LEFT ATRIUM             Index        RIGHT ATRIUM          Index LA Vol (A2C):   25.5 ml 15.50 ml/m  RA Area:     8.34 cm LA Vol (A4C):   20.5 ml 12.46 ml/m  RA Volume:   13.40 ml 8.14 ml/m LA Biplane Vol: 24.4 ml 14.83 ml/m  AORTIC VALVE AV Area (Vmax):    1.54 cm AV Area (Vmean):   1.51 cm AV Area (VTI):     1.64 cm AV Vmax:           184.00 cm/s AV Vmean:          121.000 cm/s AV VTI:            0.335 m AV Peak Grad:      13.5 mmHg AV Mean Grad:      7.0 mmHg LVOT Vmax:         111.00 cm/s LVOT Vmean:        71.800 cm/s LVOT VTI:          0.216 m LVOT/AV VTI ratio: 0.64  AORTA Ao Root diam: 2.80 cm Ao Asc  diam:  2.70 cm MITRAL VALVE MV Area (PHT): 3.65 cm     SHUNTS MV Decel Time: 208 msec     Systemic VTI:  0.22 m MV E velocity: 78.60 cm/s   Systemic Diam: 1.80 cm MV A velocity: 145.00 cm/s MV E/A ratio:  0.54 Jules Oar MD Electronically signed by Jules Oar MD Signature Date/Time: 07/10/2023/6:05:58 PM    Final       Desiree Barber 07/12/2023  Patient ID: Sharl Davies, female   DOB: December 31, 1950, 73 y.o.   MRN: [id number]

## 2023-07-12 NOTE — Progress Notes (Signed)
 Mobility Specialist Progress Note:   07/12/23 1125  Mobility  Activity Ambulated with assistance in hallway  Level of Assistance Contact guard assist, steadying assist  Assistive Device  (IV Pole)  Distance Ambulated (ft) 80 ft  Activity Response Tolerated well  Mobility Referral Yes  Mobility visit 1 Mobility  Mobility Specialist Start Time (ACUTE ONLY) 0925  Mobility Specialist Stop Time (ACUTE ONLY) 0945  Mobility Specialist Time Calculation (min) (ACUTE ONLY) 20 min   During Mobility: 101 HR , 85-93% SpO2 3 L  Pt received in bed, agreeable to mobility. Desat to 85% on 3 L. Pursed lip breathing encouraged. Pt c/o slight SOB and fatigue during ambulation. Denied any dizziness. Pt holding on to IV pole and hand rails but no unsteadiness or LOB present. VSS towards EOS. Pt left in bed with call bell in reach and all needs met.   Brown Husband  Mobility Specialist Please contact via [employer] office at [phone]

## 2023-07-13 DIAGNOSIS — K56609 Unspecified intestinal obstruction, unspecified as to partial versus complete obstruction: Secondary | ICD-10-CM | POA: Diagnosis not present

## 2023-07-13 MED ORDER — POTASSIUM CHLORIDE CRYS ER 20 MEQ PO TBCR
40.0000 meq | EXTENDED_RELEASE_TABLET | Freq: Once | ORAL | Status: AC
  Administered 2023-07-13: 40 meq via ORAL
  Filled 2023-07-13: qty 2

## 2023-07-13 MED ORDER — PANTOPRAZOLE SODIUM 40 MG PO TBEC
40.0000 mg | DELAYED_RELEASE_TABLET | Freq: Two times a day (BID) | ORAL | Status: DC
  Administered 2023-07-13 – 2023-07-14 (×3): 40 mg via ORAL
  Filled 2023-07-13 (×3): qty 1

## 2023-07-13 MED ORDER — ALPRAZOLAM 0.25 MG PO TABS
0.2500 mg | ORAL_TABLET | Freq: Every day | ORAL | Status: DC | PRN
Start: 2023-07-13 — End: 2023-07-14
  Administered 2023-07-13 – 2023-07-14 (×2): 0.25 mg via ORAL
  Filled 2023-07-13 (×2): qty 1

## 2023-07-13 MED ORDER — VITAMIN B-12 100 MCG PO TABS
100.0000 ug | ORAL_TABLET | Freq: Every day | ORAL | Status: DC
  Administered 2023-07-13 – 2023-07-14 (×2): 100 ug via ORAL
  Filled 2023-07-13 (×2): qty 1

## 2023-07-13 MED ORDER — CLOPIDOGREL BISULFATE 75 MG PO TABS
75.0000 mg | ORAL_TABLET | Freq: Every day | ORAL | Status: DC
  Administered 2023-07-13 – 2023-07-14 (×2): 75 mg via ORAL
  Filled 2023-07-13 (×2): qty 1

## 2023-07-13 NOTE — Progress Notes (Signed)
 Assessment & Plan: HD#5 - small bowel obstruction             SB protocol with contrast to colon  Continued BM's this AM             Advance to soft diet             Encouraged OOB, ambulation  Will follow.  Other issues per medical service (SOB this AM).        Oralee Billow, MD [facility] practice Office: [phone]        Chief Complaint: SBO  Subjective: Patient up in chair, family at bedside.  Complains of shortness of breath, weakness.  BM this AM.  Objective: Vital signs in last 24 hours: Temp:  [97.8 F (36.6 C)-98.9 F (37.2 C)] 97.8 F (36.6 C) (04/19 0907) Pulse Rate:  [79-85] 85 (04/19 0907) Resp:  [16-18] 18 (04/19 0907) BP: (135-170)/(71-90) 170/90 (04/19 0907) SpO2:  [94 %-99 %] 94 % (04/19 0907) Last BM Date : 07/12/23  Intake/Output from previous day: 04/18 0701 - 04/19 0700 In: 956.6 [P.O.:240; IV Piggyback:716.6] Out: -  Intake/Output this shift: No intake/output data recorded.  Physical Exam: HEENT - sclerae clear, mucous membranes moist Abdomen - protuberant, non-tender  Lab Results:  Recent Labs    07/11/23 0929 07/12/23 0341  WBC 13.4* 11.7*  HGB 12.7 12.0  HCT 41.3 38.5  PLT 395 371   BMET Recent Labs    07/11/23 0929 07/12/23 0341  NA 143 141  K 3.7 3.3*  CL 103 102  CO2 29 31  GLUCOSE 75 105*  BUN 15 8  CREATININE 0.78 0.65  CALCIUM  8.2* 8.3*   PT/INR No results for input(s): "LABPROT", "INR" in the last 72 hours. Comprehensive Metabolic Panel:    Component Value Date/Time   NA 141 07/12/2023 0341   NA 143 07/11/2023 0929   NA 140 06/27/2023 1121   K 3.3 (L) 07/12/2023 0341   K 3.7 07/11/2023 0929   CL 102 07/12/2023 0341   CL 103 07/11/2023 0929   CO2 31 07/12/2023 0341   CO2 29 07/11/2023 0929   BUN 8 07/12/2023 0341   BUN 15 07/11/2023 0929   BUN 8 06/27/2023 1121   CREATININE 0.65 07/12/2023 0341   CREATININE 0.78 07/11/2023 0929   GLUCOSE 105 (H) 07/12/2023 0341    GLUCOSE 75 07/11/2023 0929   CALCIUM  8.3 (L) 07/12/2023 0341   CALCIUM  8.2 (L) 07/11/2023 0929   AST 192 (H) 07/12/2023 0341   AST 420 (H) 07/11/2023 0929   ALT 942 (H) 07/12/2023 0341   ALT 1,257 (H) 07/11/2023 0929   ALKPHOS 77 07/12/2023 0341   ALKPHOS 83 07/11/2023 0929   BILITOT 0.9 07/12/2023 0341   BILITOT 0.8 07/11/2023 0929   PROT 5.4 (L) 07/12/2023 0341   PROT 5.6 (L) 07/11/2023 0929   ALBUMIN 2.5 (L) 07/12/2023 0341   ALBUMIN 2.6 (L) 07/11/2023 0929    Studies/Results: DG Abd 1 View Result Date: 07/12/2023 CLINICAL DATA:  [id number].  Bowel obstruction. EXAM: ABDOMEN - 1 VIEW COMPARISON:  Portable abdomen yesterday at 01:32 a.m. FINDINGS: 5:24 a.m. Enteric contrast has partially cleared from the large intestine in the interval with scattered dilute contrast remaining throughout the colon. There is no appreciable contrast in the small bowel. There is little if any change in right lower quadrant small bowel dilatation up to 3.6 cm. There is no supine evidence of free air. No other significant findings. Heavy  iliofemoral calcific arteriosclerosis is again noted with bilateral iliac stents. Lung bases are clear. IMPRESSION: 1. Enteric contrast has partially cleared from the large intestine in the interval with scattered dilute contrast remaining throughout the colon. 2. Little if any change in right lower quadrant small bowel dilatation up to 3.6 cm. 3. No supine evidence of free air. Electronically Signed   By: Denman Fischer M.D.   On: 07/12/2023 08:04      Oralee Billow 07/13/2023  Patient ID: Desiree Barber, female   DOB: 1950/09/16, 73 y.o.   MRN: [id number]

## 2023-07-13 NOTE — Progress Notes (Addendum)
   07/13/23 1049  Mobility  Activity Ambulated with assistance in hallway  Level of Assistance Contact guard assist, steadying assist  Assistive Device Other (Comment) (Handrails, doorframes, wall)  Distance Ambulated (ft) 100 ft  Activity Response Tolerated fair  Mobility Referral Yes  Mobility visit 1 Mobility  Mobility Specialist Start Time (ACUTE ONLY) 1049  Mobility Specialist Stop Time (ACUTE ONLY) 1111  Mobility Specialist Time Calculation (min) (ACUTE ONLY) 22 min   Mobility Specialist: Progress Note  Pre-Mobility:      HR 94, SpO2 93% 1L Post-Mobility:    HR 92, SpO2 96% 1L  Pt agreeable to mobility session - received in chair. C/o bottom soreness, BLE weakness, RLE sore, SOB, and dizziness which eased with ambulation. - RN notified and aware. Pt reaching for objects to hold onto throughout. Dinamap not showing a good SPO2 pleth during ambulation, VSS post mobility. Returned to chair with all needs met - call bell within reach. Husband present.  Isla Mari, BS Mobility Specialist Please contact via [employer] or  Rehab office at [phone].

## 2023-07-13 NOTE — Plan of Care (Signed)

## 2023-07-13 NOTE — Plan of Care (Signed)

## 2023-07-13 NOTE — Progress Notes (Signed)
 PROGRESS NOTE  QUINETTA SHILLING ZOX:[id number] DOB: 1950-08-02 DOA: 07/06/2023 PCP: Minus Amel, MD   LOS: 7 days   Brief narrative:  Desiree Barber is a 73 y.o. female with past medical history significant for chronic respiratory failure on as needed O2, COPD, hypertension, peripheral artery disease presented to hospital with abdominal pain and distention for 3 days with some nausea and dry heaving.  In the ED, patient was slightly tachycardic.  CT abdomen and pelvis with contrast-small bowel obstruction with transition point in the distal ileum in the right lower quadrant.  Mesenteric edema present.  No pneumatosis or free air.  Cholelithiasis with distended gallbladder.  General surgery was consulted and patient was started on NG tube IV fluids and Plavix  was on hold.  Patient was then admitted hospital for further evaluation and treatment.    Assessment/Plan: Principal Problem:   SBO (small bowel obstruction) (HCC) Active Problems:   SIRS (systemic inflammatory response syndrome) (HCC)   Acute respiratory failure with hypoxia (HCC)   HTN (hypertension)   Chronic respiratory failure with hypoxia (HCC)   Liver lesion   COPD (chronic obstructive pulmonary disease) (HCC)   PAD (peripheral artery disease) (HCC)  Acute respiratory failure- With history of underlying COPD,  Continue supportive care.  Patient states that she takes the oxygen  on and off at home due to COPD.  Received 1 dose of IV Lasix  Solu-Medrol  DuoNebs.  Continue incentive spirometry.  Currently on 2 L of oxygen  by nasal cannula.  Possibility of infiltrate in the lungs.  CT angiogram of the chest was negative for PE but trace pleural effusion and atelectasis with airway plugging and volume loss in the middle lobe and inferiorly.  Continue incentive spirometry and flutter valve.   2D echocardiogram showed LV ejection fraction of 70 to 75% with LVH and grade 1 diastolic dysfunction.  Wean off oxygen  as able.  Will  discontinue antibiotic from today.   sepsis secondary to pneumonia Improved.  Patient was tachycardic tachypneic with leukocytosis and possible infiltrate in the lungs.  On Zosyn  empirically.  Blood cultures negative in 4 days. Temperature max of 98.8.  F, has mild leukocytosis.    Small bowel obstruction. General surgery on board and has tolerated clears.  Has been advanced to full liquid this morning.  Surgery to advance to soft diet if tolerated.  Elevated LFTs, could be related to infections, shock liver Elevated AST ALT total bilirubin within normal limits.  AST ALT has significantly down trended.   Acute hepatitis panel is negative.  Right upper quadrant MRI, CT scan with fatty liver.  No biliary obstruction.  No lactic acidosis.  Will avoid hypotension.  Concern for altered mental status.  Resolved.  CT head scan was negative for acute findings for chronic microvascular changes.  CT venogram without any IVC thrombosis or portal vein thrombosis.  Dilated small bowel loops with air-fluid levels.  Hypokalemia.  Continue to replenish.  Latest potassium of 3.3.  Check BMP in AM.  PAD (peripheral artery disease)  Will resume Plavix .  Hypophosphatemia.  Replenished and improved.  Latest phosphorus of 2.9.  Mild hypernatremia.  Likely secondary to volume depletion.  Improved.  Latest sodium of 141.  Elevated creatinine likely mild acute kidney injury.  Creatinine on 07/12/2023 at 0.6 from at 1.2 encourage oral hydration.  Patient is negative balance for 4989 mL.  COPD (chronic obstructive pulmonary disease) (HCC) Continue bronchodilators.  Uses a as needed oxygen  at home.  Will wean if able.  Liver  lesion CT showed indeterminate hypodensity.  MRI of the liver with distended gallbladder, fatty liver possible late enhancing hemangioma.  HTN (hypertension) Will add as needed antihypertensives.  Will avoid for now.  Debility deconditioning.  Will get PT  evaluation.  DVT prophylaxis:  heparin  injection 5,000 Units Start: 07/08/23 1615 SCDs Start: 07/08/23 1520 Place TED hose Start: 07/08/23 1520   Disposition:   Likely home on 07/14/2023 if continues to improve, PT evaluation.  Status is: Inpatient  Remains inpatient appropriate because: Pending clinical improvement, elevated LFTs, supplemental oxygen     Code Status:     Code Status: Full Code  Family Communication: Spoke with the patient's daughter and spouse at bedside  Consultants: General Surgery  Procedures: NG tube placement and removal  Anti-infectives:  Zosyn  IV-will discontinue  Anti-infectives (From admission, onward)    Start     Dose/Rate Route Frequency Ordered Stop   07/07/23 0200  piperacillin -tazobactam (ZOSYN ) IVPB 3.375 g  Status:  Discontinued        3.375 g 12.5 mL/hr over 240 Minutes Intravenous Every 8 hours 07/06/23 2132 07/13/23 1014   07/06/23 1730  piperacillin -tazobactam (ZOSYN ) IVPB 3.375 g        3.375 g 100 mL/hr over 30 Minutes Intravenous  Once 07/06/23 1715 07/06/23 1846        Subjective: Today, patient was seen and examined at bedside.  Appears to be more alert awake and Communicative.  Has tolerated clears and has been advanced to full liquids.  Denies any nausea vomiting or abdominal pain.  Complains of generalized weakness and wishes to have PT evaluation.  Has some oxygen  at home as needed at home.  Objective: Vitals:   07/13/23 0436 07/13/23 0907  BP: 135/77 (!) 170/90  Pulse: 85 85  Resp: 16 18  Temp: 98.9 F (37.2 C) 97.8 F (36.6 C)  SpO2: 95% 94%    Intake/Output Summary (Last 24 hours) at 07/13/2023 1018 Last data filed at 07/13/2023 0300 Gross per 24 hour  Intake 716.59 ml  Output --  Net 716.59 ml   Filed Weights   07/06/23 1546 07/06/23 2150  Weight: 60 kg 60.4 kg   Body mass index is 22.85 kg/m.   Physical Exam:  GENERAL: Patient is alert awake and oriented. Not in obvious distress.  On nasal cannula oxygen  HENT: No scleral  pallor or icterus. Pupils equally reactive to light. Oral mucosa is moist NECK: is supple, no gross swelling noted. CHEST: Clear to auscultation.  No overt wheezing or crackles. CVS: S1 and S2 heard, no murmur. Regular rate and rhythm.  ABDOMEN: Soft, nontender, bowel sounds are present. EXTREMITIES: No edema. CNS: Cranial nerves are intact. No focal motor deficits. SKIN: warm and dry without rashes.  Data Review: I have personally reviewed the following laboratory data and studies,  CBC: Recent Labs  Lab 07/06/23 1622 07/07/23 0550 07/08/23 1633 07/09/23 0349 07/10/23 0647 07/11/23 0929 07/12/23 0341  WBC 16.1*   < > 10.3 11.1* 12.6* 13.4* 11.7*  NEUTROABS 14.2*  --   --   --   --   --   --   HGB 14.2   < > 13.1 13.0 12.8 12.7 12.0  HCT 43.9   < > 42.4 41.3 42.0 41.3 38.5  MCV 107.9*   < > 111.9* 108.7* 110.2* 110.1* 108.5*  PLT 328   < > 385 339 367 395 371   < > = values in this interval not displayed.   Basic Metabolic Panel: Recent Labs  Lab 07/08/23 0545 07/08/23 1633 07/08/23 2108 07/09/23 0349 07/10/23 0647 07/11/23 0929 07/12/23 0341  NA 142   < > 143 148* 146* 143 141  K 3.9   < > 3.8 3.7 3.0* 3.7 3.3*  CL 105   < > 104 108 107 103 102  CO2 30   < > 25 27 32 29 31  GLUCOSE 108*   < > 154* 136* 92 75 105*  BUN 15   < > 30* 27* 26* 15 8  CREATININE 0.59   < > 1.31* 1.29* 0.96 0.78 0.65  CALCIUM  8.5*   < > 8.7* 8.7* 8.4* 8.2* 8.3*  MG 2.1  --   --  2.4 2.3 2.1 2.0  PHOS 2.3*  --   --  3.9 2.9 3.2 2.9   < > = values in this interval not displayed.   Liver Function Tests: Recent Labs  Lab 07/08/23 2108 07/09/23 0349 07/10/23 0647 07/11/23 0929 07/12/23 0341  AST 2,912* 2,432* 968* 420* 192*  ALT 1,819* 2,028* 1,681* 1,257* 942*  ALKPHOS 149* 111 108 83 77  BILITOT 1.0 0.8 0.8 0.8 0.9  PROT 6.6 6.5 5.9* 5.6* 5.4*  ALBUMIN 3.0* 2.8* 2.7* 2.6* 2.5*   Recent Labs  Lab 07/06/23 1622  LIPASE 24   No results for input(s): "AMMONIA" in the last 168  hours. Cardiac Enzymes: No results for input(s): "CKTOTAL", "CKMB", "CKMBINDEX", "TROPONINI" in the last 168 hours. BNP (last 3 results) Recent Labs    06/27/23 1121 07/08/23 1633  BNP 34.0 236.0*    ProBNP (last 3 results) No results for input(s): "PROBNP" in the last 8760 hours.  CBG: No results for input(s): "GLUCAP" in the last 168 hours. Recent Results (from the past 240 hours)  Culture, blood (Routine X 2) w Reflex to ID Panel     Status: None   Collection Time: 07/07/23  5:50 AM   Specimen: BLOOD LEFT FOREARM  Result Value Ref Range Status   Specimen Description BLOOD LEFT FOREARM  Final   Special Requests BOTTLES DRAWN AEROBIC AND ANAEROBIC  Final   Culture   Final    NO GROWTH 5 DAYS Performed at [facility], [address], [place], [place] [address]    Report Status 07/12/2023 FINAL  Final  Culture, blood (Routine X 2) w Reflex to ID Panel     Status: None   Collection Time: 07/07/23  5:56 AM   Specimen: BLOOD  Result Value Ref Range Status   Specimen Description BLOOD BLOOD LEFT HAND  Final   Special Requests BOTTLES DRAWN AEROBIC AND ANAEROBIC  Final   Culture   Final    NO GROWTH 5 DAYS Performed at [facility], [address], [place], [place] [address]    Report Status 07/12/2023 FINAL  Final     Studies: DG Abd 1 View Result Date: 07/12/2023 CLINICAL DATA:  09811.  Bowel obstruction. EXAM: ABDOMEN - 1 VIEW COMPARISON:  Portable abdomen yesterday at 01:32 a.m. FINDINGS: 5:24 a.m. Enteric contrast has partially cleared from the large intestine in the interval with scattered dilute contrast remaining throughout the colon. There is no appreciable contrast in the small bowel. There is little if any change in right lower quadrant small bowel dilatation up to 3.6 cm. There is no supine evidence of free air. No other significant findings. 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 Lung bases are clear. IMPRESSION: 1.  Enteric contrast has partially cleared from the large intestine  in the interval with scattered dilute contrast remaining throughout the colon. 2. Little if any change in right lower quadrant small bowel dilatation up to 3.6 cm. 3. No supine evidence of free air. Electronically Signed   By: Denman Fischer M.D.   On: 07/12/2023 08:04      Rosena Conradi, MD  [facility] 07/13/2023  If 7PM-7AM, please contact night-coverage

## 2023-07-14 ENCOUNTER — Inpatient Hospital Stay (HOSPITAL_COMMUNITY)

## 2023-07-14 DIAGNOSIS — K56609 Unspecified intestinal obstruction, unspecified as to partial versus complete obstruction: Secondary | ICD-10-CM | POA: Diagnosis not present

## 2023-07-14 LAB — MAGNESIUM: Magnesium: 1.9 mg/dL (ref 1.7–2.4)

## 2023-07-14 LAB — CBC
HCT: 42.7 % (ref 36.0–46.0)
Hemoglobin: 13.6 g/dL (ref 12.0–15.0)
MCH: 33.7 pg (ref 26.0–34.0)
MCHC: 31.9 g/dL (ref 30.0–36.0)
MCV: 105.7 fL — ABNORMAL HIGH (ref 80.0–100.0)
Platelets: 444 10*3/uL — ABNORMAL HIGH (ref 150–400)
RBC: 4.04 MIL/uL (ref 3.87–5.11)
RDW: 13.2 % (ref 11.5–15.5)
WBC: 16.5 10*3/uL — ABNORMAL HIGH (ref 4.0–10.5)
nRBC: 0 % (ref 0.0–0.2)

## 2023-07-14 LAB — COMPREHENSIVE METABOLIC PANEL WITH GFR
ALT: 455 U/L — ABNORMAL HIGH (ref 0–44)
AST: 41 U/L (ref 15–41)
Albumin: 2.7 g/dL — ABNORMAL LOW (ref 3.5–5.0)
Alkaline Phosphatase: 78 U/L (ref 38–126)
Anion gap: 11 (ref 5–15)
BUN: <5 mg/dL — ABNORMAL LOW (ref 8–23)
CO2: 31 mmol/L (ref 22–32)
Calcium: 8.6 mg/dL — ABNORMAL LOW (ref 8.9–10.3)
Chloride: 96 mmol/L — ABNORMAL LOW (ref 98–111)
Creatinine, Ser: 0.73 mg/dL (ref 0.44–1.00)
GFR, Estimated: >60 mL/min (ref >60)
Glucose, Bld: 100 mg/dL — ABNORMAL HIGH (ref 70–99)
Potassium: 3.8 mmol/L (ref 3.5–5.1)
Sodium: 138 mmol/L (ref 135–145)
Total Bilirubin: 0.9 mg/dL (ref 0.0–1.2)
Total Protein: 6 g/dL — ABNORMAL LOW (ref 6.5–8.1)

## 2023-07-14 MED ORDER — SENNOSIDES-DOCUSATE SODIUM 8.6-50 MG PO TABS: 1.0000 | ORAL_TABLET | Freq: Every evening | ORAL | 0 refills | Status: DC | PRN

## 2023-07-14 MED ORDER — ONDANSETRON HCL 4 MG PO TABS: 4.0000 mg | ORAL_TABLET | Freq: Four times a day (QID) | ORAL | 0 refills | Status: AC | PRN

## 2023-07-14 NOTE — Plan of Care (Signed)
 ?  Problem: Education: ?Goal: Knowledge of General Education information will improve ?Description: Including pain rating scale, medication(s)/side effects and non-pharmacologic comfort measures ?Outcome: Progressing ?  ?Problem: Health Behavior/Discharge Planning: ?Goal: Ability to manage health-related needs will improve ?Outcome: Progressing ?  ?Problem: Coping: ?Goal: Level of anxiety will decrease ?Outcome: Progressing ?  ?

## 2023-07-14 NOTE — Progress Notes (Signed)
    Assessment & Plan: HD#6 - small bowel obstruction             Continued BM's this AM, now formed             Soft diet - limited po intake  Dietician to see - ?supplements             Encouraged OOB, ambulation   Will follow.  Other issues per medical service (continued SOB this AM, possible pneumonia, WBC 16)        Oralee Billow, MD [facility] practice Office: [phone]        Chief Complaint: SBO  Subjective: Patient in bed, anxious, SOB.  Family at bedside.  Formed BM this AM.  Tolerating limited soft diet.  Objective: Vital signs in last 24 hours: Temp:  [97.8 F (36.6 C)-98.9 F (37.2 C)] 98 F (36.7 C) (04/20 0616) Pulse Rate:  [85-105] 99 (04/20 0616) Resp:  [16-20] 20 (04/20 0616) BP: (163-196)/(72-90) 194/72 (04/20 0616) SpO2:  [93 %-100 %] 94 % (04/20 0616) Last BM Date : 07/13/23  Intake/Output from previous day: 04/19 0701 - 04/20 0700 In: 700 [P.O.:700] Out: 4 [Urine:4] Intake/Output this shift: No intake/output data recorded.  Physical Exam: HEENT - sclerae clear, mucous membranes moist Abdomen - soft, protuberant; non-tender  Lab Results:  Recent Labs    07/12/23 0341 07/14/23 0355  WBC 11.7* 16.5*  HGB 12.0 13.6  HCT 38.5 42.7  PLT 371 444*   BMET Recent Labs    07/12/23 0341 07/14/23 0355  NA 141 138  K 3.3* 3.8  CL 102 96*  CO2 31 31  GLUCOSE 105* 100*  BUN 8 <5*  CREATININE 0.65 0.73  CALCIUM  8.3* 8.6*   PT/INR No results for input(s): "LABPROT", "INR" in the last 72 hours. Comprehensive Metabolic Panel:    Component Value Date/Time   NA 138 07/14/2023 0355   NA 141 07/12/2023 0341   NA 140 06/27/2023 1121   K 3.8 07/14/2023 0355   K 3.3 (L) 07/12/2023 0341   CL 96 (L) 07/14/2023 0355   CL 102 07/12/2023 0341   CO2 31 07/14/2023 0355   CO2 31 07/12/2023 0341   BUN <5 (L) 07/14/2023 0355   BUN 8 07/12/2023 0341   BUN 8 06/27/2023 1121   CREATININE 0.73 07/14/2023 0355   CREATININE  0.65 07/12/2023 0341   GLUCOSE 100 (H) 07/14/2023 0355   GLUCOSE 105 (H) 07/12/2023 0341   CALCIUM  8.6 (L) 07/14/2023 0355   CALCIUM  8.3 (L) 07/12/2023 0341   AST 41 07/14/2023 0355   AST 192 (H) 07/12/2023 0341   ALT 455 (H) 07/14/2023 0355   ALT 942 (H) 07/12/2023 0341   ALKPHOS 78 07/14/2023 0355   ALKPHOS 77 07/12/2023 0341   BILITOT 0.9 07/14/2023 0355   BILITOT 0.9 07/12/2023 0341   PROT 6.0 (L) 07/14/2023 0355   PROT 5.4 (L) 07/12/2023 0341   ALBUMIN 2.7 (L) 07/14/2023 0355   ALBUMIN 2.5 (L) 07/12/2023 0341    Studies/Results: No results found.    Oralee Billow 07/14/2023  Patient ID: Desiree Barber, female   DOB: 1950/12/01, 73 y.o.   MRN: [id number]

## 2023-07-14 NOTE — TOC Transition Note (Signed)
 Transition of Care [facility]) - Discharge Note   Patient Details  Name: Desiree Barber MRN: [id number] Date of Birth: 12/28/1950  [facility] CM/SW Contact:  Jannine Meo, RN Phone Number: 07/14/2023, 10:00 AM   Clinical Narrative:   Secure message from provider that patient needs home health and home oxygen . Spoke with Patient and family at bedside. Patient reports that she has home oxygen  and has her portable tank here with her at the hospital. [facility] PT arranged through [facility] with [facility], patient nor family had a preference.    Final next level of care: Home w Home Health Services Barriers to Discharge: No Barriers Identified   Patient Goals and CMS Choice            Discharge Placement                       Discharge Plan and Services Additional resources added to the After Visit Summary for                            [facility] Arranged: PT HH Agency: [facility]: [facility] Date [facility] Agency Contacted: 07/14/23 Time HH Agency Contacted: [phone] Representative spoke with at [facility] Agency: Randel Buss  Social Drivers of Health (SDOH) Interventions SDOH Screenings   Food Insecurity: No Food Insecurity (07/06/2023)  Housing: Low Risk  (07/06/2023)  Transportation Needs: No Transportation Needs (07/06/2023)  Utilities: Not At Risk (07/06/2023)  Depression (PHQ2-9): Low Risk  (08/22/2022)  Social Connections: Socially Integrated (07/06/2023)  Tobacco Use: Medium Risk (07/06/2023)     Readmission Risk Interventions     No data to display

## 2023-07-14 NOTE — Progress Notes (Signed)
   07/14/23 0855  Mobility  Activity Ambulated with assistance in room  Level of Assistance Minimal assist, patient does 75% or more  Assistive Device Other (Comment) (HHA)  Distance Ambulated (ft) 30 ft  Activity Response Tolerated fair  Mobility Referral Yes  Mobility visit 1 Mobility  Mobility Specialist Start Time (ACUTE ONLY) 0855  Mobility Specialist Stop Time (ACUTE ONLY) 0933  Mobility Specialist Time Calculation (min) (ACUTE ONLY) 38 min   Mobility Specialist: Progress Note  Pre-Mobility:      SpO2 91% 1.5L During Mobility: SpO2 80-86% 2L to 89-91% 3L Post-Mobility:   SpO2 91% 2L  Pt agreeable to mobility session - received in bed. C/o SOB and feeling anxious about mobility. Pt reassured and encouraged by family. Returned to EOB to eat breakfast with all needs met - call bell within reach.  MD present towards EOS  Isla Mari, BS Mobility Specialist Please contact via SecureChat or  Rehab office at [phone].

## 2023-07-14 NOTE — Discharge Summary (Signed)
 Physician Discharge Summary  BRITANIE HARSHMAN Barber:[id number] DOB: 1950-11-29 DOA: 07/06/2023  PCP: Minus Amel, MD  Admit date: 07/06/2023 Discharge date: 07/14/2023  Admitted From: Home  Discharge disposition: Home with home health   Recommendations for Outpatient Follow-Up:   Follow up with your primary care provider in one week.  Check CBC, BMP, magnesium in the next visit   Discharge Diagnosis:   Principal Problem:   SBO (small bowel obstruction) (HCC) Active Problems:   SIRS (systemic inflammatory response syndrome) (HCC)   Acute respiratory failure with hypoxia (HCC)   HTN (hypertension)   Chronic respiratory failure with hypoxia (HCC)   Liver lesion   COPD (chronic obstructive pulmonary disease) (HCC)   PAD (peripheral artery disease) (HCC)   Discharge Condition: Improved.  Diet recommendation: Low sodium, heart healthy.  Soft diet  Wound care: None.  Code status: Full.   History of Present Illness:   Desiree Barber is a 73 y.o. female with past medical history significant for chronic respiratory failure on as needed O2, COPD, hypertension, peripheral artery disease presented to hospital with abdominal pain and distention for 3 days with some nausea and dry heaving.  In the ED, patient was slightly tachycardic.  CT abdomen and pelvis with contrast-small bowel obstruction with transition point in the distal ileum in the right lower quadrant.  Mesenteric edema present.  No pneumatosis or free air.  Cholelithiasis with distended gallbladder.  General surgery was consulted and patient was started on NG tube IV fluids and Plavix  was on hold.  Patient was then admitted hospital for further evaluation and treatment.   Hospital Course:   Following conditions were addressed during hospitalization as listed below,  Acute respiratory failure- With history of underlying COPD,  Continue supportive care.  Patient states that she takes the oxygen  on and off at home due  to COPD.  Received 1 dose of IV Lasix  Solu-Medrol  DuoNebs.  Continue incentive spirometry.  Currently on 2 L of oxygen  by nasal cannula.  Has qualified for home oxygen  at 3 L/min.  Possibility of infiltrate in the lungs has completed course of antibiotic..  CT angiogram of the chest was negative for PE but trace pleural effusion and atelectasis with airway plugging and volume loss in the middle lobe and inferiorly.  Continue incentive spirometry and flutter valve on discharge..   2D echocardiogram showed LV ejection fraction of 70 to 75% with LVH and grade 1 diastolic dysfunction.  Has completed course of antibiotics.   Sepsis secondary to pneumonia Improved.  Patient was tachycardic tachypneic with leukocytosis and possible infiltrate in the lungs.  Completed course of Zosyn  empirically.  Blood cultures negative in 5 days.  Afebrile.  Has mild leukocytosis which needs to be followed up as outpatient.  Continue incentive spirometry deep breathing after discharge.   Small bowel obstruction. General surgery was consulted during hospitalization.  Has resolved at this time.  Has been tolerating well and moving bowels.    Elevated LFTs, could be related to infections, shock liver Elevated AST ALT total bilirubin within normal limits.  AST ALT has significantly down trended.   Acute hepatitis panel is negative.  Right upper quadrant MRI, CT scan with fatty liver.  No biliary obstruction.  No lactic acidosis.  Check CMP as outpatient.  Concern for altered mental status.   Resolved.  CT head scan was negative for acute findings for chronic microvascular changes.  CT venogram without any IVC thrombosis or portal vein thrombosis.  Dilated small bowel  loops with air-fluid levels.   Hypokalemia.  Improved after replacement.  Latest potassium 3.8.   PAD (peripheral artery disease)  Plavix  has been resumed.   Hypophosphatemia.  Replenished and improved.  Latest phosphorus of 2.9.     acute kidney injury.   Creatinine on 07/12/2023 at 0.6 from at 1.2 encourage oral hydration.  Patient is negative balance for 4989 mL.  Creatinine today at 0.7.   COPD (chronic obstructive pulmonary disease)  Continue bronchodilators.  Uses a as needed oxygen  at home.  Will need as well as of oxygen  continuously at this time.   Liver lesion CT showed indeterminate hypodensity.  MRI of the liver with distended gallbladder, fatty liver possible late enhancing hemangioma.  Known to the patient and the family.   HTN (hypertension) Not on medications at home.   Debility deconditioning.  Will get home health PT on discharge.  Disposition.  At this time, patient is stable for disposition home with home health with outpatient PCP follow-up.  Spoke with the patient's daughter and spouse at bedside prior to disposition.  Medical Consultants:   General Surgery  Procedures:    NG tube placement and removal. Subjective:   Today, patient was seen and examined at bedside.  No nausea vomiting abdominal pain has had bowel movement.  Feels anxious but overall at her baseline with ambulation.  Discharge Exam:   Vitals:   07/14/23 0906 07/14/23 0914  BP: (!) 165/68 (!) 165/68  Pulse: (!) 110 77  Resp: 17 17  Temp: 97.9 F (36.6 C) 97.9 F (36.6 C)  SpO2: 96% 97%   Vitals:   07/14/23 0145 07/14/23 0616 07/14/23 0906 07/14/23 0914  BP: (!) 163/72 (!) 194/72 (!) 165/68 (!) 165/68  Pulse: (!) 105 99 (!) 110 77  Resp:  20 17 17   Temp:  98 F (36.7 C) 97.9 F (36.6 C) 97.9 F (36.6 C)  TempSrc:  Oral  Axillary  SpO2:  94% 96% 97%  Weight:      Height:        General: Alert awake, not in obvious distress, on nasal cannula oxygen , mildly anxious HENT: pupils equally reacting to light,  No scleral pallor or icterus noted. Oral mucosa is moist.  Chest: .  Diminished breath sounds bilaterally.  Coarse breath sounds noted. CVS: S1 &S2 heard. No murmur.  Regular rate and rhythm. Abdomen: Soft, nontender,  nondistended.  Bowel sounds are heard.   Extremities: No cyanosis, clubbing or edema.  Peripheral pulses are palpable. Psych: Alert, awake and oriented, mildly anxious CNS:  No cranial nerve deficits.  Power equal in all extremities.   Skin: Warm and dry.  No rashes noted.  The results of significant diagnostics from this hospitalization (including imaging, microbiology, ancillary and laboratory) are listed below for reference.     Diagnostic Studies:   DG Abd Portable 1 View Result Date: 07/07/2023 CLINICAL DATA:  NG tube placement EXAM: PORTABLE ABDOMEN - 1 VIEW COMPARISON:  No comparison studies available. FINDINGS: NG tube tip is in the proximal stomach. Side port of the NG tube is at or just below the GE junction. Diffuse gaseous distention of bowel loops evident. IMPRESSION: NG tube tip is in the stomach with proximal side port in the region of the GE junction. Advancement of the NG tube by 3-4 cm could ensure placement of the side port below the GE junction as clinically warranted. Electronically Signed   By: Donnal Fusi M.D.   On: 07/07/2023 10:52   DG  Chest Portable 1 View Result Date: 07/06/2023 CLINICAL DATA:  Check gastric catheter placement EXAM: PORTABLE CHEST 1 VIEW COMPARISON:  12/23/2021 FINDINGS: Cardiac shadow is stable. Aortic calcifications are noted. Gastric catheter extends into the stomach. IMPRESSION: Gastric catheter within the stomach. Electronically Signed   By: Violeta Grey M.D.   On: 07/06/2023 20:44   CT ABDOMEN PELVIS W CONTRAST Result Date: 07/06/2023 CLINICAL DATA:  Acute abdominal pain EXAM: CT ABDOMEN AND PELVIS WITH CONTRAST TECHNIQUE: Multidetector CT imaging of the abdomen and pelvis was performed using the standard protocol following bolus administration of intravenous contrast. RADIATION DOSE REDUCTION: This exam was performed according to the departmental dose-optimization program which includes automated exposure control, adjustment of the mA and/or kV  according to patient size and/or use of iterative reconstruction technique. CONTRAST:  OMNIPAQUE  IOHEXOL  300 MG/ML  SOLN COMPARISON:  CT abdomen and pelvis 07/15/2008. FINDINGS: Lower chest: There are tree-in-bud opacities in the right middle lobe, likely infectious/inflammatory. There is a ground-glass nodule in the left lower lobe measuring 5 mm image 3/11. Hepatobiliary: There is a hypodensity in the posterior right lobe of the liver measuring 12 mm which is not seen on prior measuring 55 Hounsfield units, indeterminate. A gallstone is present measuring 3 cm. The gallbladder is distended. There is no biliary ductal dilatation. Pancreas: Unremarkable. No pancreatic ductal dilatation or surrounding inflammatory changes. Spleen: There is a calcified granuloma in the spleen. Spleen is otherwise within normal limits. Adrenals/Urinary Tract: Adrenal glands are unremarkable. Kidneys are normal, without renal calculi, focal lesion, or hydronephrosis. Bladder is unremarkable. Stomach/Bowel: There are mildly dilated small bowel loops throughout the abdomen and pelvis with some associated mesenteric edema and air-fluid levels. Transition point is seen in the distal ileum in the right lower quadrant. No pneumatosis or free air. Colon is nondilated. Stomach is moderately distended. There is a small hiatal hernia. The appendix is not visualized. Vascular/Lymphatic: Aortic atherosclerosis. No enlarged abdominal or pelvic lymph nodes. Reproductive: Uterus and bilateral adnexa are unremarkable. Other: No abdominal wall hernia or abnormality. No abdominopelvic ascites. Musculoskeletal: There are degenerative changes of the lumbar spine. IMPRESSION: 1. Small-bowel obstruction with transition point in the distal ileum in the right lower quadrant. Mesenteric edema is present. No pneumatosis or free air. 2. Cholelithiasis with distended gallbladder. 3. Indeterminate 12 mm hypodensity in the right lobe of the liver. Recommend  further evaluation with ultrasound or MRI. 4. Tree-in-bud opacities in the right middle lobe, likely infectious/inflammatory. 5. 5 mm ground-glass nodule in the left lower lobe. No follow-up recommended. This recommendation follows the consensus statement: Guidelines for Management of Incidental Pulmonary Nodules Detected on CT Images: From the Fleischner Society 2017; Radiology 2017; 284:228-243. 6. Aortic atherosclerosis. Electronically Signed   By: Tyron Gallon M.D.   On: 07/06/2023 19:03     Labs:   Basic Metabolic Panel: Recent Labs  Lab 07/08/23 0545 07/08/23 1633 07/09/23 0349 07/10/23 0647 07/11/23 0929 07/12/23 0341 07/14/23 0355  NA 142   < > 148* 146* 143 141 138  K 3.9   < > 3.7 3.0* 3.7 3.3* 3.8  CL 105   < > 108 107 103 102 96*  CO2 30   < > 27 32 29 31 31   GLUCOSE 108*   < > 136* 92 75 105* 100*  BUN 15   < > 27* 26* 15 8 <5*  CREATININE 0.59   < > 1.29* 0.96 0.78 0.65 0.73  CALCIUM  8.5*   < > 8.7* 8.4* 8.2* 8.3*  8.6*  MG 2.1  --  2.4 2.3 2.1 2.0 1.9  PHOS 2.3*  --  3.9 2.9 3.2 2.9  --    < > = values in this interval not displayed.   GFR Estimated Creatinine Clearance: 54.9 mL/min (by C-G formula based on SCr of 0.73 mg/dL). Liver Function Tests: Recent Labs  Lab 07/09/23 0349 07/10/23 0647 07/11/23 0929 07/12/23 0341 07/14/23 0355  AST 2,432* 968* 420* 192* 41  ALT 2,028* 1,681* 1,257* 942* 455*  ALKPHOS 111 108 83 77 78  BILITOT 0.8 0.8 0.8 0.9 0.9  PROT 6.5 5.9* 5.6* 5.4* 6.0*  ALBUMIN 2.8* 2.7* 2.6* 2.5* 2.7*   No results for input(s): "LIPASE", "AMYLASE" in the last 168 hours. No results for input(s): "AMMONIA" in the last 168 hours. Coagulation profile Recent Labs  Lab 07/08/23 0545 07/08/23 2108  INR 1.0 1.4*    CBC: Recent Labs  Lab 07/09/23 0349 07/10/23 0647 07/11/23 0929 07/12/23 0341 07/14/23 0355  WBC 11.1* 12.6* 13.4* 11.7* 16.5*  HGB 13.0 12.8 12.7 12.0 13.6  HCT 41.3 42.0 41.3 38.5 42.7  MCV 108.7* 110.2* 110.1* 108.5*  105.7*  PLT 339 367 395 371 444*   Cardiac Enzymes: No results for input(s): "CKTOTAL", "CKMB", "CKMBINDEX", "TROPONINI" in the last 168 hours. BNP: Invalid input(s): "POCBNP" CBG: No results for input(s): "GLUCAP" in the last 168 hours. D-Dimer No results for input(s): "DDIMER" in the last 72 hours. Hgb A1c No results for input(s): "HGBA1C" in the last 72 hours. Lipid Profile No results for input(s): "CHOL", "HDL", "LDLCALC", "TRIG", "CHOLHDL", "LDLDIRECT" in the last 72 hours. Thyroid  function studies No results for input(s): "TSH", "T4TOTAL", "T3FREE", "THYROIDAB" in the last 72 hours.  Invalid input(s): "FREET3" Anemia work up No results for input(s): "VITAMINB12", "FOLATE", "FERRITIN", "TIBC", "IRON ", "RETICCTPCT" in the last 72 hours. Microbiology Recent Results (from the past 240 hours)  Culture, blood (Routine X 2) w Reflex to ID Panel     Status: None   Collection Time: 07/07/23  5:50 AM   Specimen: BLOOD LEFT FOREARM  Result Value Ref Range Status   Specimen Description BLOOD LEFT FOREARM  Final   Special Requests BOTTLES DRAWN AEROBIC AND ANAEROBIC  Final   Culture   Final    NO GROWTH 5 DAYS Performed at [facility], [address], [place], [place] [address]    Report Status 07/12/2023 FINAL  Final  Culture, blood (Routine X 2) w Reflex to ID Panel     Status: None   Collection Time: 07/07/23  5:56 AM   Specimen: BLOOD  Result Value Ref Range Status   Specimen Description BLOOD BLOOD LEFT HAND  Final   Special Requests BOTTLES DRAWN AEROBIC AND ANAEROBIC  Final   Culture   Final    NO GROWTH 5 DAYS Performed at [facility], [address] [place], [place] [address]    Report Status 07/12/2023 FINAL  Final     Discharge Instructions:   Discharge Instructions     Call MD for:  persistant nausea and vomiting   Complete by: As directed    Call MD for:  severe uncontrolled pain   Complete by: As directed    Call MD for:  temperature >100.4   Complete  by: As directed    Diet - low sodium heart healthy   Complete by: As directed    Soft diet and advance as tolerated   Discharge instructions   Complete by: As directed    Follow-up with your  primary care provider in 1 week.  Check blood work at that time.  Continue oxygen  at home as prescribed.  No overexertion.  Seek medical attention for worsening symptoms.   Increase activity slowly   Complete by: As directed       Allergies as of 07/14/2023       Reactions   Demerol [meperidine] Nausea And Vomiting   Oxycontin  [oxycodone  Hcl] Nausea And Vomiting   Fish Allergy Nausea Only   Only fish from [place]   Lavender Oil Itching   And hives   Other Itching   Flower oils-gets hives too        Medication List     TAKE these medications    albuterol  108 (90 Base) MCG/ACT inhaler Commonly known as: VENTOLIN  HFA Inhale 1-2 puffs into the lungs every 6 (six) hours as needed for shortness of breath or wheezing. What changed: Another medication with the same name was changed. Make sure you understand how and when to take each.   albuterol  (2.5 MG/3ML) 0.083% nebulizer solution Commonly known as: PROVENTIL  Up to every 4 hours as needed What changed:  how much to take how to take this when to take this reasons to take this additional instructions   ALPRAZolam  0.25 MG tablet Commonly known as: XANAX  Take 0.25 mg by mouth daily as needed for anxiety or sleep.   Anoro Ellipta  62.5-25 MCG/ACT Aepb Generic drug: umeclidinium-vilanterol Inhale 1 puff into the lungs daily.   clopidogrel  75 MG tablet Commonly known as: PLAVIX  TAKE (1) TABLET BY MOUTH ONCE DAILY. What changed: See the new instructions.   ferrous sulfate  324 (65 Fe) MG Tbec Take 1 tablet (325 mg total) by mouth daily with breakfast. What changed: when to take this   ondansetron  4 MG tablet Commonly known as: ZOFRAN  Take 1 tablet (4 mg total) by mouth every 6 (six) hours as needed for nausea.    pantoprazole  40 MG tablet Commonly known as: PROTONIX  Take 1 tablet (40 mg total) by mouth 2 (two) times daily.   senna-docusate 8.6-50 MG tablet Commonly known as: Senokot-S Take 1 tablet by mouth at bedtime as needed for mild constipation.   vitamin B-12 100 MCG tablet Commonly known as: CYANOCOBALAMIN  Take 100 mcg by mouth daily.   vitamin C 100 MG tablet Take 100 mg by mouth daily.   VITAMIN D (CHOLECALCIFEROL) PO Take 1 tablet by mouth daily. One daily               Durable Medical Equipment  (From admission, onward)           Start     Ordered   07/14/23 0923  For home use only DME oxygen   Once       Question Answer Comment  Length of Need Lifetime   Mode or (Route) Nasal cannula   Liters per Minute 3   Frequency Continuous (stationary and portable oxygen  unit needed)   Oxygen  conserving device Yes   Oxygen  delivery system Gas      07/14/23 0923            Follow-up Information     Minus Amel, MD Follow up in 1 week(s).   Specialty: Family Medicine Contact information: [address] [place] [address] [phone]         [facility] Follow up.   Specialty: Home Health Services Why: Physical therapy. Office will call to arrange follow up after hospital discharge. Contact information: [address] [place] [address] [phone]                  Time coordinating discharge: 39 minutes  Signed:  Shiri Hodapp  [facility] 07/14/2023, 4:37 PM

## 2023-07-17 ENCOUNTER — Encounter: Payer: Self-pay | Admitting: Internal Medicine

## 2023-07-17 ENCOUNTER — Ambulatory Visit (INDEPENDENT_AMBULATORY_CARE_PROVIDER_SITE_OTHER): Admitting: Internal Medicine

## 2023-07-17 VITALS — BP 134/71 | HR 92 | Temp 97.5°F | Wt 131.0 lb

## 2023-07-17 DIAGNOSIS — K31819 Angiodysplasia of stomach and duodenum without bleeding: Secondary | ICD-10-CM

## 2023-07-17 DIAGNOSIS — Z9181 History of falling: Secondary | ICD-10-CM | POA: Diagnosis not present

## 2023-07-17 DIAGNOSIS — I739 Peripheral vascular disease, unspecified: Secondary | ICD-10-CM | POA: Diagnosis not present

## 2023-07-17 DIAGNOSIS — Z7902 Long term (current) use of antithrombotics/antiplatelets: Secondary | ICD-10-CM | POA: Diagnosis not present

## 2023-07-17 DIAGNOSIS — K56609 Unspecified intestinal obstruction, unspecified as to partial versus complete obstruction: Secondary | ICD-10-CM

## 2023-07-17 DIAGNOSIS — J90 Pleural effusion, not elsewhere classified: Secondary | ICD-10-CM | POA: Diagnosis not present

## 2023-07-17 DIAGNOSIS — K760 Fatty (change of) liver, not elsewhere classified: Secondary | ICD-10-CM | POA: Diagnosis not present

## 2023-07-17 DIAGNOSIS — J9811 Atelectasis: Secondary | ICD-10-CM | POA: Diagnosis not present

## 2023-07-17 DIAGNOSIS — R7989 Other specified abnormal findings of blood chemistry: Secondary | ICD-10-CM | POA: Diagnosis not present

## 2023-07-17 DIAGNOSIS — J189 Pneumonia, unspecified organism: Secondary | ICD-10-CM | POA: Diagnosis not present

## 2023-07-17 DIAGNOSIS — J441 Chronic obstructive pulmonary disease with (acute) exacerbation: Secondary | ICD-10-CM | POA: Diagnosis not present

## 2023-07-17 DIAGNOSIS — N179 Acute kidney failure, unspecified: Secondary | ICD-10-CM | POA: Diagnosis not present

## 2023-07-17 DIAGNOSIS — K8020 Calculus of gallbladder without cholecystitis without obstruction: Secondary | ICD-10-CM | POA: Diagnosis not present

## 2023-07-17 DIAGNOSIS — R918 Other nonspecific abnormal finding of lung field: Secondary | ICD-10-CM | POA: Diagnosis not present

## 2023-07-17 DIAGNOSIS — D509 Iron deficiency anemia, unspecified: Secondary | ICD-10-CM

## 2023-07-17 DIAGNOSIS — A419 Sepsis, unspecified organism: Secondary | ICD-10-CM | POA: Diagnosis not present

## 2023-07-17 DIAGNOSIS — I119 Hypertensive heart disease without heart failure: Secondary | ICD-10-CM | POA: Diagnosis not present

## 2023-07-17 DIAGNOSIS — Z9981 Dependence on supplemental oxygen: Secondary | ICD-10-CM | POA: Diagnosis not present

## 2023-07-17 DIAGNOSIS — J440 Chronic obstructive pulmonary disease with acute lower respiratory infection: Secondary | ICD-10-CM | POA: Diagnosis not present

## 2023-07-17 DIAGNOSIS — I700 Atherosclerosis of aorta: Secondary | ICD-10-CM | POA: Diagnosis not present

## 2023-07-17 DIAGNOSIS — J9621 Acute and chronic respiratory failure with hypoxia: Secondary | ICD-10-CM | POA: Diagnosis not present

## 2023-07-17 NOTE — Patient Instructions (Signed)
 We will continue to monitor your liver test.  I think these will slowly improve over time.  I discussed your case further with Dr. Collene Dawson who recommended continue monitoring as far as your recent bowel obstruction.  Agree with following up with your PCP for your breathing issues.  It was very nice seeing both you today.  Dr. Mordechai April

## 2023-07-17 NOTE — Progress Notes (Signed)
 Referring Provider: Minus Amel, MD Primary Care Physician:  Minus Amel, MD Primary GI:  Dr. Mordechai April  Chief Complaint  Patient presents with   Hospitalization Follow-up    Follow up on hospitalization for small bowel obstruction.     HPI:   Desiree Barber is a 73 y.o. female who presents to clinic today for hospital follow up visit.   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 Pylori -Esophageal biopsies consistent with Barrett's -Advised repeat EGD in 5 years for surveillance   Colonoscopy 08/27/22: -Nonbleeding internal hemorrhoids -Pancolonic diverticulosis -Advised repeat in 10 years for screening   Given positive H. pylori on pathology she was treated with bismuth  quadruple (metronidazole , tetracycline ) therapy with PPI briefly increased to twice daily.  Sunsequent H pylori breath test positive. Retreated with salvage therapy with levofloxacin /amoxicillin .  Underwent capsule endoscopy 01/29/2023 which showed multiple angiodysplastic lesions /angioectasia's and erosions present throughout proximal and mid small bowel without active bleeding.  Small bowel enteroscopy 03/25/2023 showed small hiatal hernias, 2 angioectasias in the stomach treated with APC, 7 angioectasias in the duodenum treated with APC.  Most recent CBC from 07/14/2023 showed hemoglobin 13.6.  Small bowel obstruction: Admitted to [facility] 07/06/2023 after initially presenting with abdominal pain nausea, abdominal distention.  In the ER CT abdomen pelvis showed small bowel obstruction with transition point in the distal ileum.  Also admitted for acute on chronic respiratory failure in the setting of sepsis/pneumonia.  General  surgery was consulted due to small bowel obstruction which resolved with conservative measures.  During her hospitalization, she had a significant rise in her aminotransferases up to as high as AST 2912, ALT 1819, alk phos 149, T. bili normal.  These have steadily improved.  On day of discharge, AST 41, ALT 455, alk phos 78, T. bili 0.9.  Past Medical History:  Diagnosis Date   Anemia    Anxiety    Occasional   Arthritis    COPD (chronic obstructive pulmonary disease) (HCC)    HTN (hypertension)    Iron  deficiency anemia due to chronic blood loss 03/28/2022   Peripheral arterial disease [facility])     Past Surgical History:  Procedure Laterality Date   ABDOMINAL AORTOGRAM W/LOWER EXTREMITY N/A 04/10/2022   Procedure: ABDOMINAL AORTOGRAM W/LOWER EXTREMITY;  Surgeon: Margherita Shell, MD;  [facility];  Service: Cardiovascular;  Laterality: N/A;   BIOPSY  08/27/2022   Procedure: BIOPSY;  Surgeon: Vinetta Greening, DO;  [facility];  Service: Endoscopy;;   BIOPSY  03/25/2023   Procedure: BIOPSY;  Surgeon: Vinetta Greening, DO;  [facility];  Service: Endoscopy;;   COLONOSCOPY  02/23/04   RMR: normal rectum and colon.minimal internal hemorrhoids   COLONOSCOPY WITH PROPOFOL  N/A 06/13/2015   Procedure: COLONOSCOPY WITH PROPOFOL ;  Surgeon: Suzette Espy, MD;  [facility];  Service: Endoscopy;  Laterality: N/A;  1045   COLONOSCOPY WITH PROPOFOL  N/A 08/27/2022   Procedure: COLONOSCOPY WITH PROPOFOL ;  Surgeon: Vinetta Greening, DO;  [facility];  Service: Endoscopy;  Laterality: N/A;  8:45 am, asa 3   ENTEROSCOPY N/A 03/25/2023   Procedure: ENTEROSCOPY;  Surgeon: Vinetta Greening, DO;  [facility];  Service: Endoscopy;  Laterality: N/A;  115pm,  asa 3   ESOPHAGOGASTRODUODENOSCOPY (EGD) WITH PROPOFOL  N/A 08/27/2022   Procedure: ESOPHAGOGASTRODUODENOSCOPY (EGD) WITH PROPOFOL ;  Surgeon: Vinetta Greening, DO;  [facility];  Service: Endoscopy;  Laterality: N/A;   GIVENS CAPSULE STUDY N/A 01/29/2023   Procedure: GIVENS CAPSULE STUDY;  Surgeon: Vinetta Greening, DO;  [facility];  Service: Endoscopy;  Laterality: N/A;  730am   HOT HEMOSTASIS  08/27/2022   Procedure: HOT HEMOSTASIS (ARGON PLASMA COAGULATION/BICAP);  Surgeon: Vinetta Greening, DO;  [facility];  Service: Endoscopy;;   HOT HEMOSTASIS  03/25/2023   Procedure: HOT HEMOSTASIS (ARGON PLASMA COAGULATION/BICAP);  Surgeon: Vinetta Greening, DO;  [facility];  Service: Endoscopy;;   POLYPECTOMY  06/13/2015   Procedure: POLYPECTOMY;  Surgeon: Suzette Espy, MD;  [facility];  Service: Endoscopy;;  sigmoid colon polyp   TOOTH EXTRACTION     TRIGGER FINGER RELEASE Bilateral    TUBAL LIGATION      Current Outpatient Medications  Medication Sig Dispense Refill   albuterol  (PROVENTIL ) (2.5 MG/3ML) 0.083% nebulizer solution Up to every 4 hours as needed (Patient taking differently: Take 2.5 mg by nebulization every 4 (four) hours as needed for wheezing or shortness of breath.) 75 mL 12   albuterol  (VENTOLIN  HFA) 108 (90 Base) MCG/ACT inhaler Inhale 1-2 puffs into the lungs every 6 (six) hours as needed for shortness of breath or wheezing.     ALPRAZolam  (XANAX ) 0.25 MG tablet Take 0.25 mg by mouth daily as needed for anxiety or sleep.     Ascorbic Acid (VITAMIN C) 100 MG tablet Take 100 mg by mouth daily.     clopidogrel  (PLAVIX ) 75 MG tablet TAKE (1) TABLET BY MOUTH ONCE DAILY. (Patient taking differently: Take 75 mg by mouth daily.) 30 tablet 11   ondansetron  (ZOFRAN ) 4 MG tablet Take 1 tablet (4 mg total) by mouth every 6 (six) hours as needed for nausea. 20 tablet 0   pantoprazole  (PROTONIX ) 40 MG tablet Take 1 tablet (40 mg total) by mouth 2 (two) times daily. 60 tablet 11   umeclidinium-vilanterol (ANORO ELLIPTA ) 62.5-25 MCG/ACT AEPB Inhale 1 puff into the lungs daily. 1 each 11   vitamin B-12  (CYANOCOBALAMIN ) 100 MCG tablet Take 100 mcg by mouth daily.     VITAMIN D, CHOLECALCIFEROL, PO Take 1 tablet by mouth daily. One daily     ferrous sulfate  324 (65 Fe) MG TBEC Take 1 tablet (325 mg total) by mouth daily with breakfast. (Patient not taking: Reported on 07/17/2023) 30 tablet 11   senna-docusate (SENOKOT-S) 8.6-50 MG tablet Take 1 tablet by mouth at bedtime as needed for mild constipation. (Patient not taking: Reported on 07/17/2023) 30 tablet 0   No current facility-administered medications for this visit.    Allergies as of 07/17/2023 - Review Complete 07/17/2023  Allergen Reaction Noted   Demerol [meperidine] Nausea And Vomiting 05/03/2015   Oxycontin  [oxycodone  hcl] Nausea And Vomiting 05/03/2015   Fish allergy Nausea Only 06/13/2015   Lavender oil Itching 02/28/2022   Other Itching 02/28/2022    Family History  Problem Relation Age of Onset   Colon cancer Neg Hx    Breast cancer Neg Hx     Social History   Socioeconomic History   Marital status: Married    Spouse name: Not on file   Number of children: Not on file   Years of education: Not on file   Highest education level: Not on file  Occupational History   Not on file  Tobacco Use   Smoking status: Former    Current packs/day: 0.00    Average packs/day: 0.5 packs/day for 55.0 years (27.5 ttl pk-yrs)    Types: Cigarettes    Start date: 05/30/1966    Quit date: 05/29/2021    Years since quitting: 2.1    Passive exposure: Past   Smokeless tobacco: Former   Tobacco comments:    Quit date 05/29/2021  Vaping Use   Vaping status: Never Used  Substance and Sexual Activity   Alcohol use: Yes    Alcohol/week: 0.0 standard drinks of alcohol    Comment: 3 beers a day   Drug use: No   Sexual activity: Yes    Birth control/protection: Surgical  Other Topics Concern   Not on file  Social History Narrative   Not on file   Social Drivers of Health   Financial Resource Strain: Not on file  Food Insecurity: No  Food Insecurity (07/06/2023)   Hunger Vital Sign    Worried About Running Out of Food in the Last Year: Never true    Ran Out of Food in the Last Year: Never true  Transportation Needs: No Transportation Needs (07/06/2023)   PRAPARE - Administrator, Civil Service (Medical): No    Lack of Transportation (Non-Medical): No  Physical Activity: Not on file  Stress: Not on file  Social Connections: Socially Integrated (07/06/2023)   Social Connection and Isolation Panel [NHANES]    Frequency of Communication with Friends and Family: More than three times a week    Frequency of Social Gatherings with Friends and Family: More than three times a week    Attends Religious Services: 1 to 4 times per year    Active Member of [employer] or Organizations: Yes    Attends Banker Meetings: 1 to 4 times per year    Marital Status: Married    Subjective: Review of Systems  Constitutional:  Positive for malaise/fatigue. Negative for chills and fever.  HENT:  Negative for congestion and hearing loss.   Eyes:  Negative for blurred vision and double vision.  Respiratory:  Negative for cough and shortness of breath.   Cardiovascular:  Negative for chest pain and palpitations.  Gastrointestinal:  Negative for abdominal pain, blood in stool, constipation, diarrhea, heartburn, melena and vomiting.  Genitourinary:  Negative for dysuria and urgency.  Musculoskeletal:  Negative for joint pain and myalgias.  Skin:  Negative for itching and rash.  Neurological:  Negative for dizziness and headaches.  Psychiatric/Behavioral:  Negative for depression. The patient is not nervous/anxious.      Objective: BP 134/71   Pulse 92   Temp (!) 97.5 F (36.4 C)   Wt 131 lb (59.4 kg) Comment: per patient  BMI 22.49 kg/m  Physical Exam Constitutional:      Appearance: Normal appearance.  HENT:     Head: Normocephalic and atraumatic.  Eyes:     Extraocular Movements: Extraocular movements intact.      Conjunctiva/sclera: Conjunctivae normal.  Cardiovascular:     Rate and Rhythm: Normal rate and regular rhythm.  Pulmonary:     Effort: Pulmonary effort is normal.     Breath sounds: Normal breath sounds.  Abdominal:     General: Bowel sounds are normal.     Palpations: Abdomen is soft.  Musculoskeletal:        General: No swelling. Normal range of motion.     Cervical back:  Normal range of motion and neck supple.  Skin:    General: Skin is warm and dry.     Coloration: Skin is not jaundiced.  Neurological:     General: No focal deficit present.     Mental Status: She is alert and oriented to person, place, and time.  Psychiatric:        Mood and Affect: Mood normal.        Behavior: Behavior normal.      Assessment/Plan:  1.  Small bowel obstruction-clinically improved.  No nausea or vomiting.  No abdominal pain or distention.  Having regular bowel movements.  I did discuss case further with Dr. Collene Dawson of general surgery who agreed with continued monitoring for now.  2.  Iron  deficiency anemia, angiectasia's-hemoglobin stable.  Patient denies any melena or hematochezia.  Continue to monitor.  3.  Elevated LFTs-likely due to shoch liver in the setting of sepsis/PNA.  These are slowly improving.  We will continue to monitor.  Will recheck on follow-up visit.  07/17/2023 3:35 PM   Disclaimer: This note was dictated with voice recognition software. Similar sounding words can inadvertently be transcribed and may not be corrected upon review.

## 2023-07-18 ENCOUNTER — Other Ambulatory Visit: Payer: Self-pay

## 2023-07-18 DIAGNOSIS — J449 Chronic obstructive pulmonary disease, unspecified: Secondary | ICD-10-CM

## 2023-07-18 MED ORDER — ALBUTEROL SULFATE (2.5 MG/3ML) 0.083% IN NEBU: INHALATION_SOLUTION | RESPIRATORY_TRACT | 12 refills | Status: DC

## 2023-07-18 NOTE — Telephone Encounter (Signed)
 Pt needs a 4 week follow up with Dr Waymond Hailey

## 2023-07-19 DIAGNOSIS — J9621 Acute and chronic respiratory failure with hypoxia: Secondary | ICD-10-CM | POA: Diagnosis not present

## 2023-07-19 DIAGNOSIS — J189 Pneumonia, unspecified organism: Secondary | ICD-10-CM | POA: Diagnosis not present

## 2023-07-19 DIAGNOSIS — N179 Acute kidney failure, unspecified: Secondary | ICD-10-CM | POA: Diagnosis not present

## 2023-07-19 DIAGNOSIS — J441 Chronic obstructive pulmonary disease with (acute) exacerbation: Secondary | ICD-10-CM | POA: Diagnosis not present

## 2023-07-19 DIAGNOSIS — A419 Sepsis, unspecified organism: Secondary | ICD-10-CM | POA: Diagnosis not present

## 2023-07-19 DIAGNOSIS — J440 Chronic obstructive pulmonary disease with acute lower respiratory infection: Secondary | ICD-10-CM | POA: Diagnosis not present

## 2023-07-19 NOTE — Telephone Encounter (Signed)
Pt has been scheduled no further action needed.

## 2023-07-22 DIAGNOSIS — J189 Pneumonia, unspecified organism: Secondary | ICD-10-CM | POA: Diagnosis not present

## 2023-07-22 DIAGNOSIS — A419 Sepsis, unspecified organism: Secondary | ICD-10-CM | POA: Diagnosis not present

## 2023-07-22 DIAGNOSIS — J441 Chronic obstructive pulmonary disease with (acute) exacerbation: Secondary | ICD-10-CM | POA: Diagnosis not present

## 2023-07-22 DIAGNOSIS — J9621 Acute and chronic respiratory failure with hypoxia: Secondary | ICD-10-CM | POA: Diagnosis not present

## 2023-07-22 DIAGNOSIS — J440 Chronic obstructive pulmonary disease with acute lower respiratory infection: Secondary | ICD-10-CM | POA: Diagnosis not present

## 2023-07-22 DIAGNOSIS — N179 Acute kidney failure, unspecified: Secondary | ICD-10-CM | POA: Diagnosis not present

## 2023-07-23 ENCOUNTER — Encounter: Payer: Self-pay | Admitting: Hematology

## 2023-07-23 DIAGNOSIS — K56609 Unspecified intestinal obstruction, unspecified as to partial versus complete obstruction: Secondary | ICD-10-CM | POA: Diagnosis not present

## 2023-07-23 DIAGNOSIS — J449 Chronic obstructive pulmonary disease, unspecified: Secondary | ICD-10-CM | POA: Diagnosis not present

## 2023-07-23 DIAGNOSIS — D509 Iron deficiency anemia, unspecified: Secondary | ICD-10-CM | POA: Diagnosis not present

## 2023-07-23 DIAGNOSIS — J9611 Chronic respiratory failure with hypoxia: Secondary | ICD-10-CM | POA: Diagnosis not present

## 2023-07-23 DIAGNOSIS — Z6823 Body mass index (BMI) 23.0-23.9, adult: Secondary | ICD-10-CM | POA: Diagnosis not present

## 2023-07-24 ENCOUNTER — Encounter: Payer: Self-pay | Admitting: Physician Assistant

## 2023-07-24 ENCOUNTER — Ambulatory Visit (HOSPITAL_COMMUNITY)
Admission: RE | Admit: 2023-07-24 | Discharge: 2023-07-24 | Disposition: A | Discharge status: Home / Self Care | Payer: Medicare Other | Source: Ambulatory Visit | Attending: Vascular Surgery

## 2023-07-24 ENCOUNTER — Ambulatory Visit (HOSPITAL_COMMUNITY)
Admission: RE | Admit: 2023-07-24 | Discharge: 2023-07-24 | Disposition: A | Discharge status: Home / Self Care | Payer: Medicare Other | Source: Ambulatory Visit | Attending: Vascular Surgery | Admitting: Vascular Surgery

## 2023-07-24 ENCOUNTER — Other Ambulatory Visit (HOSPITAL_COMMUNITY): Payer: Self-pay | Admitting: Family Medicine

## 2023-07-24 ENCOUNTER — Ambulatory Visit: Payer: Medicare Other | Attending: Vascular Surgery | Admitting: Physician Assistant

## 2023-07-24 VITALS — BP 181/106 | HR 80 | Temp 98.0°F | Wt 129.1 lb

## 2023-07-24 DIAGNOSIS — I739 Peripheral vascular disease, unspecified: Secondary | ICD-10-CM | POA: Diagnosis not present

## 2023-07-24 DIAGNOSIS — J449 Chronic obstructive pulmonary disease, unspecified: Secondary | ICD-10-CM

## 2023-07-24 DIAGNOSIS — I70213 Atherosclerosis of native arteries of extremities with intermittent claudication, bilateral legs: Secondary | ICD-10-CM

## 2023-07-24 DIAGNOSIS — Z95828 Presence of other vascular implants and grafts: Secondary | ICD-10-CM | POA: Diagnosis not present

## 2023-07-24 LAB — VAS US ABI WITH/WO TBI
Left ABI: 1.04
Right ABI: 1.09

## 2023-07-24 NOTE — Progress Notes (Signed)
 Office Note     CC:  follow up Requesting Provider:  Minus Amel, MD  HPI: Desiree Barber is a 73 y.o. (March 23, 1951) female who presents for surveillance follow up of PAD. She has bilateral kissing common iliac artery stents. These were placed by Dr. Vikki Graves in January of 2024 for disabling claudication. Her symptoms were resolved post intervention. AT her last visit in October her noninvasive studies showed elevated velocities in both of her stents but she was without symptoms and she had palpable pulses on exam. Shorter interval follow up was recommended.   She is here today with non invasive studies. She says overall she is doing well. She was just recently discharged on 4/20 after being in hospital for 8 days with SBO. This resolved with medical management. She says since she was discharged she has had increased SOB. She was treated for PNA during her hospitalization and also had some fluid on her lungs. She says it is getting better.  She otherwise is moving more and denies any pain on ambulation in her legs. No pain at rest or tissue loss.  She is medically managed on Plavix . She is former smoker. Quit in 2023.   Past Medical History:  Diagnosis Date   Anemia    Anxiety    Occasional   Arthritis    COPD (chronic obstructive pulmonary disease) (HCC)    HTN (hypertension)    Iron  deficiency anemia due to chronic blood loss 03/28/2022   Peripheral arterial disease [facility])     Past Surgical History:  Procedure Laterality Date   ABDOMINAL AORTOGRAM W/LOWER EXTREMITY N/A 04/10/2022   Procedure: ABDOMINAL AORTOGRAM W/LOWER EXTREMITY;  Surgeon: Margherita Shell, MD;  [facility];  Service: Cardiovascular;  Laterality: N/A;   BIOPSY  08/27/2022   Procedure: BIOPSY;  Surgeon: Vinetta Greening, DO;  [facility];  Service: Endoscopy;;   BIOPSY  03/25/2023   Procedure: BIOPSY;  Surgeon: Vinetta Greening, DO;  [facility];  Service: Endoscopy;;    COLONOSCOPY  02/23/04   RMR: normal rectum and colon.minimal internal hemorrhoids   COLONOSCOPY WITH PROPOFOL  N/A 06/13/2015   Procedure: COLONOSCOPY WITH PROPOFOL ;  Surgeon: Suzette Espy, MD;  [facility];  Service: Endoscopy;  Laterality: N/A;  1045   COLONOSCOPY WITH PROPOFOL  N/A 08/27/2022   Procedure: COLONOSCOPY WITH PROPOFOL ;  Surgeon: Vinetta Greening, DO;  [facility];  Service: Endoscopy;  Laterality: N/A;  8:45 am, asa 3   ENTEROSCOPY N/A 03/25/2023   Procedure: ENTEROSCOPY;  Surgeon: Vinetta Greening, DO;  [facility];  Service: Endoscopy;  Laterality: N/A;  115pm, asa 3   ESOPHAGOGASTRODUODENOSCOPY (EGD) WITH PROPOFOL  N/A 08/27/2022   Procedure: ESOPHAGOGASTRODUODENOSCOPY (EGD) WITH PROPOFOL ;  Surgeon: Vinetta Greening, DO;  [facility];  Service: Endoscopy;  Laterality: N/A;   GIVENS CAPSULE STUDY N/A 01/29/2023   Procedure: GIVENS CAPSULE STUDY;  Surgeon: Vinetta Greening, DO;  [facility];  Service: Endoscopy;  Laterality: N/A;  730am   HOT HEMOSTASIS  08/27/2022   Procedure: HOT HEMOSTASIS (ARGON PLASMA COAGULATION/BICAP);  Surgeon: Vinetta Greening, DO;  [facility];  Service: Endoscopy;;   HOT HEMOSTASIS  03/25/2023   Procedure: HOT HEMOSTASIS (ARGON PLASMA COAGULATION/BICAP);  Surgeon: Vinetta Greening, DO;  [facility];  Service: Endoscopy;;   POLYPECTOMY  06/13/2015   Procedure: POLYPECTOMY;  Surgeon: Suzette Espy, MD;  [facility];  Service: Endoscopy;;  sigmoid colon polyp   TOOTH EXTRACTION     TRIGGER FINGER RELEASE Bilateral    TUBAL LIGATION      Social History   Socioeconomic History   Marital status: Married    Spouse name: Not on file   Number of children: Not on file   Years of education: Not on file   Highest education level: Not on file  Occupational History   Not on file  Tobacco Use   Smoking status: Former    Current packs/day: 0.00    Average  packs/day: 0.5 packs/day for 55.0 years (27.5 ttl pk-yrs)    Types: Cigarettes    Start date: 05/30/1966    Quit date: 05/29/2021    Years since quitting: 2.1    Passive exposure: Past   Smokeless tobacco: Former   Tobacco comments:    Quit date 05/29/2021  Vaping Use   Vaping status: Never Used  Substance and Sexual Activity   Alcohol use: Yes    Alcohol/week: 0.0 standard drinks of alcohol    Comment: 3 beers a day   Drug use: No   Sexual activity: Yes    Birth control/protection: Surgical  Other Topics Concern   Not on file  Social History Narrative   Not on file   Social Drivers of Health   Financial Resource Strain: Not on file  Food Insecurity: No Food Insecurity (07/06/2023)   Hunger Vital Sign    Worried About Running Out of Food in the Last Year: Never true    Ran Out of Food in the Last Year: Never true  Transportation Needs: No Transportation Needs (07/06/2023)   PRAPARE - Administrator, Civil Service (Medical): No    Lack of Transportation (Non-Medical): No  Physical Activity: Not on file  Stress: Not on file  Social Connections: Socially Integrated (07/06/2023)   Social Connection and Isolation Panel [NHANES]    Frequency of Communication with Friends and Family: More than three times a week    Frequency of Social Gatherings with Friends and Family: More than three times a week    Attends Religious Services: 1 to 4 times per year    Active Member of [employer] or Organizations: Yes    Attends Banker Meetings: 1 to 4 times per year    Marital Status: Married  Catering manager Violence: Not At Risk (07/06/2023)   Humiliation, Afraid, Rape, and Kick questionnaire    Fear of Current or Ex-Partner: No    Emotionally Abused: No    Physically Abused: No    Sexually Abused: No    Family History  Problem Relation Age of Onset   Colon cancer Neg Hx    Breast cancer Neg Hx     Current Outpatient Medications  Medication Sig Dispense Refill    albuterol  (PROVENTIL ) (2.5 MG/3ML) 0.083% nebulizer solution Up to every 4 hours as needed 75 mL 12   albuterol  (VENTOLIN  HFA) 108 (90 Base) MCG/ACT inhaler Inhale 1-2 puffs into the lungs every 6 (six) hours as needed for shortness of breath or wheezing.     ALPRAZolam  (XANAX ) 0.25 MG tablet Take 0.25 mg by mouth daily as needed for anxiety or sleep.     Ascorbic Acid (VITAMIN C) 100 MG tablet Take 100 mg by mouth daily.     clopidogrel  (PLAVIX ) 75 MG tablet TAKE (1) TABLET BY MOUTH ONCE DAILY. (Patient taking differently: Take 75 mg by mouth daily.) 30 tablet 11   ondansetron  (  ZOFRAN ) 4 MG tablet Take 1 tablet (4 mg total) by mouth every 6 (six) hours as needed for nausea. 20 tablet 0   pantoprazole  (PROTONIX ) 40 MG tablet Take 1 tablet (40 mg total) by mouth 2 (two) times daily. 60 tablet 11   umeclidinium-vilanterol (ANORO ELLIPTA ) 62.5-25 MCG/ACT AEPB Inhale 1 puff into the lungs daily. 1 each 11   vitamin B-12 (CYANOCOBALAMIN ) 100 MCG tablet Take 100 mcg by mouth daily.     VITAMIN D, CHOLECALCIFEROL, PO Take 1 tablet by mouth daily. One daily     No current facility-administered medications for this visit.    Allergies  Allergen Reactions   Demerol [Meperidine] Nausea And Vomiting   Oxycontin  [Oxycodone  Hcl] Nausea And Vomiting   Fish Allergy Nausea Only    Only fish from [place]   Lavender Oil Itching    And hives   Other Itching    Flower oils-gets hives too     REVIEW OF SYSTEMS:  [X]  denotes positive finding, [ ]  denotes negative finding Cardiac  Comments:  Chest pain or chest pressure:    Shortness of breath upon exertion:    Short of breath when lying flat:    Irregular heart rhythm:        Vascular    Pain in calf, thigh, or hip brought on by ambulation:    Pain in feet at night that wakes you up from your sleep:     Blood clot in your veins:    Leg swelling:         Pulmonary    Oxygen  at home: X   Productive cough:     Wheezing:         Neurologic     Sudden weakness in arms or legs:     Sudden numbness in arms or legs:     Sudden onset of difficulty speaking or slurred speech:    Temporary loss of vision in one eye:     Problems with dizziness:         Gastrointestinal    Blood in stool:     Vomited blood:         Genitourinary    Burning when urinating:     Blood in urine:        Psychiatric    Major depression:         Hematologic    Bleeding problems:    Problems with blood clotting too easily:        Skin    Rashes or ulcers:        Constitutional    Fever or chills:      PHYSICAL EXAMINATION:  Vitals:   07/24/23 1051  Pulse: 84  SpO2: 94%    General:  WDWN in NAD; vital signs documented above Gait: Normal HENT: WNL, normocephalic Pulmonary: Labored breathing, On 2L  Cardiac: regular HR Abdomen: soft, distended Vascular Exam/Pulses: 2+ femoral, 2+ DP pulses bilaterally, feet warm and well perfused Extremities: without ischemic changes, without Gangrene , without cellulitis; without open wounds;  Musculoskeletal: no muscle wasting or atrophy  Neurologic: A&O X 3 Psychiatric:  The pt has Normal affect.   Non-Invasive Vascular Imaging:   +-------+-----------+-----------+------------+------------+  ABI/TBIToday's ABIToday's TBIPrevious ABIPrevious TBI  +-------+-----------+-----------+------------+------------+  Right 1.09       0.57       0.97        0.58          +-------+-----------+-----------+------------+------------+  Left  1.04  0.65       0.86        0.51          +-------+-----------+-----------+------------+------------+   Summary:  IVC/Iliac: Right EIA stent 50 - 99 % stenosis at the proximal stent and inflow. The bilateral CIA stents not visualized. Suboptimal exam, further imaging modality may be warranted.   ASSESSMENT/PLAN:: 73 y.o. female here for surveillance follow up of PAD. She has bilateral kissing common iliac artery stents. These were placed by Dr.  Vikki Graves in January of 2024 for disabling claudication. Her symptoms were resolved post intervention. She has had some elevated velocities on non invasive study. These are unchanged today. Her ABI's are slightly improved today. She is not having any symptoms and has palpable pulses on exam. At this time recommend continued closer interval follow up. No intervention recommended at this time.  - continue Plavix  - Encourage walking regimen - congratulated her on her smoking cessation and encourage continued practice - She will follow up again in 6 months with repeat aorto/iliac artery duplex - She knows to follow up sooner if she has any new or worsening symptoms  Deneen Finical, PA-C [facility] [phone]  Clinic MD:   Vikki Graves

## 2023-07-25 ENCOUNTER — Ambulatory Visit (HOSPITAL_COMMUNITY)
Admission: RE | Admit: 2023-07-25 | Discharge: 2023-07-25 | Disposition: A | Discharge status: Home / Self Care | Source: Ambulatory Visit | Attending: Family Medicine | Admitting: Family Medicine

## 2023-07-25 DIAGNOSIS — R0602 Shortness of breath: Secondary | ICD-10-CM | POA: Diagnosis not present

## 2023-07-25 DIAGNOSIS — N179 Acute kidney failure, unspecified: Secondary | ICD-10-CM | POA: Diagnosis not present

## 2023-07-25 DIAGNOSIS — J9811 Atelectasis: Secondary | ICD-10-CM | POA: Diagnosis not present

## 2023-07-25 DIAGNOSIS — J189 Pneumonia, unspecified organism: Secondary | ICD-10-CM | POA: Diagnosis not present

## 2023-07-25 DIAGNOSIS — A419 Sepsis, unspecified organism: Secondary | ICD-10-CM | POA: Diagnosis not present

## 2023-07-25 DIAGNOSIS — J90 Pleural effusion, not elsewhere classified: Secondary | ICD-10-CM | POA: Diagnosis not present

## 2023-07-25 DIAGNOSIS — J440 Chronic obstructive pulmonary disease with acute lower respiratory infection: Secondary | ICD-10-CM | POA: Diagnosis not present

## 2023-07-25 DIAGNOSIS — J9621 Acute and chronic respiratory failure with hypoxia: Secondary | ICD-10-CM | POA: Diagnosis not present

## 2023-07-25 DIAGNOSIS — J449 Chronic obstructive pulmonary disease, unspecified: Secondary | ICD-10-CM | POA: Insufficient documentation

## 2023-07-25 DIAGNOSIS — J441 Chronic obstructive pulmonary disease with (acute) exacerbation: Secondary | ICD-10-CM | POA: Diagnosis not present

## 2023-07-25 DIAGNOSIS — R918 Other nonspecific abnormal finding of lung field: Secondary | ICD-10-CM | POA: Diagnosis not present

## 2023-07-26 DIAGNOSIS — J189 Pneumonia, unspecified organism: Secondary | ICD-10-CM | POA: Diagnosis not present

## 2023-07-26 DIAGNOSIS — A419 Sepsis, unspecified organism: Secondary | ICD-10-CM | POA: Diagnosis not present

## 2023-07-26 DIAGNOSIS — J441 Chronic obstructive pulmonary disease with (acute) exacerbation: Secondary | ICD-10-CM | POA: Diagnosis not present

## 2023-07-26 DIAGNOSIS — J440 Chronic obstructive pulmonary disease with acute lower respiratory infection: Secondary | ICD-10-CM | POA: Diagnosis not present

## 2023-07-29 ENCOUNTER — Encounter: Payer: Self-pay | Admitting: Acute Care

## 2023-07-29 DIAGNOSIS — J441 Chronic obstructive pulmonary disease with (acute) exacerbation: Secondary | ICD-10-CM | POA: Diagnosis not present

## 2023-07-29 DIAGNOSIS — N179 Acute kidney failure, unspecified: Secondary | ICD-10-CM | POA: Diagnosis not present

## 2023-07-29 DIAGNOSIS — J440 Chronic obstructive pulmonary disease with acute lower respiratory infection: Secondary | ICD-10-CM | POA: Diagnosis not present

## 2023-07-29 DIAGNOSIS — J189 Pneumonia, unspecified organism: Secondary | ICD-10-CM | POA: Diagnosis not present

## 2023-07-29 DIAGNOSIS — J9621 Acute and chronic respiratory failure with hypoxia: Secondary | ICD-10-CM | POA: Diagnosis not present

## 2023-07-29 DIAGNOSIS — A419 Sepsis, unspecified organism: Secondary | ICD-10-CM | POA: Diagnosis not present

## 2023-08-01 ENCOUNTER — Other Ambulatory Visit: Payer: Self-pay | Admitting: *Deleted

## 2023-08-01 DIAGNOSIS — J9621 Acute and chronic respiratory failure with hypoxia: Secondary | ICD-10-CM | POA: Diagnosis not present

## 2023-08-01 DIAGNOSIS — J441 Chronic obstructive pulmonary disease with (acute) exacerbation: Secondary | ICD-10-CM | POA: Diagnosis not present

## 2023-08-01 DIAGNOSIS — J189 Pneumonia, unspecified organism: Secondary | ICD-10-CM | POA: Diagnosis not present

## 2023-08-01 DIAGNOSIS — N179 Acute kidney failure, unspecified: Secondary | ICD-10-CM | POA: Diagnosis not present

## 2023-08-01 DIAGNOSIS — Z95828 Presence of other vascular implants and grafts: Secondary | ICD-10-CM

## 2023-08-01 DIAGNOSIS — J440 Chronic obstructive pulmonary disease with acute lower respiratory infection: Secondary | ICD-10-CM | POA: Diagnosis not present

## 2023-08-01 DIAGNOSIS — I739 Peripheral vascular disease, unspecified: Secondary | ICD-10-CM

## 2023-08-01 DIAGNOSIS — A419 Sepsis, unspecified organism: Secondary | ICD-10-CM | POA: Diagnosis not present

## 2023-08-06 ENCOUNTER — Ambulatory Visit (HOSPITAL_COMMUNITY): Admission: RE | Admit: 2023-08-06 | Source: Ambulatory Visit

## 2023-08-06 DIAGNOSIS — J189 Pneumonia, unspecified organism: Secondary | ICD-10-CM | POA: Diagnosis not present

## 2023-08-06 DIAGNOSIS — N179 Acute kidney failure, unspecified: Secondary | ICD-10-CM | POA: Diagnosis not present

## 2023-08-06 DIAGNOSIS — J9621 Acute and chronic respiratory failure with hypoxia: Secondary | ICD-10-CM | POA: Diagnosis not present

## 2023-08-06 DIAGNOSIS — J440 Chronic obstructive pulmonary disease with acute lower respiratory infection: Secondary | ICD-10-CM | POA: Diagnosis not present

## 2023-08-06 DIAGNOSIS — J441 Chronic obstructive pulmonary disease with (acute) exacerbation: Secondary | ICD-10-CM | POA: Diagnosis not present

## 2023-08-06 DIAGNOSIS — A419 Sepsis, unspecified organism: Secondary | ICD-10-CM | POA: Diagnosis not present

## 2023-08-11 NOTE — Progress Notes (Signed)
 Desiree Barber, female    DOB: Jul 04, 1950    MRN: [id number]  Brief patient profile:  72  yowf  quit smoking 05/29/21/MM with doe/cough which improved p quit smoking  referred to pulmonary clinic in [place]  06/13/2021 by Desiree Erichsen PA for ? 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    History of Present Illness  06/13/2021  Pulmonary/ 1st office eval/ Desiree Barber / [name]  Chief Complaint  Patient presents with   Consult    Consult for COPD with hypoxia and need for O2 form [facility]   Dyspnea:  12-15 min walks 1/2 mile some inclines s stopping on incruse x > year. 02 sats now ok p stops but not checking at peak ex  Cough: none  Sleep: well / on side bed is flat  SABA use: none   Rec Congratulations on not smoking -  it's the most important aspect of your care.  Ok to try off incruse to see if it affects your wind /breathing when walking and if worse consider restarting incruse or substituting anoro (the highest octane)  > did fine off incruse      07/10/2022  f/u ov/[name]/Desiree Barber re: copd /bronchiectais maint on anoro   Chief Complaint  Patient presents with   Follow-up    Pt f/u for increased SOB - alternating between Anoro and Trelegy   Dyspnea:  pushing cart [employer] fine / not checking sats with ex  Cough: better now  Sleeping: flat bed/ one pillow  SABA use: not using  02: POC not using  Rec Plan A = Automatic = Always=    Anoro one click each am - tug initially  Plan B = Backup (to supplement plan A, not to replace it) Only use your albuterol  inhaler as a rescue medication  Plan C = Crisis (instead of Plan B but only if Plan B stops working) - only use your albuterol  nebulizer if you first try Plan B  Keep up with your vaccinations  My office will be contacting you by phone for referral to pulmonary rehab  - if you don't hear back from my office within one week please call us  back or notify us  thru MyChart and we'll address it right away.   Late add: Make sure you check your oxygen  saturation  AT  your highest level of activity (not after you stop)  to  be sure it stays over 90%     12/13/2022  f/u ov/[name]/Desiree Barber re: copd/ bronchiectasis  maint on Anoro   Chief Complaint  Patient presents with   Follow-up    6 month follow up   Dyspnea:  pulling weeds on knees due to abd fullness attributed to over eating ever since she quit smoking  Pulmonary Rehab to[facility] [place] and sats low 90s Cough: no Sleeping: flat bed/ one pillow s resp cc  SABA use: not helpful hfa or neb  02: none - has POC using at rest  Rec Plan A = Automatic = Always=    Anoro one click each am  - take two good strong drags Plan B = Backup (to supplement plan A, not to replace it) Only use your albuterol  inhaler as a rescue medication Plan C = Crisis (instead of Plan B but only if Plan B stops working) - only use your albuterol  nebulizer if you first try Plan B  Also  Ok to try albuterol  15 min before an activity (on alternating  days)  that you know would usually make you short of breath Please schedule a follow up visit in 6  months but call sooner if needed    06/27/2023  f/u ov/[place] office/Desiree Barber re: COPD / bronchiectasis  maint on Anoro   MPNs by [facility]  Cc c sob worse since first of year  Dyspnea:  still doing some gardening / housework and cooking  Cough: none  Sleeping: flat bed one pillow s    resp cc  SABA use: twice daily / one neb  02: has POC  not using much as doesn't help doe when she does  Rec Also  Ok to try albuterol  15 min before an activity (on alternating days)  that you know would usually make you short of breath   Make sure you check your oxygen  saturation  AT  your highest level of activity (not after you stop)   to be sure it stays over 90%  Eos 0.3   Please schedule a follow up visit in 3 months but call sooner if needed - PFTs on return    08/15/2023  f/u ov/ office/Desiree Barber re: copd/ bronchiectasis never had  pfts maint on trelegy   Chief Complaint  Patient presents with   Shortness of Breath   Dyspnea:  improved back to baseline s/p admit for SBO s surgery / to shop from house is 15 % grade uphill s stopping  Cough: none  despite sneezing from pollen Sleeping: bed is flat one pillow s  resp cc  SABA use: neb daily  02: has it not needing   Lung cancer screening: April 2025    No obvious day to day or daytime variability or assoc excess/ purulent sputum or mucus plugs or hemoptysis or cp or chest tightness, subjective wheeze or overt sinus or hb symptoms.    Also denies any obvious fluctuation of symptoms with weather or environmental changes or other aggravating or alleviating factors except as outlined above   No unusual exposure hx or h/o childhood pna/ asthma or knowledge of premature birth.  Current Allergies, Complete Past Medical History, Past Surgical History, Family History, and Social History were reviewed in [employer] record.  ROS  The following are not active complaints unless bolded Hoarseness, sore throat, dysphagia, dental problems, itching, sneezing,  nasal congestion or discharge of excess mucus or purulent secretions, ear ache,   fever, chills, sweats, unintended wt loss or wt gain, classically pleuritic or exertional cp,  orthopnea pnd or arm/hand swelling  or leg swelling, presyncope, palpitations, abdominal pain, anorexia, nausea, vomiting, diarrhea  or change in bowel habits or change in bladder habits, change in stools or change in urine, dysuria, hematuria,  rash, arthralgias, visual complaints, headache, numbness, weakness or ataxia or problems with walking or coordination,  change in mood or  memory.        Current Meds  Medication Sig   ALPRAZolam  (XANAX ) 0.25 MG tablet Take 0.25 mg by mouth daily as needed for anxiety or sleep.   Ascorbic Acid (VITAMIN C) 100 MG tablet Take 100 mg by mouth daily.   clopidogrel  (PLAVIX ) 75 MG tablet TAKE (1)  TABLET BY MOUTH ONCE DAILY. (Patient taking differently: Take 75 mg by mouth daily.)   ondansetron  (ZOFRAN ) 4 MG tablet Take 1 tablet (4 mg total) by mouth every 6 (six) hours as needed for nausea.   pantoprazole  (PROTONIX ) 40 MG tablet Take 1 tablet (40 mg total) by mouth 2 (two) times daily.  vitamin B-12 (CYANOCOBALAMIN ) 100 MCG tablet Take 100 mcg by mouth daily.   VITAMIN D, CHOLECALCIFEROL, PO Take 1 tablet by mouth daily. One daily     Up to every 4 hours as needed   [  albuterol  (VENTOLIN  HFA) 108 (90 Base) MCG/ACT inhaler Inhale 1-2 puffs into the lungs every 6 (six) hours as needed for shortness of breath or wheezing.   umeclidinium-vilanterol (ANORO ELLIPTA ) 62.5-25 MCG/ACT AEPB Inhale 1 puff into the lungs daily.                     Past Medical History:  Diagnosis Date   Anxiety    Occasional   Arthritis    COPD (chronic obstructive pulmonary disease) (HCC)    HTN (hypertension)        Objective:    Wts  08/15/2023       127  06/27/2023         131  12/13/2022       131  07/10/2022       125  02/14/2022     117   01/05/22 119 lb 6.4 oz (54.2 kg)  01/05/22 119 lb 12.8 oz (54.3 kg)  12/24/21 115 lb 3.2 oz (52.3 kg)    Vital signs reviewed  08/15/2023  - Note at rest 02 sats  91% on RA   General appearance:    amb wf nad     HEENT : Oropharynx  clear   Nasal turbinates nl    NECK :  without  apparent JVD/ palpable Nodes/TM    LUNGS: no acc muscle use,  Mild barrel  contour chest wall with bilateral  Distant bs s audible wheeze and  without cough on insp or exp maneuvers  and mild  Hyperresonant  to  percussion bilaterally     CV:  RRR  no s3 or murmur or increase in P2, and no edema   ABD:  soft and nontender with pos end  insp Hoover's  in the supine position.  No bruits or organomegaly appreciated   MS:  Nl gait/ ext warm without deformities Or obvious joint restrictions  calf tenderness, cyanosis or - mild clubbing     SKIN: warm and dry without  lesions    NEURO:  alert, approp, nl sensorium with  no motor or cerebellar deficits apparent.        I personally reviewed images and agree with radiology impression as follows:   Chest CTa   07/08/23 1. No filling defect is identified in the pulmonary arterial tree to suggest pulmonary embolus. 2. 40% superior endplate compression fracture at T6 is likely acute or subacute given the associated sclerosis, and was not present on 04/08/2023. No significant posterior bony retropulsion. 3. Trace bilateral pleural effusions with passive atelectasis. 4. Airway plugging and volume loss in the right middle lobe and inferiorly in the anterior right upper lobe. Mild airway plugging in the lingula. Some of the right upper lobe nodularity shown previously on 04/08/2023 has resolved but other areas of nodularity mostly reflecting mucous plugging are observed. These are likely inflammatory. 5. Nonspecific fullness of the gastroesophageal junction. Possible small type 1 hiatal hernia. 6.  Aortic Atherosclerosis (ICD10-I70.0).  Coronary atherosclerosis.    CXR PA and Lateral:   08/15/2023 :    I personally reviewed images and impression is as follows:       Marked improvement vs priors/ still streaky atx RML     Assessment

## 2023-08-12 DIAGNOSIS — J440 Chronic obstructive pulmonary disease with acute lower respiratory infection: Secondary | ICD-10-CM | POA: Diagnosis not present

## 2023-08-12 DIAGNOSIS — J9621 Acute and chronic respiratory failure with hypoxia: Secondary | ICD-10-CM | POA: Diagnosis not present

## 2023-08-12 DIAGNOSIS — J189 Pneumonia, unspecified organism: Secondary | ICD-10-CM | POA: Diagnosis not present

## 2023-08-12 DIAGNOSIS — J441 Chronic obstructive pulmonary disease with (acute) exacerbation: Secondary | ICD-10-CM | POA: Diagnosis not present

## 2023-08-12 DIAGNOSIS — N179 Acute kidney failure, unspecified: Secondary | ICD-10-CM | POA: Diagnosis not present

## 2023-08-12 DIAGNOSIS — A419 Sepsis, unspecified organism: Secondary | ICD-10-CM | POA: Diagnosis not present

## 2023-08-15 ENCOUNTER — Encounter: Payer: Self-pay | Admitting: Internal Medicine

## 2023-08-15 ENCOUNTER — Ambulatory Visit (HOSPITAL_COMMUNITY)
Admission: RE | Admit: 2023-08-15 | Discharge: 2023-08-15 | Disposition: A | Discharge status: Home / Self Care | Source: Ambulatory Visit | Attending: Internal Medicine | Admitting: Internal Medicine

## 2023-08-15 ENCOUNTER — Ambulatory Visit (INDEPENDENT_AMBULATORY_CARE_PROVIDER_SITE_OTHER): Admitting: Internal Medicine

## 2023-08-15 VITALS — BP 147/80 | HR 80 | Ht 64.0 in | Wt 127.4 lb

## 2023-08-15 DIAGNOSIS — R918 Other nonspecific abnormal finding of lung field: Secondary | ICD-10-CM | POA: Diagnosis not present

## 2023-08-15 DIAGNOSIS — Z87891 Personal history of nicotine dependence: Secondary | ICD-10-CM | POA: Diagnosis not present

## 2023-08-15 DIAGNOSIS — J90 Pleural effusion, not elsewhere classified: Secondary | ICD-10-CM | POA: Diagnosis not present

## 2023-08-15 DIAGNOSIS — J449 Chronic obstructive pulmonary disease, unspecified: Secondary | ICD-10-CM

## 2023-08-15 DIAGNOSIS — J471 Bronchiectasis with (acute) exacerbation: Secondary | ICD-10-CM

## 2023-08-15 MED ORDER — ALBUTEROL SULFATE (2.5 MG/3ML) 0.083% IN NEBU: INHALATION_SOLUTION | RESPIRATORY_TRACT | 12 refills | Status: DC

## 2023-08-15 MED ORDER — ALBUTEROL SULFATE HFA 108 (90 BASE) MCG/ACT IN AERS
1.0000 | INHALATION_SPRAY | RESPIRATORY_TRACT | 11 refills | Status: AC | PRN
Start: 2023-08-15 — End: ?

## 2023-08-15 MED ORDER — UMECLIDINIUM-VILANTEROL 62.5-25 MCG/ACT IN AEPB: 1.0000 | INHALATION_SPRAY | Freq: Every day | RESPIRATORY_TRACT | 11 refills | Status: AC

## 2023-08-15 NOTE — Assessment & Plan Note (Addendum)
 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  - referred to pulmonary rehab 07/10/2022 > tol well as  of 12/13/2022  - 12/13/2022 changed trelegy to anoro due to wt gain on trelegy   Recent "flare" of copd assoc with sbo with abd swelling and R effusion likely "sympathetic" and clear now on exam so rec  1) no change anoro for group B copd 2) check cxr for residual effusion >>> resolved 3) ABC action plan reviewed  F/u 3 m with resp meds     Each maintenance medication was reviewed in detail including emphasizing most importantly the difference between maintenance and prns and under what circumstances the prns are to be triggered using an action plan format where appropriate.  Total time for H and P, chart review, counseling, reviewing hfa/dpi/ neb  device(s) and generating customized AVS unique to this office visit / same day charting = 30 min post hosp f/u

## 2023-08-15 NOTE — Patient Instructions (Addendum)
 My office will be contacting you by phone for referral for PFTs    - if you don't hear back from my office within one week,  please call us  back or notify us  thru MyChart and we'll address it right away.    Plan A = Automatic = Always=    Anoro one click each   Plan B = Backup (to supplement plan A, not to replace it) Only use your albuterol  inhaler as a rescue medication to be used if you can't catch your breath by resting or doing a relaxed purse lip breathing pattern.  - The less you use it, the better it will work when you need it. - Ok to use the inhaler up to 2 puffs  every 4 hours if you must but call for appointment if use goes up over your usual need - Don't leave home without it !!  (think of it like the spare tire for your car)   Plan C = Crisis (instead of Plan B but only if Plan B stops working) - only use your albuterol  nebulizer if you first try Plan B and it fails to help > ok to use the nebulizer up to every 4 hours but if start needing it regularly call for immediate appointment   Also  Ok to try albuterol  15 min before an activity (on alternating days)  that you know would usually make you short of breath and see if it makes any difference and if makes none then don't take albuterol  after activity unless you can't catch your breath as this means it's the resting that helps, not the albuterol .  Please schedule a follow up visit in 3 months but call sooner if needed  with all respiratory medications /inhalers/ solutions in hand so we can verify exactly what you are taking. This includes all medications from all doctors and over the counters

## 2023-08-16 ENCOUNTER — Encounter: Payer: Self-pay | Admitting: Hematology

## 2023-08-16 ENCOUNTER — Telehealth: Payer: Self-pay

## 2023-08-16 ENCOUNTER — Other Ambulatory Visit (HOSPITAL_COMMUNITY): Payer: Self-pay

## 2023-08-16 DIAGNOSIS — J9621 Acute and chronic respiratory failure with hypoxia: Secondary | ICD-10-CM | POA: Diagnosis not present

## 2023-08-16 DIAGNOSIS — Z9181 History of falling: Secondary | ICD-10-CM | POA: Diagnosis not present

## 2023-08-16 DIAGNOSIS — R918 Other nonspecific abnormal finding of lung field: Secondary | ICD-10-CM | POA: Diagnosis not present

## 2023-08-16 DIAGNOSIS — J90 Pleural effusion, not elsewhere classified: Secondary | ICD-10-CM | POA: Diagnosis not present

## 2023-08-16 DIAGNOSIS — Z7902 Long term (current) use of antithrombotics/antiplatelets: Secondary | ICD-10-CM | POA: Diagnosis not present

## 2023-08-16 DIAGNOSIS — I700 Atherosclerosis of aorta: Secondary | ICD-10-CM | POA: Diagnosis not present

## 2023-08-16 DIAGNOSIS — Z9981 Dependence on supplemental oxygen: Secondary | ICD-10-CM | POA: Diagnosis not present

## 2023-08-16 DIAGNOSIS — K8020 Calculus of gallbladder without cholecystitis without obstruction: Secondary | ICD-10-CM | POA: Diagnosis not present

## 2023-08-16 DIAGNOSIS — I119 Hypertensive heart disease without heart failure: Secondary | ICD-10-CM | POA: Diagnosis not present

## 2023-08-16 DIAGNOSIS — J449 Chronic obstructive pulmonary disease, unspecified: Secondary | ICD-10-CM

## 2023-08-16 DIAGNOSIS — J189 Pneumonia, unspecified organism: Secondary | ICD-10-CM | POA: Diagnosis not present

## 2023-08-16 DIAGNOSIS — J440 Chronic obstructive pulmonary disease with acute lower respiratory infection: Secondary | ICD-10-CM | POA: Diagnosis not present

## 2023-08-16 DIAGNOSIS — K760 Fatty (change of) liver, not elsewhere classified: Secondary | ICD-10-CM | POA: Diagnosis not present

## 2023-08-16 DIAGNOSIS — N179 Acute kidney failure, unspecified: Secondary | ICD-10-CM | POA: Diagnosis not present

## 2023-08-16 DIAGNOSIS — J9811 Atelectasis: Secondary | ICD-10-CM | POA: Diagnosis not present

## 2023-08-16 DIAGNOSIS — J441 Chronic obstructive pulmonary disease with (acute) exacerbation: Secondary | ICD-10-CM | POA: Diagnosis not present

## 2023-08-16 DIAGNOSIS — I739 Peripheral vascular disease, unspecified: Secondary | ICD-10-CM | POA: Diagnosis not present

## 2023-08-16 DIAGNOSIS — A419 Sepsis, unspecified organism: Secondary | ICD-10-CM | POA: Diagnosis not present

## 2023-08-16 MED ORDER — ALBUTEROL SULFATE (2.5 MG/3ML) 0.083% IN NEBU: INHALATION_SOLUTION | RESPIRATORY_TRACT | 12 refills | Status: AC

## 2023-08-16 NOTE — Telephone Encounter (Signed)
*  Pulm  Pharmacy Patient Advocate Encounter   Received notification from CoverMyMeds that prior authorization for Albuterol  Sulfate (2.5 MG/3ML)0.083% nebulizer solution  is required/requested.   Insurance verification completed.   The patient is insured through [facility] .   Per test claim: Medication is not eligible for pharmacy benefits and must be billed through medical insurance. As our team only handles pharmacy related prior auths, medical PA's must be submitted by the clinic. Thank you

## 2023-08-16 NOTE — Telephone Encounter (Signed)
 Resubmitted rx for albuterol  neb with dx code this time and asked that the pharmacy file her part B.

## 2023-08-20 DIAGNOSIS — J189 Pneumonia, unspecified organism: Secondary | ICD-10-CM | POA: Diagnosis not present

## 2023-08-20 DIAGNOSIS — A419 Sepsis, unspecified organism: Secondary | ICD-10-CM | POA: Diagnosis not present

## 2023-08-20 DIAGNOSIS — N179 Acute kidney failure, unspecified: Secondary | ICD-10-CM | POA: Diagnosis not present

## 2023-08-20 DIAGNOSIS — J9621 Acute and chronic respiratory failure with hypoxia: Secondary | ICD-10-CM | POA: Diagnosis not present

## 2023-08-20 DIAGNOSIS — J441 Chronic obstructive pulmonary disease with (acute) exacerbation: Secondary | ICD-10-CM | POA: Diagnosis not present

## 2023-08-20 DIAGNOSIS — J440 Chronic obstructive pulmonary disease with acute lower respiratory infection: Secondary | ICD-10-CM | POA: Diagnosis not present

## 2023-08-22 ENCOUNTER — Encounter: Payer: Self-pay | Admitting: Internal Medicine

## 2023-08-23 ENCOUNTER — Ambulatory Visit: Payer: Self-pay | Admitting: Internal Medicine

## 2023-08-27 ENCOUNTER — Ambulatory Visit (HOSPITAL_COMMUNITY)
Admission: RE | Admit: 2023-08-27 | Discharge: 2023-08-27 | Disposition: A | Discharge status: Home / Self Care | Source: Ambulatory Visit | Attending: Internal Medicine | Admitting: Internal Medicine

## 2023-08-27 DIAGNOSIS — R0609 Other forms of dyspnea: Secondary | ICD-10-CM | POA: Insufficient documentation

## 2023-08-27 LAB — PULMONARY FUNCTION TEST
DL/VA % pred: 64 %
DL/VA: 2.69 ml/min/mmHg/L
DLCO unc % pred: 37 %
DLCO unc: 7.24 ml/min/mmHg
FEF 25-75 Post: 0.28 L/s
FEF 25-75 Pre: 0.22 L/s
FEF2575-%Change-Post: 28 %
FEF2575-%Pred-Post: 15 %
FEF2575-%Pred-Pre: 12 %
FEV1-%Change-Post: 12 %
FEV1-%Pred-Post: 38 %
FEV1-%Pred-Pre: 34 %
FEV1-Post: 0.85 L
FEV1-Pre: 0.76 L
FEV1FVC-%Change-Post: 1 %
FEV1FVC-%Pred-Pre: 54 %
FEV6-%Change-Post: 7 %
FEV6-%Pred-Post: 58 %
FEV6-%Pred-Pre: 54 %
FEV6-Post: 1.65 L
FEV6-Pre: 1.53 L
FEV6FVC-%Change-Post: -2 %
FEV6FVC-%Pred-Post: 84 %
FEV6FVC-%Pred-Pre: 87 %
FVC-%Change-Post: 10 %
FVC-%Pred-Post: 69 %
FVC-%Pred-Pre: 62 %
FVC-Post: 2.04 L
FVC-Pre: 1.83 L
Post FEV1/FVC ratio: 42 %
Post FEV6/FVC ratio: 81 %
Pre FEV1/FVC ratio: 41 %
Pre FEV6/FVC Ratio: 83 %
RV % pred: 128 %
RV: 2.86 L
TLC % pred: 90 %
TLC: 4.57 L

## 2023-08-27 MED ORDER — ALBUTEROL SULFATE (2.5 MG/3ML) 0.083% IN NEBU
2.5000 mg | INHALATION_SOLUTION | Freq: Once | RESPIRATORY_TRACT | Status: AC
  Administered 2023-08-27: 2.5 mg via RESPIRATORY_TRACT

## 2023-09-01 ENCOUNTER — Ambulatory Visit: Payer: Self-pay | Admitting: Internal Medicine

## 2023-09-02 NOTE — Progress Notes (Signed)
Spoke with patient regarding results and recommendations. Patient voiced understanding. No further questions or concerns.

## 2023-09-25 ENCOUNTER — Encounter: Payer: Self-pay | Admitting: Internal Medicine

## 2023-09-25 ENCOUNTER — Ambulatory Visit (INDEPENDENT_AMBULATORY_CARE_PROVIDER_SITE_OTHER): Admitting: Internal Medicine

## 2023-09-25 VITALS — BP 135/82 | HR 77 | Temp 97.5°F | Ht 65.0 in | Wt 132.6 lb

## 2023-09-25 DIAGNOSIS — Z8719 Personal history of other diseases of the digestive system: Secondary | ICD-10-CM | POA: Diagnosis not present

## 2023-09-25 DIAGNOSIS — D509 Iron deficiency anemia, unspecified: Secondary | ICD-10-CM

## 2023-09-25 DIAGNOSIS — R7989 Other specified abnormal findings of blood chemistry: Secondary | ICD-10-CM | POA: Diagnosis not present

## 2023-09-25 DIAGNOSIS — R159 Full incontinence of feces: Secondary | ICD-10-CM

## 2023-09-25 DIAGNOSIS — K31819 Angiodysplasia of stomach and duodenum without bleeding: Secondary | ICD-10-CM

## 2023-09-25 DIAGNOSIS — R151 Fecal smearing: Secondary | ICD-10-CM

## 2023-09-25 DIAGNOSIS — D500 Iron deficiency anemia secondary to blood loss (chronic): Secondary | ICD-10-CM | POA: Diagnosis not present

## 2023-09-25 DIAGNOSIS — K2270 Barrett's esophagus without dysplasia: Secondary | ICD-10-CM

## 2023-09-25 NOTE — Patient Instructions (Signed)
 I am going to check blood work today at [facility] to check your blood counts including hemoglobin, iron  levels, and liver test.  We will call with results.  For your stool leakage, recommend performing Kegel exercises at home.  I will put this off for you today.  Also recommend he start taking Metamucil or Benefiber 1-2 times daily to help bulk up your stools.  We can consider hemorrhoid banding if not improved.  Follow-up in 2 to 3 months.  It is always a pleasure seeing you.  Dr. Cindie

## 2023-09-25 NOTE — Progress Notes (Signed)
 Referring Provider: Marvine Rush, MD Primary Care Physician:  Marvine Rush, MD Primary GI:  Dr. Cindie  Chief Complaint  Patient presents with   Follow-up    Patient here today due to having fecal incontinence. Patient says she is having issues with having a small bowel movement every time she urinates. She says she is having some issues with urgency.      HPI:   Desiree Barber is a 73 y.o. female who presents to clinic today for follow up visit.   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 Pylori -Esophageal biopsies consistent with Barrett's -Advised repeat EGD in 5 years for surveillance   Colonoscopy 08/27/22: -Nonbleeding internal hemorrhoids -Pancolonic diverticulosis -Advised repeat in 10 years for screening   Given positive H. pylori on pathology she was treated with bismuth  quadruple (metronidazole , tetracycline ) therapy with PPI briefly increased to twice daily.  Sunsequent H pylori breath test positive. Retreated with salvage therapy with levofloxacin /amoxicillin .  Underwent capsule endoscopy 01/29/2023 which showed multiple angiodysplastic lesions /angioectasia's and erosions present throughout proximal and mid small bowel without active bleeding.  Small bowel enteroscopy 03/25/2023 showed small hiatal hernias, 2 angioectasias in the stomach treated with APC, 7 angioectasias in the duodenum treated with APC.  Most recent CBC from 07/14/2023 showed hemoglobin 13.6.  History of small bowel obstruction: Admitted to [facility] 07/06/2023 after initially presenting with abdominal pain nausea, abdominal distention.  In the ER CT abdomen pelvis showed small bowel obstruction with transition point  in the distal ileum.  Also admitted for acute on chronic respiratory failure in the setting of sepsis/pneumonia.  General surgery was consulted due to small bowel obstruction which resolved with conservative measures.  During her hospitalization, she had a significant rise in her aminotransferases up to as high as AST 2912, ALT 1819, alk phos 149, T. bili normal.  These have steadily improved.  On day of discharge, AST 41, ALT 455, alk phos 78, T. bili 0.9.  Today, complaining about stool leakage.  Happens intermittently small amount of stool in her underwear.  Also notes anytime she has to urinate a small amount of stool will also come out.  Denies any rectal pain or discomfort.  No melena hematochezia.  Past Medical History:  Diagnosis Date   Anemia    Anxiety    Occasional   Arthritis    COPD (chronic obstructive pulmonary disease) (HCC)    HTN (hypertension)    Iron  deficiency anemia due to chronic blood loss 03/28/2022   Peripheral arterial disease [facility])     Past Surgical History:  Procedure Laterality Date   ABDOMINAL AORTOGRAM W/LOWER EXTREMITY N/A 04/10/2022   Procedure: ABDOMINAL AORTOGRAM W/LOWER EXTREMITY;  Surgeon: Serene Gaile ORN, MD;  Location: [facility];  Service: Cardiovascular;  Laterality: N/A;   BIOPSY  08/27/2022   Procedure: BIOPSY;  Surgeon: Cindie Carlin POUR, DO;  Location: [facility];  Service: Endoscopy;;   BIOPSY  03/25/2023   Procedure: BIOPSY;  Surgeon: Cindie Carlin POUR, DO;  [facility]: [facility];  Service: Endoscopy;;   COLONOSCOPY  02/23/04   RMR: normal rectum and colon.minimal internal hemorrhoids   COLONOSCOPY WITH PROPOFOL  N/A 06/13/2015   Procedure: COLONOSCOPY WITH PROPOFOL ;  Surgeon: Lamar CHRISTELLA Hollingshead, MD;  Location: [facility];  Service: Endoscopy;  Laterality: N/A;  1045   COLONOSCOPY WITH PROPOFOL  N/A 08/27/2022   Procedure: COLONOSCOPY WITH PROPOFOL ;  Surgeon: Cindie Carlin POUR, DO;  [facility]: [facility];  Service: Endoscopy;   Laterality: N/A;  8:45 am, asa 3   ENTEROSCOPY N/A 03/25/2023   Procedure: ENTEROSCOPY;  Surgeon: Cindie Carlin POUR, DO;  [facility]: [facility];  Service: Endoscopy;  Laterality: N/A;  115pm, asa 3   ESOPHAGOGASTRODUODENOSCOPY (EGD) WITH PROPOFOL  N/A 08/27/2022   Procedure: ESOPHAGOGASTRODUODENOSCOPY (EGD) WITH PROPOFOL ;  Surgeon: Cindie Carlin POUR, DO;  [facility];  Service: Endoscopy;  Laterality: N/A;   GIVENS CAPSULE STUDY N/A 01/29/2023   Procedure: GIVENS CAPSULE STUDY;  Surgeon: Cindie Carlin POUR, DO;  [facility]: [facility];  Service: Endoscopy;  Laterality: N/A;  730am   HOT HEMOSTASIS  08/27/2022   Procedure: HOT HEMOSTASIS (ARGON PLASMA COAGULATION/BICAP);  Surgeon: Cindie Carlin POUR, DO;  [facility];  Service: Endoscopy;;   HOT HEMOSTASIS  03/25/2023   Procedure: HOT HEMOSTASIS (ARGON PLASMA COAGULATION/BICAP);  Surgeon: Cindie Carlin POUR, DO;  [facility];  Service: Endoscopy;;   POLYPECTOMY  06/13/2015   Procedure: POLYPECTOMY;  Surgeon: Lamar CHRISTELLA Hollingshead, MD;  [facility]: [facility];  Service: Endoscopy;;  sigmoid colon polyp   TOOTH EXTRACTION     TRIGGER FINGER RELEASE Bilateral    TUBAL LIGATION      Current Outpatient Medications  Medication Sig Dispense Refill   albuterol  (PROVENTIL ) (2.5 MG/3ML) 0.083% nebulizer solution Up to every 4 hours as needed 75 mL 12   albuterol  (VENTOLIN  HFA) 108 (90 Base) MCG/ACT inhaler Inhale 1-2 puffs into the lungs every 4 (four) hours as needed for shortness of breath or wheezing. 18 g 11   ALPRAZolam  (XANAX ) 0.25 MG tablet Take 0.25 mg by mouth daily as needed for anxiety or sleep.     Ascorbic Acid (VITAMIN C) 100 MG tablet Take 100 mg by mouth daily.     clopidogrel  (PLAVIX ) 75 MG tablet TAKE (1) TABLET BY MOUTH ONCE DAILY. 30 tablet 11   ferrous sulfate  324 MG TBEC Take 324 mg by mouth daily with breakfast.     ondansetron  (ZOFRAN ) 4 MG tablet Take 1 tablet (4 mg total) by mouth every 6 (six) hours as  needed for nausea. 20 tablet 0   umeclidinium-vilanterol (ANORO ELLIPTA ) 62.5-25 MCG/ACT AEPB Inhale 1 puff into the lungs daily. 1 each 11   vitamin B-12 (CYANOCOBALAMIN ) 100 MCG tablet Take 100 mcg by mouth daily.     VITAMIN D, CHOLECALCIFEROL, PO Take 1 tablet by mouth daily. One daily     pantoprazole  (PROTONIX ) 40 MG tablet Take 1 tablet (40 mg total) by mouth 2 (two) times daily. (Patient not taking: Reported on 09/25/2023) 60 tablet 11   No current facility-administered medications for this visit.    Allergies as of 09/25/2023 - Review Complete 09/25/2023  Allergen Reaction Noted   Demerol [meperidine] Nausea And Vomiting 05/03/2015   Oxycontin  [oxycodone  hcl] Nausea And Vomiting 05/03/2015   Fish allergy Nausea Only 06/13/2015   Lavender oil Itching 02/28/2022   Other Itching 02/28/2022    Family History  Problem Relation Age of Onset   Colon cancer Neg Hx    Breast cancer Neg Hx     Social History   Socioeconomic History   Marital status: Married    Spouse name: Not on file   Number of children: Not on file   Years of education: Not on file  Highest education level: Not on file  Occupational History   Not on file  Tobacco Use   Smoking status: Former    Current packs/day: 0.00    Average packs/day: 0.5 packs/day for 55.0 years (27.5 ttl pk-yrs)    Types: Cigarettes    Start date: 05/30/1966    Quit date: 05/29/2021    Years since quitting: 2.3    Passive exposure: Past   Smokeless tobacco: Former   Tobacco comments:    Quit date 05/29/2021  Vaping Use   Vaping status: Never Used  Substance and Sexual Activity   Alcohol use: Yes    Alcohol/week: 0.0 standard drinks of alcohol    Comment: 3 beers a day   Drug use: No   Sexual activity: Yes    Birth control/protection: Surgical  Other Topics Concern   Not on file  Social History Narrative   Not on file   Social Drivers of Health   Financial Resource Strain: Not on file  Food Insecurity: No Food  Insecurity (07/06/2023)   Hunger Vital Sign    Worried About Running Out of Food in the Last Year: Never true    Ran Out of Food in the Last Year: Never true  Transportation Needs: No Transportation Needs (07/06/2023)   PRAPARE - Administrator, Civil Service (Medical): No    Lack of Transportation (Non-Medical): No  Physical Activity: Not on file  Stress: Not on file  Social Connections: Socially Integrated (07/06/2023)   Social Connection and Isolation Panel    Frequency of Communication with Friends and Family: More than three times a week    Frequency of Social Gatherings with Friends and Family: More than three times a week    Attends Religious Services: 1 to 4 times per year    Active Member of [employer] or Organizations: Yes    Attends Banker Meetings: 1 to 4 times per year    Marital Status: Married    Subjective: Review of Systems  Constitutional:  Positive for malaise/fatigue. Negative for chills and fever.  HENT:  Negative for congestion and hearing loss.   Eyes:  Negative for blurred vision and double vision.  Respiratory:  Negative for cough and shortness of breath.   Cardiovascular:  Negative for chest pain and palpitations.  Gastrointestinal:  Negative for abdominal pain, blood in stool, constipation, diarrhea, heartburn, melena and vomiting.  Genitourinary:  Negative for dysuria and urgency.  Musculoskeletal:  Negative for joint pain and myalgias.  Skin:  Negative for itching and rash.  Neurological:  Negative for dizziness and headaches.  Psychiatric/Behavioral:  Negative for depression. The patient is not nervous/anxious.      Objective: BP 135/82 (BP Location: Left Arm, Patient Position: Sitting, Cuff Size: Normal)   Pulse 77   Temp (!) 97.5 F (36.4 C) (Temporal)   Ht 5' 5 (1.651 m)   Wt 132 lb 9.6 oz (60.1 kg)   BMI 22.07 kg/m  Physical Exam Constitutional:      Appearance: Normal appearance.  HENT:     Head: Normocephalic and  atraumatic.  Eyes:     Extraocular Movements: Extraocular movements intact.     Conjunctiva/sclera: Conjunctivae normal.  Cardiovascular:     Rate and Rhythm: Normal rate and regular rhythm.  Pulmonary:     Effort: Pulmonary effort is normal.     Breath sounds: Normal breath sounds.  Abdominal:     General: Bowel sounds are normal.     Palpations: Abdomen  is soft.  Musculoskeletal:        General: No swelling. Normal range of motion.     Cervical back: Normal range of motion and neck supple.  Skin:    General: Skin is warm and dry.     Coloration: Skin is not jaundiced.  Neurological:     General: No focal deficit present.     Mental Status: She is alert and oriented to person, place, and time.  Psychiatric:        Mood and Affect: Mood normal.        Behavior: Behavior normal.      Assessment/Plan:  1.  Fecal seepage-discussed in depth with patient today.  Kegel exercises printed off for her to do at home.  Will also start on fiber therapy.  Can consider hemorrhoid banding if symptoms not improved.  2.  History of small bowel obstruction-clinically improved.  No nausea or vomiting.  No abdominal pain or distention.  Having regular bowel movements.  I did discuss case further with Dr. Kallie of general surgery who agreed with continued monitoring for now.  3.  Iron  deficiency anemia, angiectasia's-hemoglobin stable.  Patient denies any melena or hematochezia.  Check CBC and iron  studies today.  Call with results.  4.  Elevated LFTs-likely due to shoch liver in the setting of sepsis/PNA.  These are slowly improving.  Will recheck today.  Call with results  Follow-up in 2 to 3 months. 09/25/2023 10:07 AM   Disclaimer: This note was dictated with voice recognition software. Similar sounding words can inadvertently be transcribed and may not be corrected upon review.

## 2023-09-26 ENCOUNTER — Ambulatory Visit: Admitting: Internal Medicine

## 2023-09-26 LAB — COMPREHENSIVE METABOLIC PANEL WITH GFR
AG Ratio: 1.5 (calc) (ref 1.0–2.5)
ALT: 13 U/L (ref 6–29)
AST: 15 U/L (ref 10–35)
Albumin: 4.3 g/dL (ref 3.6–5.1)
Alkaline phosphatase (APISO): 97 U/L (ref 37–153)
BUN: 8 mg/dL (ref 7–25)
CO2: 30 mmol/L (ref 20–32)
Calcium: 9.9 mg/dL (ref 8.6–10.4)
Chloride: 101 mmol/L (ref 98–110)
Creat: 0.76 mg/dL (ref 0.60–1.00)
Globulin: 2.9 g/dL (ref 1.9–3.7)
Glucose, Bld: 84 mg/dL (ref 65–139)
Potassium: 5 mmol/L (ref 3.5–5.3)
Sodium: 139 mmol/L (ref 135–146)
Total Bilirubin: 0.4 mg/dL (ref 0.2–1.2)
Total Protein: 7.2 g/dL (ref 6.1–8.1)
eGFR: 83 mL/min/{1.73_m2} (ref >=60)

## 2023-09-26 LAB — IRON,TIBC AND FERRITIN PANEL
%SAT: 18 % (ref 16–45)
Ferritin: 77 ng/mL (ref 16–288)
Iron: 62 ug/dL (ref 45–160)
TIBC: 346 ug/dL (ref 250–450)

## 2023-09-26 LAB — CBC
HCT: 45.8 % — ABNORMAL HIGH (ref 35.0–45.0)
Hemoglobin: 14.4 g/dL (ref 11.7–15.5)
MCH: 33.2 pg — ABNORMAL HIGH (ref 27.0–33.0)
MCHC: 31.4 g/dL — ABNORMAL LOW (ref 32.0–36.0)
MCV: 105.5 fL — ABNORMAL HIGH (ref 80.0–100.0)
MPV: 9.8 fL (ref 7.5–12.5)
Platelets: 331 10*3/uL (ref 140–400)
RBC: 4.34 10*6/uL (ref 3.80–5.10)
RDW: 13.3 % (ref 11.0–15.0)
WBC: 10.3 10*3/uL (ref 3.8–10.8)

## 2023-10-05 ENCOUNTER — Other Ambulatory Visit: Payer: Self-pay | Admitting: Internal Medicine

## 2023-10-05 DIAGNOSIS — I700 Atherosclerosis of aorta: Secondary | ICD-10-CM

## 2023-10-05 DIAGNOSIS — R0609 Other forms of dyspnea: Secondary | ICD-10-CM

## 2023-10-05 DIAGNOSIS — I2510 Atherosclerotic heart disease of native coronary artery without angina pectoris: Secondary | ICD-10-CM

## 2023-10-07 ENCOUNTER — Encounter: Payer: Self-pay | Admitting: Hematology

## 2023-10-07 NOTE — Progress Notes (Unsigned)
 Cardiology Office Note    Date:  10/09/2023  ID:  Desiree Barber, DOB Dec 14, 1950, MRN [id number] Cardiologist: Previously Dr. Hobart --> wants to follow-up in [facility]  History of Present Illness:    Desiree Barber is a 73 y.o. female with past medical history of coronary calcification by CT (s/p cardiac PET in 04/2022 showing no significant ischemia), PAD (s/p common iliac artery stenting), COPD, HLD and HTN who presents to the office today for 44-month follow-up.  She was last examined by [name], PA in 01/2023 and had recently been hospitalized for anemia. Reported improvement in symptoms at the time of her office visit. Losartan  had recently been discontinued due to soft BP and was not restarted at that time. She was continued on Plavix  75mg  daily.   In the interim, she was hospitalized in 06/2023 for acute hypoxic respiratory failure and sepsis in the setting of COPD and PNA. Also had an SBO which resolved without intervention. She did have an echo during admission which showed a preserved EF of 70-75% with mild LVH, Grade 1 DD, moderately reduced RV function, small pericardial effusion and trivial MR.   In talking with the patient today, she reports overall feeling well since her hospitalization. She remains active in doing chores around her home and is currently cleaning out a two-story shop. She denies any chest pain or palpitations with this. Does have intermittent dyspnea which she feels is due to COPD. She did quit smoking approximately 2 years ago. Denies any specific orthopnea, PND or pitting edema. Says that she did discontinue Crestor  in the interim as her PCP told her this was no longer needed. Does have severe back pain at times and is planning to review with her PCP at her upcoming visit.   Studies Reviewed:   EKG: EKG is not ordered today.  Cardiac PET: 04/2022   The study is normal. The study is low to intermediate risk- TID is (1.23) with no other  increased risk features.   Rest left ventricular function is normal. Rest EF: 72 %. Stress left ventricular function is normal. Stress EF: 77 %. End diastolic cavity size is normal. End systolic cavity size is normal.   Myocardial blood flow was computed to be 1.47ml/g/min at rest and 2.27ml/g/min at stress. Global myocardial blood flow reserve was 1.81 and was mildly abnormal.  Given normal stress flows this is likely due abnormal resting flow.   Coronary calcium  was present on the attenuation correction CT images. Severe coronary calcifications were present. Coronary calcifications were present in the left anterior descending artery, left circumflex artery and right coronary artery distribution(s). Heavy aortic athersoclerosis.  Mild aortic valve calcium .  Mild to moderate mitral annular calcification.   Electronically Signed by Stanly Leavens M.D.  Echocardiogram: 06/2023 IMPRESSIONS     1. Left ventricular ejection fraction, by estimation, is 70 to 75%. The  left ventricle has hyperdynamic function. The left ventricle has no  regional wall motion abnormalities. There is mild concentric left  ventricular hypertrophy. Left ventricular  diastolic parameters are consistent with Grade I diastolic dysfunction  (impaired relaxation).   2. Right ventricular systolic function is moderately reduced. The right  ventricular size is normal. Tricuspid regurgitation signal is inadequate  for assessing PA pressure.   3. A small pericardial effusion is present. The pericardial effusion is  anterior to the right ventricle. There is no evidence of cardiac  tamponade.   4. The mitral valve is normal in structure. Trivial mitral  valve  regurgitation. No evidence of mitral stenosis.   5. The aortic valve is tricuspid. There is moderate calcification of the  aortic valve. Aortic valve regurgitation is not visualized. Aortic valve  sclerosis/calcification is present, without any evidence of aortic   stenosis.   6. The inferior vena cava is dilated in size with >50% respiratory  variability, suggesting right atrial pressure of 8 mmHg.    Physical Exam:   VS:  BP 122/68   Pulse 70   Ht 5' 5 (1.651 m)   Wt 131 lb 12.8 oz (59.8 kg)   SpO2 94%   BMI 21.93 kg/m    Wt Readings from Last 3 Encounters:  10/09/23 131 lb 12.8 oz (59.8 kg)  09/25/23 132 lb 9.6 oz (60.1 kg)  08/15/23 127 lb 6.4 oz (57.8 kg)     GEN: Well nourished, well developed female appearing in no acute distress NECK: No JVD; No carotid bruits CARDIAC: RRR, no murmurs, rubs, gallops RESPIRATORY:  Clear to auscultation without rales, wheezing or rhonchi  ABDOMEN: Appears non-distended. No obvious abdominal masses. EXTREMITIES: No clubbing or cyanosis. No pitting edema.  Distal pedal pulses are 2+ bilaterally.   Assessment and Plan:   1. Coronary artery disease involving native heart without angina pectoris, unspecified vessel or lesion type - She has a history of coronary calcification by CT and cardiac PET in 04/2022 showed no significant ischemia. She remains active at baseline and denies any recent anginal symptoms. She is on Plavix  75 mg daily (on this per Vascular Surgery). Would consider restarting statin therapy as outlined below based off FLP results.   2. History of essential hypertension - BP is at 122/68 during today's visit. She is no longer on antihypertensive therapy. She feels that her blood pressure was previously elevated in the setting of acute back pain. No indication for medical therapy for HTN at this time.   3. Hyperlipidemia LDL goal <70 - LDL was at 77 when checked in 12/2022 by review of [facility] DXA. She is unsure if she was taking Crestor  at that time and reports this was discontinued by her PCP in the interim. Will recheck an FLP. If LDL remains above goal, would recommend restarting Crestor  10 mg daily.  4. PAD (peripheral artery disease) (HCC) - She previously underwent common iliac  artery stenting. Followed by Dr. Sheree with Vascular Surgery. Remains on Plavix  75mg  daily.    Signed, [name], PA-C

## 2023-10-09 ENCOUNTER — Encounter: Payer: Self-pay | Admitting: Student

## 2023-10-09 ENCOUNTER — Ambulatory Visit: Attending: Student | Admitting: Student

## 2023-10-09 VITALS — BP 122/68 | HR 70 | Ht 65.0 in | Wt 131.8 lb

## 2023-10-09 DIAGNOSIS — Z8679 Personal history of other diseases of the circulatory system: Secondary | ICD-10-CM | POA: Diagnosis present

## 2023-10-09 DIAGNOSIS — E785 Hyperlipidemia, unspecified: Secondary | ICD-10-CM | POA: Diagnosis present

## 2023-10-09 DIAGNOSIS — I10 Essential (primary) hypertension: Secondary | ICD-10-CM

## 2023-10-09 DIAGNOSIS — I2510 Atherosclerotic heart disease of native coronary artery without angina pectoris: Secondary | ICD-10-CM | POA: Insufficient documentation

## 2023-10-09 DIAGNOSIS — I739 Peripheral vascular disease, unspecified: Secondary | ICD-10-CM | POA: Diagnosis present

## 2023-10-09 NOTE — Patient Instructions (Signed)
 Medication Instructions:  Your physician recommends that you continue on your current medications as directed. Please refer to the Current Medication list given to you today.  *If you need a refill on your cardiac medications before your next appointment, please call your pharmacy*  Lab Work: Your physician recommends that you return for lab work in: Fasting (Lipid)   If you have labs (blood work) drawn today and your tests are completely normal, you will receive your results only by: MyChart Message (if you have MyChart) OR A paper copy in the mail If you have any lab test that is abnormal or we need to change your treatment, we will call you to review the results.  Testing/Procedures: NONE   Follow-Up: At [facility], you and your health needs are our priority.  As part of our continuing mission to provide you with exceptional heart care, our providers are all part of one team.  This team includes your primary Cardiologist (physician) and Advanced Practice Providers or APPs (Physician Assistants and Nurse Practitioners) who all work together to provide you with the care you need, when you need it.  Your next appointment:   1 year(s)  Provider:   Vishnu Mallipeddi, MD or Laymon Qua, PA-C    We recommend signing up for the patient portal called MyChart.  Sign up information is provided on this After Visit Summary.  MyChart is used to connect with patients for Virtual Visits (Telemedicine).  Patients are able to view lab/test results, encounter notes, upcoming appointments, etc.  Non-urgent messages can be sent to your provider as well.   To learn more about what you can do with MyChart, go to [email or web address].   Other Instructions Thank you for choosing [facility]!

## 2023-10-11 DIAGNOSIS — E785 Hyperlipidemia, unspecified: Secondary | ICD-10-CM | POA: Diagnosis not present

## 2023-10-12 LAB — LIPID PANEL
Chol/HDL Ratio: 3.8 ratio (ref 0.0–4.4)
Cholesterol, Total: 211 mg/dL — ABNORMAL HIGH (ref 100–199)
HDL: 56 mg/dL (ref >39)
LDL Chol Calc (NIH): 137 mg/dL — ABNORMAL HIGH (ref 0–99)
Triglycerides: 103 mg/dL (ref 0–149)
VLDL Cholesterol Cal: 18 mg/dL (ref 5–40)

## 2023-10-15 ENCOUNTER — Ambulatory Visit: Payer: Self-pay | Admitting: Student

## 2023-10-15 DIAGNOSIS — Z79899 Other long term (current) drug therapy: Secondary | ICD-10-CM

## 2023-10-15 DIAGNOSIS — E785 Hyperlipidemia, unspecified: Secondary | ICD-10-CM

## 2023-10-15 MED ORDER — ROSUVASTATIN CALCIUM 10 MG PO TABS: 10.0000 mg | ORAL_TABLET | Freq: Every day | ORAL | 3 refills | Status: AC

## 2023-10-15 NOTE — Telephone Encounter (Signed)
 The patient has been notified of the result and verbalized understanding.  All questions (if any) were answered. Bernett Dorothyann LABOR, RN 10/15/2023 11:36 AM   PCP copied  Rx top [facility] with expected repeat labs the end of October at [facility]

## 2023-10-15 NOTE — Telephone Encounter (Signed)
-----   Message from [name] sent at 10/15/2023  7:27 AM EDT ----- Please let the patient know her cholesterol is above goal with total cholesterol at 211 and LDL 137. Her LDL was previously at 77 when checked in 12/2022 and this was while taking Crestor . Would  recommend restarting Crestor  10 mg daily as her goal LDL should be less than 70 given her peripheral arterial disease. If the patient is in agreement with resuming this, would recheck an FLP and  LFT's in 3 months for reassessment.    ----- Message ----- From: Interface, [facility] Lab Results In Sent: 10/12/2023   5:37 AM EDT To: [name], PA-C

## 2023-10-17 ENCOUNTER — Ambulatory Visit (HOSPITAL_COMMUNITY)
Admission: RE | Admit: 2023-10-17 | Discharge: 2023-10-17 | Disposition: A | Discharge status: Home / Self Care | Source: Ambulatory Visit | Attending: Acute Care | Admitting: Acute Care

## 2023-10-17 DIAGNOSIS — Z87891 Personal history of nicotine dependence: Secondary | ICD-10-CM | POA: Insufficient documentation

## 2023-10-17 DIAGNOSIS — Z122 Encounter for screening for malignant neoplasm of respiratory organs: Secondary | ICD-10-CM | POA: Diagnosis not present

## 2023-10-17 DIAGNOSIS — R918 Other nonspecific abnormal finding of lung field: Secondary | ICD-10-CM | POA: Diagnosis not present

## 2023-10-17 DIAGNOSIS — J439 Emphysema, unspecified: Secondary | ICD-10-CM | POA: Diagnosis not present

## 2023-10-17 DIAGNOSIS — R911 Solitary pulmonary nodule: Secondary | ICD-10-CM | POA: Insufficient documentation

## 2023-10-17 DIAGNOSIS — F17210 Nicotine dependence, cigarettes, uncomplicated: Secondary | ICD-10-CM | POA: Insufficient documentation

## 2023-10-24 ENCOUNTER — Encounter: Payer: Self-pay | Admitting: Internal Medicine

## 2023-10-24 ENCOUNTER — Ambulatory Visit: Payer: Self-pay | Admitting: Internal Medicine

## 2023-10-29 DIAGNOSIS — M545 Low back pain, unspecified: Secondary | ICD-10-CM | POA: Diagnosis not present

## 2023-10-29 DIAGNOSIS — D500 Iron deficiency anemia secondary to blood loss (chronic): Secondary | ICD-10-CM | POA: Diagnosis not present

## 2023-10-29 DIAGNOSIS — E785 Hyperlipidemia, unspecified: Secondary | ICD-10-CM | POA: Diagnosis not present

## 2023-10-29 DIAGNOSIS — Z87891 Personal history of nicotine dependence: Secondary | ICD-10-CM | POA: Diagnosis not present

## 2023-10-29 DIAGNOSIS — I2510 Atherosclerotic heart disease of native coronary artery without angina pectoris: Secondary | ICD-10-CM | POA: Diagnosis not present

## 2023-10-29 DIAGNOSIS — I739 Peripheral vascular disease, unspecified: Secondary | ICD-10-CM | POA: Diagnosis not present

## 2023-10-29 DIAGNOSIS — I10 Essential (primary) hypertension: Secondary | ICD-10-CM | POA: Diagnosis not present

## 2023-10-29 DIAGNOSIS — J432 Centrilobular emphysema: Secondary | ICD-10-CM | POA: Diagnosis not present

## 2023-11-06 ENCOUNTER — Telehealth: Payer: Self-pay | Admitting: Acute Care

## 2023-11-06 ENCOUNTER — Other Ambulatory Visit: Payer: Self-pay

## 2023-11-06 DIAGNOSIS — Z122 Encounter for screening for malignant neoplasm of respiratory organs: Secondary | ICD-10-CM

## 2023-11-06 DIAGNOSIS — Z87891 Personal history of nicotine dependence: Secondary | ICD-10-CM

## 2023-11-06 DIAGNOSIS — R911 Solitary pulmonary nodule: Secondary | ICD-10-CM

## 2023-11-06 NOTE — Telephone Encounter (Addendum)
 Spoke with patient and reviewed recent Lung CT results. Denies recent illness or at time of scan. She is in agreement to complete an 8 week f/u scan scheduled for 12/20/2023. Order placed. Results an plan to PCP. NFN.

## 2023-11-06 NOTE — Telephone Encounter (Signed)
 Please call and see if patient is sick. If they are sick, they need to see PCP and get treated , so 3 month follow up.  If they are not sick repeat scan in 8 weeks.  Thanks so much. Please fax results to PCP with plan.

## 2023-11-07 ENCOUNTER — Encounter: Payer: Self-pay | Admitting: Oncology

## 2023-11-14 ENCOUNTER — Ambulatory Visit: Admitting: Internal Medicine

## 2023-11-15 ENCOUNTER — Encounter: Payer: Self-pay | Admitting: Radiology

## 2023-11-19 NOTE — Progress Notes (Signed)
 Desiree Barber, female    DOB: 09-04-1950    MRN: [id number]  Brief patient profile:  72  yowf  quit smoking 05/29/21/MM with doe/cough which improved p quit smoking  referred to pulmonary clinic in [place]  06/13/2021 by Morene Sous PA for ? 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    History of Present Illness  06/13/2021  Pulmonary/ 1st office eval/ Desiree Barber / [place] [facility]  Chief Complaint  Patient presents with   Consult    Consult for COPD with hypoxia and need for O2 form [facility]   Dyspnea:  12-15 min walks 1/2 mile some inclines s stopping on incruse x > year. 02 sats now ok p stops but not checking at peak ex  Cough: none  Sleep: well / on side bed is flat  SABA use: none   Rec Congratulations on not smoking -  it's the most important aspect of your care.  Ok to try off incruse to see if it affects your wind /breathing when walking and if worse consider restarting incruse or substituting anoro (the highest octane)  > did fine off incruse     12/13/2022  f/u ov/[place] office/Desiree Barber re: copd/ bronchiectasis  maint on Anoro   Chief Complaint  Patient presents with   Follow-up    6 month follow up   Dyspnea:  pulling weeds on knees due to abd fullness attributed to over eating ever since she quit smoking  Pulmonary Rehab tol [facility] and sats low 90s Cough: no Sleeping: flat bed/ one pillow s resp cc  SABA use: not helpful hfa or neb  02: none - has POC using at rest  Rec Plan A = Automatic = Always=    Anoro one click each am  - take two good strong drags Plan B = Backup (to supplement plan A, not to replace it) Only use your albuterol  inhaler as a rescue medication Plan C = Crisis (instead of Plan B but only if Plan B stops working) - only use your albuterol  nebulizer if you first try Plan B  Also  Ok to try albuterol  15 min before an activity (on alternating days)  that you know would usually make you short of breath Please schedule a follow up visit in  6  months but call sooner if needed       08/15/2023  f/u ov/[place] office/Desiree Barber re: copd/ bronchiectasis  maint on trelegy   Chief Complaint  Patient presents with   Shortness of Breath   Dyspnea:  improved back to baseline s/p admit for SBO s surgery / to shop from house is 15 % grade uphill s stopping  Cough: none  despite sneezing from pollen Sleeping: bed is flat one pillow s  resp cc  SABA use: neb daily  02: has it not needing  Lung cancer screening: April 2025  Rec Plan A = Automatic = Always=    Anoro one click each  Plan B = Backup (to supplement plan A, not to replace it) Only use your albuterol  inhaler as a rescue medication Plan C = Crisis (instead of Plan B but only if Plan B stops working) - only use your albuterol  nebulizer if you first try Plan B  Also  Ok to try albuterol  15 min before an activity (on alternating days)  that you know would usually make you short of breath  Please schedule a follow up visit in 3 months but call sooner if needed  with all respiratory medications /inhalers/ solutions in hand    PFT's 08/27/23 FEV1 0.85 (38 % ) ratio 0.42 p 12 % improvement from saba p Anoro prior to study with DLCO 7.24 (37%) and FV curve classically concave   LDSCT  10/17/23  Waxing and waning areas of bronchial wall thickening and mucous plugging with new areas of branching tree-in-bud nodularity in the right lower lobe, findings are most consistent with an infectious or inflammatory bronchiolitis  11/20/2023  f/u ov/[place] office/Desiree Barber re:  COPD GOLD 3/  bronchiectasis  maint on Anoro did not  bring all meds  Chief Complaint  Patient presents with   COPD    DOE    Dyspnea:  no change :  still able to shop to house s stopping  Cough: no  Sleeping: bed is flat/ one pillow s  resp cc  SABA use: misunderstood not following ABC using neb bid  02: has conc at hs not using / also POC not monitoring   Lung cancer screening: due 11/2023    No obvious day to day  or daytime variability or assoc excess/ purulent sputum or mucus plugs or hemoptysis or cp or chest tightness, subjective wheeze or overt sinus or hb symptoms.    Also denies any obvious fluctuation of symptoms with weather or environmental changes or other aggravating or alleviating factors except as outlined above   No unusual exposure hx or h/o childhood pna/ asthma or knowledge of premature birth.  Current Allergies, Complete Past Medical History, Past Surgical History, Family History, and Social History were reviewed in [employer] record.  ROS  The following are not active complaints unless bolded Hoarseness, sore throat, dysphagia, dental problems, itching, sneezing,  nasal congestion or discharge of excess mucus or purulent secretions, ear ache,   fever, chills, sweats, unintended wt loss or wt gain, classically pleuritic or exertional cp,  orthopnea pnd or arm/hand swelling  or leg swelling, presyncope, palpitations, abdominal pain, anorexia, nausea, vomiting, diarrhea  or change in bowel habits or change in bladder habits, change in stools or change in urine, dysuria, hematuria,  rash, arthralgias, visual complaints, headache, numbness, weakness or ataxia or problems with walking or coordination,  change in mood or  memory.        Current Meds - - NOTE:   Unable to verify as accurately reflecting what pt takes    Medication Sig   albuterol  (PROVENTIL ) (2.5 MG/3ML) 0.083% nebulizer solution Up to every 4 hours as needed   albuterol  (VENTOLIN  HFA) 108 (90 Base) MCG/ACT inhaler Inhale 1-2 puffs into the lungs every 4 (four) hours as needed for shortness of breath or wheezing.   ALPRAZolam  (XANAX ) 0.25 MG tablet Take 0.25 mg by mouth daily as needed for anxiety or sleep.   Ascorbic Acid (VITAMIN C) 100 MG tablet Take 100 mg by mouth daily.   clopidogrel  (PLAVIX ) 75 MG tablet TAKE (1) TABLET BY MOUTH ONCE DAILY.   ferrous sulfate  324 MG TBEC Take 324 mg by mouth daily  with breakfast.   ondansetron  (ZOFRAN ) 4 MG tablet Take 1 tablet (4 mg total) by mouth every 6 (six) hours as needed for nausea.   pantoprazole  (PROTONIX ) 40 MG tablet TAKE ONE TABLET BY MOUTH 2 TIMES A DAY   rosuvastatin  (CRESTOR ) 10 MG tablet Take 1 tablet (10 mg total) by mouth daily.   umeclidinium-vilanterol (ANORO ELLIPTA ) 62.5-25 MCG/ACT AEPB Inhale 1 puff into the lungs daily.   vitamin B-12 (CYANOCOBALAMIN ) 100 MCG tablet Take  100 mcg by mouth daily.   VITAMIN D, CHOLECALCIFEROL, PO Take 1 tablet by mouth daily. One daily           Past Medical History:  Diagnosis Date   Anxiety    Occasional   Arthritis    COPD (chronic obstructive pulmonary disease) (HCC)    HTN (hypertension)      Objective:    Wts  11/20/2023       132  08/15/2023       127  06/27/2023         131  12/13/2022       131  07/10/2022       125  02/14/2022     117   01/05/22 119 lb 6.4 oz (54.2 kg)  01/05/22 119 lb 12.8 oz (54.3 kg)  12/24/21 115 lb 3.2 oz (52.3 kg)    Vital signs reviewed  11/20/2023  - Note at rest 02 sats  90% on RA   General appearance:    elderly amb wf easily confused with details of care / names of meds and equipment/    HEENT : Oropharynx  clear    NECK :  without  apparent JVD/ palpable Nodes/TM    LUNGS: no acc muscle use,  Mild barrel  contour chest wall with bilateral  Distant bs s audible wheeze and  without cough on insp or exp maneuvers  and mild  Hyperresonant  to  percussion bilaterally     CV:  RRR  no s3 or murmur or increase in P2, and no edema   ABD:  soft and nontender with pos end  insp Hoover's  in the supine position.  No bruits or organomegaly appreciated   MS:  Nl gait/ ext warm without deformities Or obvious joint restrictions  calf tenderness, cyanosis  - Mild  clubbing     SKIN: warm and dry without lesions    NEURO:  alert, approp, nl sensorium with  no motor or cerebellar deficits apparent.              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  - referred to pulmonary rehab 07/10/2022 > tol well as  of 12/13/2022  - 12/13/2022 changed trelegy to anoro due to wt gain on trelegy  - PFT's  08/27/23  FEV1 0.85 (38 % ) ratio 0.42  p 12 % improvement from saba p Anoro prior to study with DLCO  7.24 (37%)   and FV curve classically concave    Pt is Group B in terms of symptom/risk and laba/lama therefore appropriate rx at this point >>>  anoro best choice but not using saba as per last ABC plan - see avs for instructions unique to this ov re optimal use of saba    Bronchiectasis with MPNs LDSCT  12/28/21  Bilateral bronchiectasis and scattered areas of mucous plugging. Moderate centrilobular emphysema. New consolidation of the lingula. - Flutter valve added 07/10/2022  -  LDSCT  10/17/23  Waxing and waning areas of bronchial wall thickening and mucous plugging with new areas of branching tree-in-bud nodularity in the right lower lobe, findings are most consistent with an infectious or inflammatory bronchiolitis  >>>> continue flutter valve / max mucinex as clinically  doing ine on conservative rx  Comment:  This is an extremely common benign condition in the elderly and does not warrant aggressive eval/ rx at this point unless there is a clinical correlation suggesting unaddressed pulmonary infection (purulent sputum, night sweats, unintended wt loss, doe) or evolution of  obvious changes on plain cxr (as opposed to serial CT, which is way over sensitive to make clinical decisions re intervention and treatment in the elderly, who tend to tolerate both dx and treatment poorly) .  In fact, in study published July 2024 in [facility],  patients with bronchiectasis with vs without proven atypical TB had  similar outcomes x 5 years of the study, in temis of admits/loss of lung function, exac and death.    Chronic respiratory failure with hypoxia (HCC) Started on 02 12/23/21 2lpm hs and prn by Hospitalist - 01/05/2022 patient walked at a slow pace on room air x 3 laps each 150 ft. . Desat to 87% at end of third lap and reported legs being tired on lap 2. SOB on last lap -  02/14/2022   Walked on RA  x  3  lap(s) =  approx 450  ft  @ mod pace, stopped due to end of study s sob  with lowest 02 sats 94%   - 11/20/2023   Walked on RA  x  3  lap(s) =  approx 450  ft  @ mod pace, stopped due to desats to 87% p 300 ft then placed on 2lpm and completed another 150 ft with lowest sats 92% and much less sob.  Advised again on target sats of > 90% and she has portable equipment / monitoring she should use to stay as active as possible - see avs for instructions unique to this ov    Former smoker Quit smoking 05/29/21  - referred to lung cancer screening program 06/13/2021 >>>   Rec continue with LCS program but advised very likely she will continue to have nodules from bronchiectasis/ MAI > false pos  that wax and wane and has bee the case to date  Discussed in detail all the  indications, usual  risks and alternatives  relative to the benefits with patient who agrees to proceed with w/u as outlined.     Each resp medication was reviewed in detail including emphasizing most importantly the difference between maintenance and prns and under what circumstances the prns are to be triggered using an action plan format where appropriate.  Total time for H and P, chart review, counseling, reviewing dpi/hfa/neb/ 02 / pulse ox  device(s) , directly observing portions of ambulatory 02 saturation study/ and generating customized AVS unique to this office visit / same day charting = 30 min complex pt eval.                 AVS  Patient Instructions  Plan A = Automatic = Always=    Anoro one click each am   Plan B  = Backup (to supplement plan A, not to replace it) Use your albuterol  inhaler as a rescue medication to be used if you can't catch your breath by resting or slowing your pace  or doing a relaxed purse lip breathing pattern.  - The less you use it, the better it will work when you need it. - Ok to use the inhaler up to 2 puffs  every 4 hours if you must but call for appointment if use goes up over your usual need - Don't  leave home without it !!  (think of it like the spare tire or starter fluid for your car)   Plan C = Crisis (instead of Plan B but only if Plan B stops working) - only use your albuterol  nebulizer if you first try Plan B and it fails to help > ok to use the nebulizer up to every 4 hours but if start needing it regularly call for immediate appointment   Also  Ok to try albuterol  15 min before an activity (on alternating days)  that you know would usually make you short of breath and see if it makes any difference and if makes none then don't take albuterol  after activity unless you can't catch your breath as this means it's the resting that helps, not the albuterol .      Make sure you check your oxygen  saturation  AT  your highest level of activity (not after you stop)   to be sure it stays over 90% and adjust  02 flow upward to maintain this level if needed but remember to turn it back to previous settings when you stop (to conserve your supply).    Please schedule a follow up visit in 6 months but call sooner if needed    Ozell America, MD 11/24/2023

## 2023-11-20 ENCOUNTER — Encounter: Payer: Self-pay | Admitting: Internal Medicine

## 2023-11-20 ENCOUNTER — Ambulatory Visit (INDEPENDENT_AMBULATORY_CARE_PROVIDER_SITE_OTHER): Admitting: Internal Medicine

## 2023-11-20 VITALS — BP 164/82 | HR 87 | Ht 65.0 in | Wt 132.2 lb

## 2023-11-20 DIAGNOSIS — J449 Chronic obstructive pulmonary disease, unspecified: Secondary | ICD-10-CM

## 2023-11-20 DIAGNOSIS — J479 Bronchiectasis, uncomplicated: Secondary | ICD-10-CM | POA: Diagnosis not present

## 2023-11-20 DIAGNOSIS — J9611 Chronic respiratory failure with hypoxia: Secondary | ICD-10-CM | POA: Diagnosis not present

## 2023-11-20 DIAGNOSIS — Z87891 Personal history of nicotine dependence: Secondary | ICD-10-CM

## 2023-11-20 NOTE — Assessment & Plan Note (Addendum)
 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  - referred to pulmonary rehab 07/10/2022 > tol well as  of 12/13/2022  - 12/13/2022 changed trelegy to anoro due to wt gain on trelegy  - PFT's  08/27/23  FEV1 0.85 (38 % ) ratio 0.42  p 12 % improvement from saba p Anoro prior to study with DLCO  7.24 (37%)   and FV curve classically concave    Pt is Group B in terms of symptom/risk and laba/lama therefore appropriate rx at this point >>>  anoro best choice but not using saba as per last ABC plan - see avs for instructions unique to this ov re optimal use of saba

## 2023-11-20 NOTE — Patient Instructions (Signed)
 Plan A = Automatic = Always=    Anoro one click each am   Plan B = Backup (to supplement plan A, not to replace it) Use your albuterol  inhaler as a rescue medication to be used if you can't catch your breath by resting or slowing your pace  or doing a relaxed purse lip breathing pattern.  - The less you use it, the better it will work when you need it. - Ok to use the inhaler up to 2 puffs  every 4 hours if you must but call for appointment if use goes up over your usual need - Don't leave home without it !!  (think of it like the spare tire or starter fluid for your car)   Plan C = Crisis (instead of Plan B but only if Plan B stops working) - only use your albuterol  nebulizer if you first try Plan B and it fails to help > ok to use the nebulizer up to every 4 hours but if start needing it regularly call for immediate appointment   Also  Ok to try albuterol  15 min before an activity (on alternating days)  that you know would usually make you short of breath and see if it makes any difference and if makes none then don't take albuterol  after activity unless you can't catch your breath as this means it's the resting that helps, not the albuterol .      Make sure you check your oxygen  saturation  AT  your highest level of activity (not after you stop)   to be sure it stays over 90% and adjust  02 flow upward to maintain this level if needed but remember to turn it back to previous settings when you stop (to conserve your supply).    Please schedule a follow up visit in 6 months but call sooner if needed

## 2023-11-24 NOTE — Assessment & Plan Note (Addendum)
 Started on 02 12/23/21 2lpm hs and prn by Hospitalist - 01/05/2022 patient walked at a slow pace on room air x 3 laps each 150 ft. . Desat to 87% at end of third lap and reported legs being tired on lap 2. SOB on last lap -  02/14/2022   Walked on RA  x  3  lap(s) =  approx 450  ft  @ mod pace, stopped due to end of study s sob  with lowest 02 sats 94%   - 11/20/2023   Walked on RA  x  3  lap(s) =  approx 450  ft  @ mod pace, stopped due to desats to 87% p 300 ft then placed on 2lpm and completed another 150 ft with lowest sats 92% and much less sob.  Advised again on target sats of > 90% and she has portable equipment / monitoring she should use to stay as active as possible - see avs for instructions unique to this ov

## 2023-11-24 NOTE — Assessment & Plan Note (Addendum)
 Quit smoking 05/29/21  - referred to lung cancer screening program 06/13/2021 >>>   Rec continue with LCS program but advised very likely she will continue to have nodules from bronchiectasis/ MAI > false pos  that wax and wane and has bee the case to date  Discussed in detail all the  indications, usual  risks and alternatives  relative to the benefits with patient who agrees to proceed with w/u as outlined.     Each resp medication was reviewed in detail including emphasizing most importantly the difference between maintenance and prns and under what circumstances the prns are to be triggered using an action plan format where appropriate.  Total time for H and P, chart review, counseling, reviewing dpi/hfa/neb/ 02 / pulse ox  device(s) , directly observing portions of ambulatory 02 saturation study/ and generating customized AVS unique to this office visit / same day charting = 30 min complex pt eval.

## 2023-11-24 NOTE — Assessment & Plan Note (Addendum)
 LDSCT  12/28/21  Bilateral bronchiectasis and scattered areas of mucous plugging. Moderate centrilobular emphysema. New consolidation of the lingula. - Flutter valve added 07/10/2022  -  LDSCT  10/17/23  Waxing and waning areas of bronchial wall thickening and mucous plugging with new areas of branching tree-in-bud nodularity in the right lower lobe, findings are most consistent with an infectious or inflammatory bronchiolitis  >>>> continue flutter valve / max mucinex as clinically doing ine on conservative rx  Comment:  This is an extremely common benign condition in the elderly and does not warrant aggressive eval/ rx at this point unless there is a clinical correlation suggesting unaddressed pulmonary infection (purulent sputum, night sweats, unintended wt loss, doe) or evolution of  obvious changes on plain cxr (as opposed to serial CT, which is way over sensitive to make clinical decisions re intervention and treatment in the elderly, who tend to tolerate both dx and treatment poorly) .  In fact, in study published July 2024 in [facility],  patients with bronchiectasis with vs without proven atypical TB had similar outcomes x 5 years of the study, in temis of admits/loss of lung function, exac and death.

## 2023-11-26 DIAGNOSIS — M549 Dorsalgia, unspecified: Secondary | ICD-10-CM | POA: Diagnosis not present

## 2023-11-26 DIAGNOSIS — R293 Abnormal posture: Secondary | ICD-10-CM | POA: Diagnosis not present

## 2023-11-26 DIAGNOSIS — M6281 Muscle weakness (generalized): Secondary | ICD-10-CM | POA: Diagnosis not present

## 2023-11-26 DIAGNOSIS — M2560 Stiffness of unspecified joint, not elsewhere classified: Secondary | ICD-10-CM | POA: Diagnosis not present

## 2023-11-28 DIAGNOSIS — M6281 Muscle weakness (generalized): Secondary | ICD-10-CM | POA: Diagnosis not present

## 2023-11-28 DIAGNOSIS — R293 Abnormal posture: Secondary | ICD-10-CM | POA: Diagnosis not present

## 2023-11-28 DIAGNOSIS — M2560 Stiffness of unspecified joint, not elsewhere classified: Secondary | ICD-10-CM | POA: Diagnosis not present

## 2023-11-28 DIAGNOSIS — M549 Dorsalgia, unspecified: Secondary | ICD-10-CM | POA: Diagnosis not present

## 2023-12-03 DIAGNOSIS — M549 Dorsalgia, unspecified: Secondary | ICD-10-CM | POA: Diagnosis not present

## 2023-12-03 DIAGNOSIS — M6281 Muscle weakness (generalized): Secondary | ICD-10-CM | POA: Diagnosis not present

## 2023-12-03 DIAGNOSIS — M2560 Stiffness of unspecified joint, not elsewhere classified: Secondary | ICD-10-CM | POA: Diagnosis not present

## 2023-12-03 DIAGNOSIS — R293 Abnormal posture: Secondary | ICD-10-CM | POA: Diagnosis not present

## 2023-12-05 DIAGNOSIS — M549 Dorsalgia, unspecified: Secondary | ICD-10-CM | POA: Diagnosis not present

## 2023-12-05 DIAGNOSIS — M6281 Muscle weakness (generalized): Secondary | ICD-10-CM | POA: Diagnosis not present

## 2023-12-05 DIAGNOSIS — R293 Abnormal posture: Secondary | ICD-10-CM | POA: Diagnosis not present

## 2023-12-05 DIAGNOSIS — M2560 Stiffness of unspecified joint, not elsewhere classified: Secondary | ICD-10-CM | POA: Diagnosis not present

## 2023-12-11 DIAGNOSIS — R293 Abnormal posture: Secondary | ICD-10-CM | POA: Diagnosis not present

## 2023-12-11 DIAGNOSIS — M549 Dorsalgia, unspecified: Secondary | ICD-10-CM | POA: Diagnosis not present

## 2023-12-11 DIAGNOSIS — M2560 Stiffness of unspecified joint, not elsewhere classified: Secondary | ICD-10-CM | POA: Diagnosis not present

## 2023-12-11 DIAGNOSIS — M6281 Muscle weakness (generalized): Secondary | ICD-10-CM | POA: Diagnosis not present

## 2023-12-13 DIAGNOSIS — R293 Abnormal posture: Secondary | ICD-10-CM | POA: Diagnosis not present

## 2023-12-13 DIAGNOSIS — M549 Dorsalgia, unspecified: Secondary | ICD-10-CM | POA: Diagnosis not present

## 2023-12-13 DIAGNOSIS — M2560 Stiffness of unspecified joint, not elsewhere classified: Secondary | ICD-10-CM | POA: Diagnosis not present

## 2023-12-13 DIAGNOSIS — M6281 Muscle weakness (generalized): Secondary | ICD-10-CM | POA: Diagnosis not present

## 2023-12-17 DIAGNOSIS — M2560 Stiffness of unspecified joint, not elsewhere classified: Secondary | ICD-10-CM | POA: Diagnosis not present

## 2023-12-17 DIAGNOSIS — M549 Dorsalgia, unspecified: Secondary | ICD-10-CM | POA: Diagnosis not present

## 2023-12-17 DIAGNOSIS — M6281 Muscle weakness (generalized): Secondary | ICD-10-CM | POA: Diagnosis not present

## 2023-12-17 DIAGNOSIS — R293 Abnormal posture: Secondary | ICD-10-CM | POA: Diagnosis not present

## 2023-12-18 ENCOUNTER — Ambulatory Visit (INDEPENDENT_AMBULATORY_CARE_PROVIDER_SITE_OTHER): Admitting: Internal Medicine

## 2023-12-18 ENCOUNTER — Encounter: Payer: Self-pay | Admitting: Internal Medicine

## 2023-12-18 VITALS — BP 170/69 | HR 75 | Temp 97.1°F | Ht 64.5 in | Wt 132.1 lb

## 2023-12-18 DIAGNOSIS — R195 Other fecal abnormalities: Secondary | ICD-10-CM | POA: Diagnosis not present

## 2023-12-18 DIAGNOSIS — Z8719 Personal history of other diseases of the digestive system: Secondary | ICD-10-CM

## 2023-12-18 DIAGNOSIS — R151 Fecal smearing: Secondary | ICD-10-CM | POA: Diagnosis not present

## 2023-12-18 DIAGNOSIS — D500 Iron deficiency anemia secondary to blood loss (chronic): Secondary | ICD-10-CM

## 2023-12-18 DIAGNOSIS — K31819 Angiodysplasia of stomach and duodenum without bleeding: Secondary | ICD-10-CM

## 2023-12-18 NOTE — Progress Notes (Signed)
 Referring Provider: Loreli Elsie JONETTA Mickey., MD Primary Care Physician:  Loreli Elsie JONETTA Mickey., MD Primary GI:  Dr. Cindie  Chief Complaint  Patient presents with   Follow-up    Patient here today for a follow up on IDA. Patient says she has seen dark stools, but she is taking po fe daily. She has not seen any bright red blood per rectum, no abdominal pain, constipation or diarrhea. Patient says she is having some looser stool since starting a new medication Rosuvastatin  10 mg daily for her cholesterol.     HPI:   Desiree Barber is a 73 y.o. female who presents to clinic today for follow up visit.   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 Pylori -Esophageal biopsies consistent with Barrett's -Advised repeat EGD in 5 years for surveillance   Colonoscopy 08/27/22: -Nonbleeding internal hemorrhoids -Pancolonic diverticulosis -Advised repeat in 10 years for screening   Given positive H. pylori on pathology she was treated with bismuth  quadruple (metronidazole , tetracycline ) therapy with PPI briefly increased to twice daily.  Sunsequent H pylori breath test positive. Retreated with salvage therapy with levofloxacin /amoxicillin .  Underwent capsule endoscopy 01/29/2023 which showed multiple angiodysplastic lesions /angioectasia's and erosions present throughout proximal and mid small bowel without active bleeding.  Small bowel enteroscopy 03/25/2023 showed small hiatal hernias, 2 angioectasias in the stomach treated with APC, 7 angioectasias in the duodenum treated with APC.  Most recent CBC, iron  studies from 09/25/23 normal.    History of small bowel obstruction: Admitted to [facility]23 after initially  presenting with abdominal pain nausea, abdominal distention.  In the ER CT abdomen pelvis showed small bowel obstruction with transition point in the distal ileum.  Also admitted for acute on chronic respiratory failure in the setting of sepsis/pneumonia.  General surgery was consulted due to small bowel obstruction which resolved with conservative measures.  During her hospitalization, she had a significant rise in her aminotransferases up to as high as AST 2912, ALT 1819, alk phos 149, T. bili normal.  On day of discharge, AST 41, ALT 455, alk phos 78, T. bili 0.9.  Likely due to shock liver.  These have completely normalized.  Fecal seepage, loose stools: On previous visit was complaining about stool leakage.  Happens intermittently small amount of stool in her underwear.  Also notes anytime she has to urinate a small amount of stool will also come out.  Denies any rectal pain or discomfort.  No melena hematochezia.  Kegel exercises printed off on prior visit.Recommended fiber therapy. She states this is better though since starting Rosuvastatin  which seems like has worsened her stools.   Past Medical History:  Diagnosis Date   Anemia    Anxiety    Occasional   Arthritis    Clotting disorder    COPD (chronic obstructive pulmonary disease) (HCC)    Coronary artery disease 07/2021   Incidental finding on lung CT   HTN (hypertension)    Iron  deficiency anemia due to chronic blood loss 03/28/2022   Peripheral arterial disease     Past Surgical History:  Procedure Laterality Date   ABDOMINAL AORTOGRAM W/LOWER EXTREMITY N/A 04/10/2022   Procedure: ABDOMINAL AORTOGRAM W/LOWER EXTREMITY;  Surgeon: Serene Gaile ORN, MD;  Location: [facility];  Service: Cardiovascular;  Laterality: N/A;   BIOPSY  08/27/2022   Procedure: BIOPSY;  Surgeon: Cindie Carlin POUR, DO;  Location: [facility];  Service: Endoscopy;;   BIOPSY  03/25/2023   Procedure: BIOPSY;  Surgeon: Cindie Carlin POUR, DO;   Location: [facility];  Service: Endoscopy;;   COLONOSCOPY  02/23/04   RMR: normal rectum and colon.minimal internal hemorrhoids   COLONOSCOPY WITH PROPOFOL  N/A 06/13/2015   Procedure: COLONOSCOPY WITH PROPOFOL ;  Surgeon: Lamar CHRISTELLA Hollingshead, MD;  Location: [facility];  Service: Endoscopy;  Laterality: N/A;  1045   COLONOSCOPY WITH PROPOFOL  N/A 08/27/2022   Procedure: COLONOSCOPY WITH PROPOFOL ;  Surgeon: Cindie Carlin POUR, DO;  Location: [facility];  Service: Endoscopy;  Laterality: N/A;  8:45 am, asa 3   ENTEROSCOPY N/A 03/25/2023   Procedure: ENTEROSCOPY;  Surgeon: Cindie Carlin POUR, DO;  Location: [facility];  Service: Endoscopy;  Laterality: N/A;  115pm, asa 3   ESOPHAGOGASTRODUODENOSCOPY (EGD) WITH PROPOFOL  N/A 08/27/2022   Procedure: ESOPHAGOGASTRODUODENOSCOPY (EGD) WITH PROPOFOL ;  Surgeon: Cindie Carlin POUR, DO;  [facility]: [facility];  Service: Endoscopy;  Laterality: N/A;   GIVENS CAPSULE STUDY N/A 01/29/2023   Procedure: GIVENS CAPSULE STUDY;  Surgeon: Cindie Carlin POUR, DO;  [facility]: [facility];  Service: Endoscopy;  Laterality: N/A;  730am   HOT HEMOSTASIS  08/27/2022   Procedure: HOT HEMOSTASIS (ARGON PLASMA COAGULATION/BICAP);  Surgeon: Cindie Carlin POUR, DO;  [facility]: [facility];  Service: Endoscopy;;   HOT HEMOSTASIS  03/25/2023   Procedure: HOT HEMOSTASIS (ARGON PLASMA COAGULATION/BICAP);  Surgeon: Cindie Carlin POUR, DO;  [facility];  Service: Endoscopy;;   POLYPECTOMY  06/13/2015   Procedure: POLYPECTOMY;  Surgeon: Lamar CHRISTELLA Hollingshead, MD;  [facility];  Service: Endoscopy;;  sigmoid colon polyp   TOOTH EXTRACTION     TRIGGER FINGER RELEASE Bilateral    TUBAL LIGATION      Current Outpatient Medications  Medication Sig Dispense Refill   albuterol  (PROVENTIL ) (2.5 MG/3ML) 0.083% nebulizer solution Up to every 4 hours as needed 75 mL 12   albuterol  (VENTOLIN  HFA) 108 (90 Base) MCG/ACT inhaler Inhale 1-2 puffs into the lungs every 4 (four) hours as  needed for shortness of breath or wheezing. 18 g 11   ALPRAZolam  (XANAX ) 0.25 MG tablet Take 0.25 mg by mouth daily as needed for anxiety or sleep.     Ascorbic Acid (VITAMIN C) 100 MG tablet Take 100 mg by mouth daily.     clopidogrel  (PLAVIX ) 75 MG tablet TAKE (1) TABLET BY MOUTH ONCE DAILY. 30 tablet 11   ferrous sulfate  324 MG TBEC Take 324 mg by mouth daily with breakfast.     ondansetron  (ZOFRAN ) 4 MG tablet Take 1 tablet (4 mg total) by mouth every 6 (six) hours as needed for nausea. 20 tablet 0   pantoprazole  (PROTONIX ) 40 MG tablet TAKE ONE TABLET BY MOUTH 2 TIMES A DAY (Patient taking differently: Take 40 mg by mouth daily.) 60 tablet 11   rosuvastatin  (CRESTOR ) 10 MG tablet Take 1 tablet (10 mg total) by mouth daily. 90 tablet 3   umeclidinium-vilanterol (ANORO ELLIPTA ) 62.5-25 MCG/ACT AEPB Inhale 1 puff into the lungs daily. 1 each 11   vitamin B-12 (CYANOCOBALAMIN ) 100 MCG tablet Take 100 mcg by mouth daily.     VITAMIN D, CHOLECALCIFEROL, PO Take 1 tablet by mouth daily. One daily     No current facility-administered medications for this visit.    Allergies as of 12/18/2023 -  Review Complete 12/18/2023  Allergen Reaction Noted   Demerol [meperidine] Nausea And Vomiting 05/03/2015   Oxycontin  [oxycodone  hcl] Nausea And Vomiting 05/03/2015   Fish allergy Nausea Only 06/13/2015   Lavender oil Itching 02/28/2022   Other Itching 02/28/2022    Family History  Problem Relation Age of Onset   Heart attack Father    Colon cancer Neg Hx    Breast cancer Neg Hx     Social History   Socioeconomic History   Marital status: Married    Spouse name: Not on file   Number of children: Not on file   Years of education: Not on file   Highest education level: Not on file  Occupational History   Not on file  Tobacco Use   Smoking status: Former    Current packs/day: 0.00    Average packs/day: 0.5 packs/day for 55.0 years (27.5 ttl pk-yrs)    Types: Cigarettes    Start date:  05/30/1966    Quit date: 05/29/2021    Years since quitting: 2.5    Passive exposure: Past   Smokeless tobacco: Former   Tobacco comments:    Quit date 05/29/2021  Vaping Use   Vaping status: Never Used  Substance and Sexual Activity   Alcohol use: Yes    Alcohol/week: 0.0 standard drinks of alcohol    Comment: 3 beers a day   Drug use: No   Sexual activity: Yes    Birth control/protection: Surgical  Other Topics Concern   Not on file  Social History Narrative   Not on file   Social Drivers of Health   Financial Resource Strain: Not on file  Food Insecurity: No Food Insecurity (07/06/2023)   Hunger Vital Sign    Worried About Running Out of Food in the Last Year: Never true    Ran Out of Food in the Last Year: Never true  Transportation Needs: No Transportation Needs (07/06/2023)   PRAPARE - Administrator, Civil Service (Medical): No    Lack of Transportation (Non-Medical): No  Physical Activity: Not on file  Stress: Not on file  Social Connections: Socially Integrated (07/06/2023)   Social Connection and Isolation Panel    Frequency of Communication with Friends and Family: More than three times a week    Frequency of Social Gatherings with Friends and Family: More than three times a week    Attends Religious Services: 1 to 4 times per year    Active Member of [employer] or Organizations: Yes    Attends Banker Meetings: 1 to 4 times per year    Marital Status: Married    Subjective: Review of Systems  Constitutional:  Negative for chills and fever.  HENT:  Negative for congestion and hearing loss.   Eyes:  Negative for blurred vision and double vision.  Respiratory:  Negative for cough and shortness of breath.   Cardiovascular:  Negative for chest pain and palpitations.  Gastrointestinal:  Negative for abdominal pain, blood in stool, constipation, diarrhea, heartburn, melena and vomiting.  Genitourinary:  Negative for dysuria and urgency.   Musculoskeletal:  Negative for joint pain and myalgias.  Skin:  Negative for itching and rash.  Neurological:  Negative for dizziness and headaches.  Psychiatric/Behavioral:  Negative for depression. The patient is not nervous/anxious.      Objective: BP (!) 170/69 (BP Location: Left Arm, Patient Position: Sitting, Cuff Size: Normal)   Pulse 75   Temp (!) 97.1 F (36.2 C) (Temporal)  Ht 5' 4.5 (1.638 m)   Wt 132 lb 1.6 oz (59.9 kg)   BMI 22.32 kg/m  Physical Exam Constitutional:      Appearance: Normal appearance.  HENT:     Head: Normocephalic and atraumatic.  Eyes:     Extraocular Movements: Extraocular movements intact.     Conjunctiva/sclera: Conjunctivae normal.  Cardiovascular:     Rate and Rhythm: Normal rate and regular rhythm.  Pulmonary:     Effort: Pulmonary effort is normal.     Breath sounds: Normal breath sounds.  Abdominal:     General: Bowel sounds are normal.     Palpations: Abdomen is soft.  Musculoskeletal:        General: No swelling. Normal range of motion.     Cervical back: Normal range of motion and neck supple.  Skin:    General: Skin is warm and dry.     Coloration: Skin is not jaundiced.  Neurological:     General: No focal deficit present.     Mental Status: She is alert and oriented to person, place, and time.  Psychiatric:        Mood and Affect: Mood normal.        Behavior: Behavior normal.      Assessment/Plan:  1.  Fecal seepage, loose stools-discussed in depth with patient today.  Kegel exercises helping some, will continue.  Continue on fiber therapy. Start imodium 1 tablet daily. Increase as needed up to 3x a day. Can consider hemorrhoid banding if symptoms not improved.  2.  History of small bowel obstruction-clinically improved.  No nausea or vomiting.  No abdominal pain or distention.  Having regular bowel movements.  I did discuss case further with Dr. Kallie of general surgery who agreed with continued monitoring for  now.  3.  Iron  deficiency anemia, angiectasia's- Patient denies any hematochezia. Dark stools related to iron  therapy. Most recent CBC and iron  levels WNL. Continue to monitor.   4.  Elevated LFTs-likely due to shock liver in the setting of sepsis/PNA.  These have normalized.   Follow-up in 3-4 months.   12/18/2023 10:36 AM   Disclaimer: This note was dictated with voice recognition software. Similar sounding words can inadvertently be transcribed and may not be corrected upon review.

## 2023-12-18 NOTE — Patient Instructions (Addendum)
 For your loose stools and stool leakage, continue performing Kegel exercises at home.    Start taking imodium 1 tablet each morning.    We can consider hemorrhoid banding if not improved.   Follow-up in 3 to 4 months.   It is always a pleasure seeing you.   Dr. Cindie

## 2023-12-20 ENCOUNTER — Ambulatory Visit (HOSPITAL_COMMUNITY)
Admission: RE | Admit: 2023-12-20 | Discharge: 2023-12-20 | Disposition: A | Discharge status: Home / Self Care | Source: Ambulatory Visit | Attending: Acute Care | Admitting: Acute Care

## 2023-12-20 DIAGNOSIS — R911 Solitary pulmonary nodule: Secondary | ICD-10-CM | POA: Insufficient documentation

## 2023-12-20 DIAGNOSIS — Z122 Encounter for screening for malignant neoplasm of respiratory organs: Secondary | ICD-10-CM | POA: Insufficient documentation

## 2023-12-20 DIAGNOSIS — J432 Centrilobular emphysema: Secondary | ICD-10-CM | POA: Diagnosis not present

## 2023-12-20 DIAGNOSIS — Z87891 Personal history of nicotine dependence: Secondary | ICD-10-CM | POA: Insufficient documentation

## 2023-12-20 DIAGNOSIS — J479 Bronchiectasis, uncomplicated: Secondary | ICD-10-CM | POA: Diagnosis not present

## 2023-12-24 DIAGNOSIS — M549 Dorsalgia, unspecified: Secondary | ICD-10-CM | POA: Diagnosis not present

## 2023-12-24 DIAGNOSIS — M6281 Muscle weakness (generalized): Secondary | ICD-10-CM | POA: Diagnosis not present

## 2023-12-24 DIAGNOSIS — R293 Abnormal posture: Secondary | ICD-10-CM | POA: Diagnosis not present

## 2023-12-24 DIAGNOSIS — M2560 Stiffness of unspecified joint, not elsewhere classified: Secondary | ICD-10-CM | POA: Diagnosis not present

## 2023-12-24 IMAGING — CT CT CHEST LUNG CANCER SCREENING LOW DOSE W/O CM
2 of 4 series · 15 of 36 positions shown, 18 images · non-contrast
Comparison: None Available.

CLINICAL DATA: 70-year-old asymptomatic female former smoker with
55 pack-year smoking history, quit smoking in May 2021.



[Series 4: lungs · axial · 0.59mm/px · z∈[-462,-151]mm · 12 of 343 slices shown, 15 images]
[im 16/343  mediastinal]
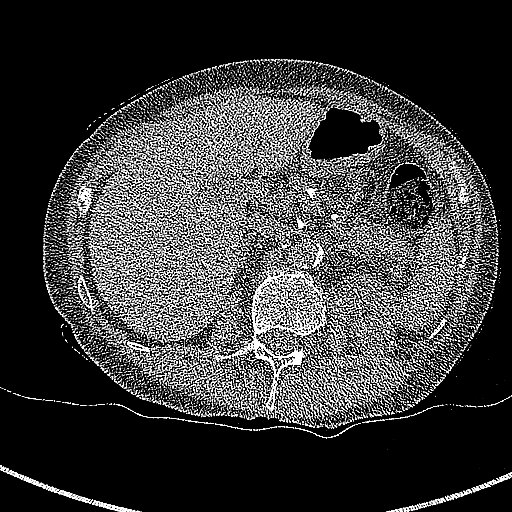
[im 16/343  lung]
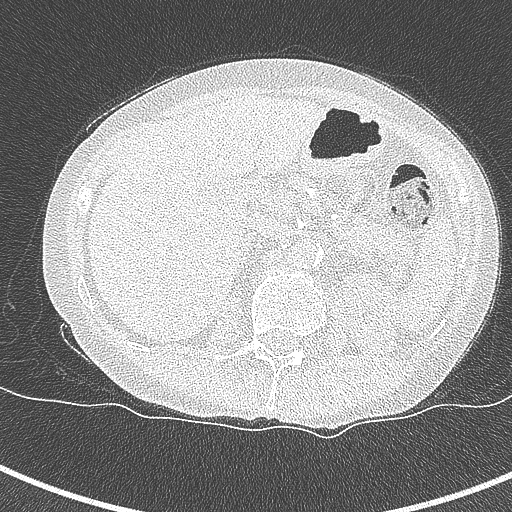
[im 47/343  lung]
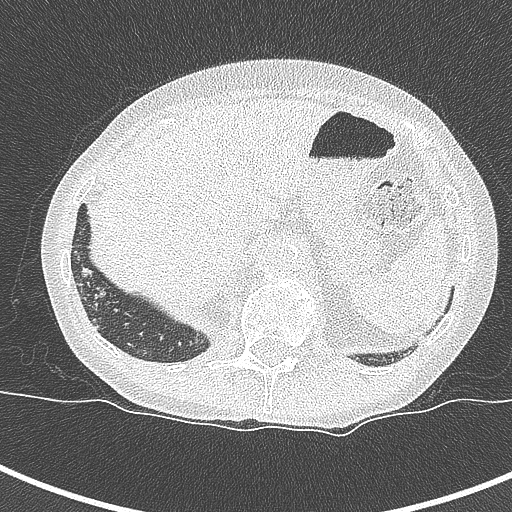
[im 78/343  lung]
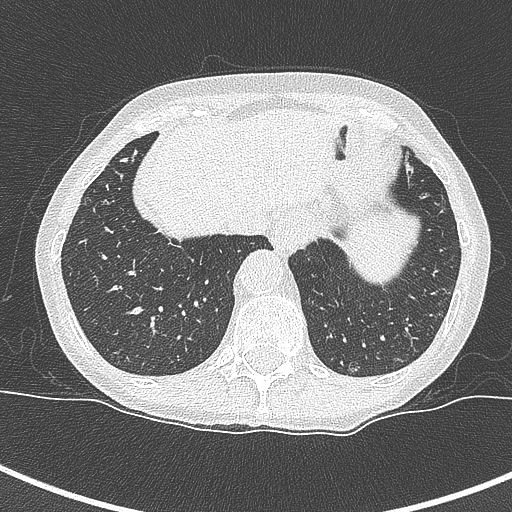
[im 109/343  lung]
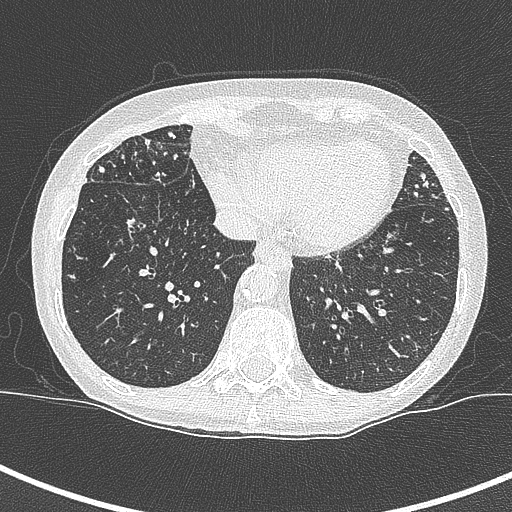
[im 125/343  mediastinal]
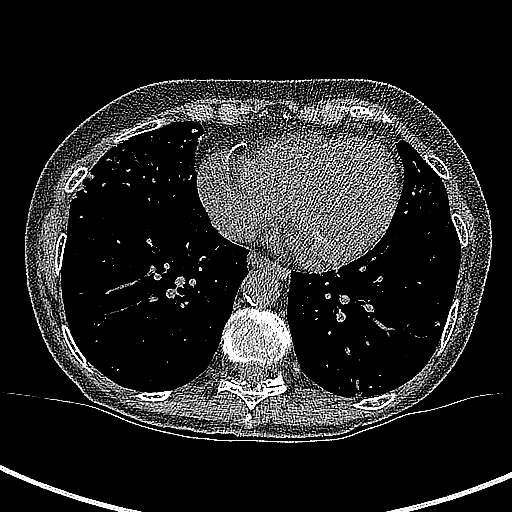
[im 125/343  lung]
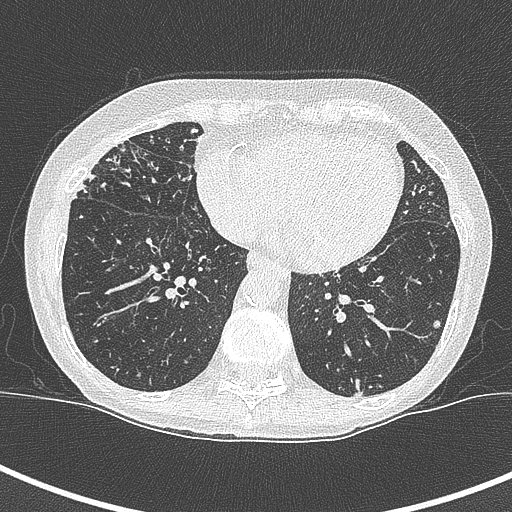
[im 156/343  lung]
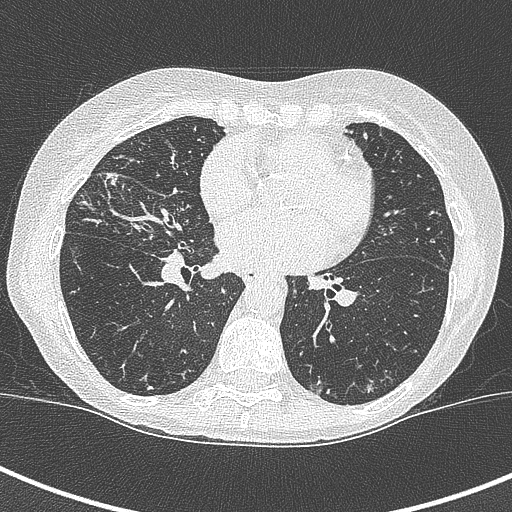
[im 187/343  lung]
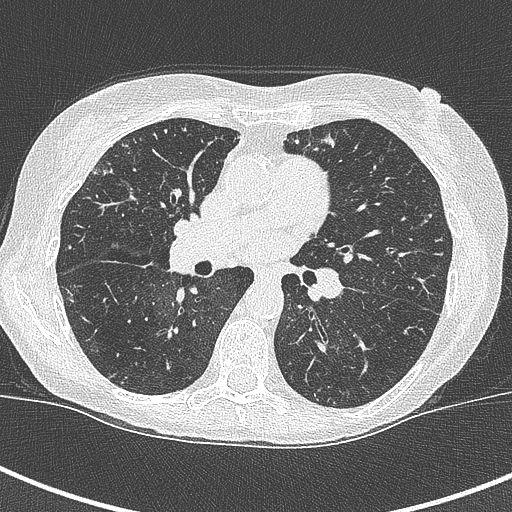
[im 218/343  lung]
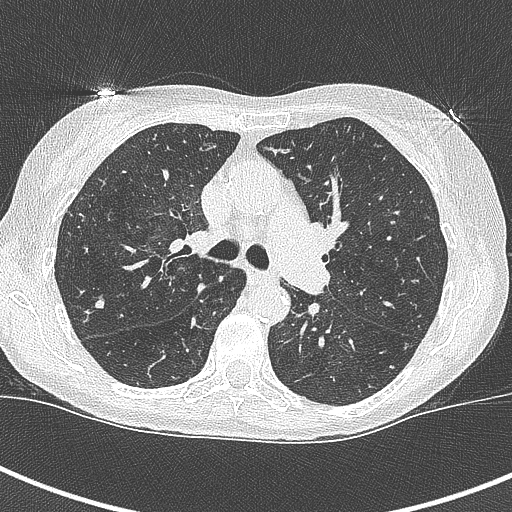
[im 234/343  mediastinal]
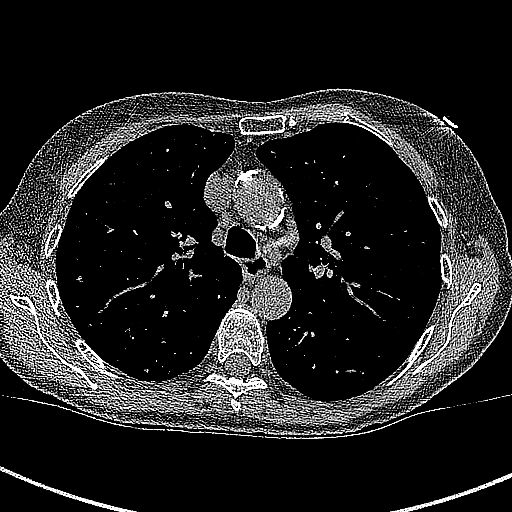
[im 234/343  lung]
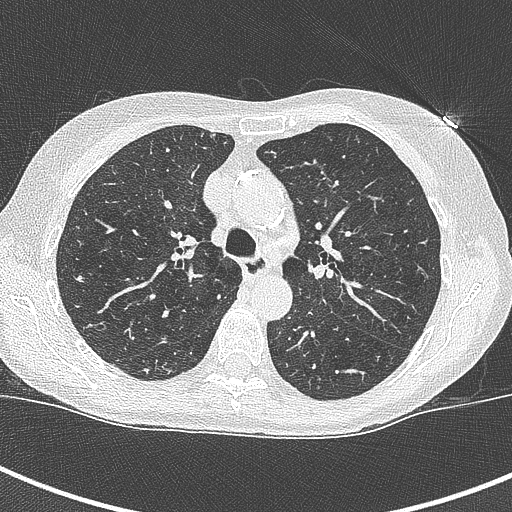
[im 265/343  lung]
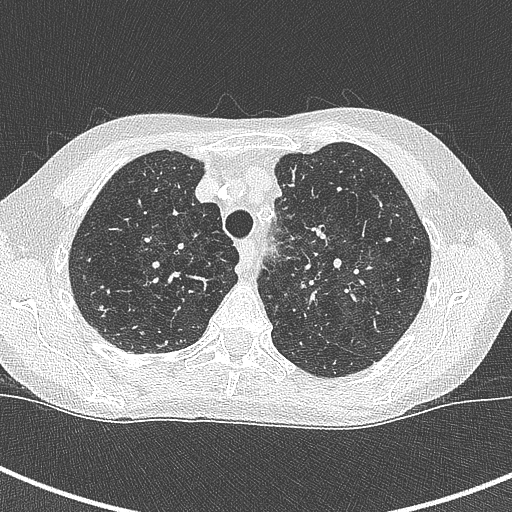
[im 296/343  lung]
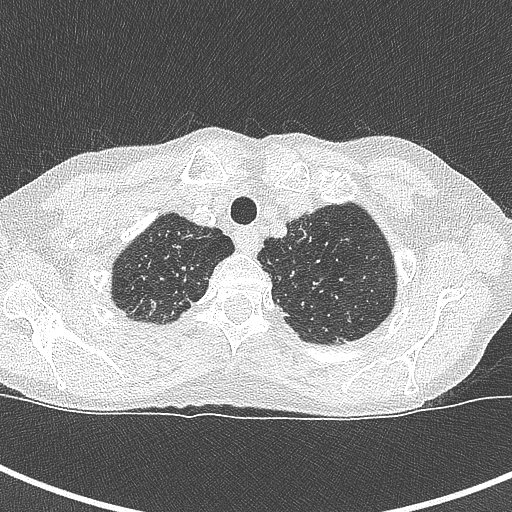
[im 327/343  lung]
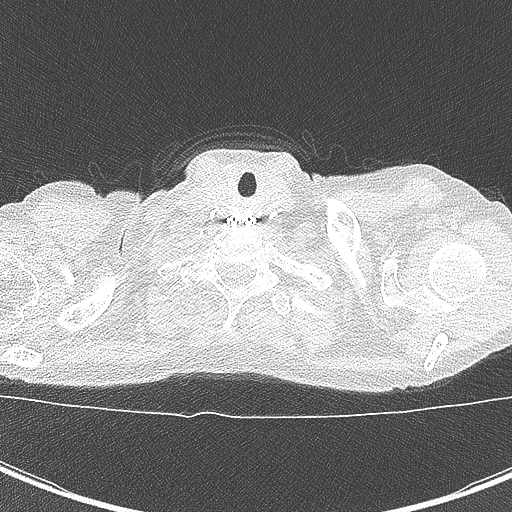

[Series 5: coronal · coronal · 0.65mm/px · 3 of 301 slices shown]
[im 61/301  lung]
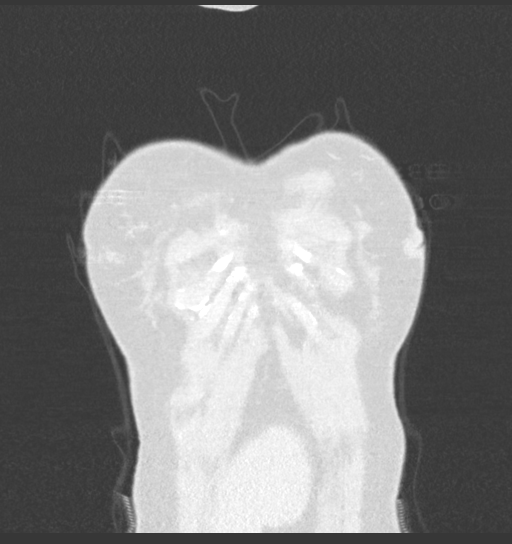
[im 121/301  lung]
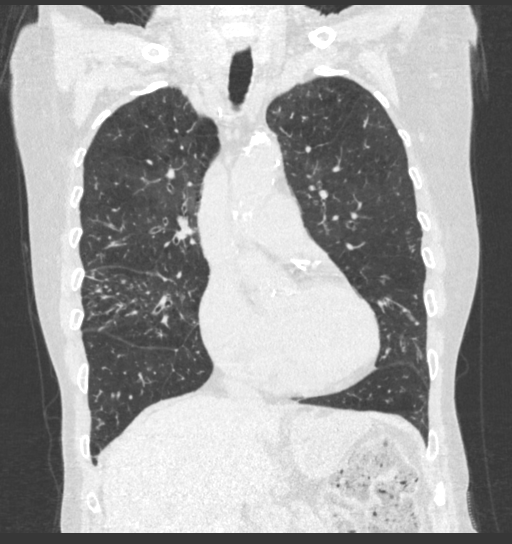
[im 181/301  lung]
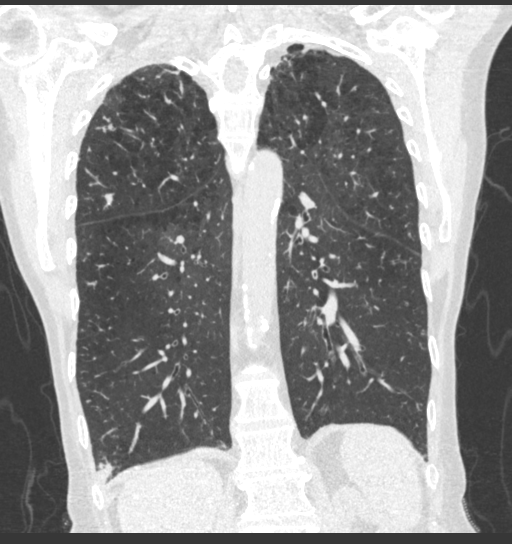

[15 of 36 positions shown; findings below may reference images not displayed]

FINDINGS: Cardiovascular: Normal heart size. No significant pericardial
effusion/thickening. Three-vessel coronary atherosclerosis.
Atherosclerotic nonaneurysmal thoracic aorta. Normal caliber
pulmonary arteries.

Mediastinum/Nodes: No discrete thyroid nodules. Unremarkable
esophagus. No pathologically enlarged axillary, mediastinal or hilar
lymph nodes, noting limited sensitivity for the detection of hilar
adenopathy on this noncontrast study.

Lungs/Pleura: No pneumothorax. No pleural effusion. Moderate
centrilobular emphysema with diffuse bronchial wall thickening. No
acute consolidative airspace disease or lung masses. Moderate
cylindrical bronchiectasis scattered throughout both lungs, most
prominent in the basilar right upper lobe, right middle lobe and
lingula. Innumerable solid pulmonary nodules scattered throughout
both lungs, largest 14.9 mm in volume derived mean diameter in the
subpleural peripheral basilar right lower lobe (series 4/image 308).

Upper abdomen: No acute abnormality.

Musculoskeletal: No aggressive appearing focal osseous lesions. Mild
thoracic spondylosis. Partially visualized surgical hardware from
[facility].
IMPRESSION: 1. Lung-RADS 4A, suspicious. Follow up low-dose chest CT without
contrast in 3 months (please use the following order, "CT CHEST LCS
NODULE FOLLOW-UP W/O CM") is recommended. Innumerable solid
pulmonary nodules scattered throughout both lungs, largest 14.9 mm
in volume derived mean diameter in the subpleural peripheral basilar
right lower lobe, potentially inflammatory given widespread moderate
cylindrical bronchiectasis.
2. Three-vessel coronary atherosclerosis.
3. Aortic Atherosclerosis (BVEFA-H9D.D) and Emphysema (BVEFA-E80.8).

These results will be called to the ordering clinician or
representative by the Radiologist Assistant, and communication
documented in the PACS or [REDACTED].

## 2023-12-25 ENCOUNTER — Ambulatory Visit

## 2023-12-25 ENCOUNTER — Encounter (HOSPITAL_COMMUNITY)

## 2023-12-30 ENCOUNTER — Telehealth: Payer: Self-pay

## 2023-12-30 NOTE — Telephone Encounter (Signed)
 Results reviewed by Ruthell, NP. Please call patient and review results. Please advise patient she will need an annual follow up. She should continue to follow up with Dr. Darlean for chronic bronchiectasis. Place order. Results and plan to PCP.   IMPRESSION: 1. Lung-RADS 2, benign appearance or behavior. Continue annual screening with low-dose chest CT without contrast in 12 months. 2. Chronic bronchiectasis, mucoid impaction and tree-in-bud opacities favoring atypical mycobacterial infection (MAI). Persistent bandlike scarring versus atelectasis in the right middle lobe. 3. Three-vessel coronary atherosclerosis. 4. Small hiatal hernia. 5. Aortic Atherosclerosis (ICD10-I70.0) and Emphysema (ICD10-J43.9).

## 2023-12-31 ENCOUNTER — Other Ambulatory Visit: Payer: Self-pay

## 2023-12-31 DIAGNOSIS — Z87891 Personal history of nicotine dependence: Secondary | ICD-10-CM

## 2023-12-31 DIAGNOSIS — Z122 Encounter for screening for malignant neoplasm of respiratory organs: Secondary | ICD-10-CM

## 2023-12-31 NOTE — Telephone Encounter (Signed)
 Result letter sent to patient's mychart. Order placed. Results and plan to PCP and Dr. Darlean.

## 2024-01-07 DIAGNOSIS — R293 Abnormal posture: Secondary | ICD-10-CM | POA: Diagnosis not present

## 2024-01-07 DIAGNOSIS — M2560 Stiffness of unspecified joint, not elsewhere classified: Secondary | ICD-10-CM | POA: Diagnosis not present

## 2024-01-07 DIAGNOSIS — M549 Dorsalgia, unspecified: Secondary | ICD-10-CM | POA: Diagnosis not present

## 2024-01-07 DIAGNOSIS — M6281 Muscle weakness (generalized): Secondary | ICD-10-CM | POA: Diagnosis not present

## 2024-01-10 DIAGNOSIS — R293 Abnormal posture: Secondary | ICD-10-CM | POA: Diagnosis not present

## 2024-01-10 DIAGNOSIS — M6281 Muscle weakness (generalized): Secondary | ICD-10-CM | POA: Diagnosis not present

## 2024-01-10 DIAGNOSIS — M549 Dorsalgia, unspecified: Secondary | ICD-10-CM | POA: Diagnosis not present

## 2024-01-10 DIAGNOSIS — M2560 Stiffness of unspecified joint, not elsewhere classified: Secondary | ICD-10-CM | POA: Diagnosis not present

## 2024-01-13 DIAGNOSIS — M549 Dorsalgia, unspecified: Secondary | ICD-10-CM | POA: Diagnosis not present

## 2024-01-13 DIAGNOSIS — M2560 Stiffness of unspecified joint, not elsewhere classified: Secondary | ICD-10-CM | POA: Diagnosis not present

## 2024-01-13 DIAGNOSIS — R293 Abnormal posture: Secondary | ICD-10-CM | POA: Diagnosis not present

## 2024-01-13 DIAGNOSIS — M6281 Muscle weakness (generalized): Secondary | ICD-10-CM | POA: Diagnosis not present

## 2024-01-15 DIAGNOSIS — M549 Dorsalgia, unspecified: Secondary | ICD-10-CM | POA: Diagnosis not present

## 2024-01-15 DIAGNOSIS — M6281 Muscle weakness (generalized): Secondary | ICD-10-CM | POA: Diagnosis not present

## 2024-01-15 DIAGNOSIS — R293 Abnormal posture: Secondary | ICD-10-CM | POA: Diagnosis not present

## 2024-01-15 DIAGNOSIS — M2560 Stiffness of unspecified joint, not elsewhere classified: Secondary | ICD-10-CM | POA: Diagnosis not present

## 2024-01-20 DIAGNOSIS — M6281 Muscle weakness (generalized): Secondary | ICD-10-CM | POA: Diagnosis not present

## 2024-01-20 DIAGNOSIS — R293 Abnormal posture: Secondary | ICD-10-CM | POA: Diagnosis not present

## 2024-01-20 DIAGNOSIS — M2560 Stiffness of unspecified joint, not elsewhere classified: Secondary | ICD-10-CM | POA: Diagnosis not present

## 2024-01-20 DIAGNOSIS — M549 Dorsalgia, unspecified: Secondary | ICD-10-CM | POA: Diagnosis not present

## 2024-01-22 DIAGNOSIS — E785 Hyperlipidemia, unspecified: Secondary | ICD-10-CM | POA: Diagnosis not present

## 2024-01-22 DIAGNOSIS — Z1389 Encounter for screening for other disorder: Secondary | ICD-10-CM | POA: Diagnosis not present

## 2024-01-22 DIAGNOSIS — I10 Essential (primary) hypertension: Secondary | ICD-10-CM | POA: Diagnosis not present

## 2024-01-22 DIAGNOSIS — D500 Iron deficiency anemia secondary to blood loss (chronic): Secondary | ICD-10-CM | POA: Diagnosis not present

## 2024-01-24 DIAGNOSIS — R293 Abnormal posture: Secondary | ICD-10-CM | POA: Diagnosis not present

## 2024-01-24 DIAGNOSIS — M6281 Muscle weakness (generalized): Secondary | ICD-10-CM | POA: Diagnosis not present

## 2024-01-24 DIAGNOSIS — M549 Dorsalgia, unspecified: Secondary | ICD-10-CM | POA: Diagnosis not present

## 2024-01-24 DIAGNOSIS — M2560 Stiffness of unspecified joint, not elsewhere classified: Secondary | ICD-10-CM | POA: Diagnosis not present

## 2024-01-27 ENCOUNTER — Encounter: Payer: Self-pay | Admitting: Radiology

## 2024-01-27 DIAGNOSIS — D500 Iron deficiency anemia secondary to blood loss (chronic): Secondary | ICD-10-CM | POA: Diagnosis not present

## 2024-01-27 DIAGNOSIS — J479 Bronchiectasis, uncomplicated: Secondary | ICD-10-CM | POA: Diagnosis not present

## 2024-01-27 DIAGNOSIS — I739 Peripheral vascular disease, unspecified: Secondary | ICD-10-CM | POA: Diagnosis not present

## 2024-01-27 DIAGNOSIS — R82998 Other abnormal findings in urine: Secondary | ICD-10-CM | POA: Diagnosis not present

## 2024-01-27 DIAGNOSIS — M545 Low back pain, unspecified: Secondary | ICD-10-CM | POA: Diagnosis not present

## 2024-01-27 DIAGNOSIS — Z1331 Encounter for screening for depression: Secondary | ICD-10-CM | POA: Diagnosis not present

## 2024-01-27 DIAGNOSIS — I10 Essential (primary) hypertension: Secondary | ICD-10-CM | POA: Diagnosis not present

## 2024-01-27 DIAGNOSIS — E785 Hyperlipidemia, unspecified: Secondary | ICD-10-CM | POA: Diagnosis not present

## 2024-01-27 DIAGNOSIS — I2510 Atherosclerotic heart disease of native coronary artery without angina pectoris: Secondary | ICD-10-CM | POA: Diagnosis not present

## 2024-01-27 DIAGNOSIS — Z87891 Personal history of nicotine dependence: Secondary | ICD-10-CM | POA: Diagnosis not present

## 2024-01-27 DIAGNOSIS — J432 Centrilobular emphysema: Secondary | ICD-10-CM | POA: Diagnosis not present

## 2024-01-27 DIAGNOSIS — Z1339 Encounter for screening examination for other mental health and behavioral disorders: Secondary | ICD-10-CM | POA: Diagnosis not present

## 2024-01-27 DIAGNOSIS — Z0000 Encounter for general adult medical examination without abnormal findings: Secondary | ICD-10-CM | POA: Diagnosis not present

## 2024-01-31 DIAGNOSIS — R293 Abnormal posture: Secondary | ICD-10-CM | POA: Diagnosis not present

## 2024-01-31 DIAGNOSIS — M549 Dorsalgia, unspecified: Secondary | ICD-10-CM | POA: Diagnosis not present

## 2024-01-31 DIAGNOSIS — M6281 Muscle weakness (generalized): Secondary | ICD-10-CM | POA: Diagnosis not present

## 2024-01-31 DIAGNOSIS — M2560 Stiffness of unspecified joint, not elsewhere classified: Secondary | ICD-10-CM | POA: Diagnosis not present

## 2024-02-05 DIAGNOSIS — M2560 Stiffness of unspecified joint, not elsewhere classified: Secondary | ICD-10-CM | POA: Diagnosis not present

## 2024-02-05 DIAGNOSIS — M6281 Muscle weakness (generalized): Secondary | ICD-10-CM | POA: Diagnosis not present

## 2024-02-05 DIAGNOSIS — M549 Dorsalgia, unspecified: Secondary | ICD-10-CM | POA: Diagnosis not present

## 2024-02-05 DIAGNOSIS — R293 Abnormal posture: Secondary | ICD-10-CM | POA: Diagnosis not present

## 2024-02-10 DIAGNOSIS — M2560 Stiffness of unspecified joint, not elsewhere classified: Secondary | ICD-10-CM | POA: Diagnosis not present

## 2024-02-10 DIAGNOSIS — R293 Abnormal posture: Secondary | ICD-10-CM | POA: Diagnosis not present

## 2024-02-10 DIAGNOSIS — M549 Dorsalgia, unspecified: Secondary | ICD-10-CM | POA: Diagnosis not present

## 2024-02-10 DIAGNOSIS — M6281 Muscle weakness (generalized): Secondary | ICD-10-CM | POA: Diagnosis not present

## 2024-02-12 DIAGNOSIS — R293 Abnormal posture: Secondary | ICD-10-CM | POA: Diagnosis not present

## 2024-02-12 DIAGNOSIS — M2560 Stiffness of unspecified joint, not elsewhere classified: Secondary | ICD-10-CM | POA: Diagnosis not present

## 2024-02-12 DIAGNOSIS — M6281 Muscle weakness (generalized): Secondary | ICD-10-CM | POA: Diagnosis not present

## 2024-02-12 DIAGNOSIS — M549 Dorsalgia, unspecified: Secondary | ICD-10-CM | POA: Diagnosis not present

## 2024-02-18 ENCOUNTER — Encounter: Payer: Self-pay | Admitting: Internal Medicine

## 2024-02-25 DIAGNOSIS — M2560 Stiffness of unspecified joint, not elsewhere classified: Secondary | ICD-10-CM | POA: Diagnosis not present

## 2024-02-25 DIAGNOSIS — M549 Dorsalgia, unspecified: Secondary | ICD-10-CM | POA: Diagnosis not present

## 2024-02-25 DIAGNOSIS — R293 Abnormal posture: Secondary | ICD-10-CM | POA: Diagnosis not present

## 2024-02-25 DIAGNOSIS — M6281 Muscle weakness (generalized): Secondary | ICD-10-CM | POA: Diagnosis not present

## 2024-03-04 DIAGNOSIS — M6281 Muscle weakness (generalized): Secondary | ICD-10-CM | POA: Diagnosis not present

## 2024-03-04 DIAGNOSIS — M2560 Stiffness of unspecified joint, not elsewhere classified: Secondary | ICD-10-CM | POA: Diagnosis not present

## 2024-03-04 DIAGNOSIS — R293 Abnormal posture: Secondary | ICD-10-CM | POA: Diagnosis not present

## 2024-03-04 DIAGNOSIS — M549 Dorsalgia, unspecified: Secondary | ICD-10-CM | POA: Diagnosis not present

## 2024-03-05 ENCOUNTER — Telehealth: Payer: Self-pay | Admitting: Internal Medicine

## 2024-03-05 NOTE — Telephone Encounter (Signed)
 Left a message to schedule recall appt with Dr. Cindie in Jan.

## 2024-03-11 ENCOUNTER — Ambulatory Visit

## 2024-03-11 ENCOUNTER — Ambulatory Visit (HOSPITAL_COMMUNITY)
Admission: RE | Admit: 2024-03-11 | Discharge: 2024-03-11 | Attending: Physician Assistant | Admitting: Physician Assistant

## 2024-03-11 ENCOUNTER — Other Ambulatory Visit (HOSPITAL_COMMUNITY): Payer: Self-pay | Admitting: Physician Assistant

## 2024-03-11 ENCOUNTER — Ambulatory Visit (HOSPITAL_BASED_OUTPATIENT_CLINIC_OR_DEPARTMENT_OTHER)
Admission: RE | Admit: 2024-03-11 | Discharge: 2024-03-11 | Disposition: A | Discharge status: Home / Self Care | Source: Ambulatory Visit | Attending: Physician Assistant | Admitting: Physician Assistant

## 2024-03-11 VITALS — BP 106/70 | HR 89 | Temp 97.7°F | Wt 136.2 lb

## 2024-03-11 DIAGNOSIS — I739 Peripheral vascular disease, unspecified: Secondary | ICD-10-CM

## 2024-03-11 DIAGNOSIS — Z95828 Presence of other vascular implants and grafts: Secondary | ICD-10-CM | POA: Diagnosis present

## 2024-03-11 LAB — VAS US ABI WITH/WO TBI
Left ABI: 1.09
Right ABI: 0.95

## 2024-03-11 NOTE — Progress Notes (Unsigned)
 Office Note   History of Present Illness   Desiree Barber is a 73 y.o. (09-Sep-1950) female who presents for surveillance of PAD.  She has a history of bilateral kissing common iliac artery stents in January 2024 by Dr. Sheree for disabling claudication.  Her symptoms resolved postintervention.  She returns today for follow-up. Current Outpatient Medications  Medication Sig Dispense Refill   albuterol  (PROVENTIL ) (2.5 MG/3ML) 0.083% nebulizer solution Up to every 4 hours as needed 75 mL 12   albuterol  (VENTOLIN  HFA) 108 (90 Base) MCG/ACT inhaler Inhale 1-2 puffs into the lungs every 4 (four) hours as needed for shortness of breath or wheezing. 18 g 11   ALPRAZolam  (XANAX ) 0.25 MG tablet Take 0.25 mg by mouth daily as needed for anxiety or sleep.     Ascorbic Acid (VITAMIN C) 100 MG tablet Take 100 mg by mouth daily.     clopidogrel  (PLAVIX ) 75 MG tablet TAKE (1) TABLET BY MOUTH ONCE DAILY. 30 tablet 11   ferrous sulfate  324 MG TBEC Take 324 mg by mouth daily with breakfast.     ondansetron  (ZOFRAN ) 4 MG tablet Take 1 tablet (4 mg total) by mouth every 6 (six) hours as needed for nausea. 20 tablet 0   pantoprazole  (PROTONIX ) 40 MG tablet TAKE ONE TABLET BY MOUTH 2 TIMES A DAY (Patient taking differently: Take 40 mg by mouth daily.) 60 tablet 11   rosuvastatin  (CRESTOR ) 10 MG tablet Take 1 tablet (10 mg total) by mouth daily. 90 tablet 3   umeclidinium-vilanterol (ANORO ELLIPTA ) 62.5-25 MCG/ACT AEPB Inhale 1 puff into the lungs daily. 1 each 11   vitamin B-12 (CYANOCOBALAMIN ) 100 MCG tablet Take 100 mcg by mouth daily.     VITAMIN D, CHOLECALCIFEROL, PO Take 1 tablet by mouth daily. One daily     No current facility-administered medications for this visit.    REVIEW OF SYSTEMS (negative unless checked):   Cardiac:  []  Chest pain or chest pressure? []  Shortness of breath upon activity? []  Shortness of breath when lying flat? []  Irregular heart rhythm?  Vascular:  []  Pain in calf,  thigh, or hip brought on by walking? []  Pain in feet at night that wakes you up from your sleep? []  Blood clot in your veins? []  Leg swelling?  Pulmonary:  []  Oxygen  at home? []  Productive cough? []  Wheezing?  Neurologic:  []  Sudden weakness in arms or legs? []  Sudden numbness in arms or legs? []  Sudden onset of difficult speaking or slurred speech? []  Temporary loss of vision in one eye? []  Problems with dizziness?  Gastrointestinal:  []  Blood in stool? []  Vomited blood?  Genitourinary:  []  Burning when urinating? []  Blood in urine?  Psychiatric:  []  Major depression  Hematologic:  []  Bleeding problems? []  Problems with blood clotting?  Dermatologic:  []  Rashes or ulcers?  Constitutional:  []  Fever or chills?  Ear/Nose/Throat:  []  Change in hearing? []  Nose bleeds? []  Sore throat?  Musculoskeletal:  []  Back pain? []  Joint pain? []  Muscle pain?   Physical Examination   Vitals:   03/11/24 1023  BP: 106/70  Pulse: 89  Temp: 97.7 F (36.5 C)  TempSrc: Temporal  Weight: 136 lb 3.2 oz (61.8 kg)   Body mass index is 23.02 kg/m.  General:  WDWN in NAD; vital signs documented above Gait: Not observed HENT: WNL, normocephalic Pulmonary: normal non-labored breathing  Cardiac: Regular Abdomen: soft, NT, no masses Skin: without rashes Vascular Exam/Pulses: Palpable/nonpalpable femoral pulses, palpable/nonpalpable  popliteal pulses, palpable/nonpalpable pedal pulses. Left DP/PT/Peroneal doppler signals. Right DP/PT/Peroneal doppler signals Extremities: without ischemic changes, without gangrene , without cellulitis; without open wounds;  Musculoskeletal: no muscle wasting or atrophy  Neurologic: A&O X 3;  No focal weakness or paresthesias are detected Psychiatric:  The pt has Normal affect.  Non-Invasive Vascular imaging   ABI (03/11/2024) R:  ABI: 0.95 (1.09),  PT: mono DP: bi TBI: Near absent L:  ABI: 1.09 (1.04),  PT: bi DP: bi TBI:  0.42  Aortoiliac Duplex (03/11/2024)    Medical Decision Making   Desiree Barber is a 73 y.o. female who presents for surveillance of PAD  Based on the patient's vascular studies, their ABIs are essentially unchanged since last visit. *** Arterial duplex *** The patient denies any claudication, rest pain, or tissue loss. They have palpable/nonpalpable pedal pulses with *** doppler signals They will continue their *** and follow up with our office in *** months/year with ABIs and ***   Ahmed Holster PA-C [facility] Office: [phone]  Clinic MD: Sheree

## 2024-03-12 ENCOUNTER — Other Ambulatory Visit: Payer: Self-pay | Admitting: *Deleted

## 2024-03-12 DIAGNOSIS — I70213 Atherosclerosis of native arteries of extremities with intermittent claudication, bilateral legs: Secondary | ICD-10-CM

## 2024-03-12 DIAGNOSIS — Z95828 Presence of other vascular implants and grafts: Secondary | ICD-10-CM

## 2024-04-01 ENCOUNTER — Encounter: Payer: Self-pay | Admitting: Internal Medicine

## 2024-04-08 ENCOUNTER — Other Ambulatory Visit (HOSPITAL_COMMUNITY): Payer: Self-pay | Admitting: Internal Medicine

## 2024-04-08 DIAGNOSIS — Z1231 Encounter for screening mammogram for malignant neoplasm of breast: Secondary | ICD-10-CM

## 2024-04-13 ENCOUNTER — Other Ambulatory Visit (HOSPITAL_COMMUNITY): Payer: Self-pay | Admitting: Internal Medicine

## 2024-04-13 DIAGNOSIS — R1084 Generalized abdominal pain: Secondary | ICD-10-CM

## 2024-04-14 ENCOUNTER — Ambulatory Visit (HOSPITAL_COMMUNITY)
Admission: RE | Admit: 2024-04-14 | Discharge: 2024-04-14 | Disposition: A | Discharge status: Home / Self Care | Source: Ambulatory Visit | Attending: Internal Medicine | Admitting: Internal Medicine

## 2024-04-14 ENCOUNTER — Encounter (HOSPITAL_COMMUNITY): Payer: Self-pay

## 2024-04-14 DIAGNOSIS — R1084 Generalized abdominal pain: Secondary | ICD-10-CM | POA: Insufficient documentation

## 2024-04-14 MED ORDER — IOHEXOL 350 MG/ML SOLN
100.0000 mL | Freq: Once | INTRAVENOUS | Status: AC | PRN
  Administered 2024-04-14: 100 mL via INTRAVENOUS

## 2024-04-20 ENCOUNTER — Inpatient Hospital Stay (HOSPITAL_COMMUNITY): Admission: RE | Admit: 2024-04-20 | Source: Ambulatory Visit

## 2024-04-30 ENCOUNTER — Other Ambulatory Visit: Payer: Self-pay | Admitting: Vascular Surgery

## 2024-04-30 DIAGNOSIS — K551 Chronic vascular disorders of intestine: Secondary | ICD-10-CM

## 2024-05-04 ENCOUNTER — Ambulatory Visit (HOSPITAL_COMMUNITY)

## 2024-05-27 ENCOUNTER — Ambulatory Visit (HOSPITAL_COMMUNITY)

## 2024-05-27 ENCOUNTER — Ambulatory Visit: Admitting: Vascular Surgery

## 2024-09-09 ENCOUNTER — Ambulatory Visit (HOSPITAL_COMMUNITY)

## 2024-09-09 ENCOUNTER — Ambulatory Visit
